# Patient Record
Sex: Male | Born: 1960 | Race: Black or African American | Hispanic: No | Marital: Married | Smoking: Never smoker
Health system: Southern US, Community
[De-identification: ages and names within clinical notes are randomized; demographics above are authoritative.]

## PROBLEM LIST (undated history)

## (undated) DIAGNOSIS — M1990 Unspecified osteoarthritis, unspecified site: Secondary | ICD-10-CM

## (undated) DIAGNOSIS — N429 Disorder of prostate, unspecified: Secondary | ICD-10-CM

## (undated) DIAGNOSIS — I10 Essential (primary) hypertension: Secondary | ICD-10-CM

## (undated) DIAGNOSIS — B351 Tinea unguium: Secondary | ICD-10-CM

## (undated) DIAGNOSIS — F1410 Cocaine abuse, uncomplicated: Secondary | ICD-10-CM

## (undated) DIAGNOSIS — E785 Hyperlipidemia, unspecified: Secondary | ICD-10-CM

## (undated) DIAGNOSIS — I5189 Other ill-defined heart diseases: Secondary | ICD-10-CM

## (undated) DIAGNOSIS — B360 Pityriasis versicolor: Secondary | ICD-10-CM

## (undated) DIAGNOSIS — I629 Nontraumatic intracranial hemorrhage, unspecified: Secondary | ICD-10-CM

## (undated) DIAGNOSIS — N529 Male erectile dysfunction, unspecified: Secondary | ICD-10-CM

## (undated) HISTORY — DX: Tinea unguium: B35.1

## (undated) HISTORY — DX: Essential (primary) hypertension: I10

## (undated) HISTORY — DX: Male erectile dysfunction, unspecified: N52.9

## (undated) HISTORY — DX: Disorder of prostate, unspecified: N42.9

## (undated) HISTORY — DX: Pityriasis versicolor: B36.0

## (undated) HISTORY — DX: Cocaine abuse, uncomplicated: F14.10

## (undated) HISTORY — DX: Other ill-defined heart diseases: I51.89

## (undated) HISTORY — DX: Hyperlipidemia, unspecified: E78.5

## (undated) HISTORY — DX: Nontraumatic intracranial hemorrhage, unspecified: I62.9

---

## 1998-02-12 ENCOUNTER — Encounter
Admission: RE | Admit: 1998-02-12 | Discharge: 1998-05-13 | Payer: Self-pay | Admitting: Physical Medicine and Rehabilitation

## 1998-03-20 ENCOUNTER — Encounter: Admission: RE | Admit: 1998-03-20 | Discharge: 1998-03-20 | Payer: Self-pay | Admitting: Internal Medicine

## 1999-01-14 ENCOUNTER — Encounter
Admission: RE | Admit: 1999-01-14 | Discharge: 1999-02-11 | Payer: Self-pay | Admitting: Physical Medicine and Rehabilitation

## 1999-10-07 ENCOUNTER — Emergency Department (HOSPITAL_COMMUNITY): Admission: EM | Admit: 1999-10-07 | Discharge: 1999-10-07 | Payer: Self-pay | Admitting: Emergency Medicine

## 1999-10-08 ENCOUNTER — Encounter: Payer: Self-pay | Admitting: Emergency Medicine

## 1999-10-12 ENCOUNTER — Encounter: Admission: RE | Admit: 1999-10-12 | Discharge: 1999-10-12 | Payer: Self-pay | Admitting: Hematology and Oncology

## 2000-04-09 ENCOUNTER — Encounter: Payer: Self-pay | Admitting: Emergency Medicine

## 2000-04-09 ENCOUNTER — Emergency Department (HOSPITAL_COMMUNITY): Admission: EM | Admit: 2000-04-09 | Discharge: 2000-04-09 | Payer: Self-pay | Admitting: Emergency Medicine

## 2001-01-11 ENCOUNTER — Encounter: Admission: RE | Admit: 2001-01-11 | Discharge: 2001-01-11 | Payer: Self-pay | Admitting: Hematology and Oncology

## 2001-02-08 ENCOUNTER — Encounter: Admission: RE | Admit: 2001-02-08 | Discharge: 2001-02-08 | Payer: Self-pay | Admitting: Internal Medicine

## 2001-03-06 ENCOUNTER — Encounter: Admission: RE | Admit: 2001-03-06 | Discharge: 2001-03-06 | Payer: Self-pay | Admitting: Hematology and Oncology

## 2001-09-28 ENCOUNTER — Encounter: Admission: RE | Admit: 2001-09-28 | Discharge: 2001-09-28 | Payer: Self-pay | Admitting: Internal Medicine

## 2001-10-05 ENCOUNTER — Encounter: Admission: RE | Admit: 2001-10-05 | Discharge: 2001-10-05 | Payer: Self-pay | Admitting: Internal Medicine

## 2001-11-05 ENCOUNTER — Encounter: Admission: RE | Admit: 2001-11-05 | Discharge: 2001-11-05 | Payer: Self-pay | Admitting: Internal Medicine

## 2002-02-12 DIAGNOSIS — B351 Tinea unguium: Secondary | ICD-10-CM

## 2002-02-12 HISTORY — DX: Tinea unguium: B35.1

## 2002-04-11 ENCOUNTER — Encounter: Admission: RE | Admit: 2002-04-11 | Discharge: 2002-04-11 | Payer: Self-pay | Admitting: Internal Medicine

## 2002-04-19 ENCOUNTER — Encounter: Admission: RE | Admit: 2002-04-19 | Discharge: 2002-04-19 | Payer: Self-pay | Admitting: Internal Medicine

## 2002-04-26 ENCOUNTER — Encounter: Admission: RE | Admit: 2002-04-26 | Discharge: 2002-04-26 | Payer: Self-pay | Admitting: Internal Medicine

## 2002-12-05 ENCOUNTER — Encounter: Admission: RE | Admit: 2002-12-05 | Discharge: 2002-12-05 | Payer: Self-pay | Admitting: Internal Medicine

## 2004-07-10 ENCOUNTER — Emergency Department (HOSPITAL_COMMUNITY): Admission: EM | Admit: 2004-07-10 | Discharge: 2004-07-10 | Payer: Self-pay | Admitting: Emergency Medicine

## 2004-07-10 ENCOUNTER — Ambulatory Visit (HOSPITAL_COMMUNITY): Admission: RE | Admit: 2004-07-10 | Discharge: 2004-07-10 | Payer: Self-pay | Admitting: Emergency Medicine

## 2004-07-27 ENCOUNTER — Ambulatory Visit: Payer: Self-pay | Admitting: Internal Medicine

## 2004-11-24 ENCOUNTER — Ambulatory Visit: Payer: Self-pay | Admitting: Internal Medicine

## 2004-12-08 ENCOUNTER — Ambulatory Visit: Payer: Self-pay | Admitting: Internal Medicine

## 2004-12-16 ENCOUNTER — Ambulatory Visit: Payer: Self-pay | Admitting: Internal Medicine

## 2004-12-23 ENCOUNTER — Encounter: Admission: RE | Admit: 2004-12-23 | Discharge: 2005-03-23 | Payer: Self-pay | Admitting: Surgery

## 2005-02-10 ENCOUNTER — Ambulatory Visit: Payer: Self-pay | Admitting: Internal Medicine

## 2005-10-13 ENCOUNTER — Ambulatory Visit (HOSPITAL_COMMUNITY): Admission: RE | Admit: 2005-10-13 | Discharge: 2005-10-13 | Payer: Self-pay | Admitting: Internal Medicine

## 2005-10-13 ENCOUNTER — Ambulatory Visit: Payer: Self-pay | Admitting: Hospitalist

## 2005-10-27 ENCOUNTER — Ambulatory Visit: Payer: Self-pay | Admitting: Internal Medicine

## 2005-11-25 ENCOUNTER — Encounter (INDEPENDENT_AMBULATORY_CARE_PROVIDER_SITE_OTHER): Payer: Self-pay | Admitting: *Deleted

## 2006-02-09 ENCOUNTER — Ambulatory Visit: Payer: Self-pay | Admitting: Hospitalist

## 2006-04-04 ENCOUNTER — Ambulatory Visit: Payer: Self-pay | Admitting: Internal Medicine

## 2006-09-18 DIAGNOSIS — I10 Essential (primary) hypertension: Secondary | ICD-10-CM

## 2006-09-18 DIAGNOSIS — E785 Hyperlipidemia, unspecified: Secondary | ICD-10-CM

## 2006-09-19 DIAGNOSIS — F528 Other sexual dysfunction not due to a substance or known physiological condition: Secondary | ICD-10-CM

## 2006-09-19 DIAGNOSIS — B360 Pityriasis versicolor: Secondary | ICD-10-CM

## 2006-09-19 DIAGNOSIS — I629 Nontraumatic intracranial hemorrhage, unspecified: Secondary | ICD-10-CM | POA: Insufficient documentation

## 2006-09-19 DIAGNOSIS — R079 Chest pain, unspecified: Secondary | ICD-10-CM

## 2006-09-19 DIAGNOSIS — Z9189 Other specified personal risk factors, not elsewhere classified: Secondary | ICD-10-CM | POA: Insufficient documentation

## 2006-09-19 DIAGNOSIS — B351 Tinea unguium: Secondary | ICD-10-CM

## 2007-01-07 ENCOUNTER — Inpatient Hospital Stay (HOSPITAL_COMMUNITY): Admission: EM | Admit: 2007-01-07 | Discharge: 2007-01-09 | Payer: Self-pay | Admitting: Family Medicine

## 2007-01-15 ENCOUNTER — Telehealth: Payer: Self-pay | Admitting: *Deleted

## 2007-04-27 ENCOUNTER — Encounter (INDEPENDENT_AMBULATORY_CARE_PROVIDER_SITE_OTHER): Payer: Self-pay | Admitting: Internal Medicine

## 2007-04-27 ENCOUNTER — Telehealth: Payer: Self-pay | Admitting: *Deleted

## 2007-04-27 ENCOUNTER — Ambulatory Visit: Payer: Self-pay | Admitting: Internal Medicine

## 2007-04-27 DIAGNOSIS — G243 Spasmodic torticollis: Secondary | ICD-10-CM

## 2007-04-27 LAB — CONVERTED CEMR LAB
AST: 21 units/L (ref 0–37)
Albumin: 4 g/dL (ref 3.5–5.2)
Calcium: 9.8 mg/dL (ref 8.4–10.5)
MCHC: 32.2 g/dL (ref 30.0–36.0)
MCV: 72.6 fL — ABNORMAL LOW (ref 78.0–100.0)
Platelets: 270 10*3/uL (ref 150–400)
RBC: 5.62 M/uL (ref 4.22–5.81)
Sodium: 139 meq/L (ref 135–145)
Total Protein: 7.5 g/dL (ref 6.0–8.3)
WBC: 6.6 10*3/uL (ref 4.0–10.5)

## 2007-04-28 ENCOUNTER — Emergency Department (HOSPITAL_COMMUNITY): Admission: EM | Admit: 2007-04-28 | Discharge: 2007-04-29 | Payer: Self-pay | Admitting: Emergency Medicine

## 2007-12-18 ENCOUNTER — Ambulatory Visit: Payer: Self-pay | Admitting: Hospitalist

## 2007-12-18 DIAGNOSIS — R718 Other abnormality of red blood cells: Secondary | ICD-10-CM

## 2007-12-18 LAB — CONVERTED CEMR LAB
MCV: 70 fL — ABNORMAL LOW (ref 78.0–100.0)
Platelets: 233 10*3/uL (ref 150–400)
Prolactin: 7.8 ng/mL (ref 2.1–17.1)
RBC: 5.76 M/uL (ref 4.22–5.81)
TSH: 0.594 microintl units/mL (ref 0.350–5.50)
WBC: 4.8 10*3/uL (ref 4.0–10.5)

## 2007-12-19 ENCOUNTER — Encounter (INDEPENDENT_AMBULATORY_CARE_PROVIDER_SITE_OTHER): Payer: Self-pay | Admitting: Hospitalist

## 2007-12-19 LAB — CONVERTED CEMR LAB: PSA: 0.76 ng/mL (ref 0.10–4.00)

## 2008-01-03 ENCOUNTER — Telehealth: Payer: Self-pay | Admitting: *Deleted

## 2008-05-13 ENCOUNTER — Encounter: Payer: Self-pay | Admitting: Licensed Clinical Social Worker

## 2008-05-22 ENCOUNTER — Encounter (INDEPENDENT_AMBULATORY_CARE_PROVIDER_SITE_OTHER): Payer: Self-pay | Admitting: *Deleted

## 2008-05-23 ENCOUNTER — Ambulatory Visit: Payer: Self-pay | Admitting: *Deleted

## 2008-05-23 ENCOUNTER — Ambulatory Visit (HOSPITAL_COMMUNITY): Admission: RE | Admit: 2008-05-23 | Discharge: 2008-05-23 | Payer: Self-pay | Admitting: *Deleted

## 2008-05-23 DIAGNOSIS — M79609 Pain in unspecified limb: Secondary | ICD-10-CM

## 2008-05-25 LAB — CONVERTED CEMR LAB
Cholesterol: 293 mg/dL — ABNORMAL HIGH (ref 0–200)
HDL: 55 mg/dL (ref >39)
Total CHOL/HDL Ratio: 5.3

## 2008-07-15 ENCOUNTER — Encounter (INDEPENDENT_AMBULATORY_CARE_PROVIDER_SITE_OTHER): Payer: Self-pay | Admitting: *Deleted

## 2008-12-16 ENCOUNTER — Encounter (INDEPENDENT_AMBULATORY_CARE_PROVIDER_SITE_OTHER): Payer: Self-pay | Admitting: *Deleted

## 2008-12-16 ENCOUNTER — Ambulatory Visit: Payer: Self-pay | Admitting: Internal Medicine

## 2008-12-16 DIAGNOSIS — L259 Unspecified contact dermatitis, unspecified cause: Secondary | ICD-10-CM | POA: Insufficient documentation

## 2008-12-16 LAB — CONVERTED CEMR LAB
Alkaline Phosphatase: 76 units/L (ref 39–117)
Glucose, Bld: 89 mg/dL (ref 70–99)
Potassium: 3.8 meq/L (ref 3.5–5.3)
Sodium: 139 meq/L (ref 135–145)

## 2008-12-31 ENCOUNTER — Encounter (INDEPENDENT_AMBULATORY_CARE_PROVIDER_SITE_OTHER): Payer: Self-pay | Admitting: *Deleted

## 2009-08-06 ENCOUNTER — Telehealth (INDEPENDENT_AMBULATORY_CARE_PROVIDER_SITE_OTHER): Payer: Self-pay | Admitting: *Deleted

## 2009-08-11 ENCOUNTER — Telehealth (INDEPENDENT_AMBULATORY_CARE_PROVIDER_SITE_OTHER): Payer: Self-pay | Admitting: *Deleted

## 2009-09-07 ENCOUNTER — Telehealth: Payer: Self-pay | Admitting: Internal Medicine

## 2010-03-03 ENCOUNTER — Ambulatory Visit: Payer: Self-pay | Admitting: Internal Medicine

## 2010-03-09 ENCOUNTER — Ambulatory Visit: Payer: Self-pay | Admitting: Internal Medicine

## 2010-04-02 ENCOUNTER — Encounter: Payer: Self-pay | Admitting: Internal Medicine

## 2010-11-10 ENCOUNTER — Telehealth: Payer: Self-pay | Admitting: Internal Medicine

## 2010-12-16 NOTE — Assessment & Plan Note (Signed)
Summary: RA/NEEDS ROUTINE CHECKUP/CH   Vital Signs:  Patient profile:   50 year old male Height:      65 inches (165.10 cm) Weight:      180.4 pounds (82 kg) BMI:     30.13 Temp:     98.5 degrees F (36.94 degrees C) Pulse rate:   76 / minute BP sitting:   151 / 110  (right arm) Cuff size:   regular  Vitals Entered By: Dorie Rank RN (March 03, 2010 3:12 PM)  Serial Vital Signs/Assessments:  Time      Position  BP       Pulse  Resp  Temp     By 3:18 PM   R Arm     153/101                        Dorie Rank RN  Comments: 3:18 PM large cuff By: Dorie Rank RN   CC: out of b/p meds for 2 days or more- noted light circular area on top of head - noticed last week when cut head Is Patient Diabetic? No Pain Assessment Patient in pain? yes     Location: lower back Intensity: 2 Type: dull Onset of pain  at least one month - change in position seems to aggravate pain Nutritional Status BMI of > 30 = obese  Does patient need assistance? Functional Status Self care Ambulation Normal n  Primary Care Provider:  Eliseo Gum MD  CC:  out of b/p meds for 2 days or more- noted light circular area on top of head - noticed last week when cut head.  History of Present Illness: 50 yo m with h/o drug abuse, HTN, s/p stroke, HLD, erectile dysfunction, presents to [facility] for regular follow up appointment. He has no concerns at the time. No recent sicknesses or hospitalizaitons. No episodes of chest pain, SOB, palpitations, no fever, chills. No specific abdominal or urinary concerns. No recent changes in appetite, weight, sleep patterns, mood. He has not been taking his BP medicine because he can not afford it.        Preventive Screening-Counseling & Management  Alcohol-Tobacco     Alcohol type: beer ( at times)     Smoking Status: quit     Year Quit: 1999  Problems Prior to Update: 1)  Dermatitis  (ICD-692.9) 2)  Toe Pain  (ICD-729.5) 3)  Microcytosis   (ICD-790.09) 4)  Torticollis, Spasmodic  (ICD-333.83) 5)  Tinea Versicolor  (ICD-111.0) 6)  Chest Pain  (ICD-786.50) 7)  Drug Abuse, Hx of  (ICD-V15.89) 8)  Onychomycosis  (ICD-110.1) 9)  Erectile Dysfunction  (ICD-302.72) 10)  Intracranial Hemorrhage  (ICD-432.9) 11)  Hypertension  (ICD-401.9) 12)  Hyperlipidemia  (ICD-272.4)  Medications Prior to Update: 1)  Lisinopril 40 Mg Tabs (Lisinopril) .... Take One Tablet By Mouth Daily 2)  Hydrochlorothiazide 25 Mg Tabs (Hydrochlorothiazide) .... Take One Tablet By Mouth Daily 3)  Norvasc 10 Mg Tabs (Amlodipine Besylate) .... Take 1 Tablet By Mouth Once A Day 4)  Cialis 20 Mg  Tabs (Tadalafil) .... Take One Tablet At Least 30 Minutes Before Activity. Do Not Take More Than One Tablet Daily. One Tablet Can Last For Up To 36 Hours. 5)  Pravastatin Sodium 20 Mg  Tabs (Pravastatin Sodium) .... Take 1 Tablet By Mouth Once A Day 6)  Eq Ibuprofen 200 Mg  Tabs (Ibuprofen) .... Take 3-4 Tablets By Mouth As Needed For Pain  Current Medications (  verified): 1)  Lisinopril 40 Mg Tabs (Lisinopril) .... Take One Tablet By Mouth Daily 2)  Hydrochlorothiazide 25 Mg Tabs (Hydrochlorothiazide) .... Take One Tablet By Mouth Daily 3)  Norvasc 10 Mg Tabs (Amlodipine Besylate) .... Take 1 Tablet By Mouth Once A Day 4)  Cialis 20 Mg  Tabs (Tadalafil) .... Take One Tablet At Least 30 Minutes Before Activity. Do Not Take More Than One Tablet Daily. One Tablet Can Last For Up To 36 Hours. 5)  Pravastatin Sodium 20 Mg  Tabs (Pravastatin Sodium) .... Take 1 Tablet By Mouth Once A Day 6)  Eq Ibuprofen 200 Mg  Tabs (Ibuprofen) .... Take 3-4 Tablets By Mouth As Needed For Pain  Allergies (verified): No Known Drug Allergies  Past History:  Past Medical History: Last updated: 12/16/2008 Hyperlipidemia-     LDL 226 7/ 2009    LDL 204 December  2006 Elevated transaminases on statin?- 40's (2/06)   - Hep B/C neg 4/06; Resolution on 4/06 labs. Off statin in 2006. Tinea  versicolor- Derm referral after no response to selenium sulfide. 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 Aprill 2003 Chest pain- with a negative adenosine Cardiolite in December 2006. Dr. Robet Leu.Ttinea versicolor- dermatology referral secondary to no treatment response to selenium sulfide. currently resolved. Prostate irregularity w/ obstructive voiding sx/smptoms (PSA 0.67)   - Likely related to overstimulaton of alpha receptors at bladder neck   - No interest in cystocopy   - Seen by Dr. Wanda Plump 9/05. No BPH on that visit.  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  Risk Factors: Smoking Status: quit (03/03/2010)  Social History: Reviewed history from 12/16/2008 and no changes required. 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  Review of Systems       Per HPI  Physical Exam  General:  alert, well-developed, and well-nourished.   Neck:  no  JVD Lungs:  normal respiratory effort, no crackles, and no wheezes.   Heart:  normal rate, regular rhythm, and no murmur.   Abdomen:  soft, non-tender, normal bowel sounds, no distention, no masses, and no guarding.   Msk:  normal ROM, no joint tenderness, no joint swelling, no joint warmth, no redness over joints, no joint deformities, and no joint instability.   Neurologic:  Left hemiparesis from previous CVA. Gait abnormal with foot drop on L.alert & oriented X3 and cranial nerves II-XII intact.     Impression & Recommendations:  Problem # 1:  ERECTILE DYSFUNCTION (ICD-302.72)  Still concerning to him but no sgnificant changes, will continue  the same regimen.  His updated medication list for this problem includes:    Cialis 20 Mg Tabs (Tadalafil) .Marland Kitchen... Take one tablet at least 30 minutes before activity. do not take more than one tablet daily. one tablet can last for up to 36 hours.  Discussed proper use of medications, as well as side effects.   Problem # 2:  HYPERTENSION (ICD-401.9) Uncontrolled secondary to medical noncompliance. I have explained to him that all of the meds are on $4 drug list and he asail he can afford that. I also explained to him that if his BPis well controlled provided he is compliant maybe we can simplify the regimen and make it cheaper for him. He has agreed to take meds as recommended.  His updated medication list for this problem includes:    Lisinopril 40 Mg Tabs (Lisinopril) .Marland Kitchen... Take one tablet by mouth daily    Hydrochlorothiazide 25 Mg Tabs (Hydrochlorothiazide) .Marland Kitchen... Take one tablet by mouth daily    Norvasc 10 Mg Tabs (Amlodipine besylate) .Marland Kitchen... Take 1 tablet by mouth once a day  BP today: 151/110 Prior BP: 139/93 (12/16/2008)  Labs Reviewed: K+: 3.8 (12/16/2008) Creat: : 1.29 (12/16/2008)   Chol: 229 (12/16/2008)   HDL: 54 (12/16/2008)   LDL: 155 (12/16/2008)   TG: 101 (12/16/2008)  Problem # 3:  HYPERLIPIDEMIA (ICD-272.4) Improveing, will  cont the same regimen.  His updated medication list for this problem includes:    Pravastatin Sodium 20 Mg Tabs (Pravastatin sodium) .Marland Kitchen... Take 1 tablet by mouth once a day  Labs Reviewed: SGOT: 21 (12/16/2008)   SGPT: 30 (12/16/2008)   HDL:54 (12/16/2008), 55 (05/23/2008)  LDL:155 (12/16/2008), 226 (05/23/2008)  Chol:229 (12/16/2008), 293 (05/23/2008)  Trig:101 (12/16/2008), 60 (05/23/2008)  Complete Medication List: 1)  Lisinopril 40 Mg Tabs (Lisinopril) .... Take one tablet by mouth daily 2)  Hydrochlorothiazide 25 Mg Tabs (Hydrochlorothiazide) .... Take one tablet by mouth daily 3)  Norvasc 10 Mg Tabs (Amlodipine besylate) .... Take 1 tablet by mouth once a day 4)  Cialis 20 Mg Tabs (Tadalafil) .... Take one tablet at least 30 minutes before activity. do not take more than one tablet daily. one tablet can last for up to 36 hours. 5)  Pravastatin Sodium 20 Mg Tabs (Pravastatin sodium) .... Take 1 tablet by mouth once a day 6)  Eq Ibuprofen 200 Mg Tabs (Ibuprofen) .... Take 3-4 tablets by mouth as needed for pain  Patient Instructions: 1)  Please schedule a follow-up appointment in 3 months. 2)  Please check your blood pressure regularly, if it is >170 please call clinic at [phone] Prescriptions: EQ IBUPROFEN 200 MG  TABS (IBUPROFEN) take 3-4 tablets by mouth as needed for pain  #90 x 2   Entered and Authorized by:   Mliss Sax MD   Signed by:   Mliss Sax MD on 03/03/2010   Method used:   Electronically to        [facility]       [address]       [place], [place]  [address]       Ph: [phone]       Fax: [phone]   RxID:   [id number] PRAVASTATIN SODIUM 20 MG  TABS (PRAVASTATIN SODIUM) Take 1 tablet by mouth once a day  #30 Tablet x 2   Entered and Authorized by:   Mliss Sax MD   Signed by:   Mliss Sax MD on  03/03/2010   Method used:   Electronically to        [facility]       [address]       [place], [place]  [address]       Ph: [phone]       Fax: [phone]   RxID:   [id number] CIALIS 20 MG  TABS (TADALAFIL) Take one tablet at least 30 minutes before activity. Do not take more than one tablet daily. One tablet can last for up to 36 hours.  #31 x 0   Entered and Authorized by:   Mliss Sax MD   Signed by:   Mliss Sax MD on 03/03/2010   Method used:   Electronically to        [facility]       [address]       [place], [place]  [address]       Ph: [phone]       Fax: [phone]   RxID:   [id number] NORVASC 10 MG TABS (AMLODIPINE BESYLATE) Take 1 tablet by mouth once a day  #31 Tablet x 4   Entered and Authorized by:   Mliss Sax MD   Signed by:   Mliss Sax MD on 03/03/2010   Method used:   Electronically to        [facility])       [address]       [place], [place]  [address]       Ph: [phone]       Fax: [phone]   RxID:   [id number] HYDROCHLOROTHIAZIDE 25 MG TABS (HYDROCHLOROTHIAZIDE) take one tablet by mouth daily  #31 Tablet x 11   Entered and Authorized by:   Mliss Sax MD   Signed by:   Mliss Sax MD on 03/03/2010   Method used:   Electronically to        [facility]       [address]       [place], [place]  [address]       Ph: [phone]       Fax: [phone]   RxID:   [id number] LISINOPRIL 40 MG TABS (LISINOPRIL) take one tablet by mouth daily  #31 Tablet x 4   Entered and Authorized by:   Mliss Sax MD   Signed by:   Mliss Sax MD on 03/03/2010   Method used:   Electronically to        [facility])       [address]       [place], [place]  [address]       Ph: [phone]       Fax: [phone]   RxID:   [id number]    Prevention & Chronic Care Immunizations    Influenza vaccine: Not documented   Influenza vaccine deferral: Not indicated  (03/03/2010)    Tetanus booster: Not documented   Td booster deferral: Not indicated  (03/03/2010)    Pneumococcal vaccine: Not documented  Other Screening   PSA: 0.76  (12/19/2007)   PSA action/deferral: Discussed-PSA declined  (03/03/2010)   Smoking status: quit  (03/03/2010)  Lipids   Total Cholesterol: 229  (  12/16/2008)   Lipid panel action/deferral: Not indicated   LDL: 155  (12/16/2008)   LDL Direct: Not documented   HDL: 54  (12/16/2008)   Triglycerides: 101  (12/16/2008)    SGOT (AST): 21  (12/16/2008)   BMP action: Not indicated   SGPT (ALT): 30  (12/16/2008)   Alkaline phosphatase: 76  (12/16/2008)   Total bilirubin: 0.6  (12/16/2008)    Lipid flowsheet reviewed?: Yes   Progress toward LDL goal: Improved  Hypertension   Last Blood Pressure: 151 / 110  (03/03/2010)   Serum creatinine: 1.29  (12/16/2008)   BMP action: Not indicated   Serum potassium 3.8  (12/16/2008)    Hypertension flowsheet reviewed?: Yes   Progress toward BP goal: Deteriorated  Self-Management Support :   Personal Goals (by the next clinic visit) :      Personal blood pressure goal: 140/90  (03/03/2010)     Personal LDL goal: 100  (03/03/2010)    Patient will work on the following items until the next clinic visit to reach self-care goals:     Medications and monitoring: bring all of my medications to every visit  (03/03/2010)     Eating: eat more vegetables, eat foods that are low in salt, eat baked foods instead of fried foods, limit or avoid alcohol  (03/03/2010)     Activity: take a 30 minute walk every day  (03/03/2010)    Hypertension self-management support: Pre-printed educational material, Written self-care plan, Resources for patients handout  (03/03/2010)   Hypertension self-care plan printed.    Lipid self-management support: Pre-printed educational material, Written self-care plan, Resources  for patients handout  (03/03/2010)   Lipid self-care plan printed.      Resource handout printed.

## 2010-12-16 NOTE — Progress Notes (Signed)
Summary: med refill/gp  Phone Note Refill Request Message from:  Fax from Pharmacy on November 10, 2010 2:07 PM  Refills Requested: Medication #1:  LISINOPRIL 40 MG TABS take one tablet by mouth daily   Last Refilled: 09/12/2010 Last appt. 03/09/10.   Method Requested: Electronic Initial call taken by: Chinita Pester RN,  November 10, 2010 2:07 PM  Follow-up for Phone Call        Refill approved-nurse to complete Follow-up by: Julaine Fusi  DO,  November 11, 2010 1:38 PM    Prescriptions: LISINOPRIL 40 MG TABS (LISINOPRIL) take one tablet by mouth daily  #31 Tablet x 0   Entered and Authorized by:   Julaine Fusi  DO   Signed by:   Julaine Fusi  DO on 11/11/2010   Method used:   Electronically to        [facility]       [address]       [place], [place]  [address]       Ph: [phone]       Fax: [phone]   RxID:   [id number]

## 2010-12-16 NOTE — Assessment & Plan Note (Signed)
Summary: [name] WANTED TO SPEAK W/MD/CH  Comments Has few questions to ask Dr Aldine Contes.   Primary Care Provider:  Eliseo Gum MD   History of Present Illness: 50 yo male with PMH outlined below presents to [facility] for regular follow up appointment. He has no concerns at the time. No recent sicknesses or hospitalizaitons. No episodes of chest pain, SOB, palpitations. No specific abdominal or urinary concerns. No recent changes in appetite, weight, sleep patterns, mood.   He wants to discuss treatment for erectile dysfunction. He has heard about the "post t vac" pump and wants [place] to call the company to find out how he can get it.   Problems Prior to Update: 1)  Dermatitis  (ICD-692.9) 2)  Toe Pain  (ICD-729.5) 3)  Microcytosis  (ICD-790.09) 4)  Torticollis, Spasmodic  (ICD-333.83) 5)  Tinea Versicolor  (ICD-111.0) 6)  Chest Pain  (ICD-786.50) 7)  Drug Abuse, Hx of  (ICD-V15.89) 8)  Onychomycosis  (ICD-110.1) 9)  Erectile Dysfunction  (ICD-302.72) 10)  Intracranial Hemorrhage  (ICD-432.9) 11)  Hypertension  (ICD-401.9) 12)  Hyperlipidemia  (ICD-272.4)  Medications Prior to Update: 1)  Lisinopril 40 Mg Tabs (Lisinopril) .... Take One Tablet By Mouth Daily 2)  Hydrochlorothiazide 25 Mg Tabs (Hydrochlorothiazide) .... Take One Tablet By Mouth Daily 3)  Norvasc 10 Mg Tabs (Amlodipine Besylate) .... Take 1 Tablet By Mouth Once A Day 4)  Cialis 20 Mg  Tabs (Tadalafil) .... Take One Tablet At Least 30 Minutes Before Activity. Do Not Take More Than One Tablet Daily. One Tablet Can Last For Up To 36 Hours. 5)  Pravastatin Sodium 20 Mg  Tabs (Pravastatin Sodium) .... Take 1 Tablet By Mouth Once A Day 6)  Eq Ibuprofen 200 Mg  Tabs (Ibuprofen) .... Take 3-4 Tablets By Mouth As Needed For Pain  Current Medications (verified): 1)  Lisinopril 40 Mg Tabs (Lisinopril) .... Take One Tablet By Mouth Daily 2)  Hydrochlorothiazide 25 Mg Tabs (Hydrochlorothiazide) .... Take One Tablet By Mouth Daily 3)   Norvasc 10 Mg Tabs (Amlodipine Besylate) .... Take 1 Tablet By Mouth Once A Day 4)  Cialis 20 Mg  Tabs (Tadalafil) .... Take One Tablet At Least 30 Minutes Before Activity. Do Not Take More Than One Tablet Daily. One Tablet Can Last For Up To 36 Hours. 5)  Pravastatin Sodium 20 Mg  Tabs (Pravastatin Sodium) .... Take 1 Tablet By Mouth Once A Day 6)  Eq Ibuprofen 200 Mg  Tabs (Ibuprofen) .... Take 3-4 Tablets By Mouth As Needed For Pain  Allergies (verified): No Known Drug Allergies  Past History:  Past Medical History: Last updated: 12/16/2008 Hyperlipidemia-     LDL 226 7/ 2009    LDL 204 December  2006 Elevated transaminases on statin?- 40's (2/06)   - Hep B/C neg 4/06; Resolution on 4/06 labs. Off statin in 2006. Tinea versicolor- Derm referral after no response to selenium sulfide. 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 Aprill 2003 Chest pain- with a negative adenosine Cardiolite in December 2006. Dr. Robet Leu.Ttinea versicolor- dermatology referral secondary to no treatment response to selenium sulfide. currently resolved. Prostate irregularity w/ obstructive voiding sx/smptoms (PSA 0.67)   - Likely related to overstimulaton of alpha receptors at bladder neck   - No interest in cystocopy   - Seen by Dr. Wanda Plump 9/05. No BPH on that visit.  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  Risk Factors: Smoking Status: quit (03/03/2010)  Social History: Reviewed history from 12/16/2008 and no changes required. 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  Review of Systems       per HPI  Physical Exam  General:  Well-developed,well-nourished,in no acute distress; alert,appropriate and cooperative throughout examination Lungs:  Normal respiratory effort, chest expands symmetrically. Lungs are clear to auscultation, no crackles or wheezes. Heart:  Normal rate and regular rhythm. S1 and S2 normal without gallop, murmur, click, rub or other extra sounds. Psych:  Cognition and judgment appear intact. Alert and cooperative with normal attention span and concentration. No apparent delusions, illusions, hallucinations   Impression & Recommendations:  Problem # 1:  ERECTILE DYSFUNCTION (ICD-302.72) Will call teh company at [phone] and will find out wnat pts needs to do. Cont medical management. His updated medication list for this problem includes:    Cialis 20 Mg Tabs (Tadalafil) .Marland Kitchen... Take one tablet at least 30 minutes before activity. do not take more than one tablet daily. one tablet can last for up to 36 hours.  Complete Medication List: 1)  Lisinopril 40 Mg Tabs (Lisinopril) .... Take one tablet by mouth daily 2)  Hydrochlorothiazide 25 Mg Tabs (Hydrochlorothiazide) .... Take one tablet by mouth daily 3)  Norvasc 10 Mg Tabs (Amlodipine besylate) .... Take 1 tablet by mouth once a day 4)  Cialis 20 Mg Tabs (Tadalafil)  .... Take one tablet at least 30 minutes before activity. do not take more than one tablet daily. one tablet can last for up to 36 hours. 5)  Pravastatin Sodium 20 Mg Tabs (Pravastatin sodium) .... Take 1 tablet by mouth once a day 6)  Eq Ibuprofen 200 Mg Tabs (Ibuprofen) .... Take 3-4 tablets by mouth as needed for pain  Patient Instructions: 1)  Please schedule a follow-up appointment in 3 months.

## 2010-12-16 NOTE — Medication Information (Signed)
Summary: POS-T-VAC  POS-T-VAC   Imported By: Margie Billet 04/06/2010 11:30:00  _____________________________________________________________________  External Attachment:    Type:   Image     Comment:   External Document

## 2011-01-20 ENCOUNTER — Encounter: Payer: Self-pay | Admitting: Internal Medicine

## 2011-01-23 ENCOUNTER — Other Ambulatory Visit: Payer: Self-pay | Admitting: Internal Medicine

## 2011-04-01 NOTE — Discharge Summary (Signed)
NAMEJASIRI, [name]                ACCOUNT NO.:  [id number]   MEDICAL RECORD NO.:  [id number]          PATIENT TYPE:  INP   LOCATION:  5506                         FACILITY:  [facility]   PHYSICIAN:  Pramod P. Pearlean Brownie, MD    DATE OF BIRTH:  [birth year]   DATE OF ADMISSION:  01/07/2007  DATE OF DISCHARGE:  01/09/2007                               DISCHARGE SUMMARY   DIAGNOSIS AT TIME OF DISCHARGE:  1. Left putaminal hemorrhage with dysarthria.  2. 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  3. Hypertension on admission, now normalized.  4. Dyslipidemia.   MEDICINES AT TIME OF DISCHARGE:  1. Lisinopril 40 mg a day.  2. Hydrochlorothiazide 25 mg a day.  3. Amlodipine 10 mg a day.  4. No aspirin or aspirin-containing products.  5. Zocor 40 mg a day.   STUDIES PERFORMED:  1. CT of the brain on admission shows 1 x 2.5-cm left basal ganglia      external capsule hematoma.  Right basal ganglia MCA infarct.      Chronic small vessel white matter disease.  2. MRI of the brain shows 3.4-cm x 1.5-cm focal area of hyperintense      signal in the left putamen and posteriorly with a focus of      hemorrhage.  Old ischemic infarcts involving the right basal      ganglia region and left anterior putamen as described earlier in      CT.  Probable small vessel type disease changes involving the      supratentorial compartment and also the right paramedian pons      anteriorly.  Secondary atrophy, right greater than left, with      wallerian degeneration of the right cerebral peduncle.  Moderate      sinusitis changes in the ethmoid, the frontal sinuses on the right      and also the maxillary sinuses.  MRI of the head shows no      occlusion, stenosis, infections or aneurysms.  No gross AVM      suggested.  3. MRA of the neck shows no occlusion, stenosis, dissection or      pseudoaneurysm.  4. Carotid Doppler shows no ICA stenosis.  5. TCD performed, results pending.  6. 2D echocardiogram performed, results  pending.  7. EKG shows possible left atrial enlargement, borderline SAG.   LABORATORY STUDIES:  Shows CBC with MCV 71.9, RBC 5.98, RDW 14.7,  otherwise CBC normal.  Chemistry normal.  Coagulation studies normal.  Liver function tests normal.  Cholesterol 271, HDL 47, LDL 202,  triglycerides 108.  Troponins are 0.01.  CK 230 and CK MB 1.7.  Urinalysis normal.  Homocysteine pending.  Urine drug screen negative.  Alcohol level less than 5.  Hemoglobin A1c 5.9.   HISTORY OF PRESENT ILLNESS:  [name] is a 50 year old, right-  handed, black male with a history of prior right brain stroke with left  sided weakness occurring in 1999 with history of significant  hypertension.  The patient has been on aspirin in the past, but has not  been taking it regularly.  The patient comes to [facility] for evaluation of gait disturbance, slurred speech and drooling,  and cough after swallowing that began the day of admission and that was  noted after arising from bed.  Patient awoke with symptoms and was not a  TPA candidate.  He has persistent slurred speech though some of his  weakness is improving.  He has a NIH stroke scale of 2.  He is admitted  to the hospital for further workup.   CT of the brain showed acute hemorrhage in the right brain likely  related to hypertension that was elevated in the emergency room at  165/101.  It has normalized during hospitalization.  He will be sent  home on his same antihypertensives.  Of note, patient has significant  dyslipidemia for which he was started on Statin and then withdrawn from  Statin secondary to it being removed from the market.  He was never  restarted on another Statin.  Will prescribe Zocor at time of discharge.  Patient also had taken a significantly high salt diet.  Dietary  counseling provided by myself.  Plans are to resume aspirin in 3 to 4  weeks and follow up with Dr. Pearlean Brownie in 2 months.   CONDITION AT  DISCHARGE:  Patient alert and oriented x3.  Speech  dysarthric with significant left facial weakness.  He is alert and  oriented x3 and has no aphasia.  His extraocular movements are intact.  His visual fields are full.  He has no drift in his upper extremity, but  has 4 out of 5 strength in his arm and leg.  He has decreased fine motor  movement on the left, has increased tone on the left side, decreased  sensation on the left side.   DISCHARGE PLAN:  1. Discharge home with family.  2. New Statin.  3. Low salt diet.  4. Follow up with regular physician within 1 month for cholesterol and      blood pressure check.  5. Follow up with Dr. Pearlean Brownie in 2 to 3 months.  6. Resume aspirin in 1 month.  7. Patient interested in this therapy, considered as an outpatient.      Annie Main, N.P.    ______________________________  Sunny Schlein. Pearlean Brownie, MD    SB/MEDQ  D:  01/09/2007  T:  01/10/2007  Job:  [id number]   cc:   Pramod P. Pearlean Brownie, MD  Dr. Okey Dupre

## 2011-04-01 NOTE — H&P (Signed)
NAMEWILFREDO, Blackburn                ACCOUNT NO.:  [id number]   MEDICAL RECORD NO.:  [id number]          PATIENT TYPE:  INP   LOCATION:  5506                         FACILITY:  [facility]   PHYSICIAN:  Marlan Palau, M.D.  DATE OF BIRTH:  1961-02-10   DATE OF ADMISSION:  01/07/2007  DATE OF DISCHARGE:                              HISTORY & PHYSICAL   HISTORY OF PRESENT ILLNESS:  [name] is a 50 year old right-  handed black male with a history of prior right brain stroke with left-  sided weakness occurring in 1999 with history of significant  hypertension.  This patient has been on aspirin in the past abut has not  been taking it regularly.  The patient comes to the [facility] for an evaluation of onset of gait disturbances, slurred  speech, drooling, coughing after swallowing that began today and was  noted immediately after arising from bed.  Patient was known normal  around bedtime around 1 a.m. on the January 07, 2007.  Patient reports  no numbness.  No headaches and no visual disturbances, double vision or  loss of vision.  Patient again has persistent slurred speech.  Denies  any new weakness of extremities but the balance is off.  [facility].  This is  subsequently cancelled.  NIH stoke scale is 2.   PAST MEDICAL HISTORY:  1. Significant for new onset slurred speech.  2. Gait disorder.  3. Right brain stoke in 1999 with left hemiparesis.  4. History of hypertension.  5. Dyslipidemia.  Not on medications.   MEDICATIONS:  1. Norvasc 10 mg daily.  2. Lisinopril 40 mg daily.  3. Hydrochlorothiazide 25 mg daily.   ALLERGIES:  No known allergies.   SOCIAL HISTORY:  Does not smoke.  Drink alcohol on occasional.  This  patient is married.  Lives in the [place] area.  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  Patient has  2 children, both sons, alive  and well.   FAMILY HISTORY:  Father died of alcoholism.  Mother is alive.  Has  history of low back pain.  Has 4 brothers.  One brother has heart  disease.  No family history cancer or diabetes noted.   REVIEW OF SYSTEMS:  No recent fevers or chills.  Patient denies  headache, neck pain.  Denies shortness of breath, chest pain,  palpitations of the heart.  Denies abdominal pain, nausea, vomiting.  Denies any problems controlling the bowels or bladder.  Has urinary  frequency on diuretics.  No blackouts in the past.   PHYSICAL EXAMINATION:  VITALS:  Blood pressure is 165/101.  Diastolic  has been as high as 604.  The respiratory rate 14.  Pulse 94.  Temperature afebrile.  GENERAL:  This patient is a fairly well-developed back male who is alert  and cooperative at time of examination.  HEENT:  Head is atraumatic.  Pupils equal round and reactive to light.  Disks off bilaterally.  NECK:  Supple.  No carotid bruits noted.  RESPIRATORY EXAMINATION:  Clear.  CARDIOVASCULAR EXAMINATION:  Reveals regular rate and rhythm.  No  obvious murmurs or rubs noted.  ABDOMEN:  Reveals positive bowel sounds.  No organomegaly or tenderness  noted.  EXTREMITIES:  Were without significant edema.  NEUROLOGIC EXAMINATION:  Cranial nerves as above.  Facial asymmetry is  relatively intact.  The patient has a good sensation to face on the  right side to pin prick.  Decreased on the left.  Patient has good  strength in the muscles and the muscles  contracts bilaterally.  Extraocular movements are full and mild.  Dysarthria is noted.  No  aphasia is noted.  Motor test reveals 4-5 strength of intrinsic muscles  of left hand.  Patient has fisting of the left hand with normal  posturing.  Patient otherwise has good strength on both upper  extremities and lower extremities.  Patient has decreased pinprick  sensation on the left arm and legs to the right.  Her sensation is  symmetric in her legs but decreased in  the left arm compared to the  right.  Patient has finger-nose-finger bilaterally and heel-to-shin  bilaterally.  Gait was not tested.  Deep tendon reflexes reveal a  relative left sided hyporeflexia in toes.  Neutral bilaterally.  Patient  has no evidence of extinction to double simultaneous stimulation.  CT of  the head again shows chronic ischemia changes of the right middle  cerebral distribution and some generalized small vessel ischemic changes  as well.  No acute disease is noted.   LABORATORY VALUES:  At this time are pending.   IMPRESSION:  1. New onset of gait disturbance, slurred speech, drooling.  Probable      mild dysphasia.  Probable subcortical cerebrovascular event.  2. History of significant  hypertension.  3. Old right brain stroke.  Generalized small vessel disease.   This patient likely suffered a new subcortical stroke event.  NIH stoke  scale is 2.  The patient is not a candidate for TPA due to duration of  symptoms and minimal deficit.  We will admit this patient for further  evaluation if he agrees.  The patient has some hesitation about coming  in the hospital.   PLAN:  1. We will admit patient to the 3000 unit on monitor bed.  2. MRI of the brain.  3. MR angiogram of the intracranial and extracranial vessels.  4. Echocardiogram.  5. Urine drug screen.  6. Blood pressure management.   We will follow patient's clinical course.  Thanks you very much.      Marlan Palau, M.D.  Electronically Signed     CKW/MEDQ  D:  01/07/2007  T:  01/08/2007  Job:  [id number]

## 2011-05-06 ENCOUNTER — Ambulatory Visit (INDEPENDENT_AMBULATORY_CARE_PROVIDER_SITE_OTHER): Payer: Medicare Other | Admitting: Internal Medicine

## 2011-05-06 ENCOUNTER — Encounter: Payer: Self-pay | Admitting: Internal Medicine

## 2011-05-06 ENCOUNTER — Other Ambulatory Visit: Payer: Self-pay | Admitting: Internal Medicine

## 2011-05-06 DIAGNOSIS — I6350 Cerebral infarction due to unspecified occlusion or stenosis of unspecified cerebral artery: Secondary | ICD-10-CM

## 2011-05-06 DIAGNOSIS — I639 Cerebral infarction, unspecified: Secondary | ICD-10-CM

## 2011-05-06 DIAGNOSIS — Z9189 Other specified personal risk factors, not elsewhere classified: Secondary | ICD-10-CM

## 2011-05-06 DIAGNOSIS — E785 Hyperlipidemia, unspecified: Secondary | ICD-10-CM

## 2011-05-06 DIAGNOSIS — Z8673 Personal history of transient ischemic attack (TIA), and cerebral infarction without residual deficits: Secondary | ICD-10-CM | POA: Insufficient documentation

## 2011-05-06 DIAGNOSIS — I10 Essential (primary) hypertension: Secondary | ICD-10-CM

## 2011-05-06 NOTE — Assessment & Plan Note (Signed)
Not using any cocaine for now. Wants to go back to work which I applaud.

## 2011-05-06 NOTE — Assessment & Plan Note (Signed)
Needs to take his medicine before we recheck his ldl. High risk of recurrent stroke. Pt educated about these findings.

## 2011-05-06 NOTE — Progress Notes (Signed)
  Subjective:    Patient ID: [name], male    DOB: 1961/06/18, 50 y.o.   MRN: [id number]  HPI Patient is 50 years old male with past medical history of CVA x2, left-sided weakness presents for evaluation. The patient is requesting a letter assessing his ability to work. 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 However patient wants to work and not want to be on disability for lifelong.please see further details in assessment and plan regarding his ability to work. Next  Patient reports that he has not been taking his blood pressure medication for at least 2 weeks because of financial reasons. He does not have any other complaints. He repeatedly asked me if anything can be done to regain movement in his shoulder and fingers on the left side.  Review of Systems  Constitutional: Negative for fever, chills, activity change and appetite change.  HENT: Negative for nosebleeds, facial swelling, neck pain and tinnitus.   Eyes: Negative for pain, discharge and visual disturbance.  Respiratory: Negative for cough, chest tightness and shortness of breath.   Cardiovascular: Negative for chest pain and palpitations.  Gastrointestinal: Negative for nausea, vomiting, abdominal pain, blood in stool and abdominal distention.  Musculoskeletal: Positive for gait problem.  Skin: Negative for rash.  Neurological: Positive for facial asymmetry and weakness. Negative for dizziness, seizures and headaches.  Psychiatric/Behavioral: Negative for suicidal ideas, confusion and agitation.       Objective:   Physical Exam  Constitutional: He is oriented to person, place, and time. He appears well-developed and well-nourished.  HENT:  Head: Normocephalic and atraumatic.  Right Ear: External ear normal.  Left Ear: External ear normal.  Eyes:  Conjunctivae and EOM are normal. Pupils are equal, round, and reactive to light. Right eye exhibits no discharge. Left eye exhibits no discharge.  Neck: Normal range of motion. Neck supple. No thyromegaly present.  Cardiovascular: Normal rate and regular rhythm.   No murmur heard. Pulmonary/Chest: Effort normal and breath sounds normal. No respiratory distress. He has no wheezes. He has no rales.  Abdominal: Soft. Bowel sounds are normal. He exhibits no distension and no mass. There is no tenderness. There is no rebound and no guarding.  Neurological: He is alert and oriented to person, place, and time. A cranial nerve deficit is present. He exhibits abnormal muscle tone. Coordination abnormal.       Left face assymetry. Left shoulder can not raise arm above the head. All other muscle groups normal in arm and forearm. No ability to control motion in left hand fingers. Left lag also has decreased tone. And decreased strength 4/5 at hip. Other leg muscles are 5/5. Other arm muscles are 5/5.   Skin: No rash noted. He is not diaphoretic. No erythema.  Psychiatric: He has a normal mood and affect. His behavior is normal. Judgment and thought content normal.          Assessment & Plan:

## 2011-05-06 NOTE — Assessment & Plan Note (Signed)
Talked at length about his ability to work. 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 There is no reason why should he remain unemployed or on disability if he does want to work. 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 When I talked to their placement Counsellor, I felt that they simply wanted disability letter so they can get out of federal requirement with waiver of placing him somwhere. I have written letter exactly stating what he can and can not do. 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

## 2011-05-06 NOTE — Assessment & Plan Note (Signed)
Needs to restart his meds. Told him that getting his meds is first thing he needs to do with his money from disability.

## 2011-05-06 NOTE — Patient Instructions (Signed)
Return in one month Restart your BP meds ASAP

## 2011-06-06 ENCOUNTER — Ambulatory Visit (INDEPENDENT_AMBULATORY_CARE_PROVIDER_SITE_OTHER): Payer: Medicare Other | Admitting: Internal Medicine

## 2011-06-06 ENCOUNTER — Ambulatory Visit: Payer: Medicare Other | Admitting: Licensed Clinical Social Worker

## 2011-06-06 ENCOUNTER — Encounter: Payer: Self-pay | Admitting: Internal Medicine

## 2011-06-06 VITALS — BP 136/84 | HR 94 | Temp 98.1°F | Ht 65.0 in | Wt 181.2 lb

## 2011-06-06 DIAGNOSIS — I10 Essential (primary) hypertension: Secondary | ICD-10-CM

## 2011-06-06 DIAGNOSIS — J302 Other seasonal allergic rhinitis: Secondary | ICD-10-CM

## 2011-06-06 DIAGNOSIS — E785 Hyperlipidemia, unspecified: Secondary | ICD-10-CM

## 2011-06-06 DIAGNOSIS — I6930 Unspecified sequelae of cerebral infarction: Secondary | ICD-10-CM

## 2011-06-06 DIAGNOSIS — F528 Other sexual dysfunction not due to a substance or known physiological condition: Secondary | ICD-10-CM

## 2011-06-06 DIAGNOSIS — I639 Cerebral infarction, unspecified: Secondary | ICD-10-CM

## 2011-06-06 DIAGNOSIS — N400 Enlarged prostate without lower urinary tract symptoms: Secondary | ICD-10-CM

## 2011-06-06 DIAGNOSIS — Z0000 Encounter for general adult medical examination without abnormal findings: Secondary | ICD-10-CM

## 2011-06-06 DIAGNOSIS — I6350 Cerebral infarction due to unspecified occlusion or stenosis of unspecified cerebral artery: Secondary | ICD-10-CM

## 2011-06-06 MED ORDER — SILDENAFIL CITRATE 50 MG PO TABS: 50.0000 mg | ORAL_TABLET | ORAL | Status: DC | PRN

## 2011-06-06 MED ORDER — LORATADINE 10 MG PO TABS: 10.0000 mg | ORAL_TABLET | Freq: Every day | ORAL | Status: DC

## 2011-06-06 MED ORDER — DOXAZOSIN MESYLATE 1 MG PO TABS: 1.0000 mg | ORAL_TABLET | Freq: Every day | ORAL | Status: DC

## 2011-06-06 NOTE — Assessment & Plan Note (Signed)
Patient reports prior urologic evaluation, with a diagnosis of BPH, currently untreated, though with chronic frequency, weak stream, and feelings of incomplete voiding -will start doxazosin 1 mg, return for follow-up in 1 month to possibly uptitrate dose -if unsuccessful, will consider referral to urology

## 2011-06-06 NOTE — Assessment & Plan Note (Signed)
History of CVA, with resulting L-sided weakness -patient referred to PT -continue BP and lipid control

## 2011-06-06 NOTE — Assessment & Plan Note (Signed)
Last LDL = 155 (12/16/10), patient currently not fasting, currently not taking pravastatin due to cost and "dry patches on face", currently taking flax seed oil -the importance of good LDL control in preventing future CVA's was discussed at length with the patient, and he agreed that dry skin was a less important problem than a future CVA would be -the patient will return fasting tomorrow for a lipid panel -we will likely need to re-start pravastatin, pending the results of that panel.  I explained to the patient that if it the cost of the medication is an issue, he would be better off stopping the OTC flax seed oil and re-starting the pravastatin.

## 2011-06-06 NOTE — Progress Notes (Signed)
I agree with assessment and plan as per Dr. Manson Passey.

## 2011-06-06 NOTE — Assessment & Plan Note (Signed)
BP currently within goal, with BP = 136/84 -continue amlodipine 10 and lisinopril 40 -since patient has not been taking HCTZ due to increased urination (exacerbated by BPH) and cost of medication, and BP is within goal, we will stop this medication for now, though I explained to the patient that he may need to re-start the medication in the future.

## 2011-06-06 NOTE — Patient Instructions (Addendum)
Your blood pressure is under control today, keep up the good work! -continue taking your amlodipine and lisinopril -you may stop taking the Hydrochlorothiazide medication, though we may need to restart it in the future if your blood pressure rises  Please return tomorrow morning for a cholesterol check -DO NOT EAT anything after midnight tonight, in preparation for the cholesterol check -we will probably need to restart your Pravastatin medication, based on the results  For your frequent urination, start taking Doxazosin, 1 tablet per day  For your seasonal allergies, we have written a prescription for Claritin, take 1 tablet per day only if you are having symptoms  For your erectile dysfunction, we have changed your Cialis to Viagra, which may be cheaper for you.  Please schedule an appointment for Physical Therapy to work on arm and leg strengthening  Please schedule an appointment with Social Work to discuss financial options.  Please return for a follow-up visit in 1 month.

## 2011-06-06 NOTE — Progress Notes (Signed)
Follow-up Visit  HPI The patient is a 50 yo man with a history of HTN, HL, and CVA x2, presenting for a follow-up visit.  At his last visit 1 month ago, the patient's BP was 162/115, and he admits that he hadn't been taking his BP medications due to financial constraints.  For the last month, the patient has re-started his amlodipine and lisinopril, and his BP today was 136/84.  He notes that he did not refill his HCTZ due to financial constraints, and because it made him urinate too frequently both during the day and at night.  His cholesterol was last checked 12/16/10, with an LDL of 155.  The patient admits to not taking his pravastatin, due to financial constraints and because it gave him dry patches of skin on his face, but says he has instead been taking flax seed oil.  He is not fasting today.  He notes a multiple-year history of urinary frequency, nocturia, weak stream, and feelings of incomplete voiding.  He reports seeing a urologist "many years ago", and extensive urinary testing revealed BPH, with no other abnormalities (ie no nerve dysfunction s/p CVA).  He is currently not being treated for BPH>  He also expresses the desire to recover some left-sided functioning, and notes that PT has helped him with strengthening exercises in the past.  ROS: General: no fevers, chills, changes in weight, changes in appetite Skin: no rash HEENT: no blurry vision, hearing changes, sore throat Pulm: no dyspnea, coughing, wheezing CV: no chest pain, palpitations, shortness of breath Abd: no abdominal pain, nausea/vomiting, diarrhea/constipation Ext: no arthralgias, myalgias Neuro: +some left-sided facial weakness, UE and LE weakness  PEX General: alert, cooperative, and in no apparent distress HEENT: pupils equal round and reactive to light, vision grossly intact, oropharynx clear and non-erythematous  Neck: supple, no lymphadenopathy, JVD, or carotid bruits Lungs: clear to ascultation bilaterally,  normal work of respiration, no wheezes, rales, ronchi Heart: regular rate and rhythm, no murmurs, gallops, or rubs Abdomen: soft, non-tender, non-distended, normal bowel sounds Msk: no joint edema, warmth, or erythema Extremities: no cyanosis, clubbing, or edema Neurologic: alert & oriented X3, left facial asymmetry, left shoulder strength 4/5 with restricted abduction past 90 degrees, inability to control motion in left hand, leg hip strength 4/5, otherwise strength 5/5  Assessment/Plan

## 2011-06-06 NOTE — Assessment & Plan Note (Addendum)
-  patient referred to Social Work to help with financial difficulties -patient reports a tetanus shot within the last 3 years through his school

## 2011-06-07 ENCOUNTER — Other Ambulatory Visit: Payer: Medicare Other

## 2011-06-07 DIAGNOSIS — E785 Hyperlipidemia, unspecified: Secondary | ICD-10-CM

## 2011-06-07 LAB — LIPID PANEL
Cholesterol: 270 mg/dL — ABNORMAL HIGH (ref 0–200)
LDL Cholesterol: 196 mg/dL — ABNORMAL HIGH (ref 0–99)
Total CHOL/HDL Ratio: 4.7 Ratio
Triglycerides: 84 mg/dL (ref <150)
VLDL: 17 mg/dL (ref 0–40)

## 2011-06-07 NOTE — Progress Notes (Signed)
30 minutes. Social Work.  Referred by [facility]. Patient looking for resources related to his medications and also trying to find job with [employer] associates degree.  Graduated last month.   Patient has disability on left side related to a stroke suffered in 1999.  He is married and has a 49 year old and a 49 year old who are in area colleges.  His wife works as a Automotive engineer at [employer] and makes between 50,000 to 60,000 per year.   We discussed the [employer] $4 list as an option for him and right now his medication costs are around $75 per month using the store programs.  The family income is not at or near poverty level and so he will not qualify for medication assist programs.   We discussed options for employment including [place], Vocational Rehab, and his school's placement program.  Also discussed taking an internship that may lead to employment in his field.  Called [employer] and obtained recruiter information so he could follow-up on an application he recently submitted.  I've encouraged him to use my name if that will help him get in the door.    Patient was receptive to ideas and follow-up and wants to get back to work to supplement his disability or get off disability.

## 2011-06-09 ENCOUNTER — Other Ambulatory Visit: Payer: Self-pay | Admitting: Internal Medicine

## 2011-06-09 DIAGNOSIS — E785 Hyperlipidemia, unspecified: Secondary | ICD-10-CM

## 2011-06-09 MED ORDER — PRAVASTATIN SODIUM 40 MG PO TABS: 40.0000 mg | ORAL_TABLET | Freq: Every day | ORAL | Status: DC

## 2011-06-10 ENCOUNTER — Telehealth: Payer: Self-pay | Admitting: Internal Medicine

## 2011-06-10 NOTE — Telephone Encounter (Signed)
Patient was called, and informed that he had a high LDL value at his last visit, and that I had called in a prescription for pravastatin to his pharmacy.  Patient expressed full understanding, and stated that he intended to fill this prescription.

## 2011-07-29 ENCOUNTER — Ambulatory Visit: Payer: Medicare Other | Attending: Internal Medicine | Admitting: Physical Therapy

## 2011-07-29 DIAGNOSIS — IMO0001 Reserved for inherently not codable concepts without codable children: Secondary | ICD-10-CM | POA: Insufficient documentation

## 2011-07-29 DIAGNOSIS — R269 Unspecified abnormalities of gait and mobility: Secondary | ICD-10-CM | POA: Insufficient documentation

## 2011-07-29 DIAGNOSIS — I69998 Other sequelae following unspecified cerebrovascular disease: Secondary | ICD-10-CM | POA: Insufficient documentation

## 2011-08-03 ENCOUNTER — Ambulatory Visit: Payer: Medicare Other | Admitting: Physical Therapy

## 2011-08-08 ENCOUNTER — Ambulatory Visit: Payer: Medicare Other | Admitting: Physical Therapy

## 2011-08-11 ENCOUNTER — Ambulatory Visit: Payer: Medicare Other | Admitting: Physical Therapy

## 2011-08-15 ENCOUNTER — Ambulatory Visit: Payer: Medicare Other | Attending: Internal Medicine | Admitting: *Deleted

## 2011-08-15 DIAGNOSIS — I69998 Other sequelae following unspecified cerebrovascular disease: Secondary | ICD-10-CM | POA: Insufficient documentation

## 2011-08-15 DIAGNOSIS — IMO0001 Reserved for inherently not codable concepts without codable children: Secondary | ICD-10-CM | POA: Insufficient documentation

## 2011-08-15 DIAGNOSIS — R269 Unspecified abnormalities of gait and mobility: Secondary | ICD-10-CM | POA: Insufficient documentation

## 2011-08-18 ENCOUNTER — Telehealth: Payer: Self-pay | Admitting: *Deleted

## 2011-08-18 ENCOUNTER — Ambulatory Visit: Payer: Medicare Other | Admitting: Physical Therapy

## 2011-08-18 NOTE — Telephone Encounter (Signed)
Pt stopped by office and appointment given for next Wed. Okay per Dr Coralee Pesa.

## 2011-08-18 NOTE — Telephone Encounter (Signed)
Call from [place], a physical therapist at [facility].  # [id number] Pt has been coming in  for therapy but  Blood Pressure is elevated.  Readings pressure are 9/24 - 138/104, 9/27 - 130/90, 10/1 - 148/104 and today 160/104.  These readings are at rest before therapy. They have been limited as to what they can do with pt because of this.  They usually hold therapy if diastolic BP is above 100.  She is holding therapy until pt is seen in clinic for evaluation of these elevations.     Pt is to call us and get appointment.

## 2011-08-22 ENCOUNTER — Ambulatory Visit: Payer: Medicare Other | Admitting: Physical Therapy

## 2011-08-24 ENCOUNTER — Ambulatory Visit (INDEPENDENT_AMBULATORY_CARE_PROVIDER_SITE_OTHER): Payer: Medicare Other | Admitting: Internal Medicine

## 2011-08-24 ENCOUNTER — Encounter: Payer: Self-pay | Admitting: Internal Medicine

## 2011-08-24 DIAGNOSIS — N400 Enlarged prostate without lower urinary tract symptoms: Secondary | ICD-10-CM

## 2011-08-24 DIAGNOSIS — I10 Essential (primary) hypertension: Secondary | ICD-10-CM

## 2011-08-24 DIAGNOSIS — F528 Other sexual dysfunction not due to a substance or known physiological condition: Secondary | ICD-10-CM

## 2011-08-24 MED ORDER — ATENOLOL 50 MG PO TABS: 50.0000 mg | ORAL_TABLET | Freq: Every day | ORAL | Status: DC

## 2011-08-24 MED ORDER — SILDENAFIL CITRATE 50 MG PO TABS: 50.0000 mg | ORAL_TABLET | ORAL | Status: DC | PRN

## 2011-08-24 MED ORDER — DOXAZOSIN MESYLATE 1 MG PO TABS: 1.0000 mg | ORAL_TABLET | Freq: Every day | ORAL | Status: DC

## 2011-08-24 MED ORDER — HYDROCHLOROTHIAZIDE 25 MG PO TABS: 25.0000 mg | ORAL_TABLET | Freq: Every day | ORAL | Status: DC

## 2011-08-24 NOTE — Progress Notes (Deleted)
  Subjective:    Patient ID: [name], male    DOB: 03-Oct-1961, 50 y.o.   MRN: [id number]  HPI    Review of Systems     Objective:   Physical Exam        Assessment & Plan:

## 2011-08-24 NOTE — Progress Notes (Signed)
Subjective:   Patient ID: [name] male   DOB: 11-Jun-1961 50 y.o.   MRN: [id number]  HPI: [name] is a 50 y.o. male with PMH significant as outlined below who presented to the clinic with elevated BP (169/106)during PT. Patient has long history of non-compliance of medication and was restarted in 05/2011 back on regular medication. Patient reports that he has been taking his medication since then on a daily basis. He started doing PT to improve muscle strength in his left arms and legs but during the session he was noted to have elevated BP and they are not able to complete the full program with him. He noted that he can not understand why this is happening since he is taking his medication and he rides in a care for 30 min and waits 15 mins before he starts the PT.     Past Medical History  Diagnosis Date  . Hyperlipidemia     elevated transaminases on statin?-40's (2/06) - Hep B/C negative 4/06; resolution on 4/06 labs; off statin in 2006  . Hypertension     concentric LVH 2d echo 12/05; negative proteinuria 11/00  . 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 Diastolic dysfunction     impaired L ventricle relatation with an EF of 65% and echo in December 2005  . Erectile dysfunction   . Onychomycosis April 2003    right big toe  . Chest pain     with a negative adenosine cardiolite in December 2006. Dr. Robet Leu   . Tinea versicolor     dermatology referral; secondary to no treatement response to selenium sulfide; currently resolive  . Prostate disorder     prostate irregularity with obstructive  voiding symptoms (PSA 0.67); -likely related to overstimulation of alpha receptors at bladder neck; seen by Dr. Wanda Plump in 9/05. no BPH at that visit   Current Outpatient Prescriptions  Medication Sig Dispense Refill  . amLODipine (NORVASC) 10 MG tablet Take 10 mg by mouth daily.        Marland Kitchen doxazosin (CARDURA) 1 MG tablet Take 1 tablet (1 mg total) by mouth daily.  30 tablet  11  . ibuprofen (ADVIL,MOTRIN) 200 MG tablet Take 200 mg by mouth as needed. Take 3-4 tablets by mouth as needed for pain       . lisinopril (PRINIVIL,ZESTRIL) 40 MG tablet TAKE ONE TABLET BY MOUTH ONE TIME DAILY  31 tablet  3  . loratadine (CLARITIN) 10 MG tablet Take 1 tablet (10 mg total) by mouth daily.  30 tablet  5  . pravastatin (PRAVACHOL) 40 MG tablet Take 1 tablet (40 mg total) by mouth daily.  30 tablet  11  . sildenafil (VIAGRA) 50 MG tablet Take 1 tablet (50 mg total) by mouth as needed for erectile dysfunction.  20 tablet  1   Review of Systems:  Constitutional: Denies fever, chills, diaphoresis, appetite change and fatigue.  HEENT: Denies photophobia, eye pain,  Respiratory: Denies SOB, DOE, cough, chest tightness,  and wheezing.   Cardiovascular: Denies chest pain, palpitations and leg swelling.  Gastrointestinal: Denies nausea, vomiting, abdominal pain,  diarrhea, constipation, blood in stool and abdominal distention.  Genitourinary: Denies dysuria, urgency, frequency, hematuria, flank pain and difficulty urinating.  Musculoskeletal: Denies myalgias, back pain, joint swelling, arthralgias and gait problem.  Neurological: Denies dizziness, seizures, syncope, weakness, light-headedness, numbness and headaches.    Objective:  Physical Exam: Filed Vitals:   08/24/11 1615  BP: 135/95  Pulse: 88  Temp: 98.3 F (36.8 C)  TempSrc: Oral  Height: 5\' 6"  (1.676 m)  Weight: 178 lb 8 oz (80.967 kg)   Constitutional: Vital signs reviewed.  Patient is a well-developed and well-nourished  in no acute distress  and cooperative with exam. Alert and oriented x3.  Eyes: PERRL, EOMI, conjunctivae normal, No scleral icterus.  Neck: Supple, Trachea midline normal ROM,  Cardiovascular: RRR, S1 normal, S2 normal, no MRG, pulses symmetric and intact bilaterally Pulmonary/Chest: CTAB, no wheezes, rales, or rhonchi Abdominal: Soft. Non-tender, non-distended, bowel sounds are normal, no masses, organomegaly, or guarding present.  Musculoskeletal: No joint deformities, erythema, or stiffness, ROM full and no nontender. Neurological: A&O x3,  no focal motor deficit, sensory intact to light touch bilaterally.

## 2011-08-25 ENCOUNTER — Ambulatory Visit: Payer: Medicare Other | Admitting: *Deleted

## 2011-08-25 LAB — COMPREHENSIVE METABOLIC PANEL
ALT: 28 U/L (ref 0–53)
AST: 26 U/L (ref 0–37)
Alkaline Phosphatase: 82 U/L (ref 39–117)
BUN: 18 mg/dL (ref 6–23)
Calcium: 9.7 mg/dL (ref 8.4–10.5)
Glucose, Bld: 128 mg/dL — ABNORMAL HIGH (ref 70–99)
Potassium: 3.9 mEq/L (ref 3.5–5.3)
Sodium: 138 mEq/L (ref 135–145)
Total Protein: 7.7 g/dL (ref 6.0–8.3)

## 2011-08-27 NOTE — Assessment & Plan Note (Signed)
Currently on 4 medication on blood not controlled ( blood pressure was repeated ). Will start Atenolol and will evaluate in 2 weeks.

## 2011-08-27 NOTE — Assessment & Plan Note (Signed)
Patient has long history of erectile dysfunction and would like to have something for it. I referred that to discuss with PCP .

## 2011-08-31 ENCOUNTER — Ambulatory Visit: Payer: Medicare Other | Admitting: *Deleted

## 2011-09-01 LAB — DIFFERENTIAL
Eosinophils Absolute: 0.1
Monocytes Relative: 11
Neutro Abs: 5.2
Neutrophils Relative %: 70

## 2011-09-01 LAB — CBC
Hemoglobin: 13.2
MCHC: 32.1
Platelets: 260
RBC: 5.69
RDW: 15.7 — ABNORMAL HIGH
WBC: 7.4

## 2011-09-01 LAB — I-STAT 8, (EC8 V) (CONVERTED LAB)
Bicarbonate: 31.1 — ABNORMAL HIGH
HCT: 45
Hemoglobin: 15.3
Operator id: 151321
Sodium: 136

## 2011-09-01 LAB — POCT I-STAT CREATININE
Creatinine, Ser: 1.4
Operator id: 151321

## 2011-09-02 ENCOUNTER — Ambulatory Visit: Payer: Medicare Other | Admitting: Physical Therapy

## 2011-09-07 ENCOUNTER — Ambulatory Visit: Payer: Medicare Other | Admitting: Physical Therapy

## 2011-09-09 ENCOUNTER — Ambulatory Visit: Payer: Medicare Other | Admitting: Physical Therapy

## 2011-09-13 ENCOUNTER — Ambulatory Visit: Payer: Medicare Other | Admitting: Physical Therapy

## 2011-09-15 ENCOUNTER — Ambulatory Visit: Payer: Medicare Other | Attending: Internal Medicine | Admitting: Physical Therapy

## 2011-09-15 DIAGNOSIS — I69998 Other sequelae following unspecified cerebrovascular disease: Secondary | ICD-10-CM | POA: Insufficient documentation

## 2011-09-15 DIAGNOSIS — R269 Unspecified abnormalities of gait and mobility: Secondary | ICD-10-CM | POA: Insufficient documentation

## 2011-09-15 DIAGNOSIS — IMO0001 Reserved for inherently not codable concepts without codable children: Secondary | ICD-10-CM | POA: Insufficient documentation

## 2011-09-20 ENCOUNTER — Ambulatory Visit: Payer: Medicare Other | Admitting: Physical Therapy

## 2011-09-22 ENCOUNTER — Ambulatory Visit: Payer: Medicare Other | Admitting: Physical Therapy

## 2011-09-27 ENCOUNTER — Ambulatory Visit: Payer: Medicare Other | Admitting: Physical Therapy

## 2011-09-28 ENCOUNTER — Ambulatory Visit: Payer: Medicare Other | Admitting: Occupational Therapy

## 2011-09-29 ENCOUNTER — Ambulatory Visit: Payer: Medicare Other | Admitting: Physical Therapy

## 2011-09-29 ENCOUNTER — Ambulatory Visit: Payer: Medicare Other | Admitting: Occupational Therapy

## 2011-10-11 ENCOUNTER — Ambulatory Visit: Payer: Medicare Other | Admitting: Occupational Therapy

## 2011-10-12 ENCOUNTER — Other Ambulatory Visit: Payer: Self-pay | Admitting: Internal Medicine

## 2011-10-13 ENCOUNTER — Encounter: Payer: Medicare Other | Admitting: *Deleted

## 2011-10-18 ENCOUNTER — Ambulatory Visit: Payer: Medicare Other | Attending: Internal Medicine | Admitting: *Deleted

## 2011-10-18 DIAGNOSIS — I69998 Other sequelae following unspecified cerebrovascular disease: Secondary | ICD-10-CM | POA: Insufficient documentation

## 2011-10-18 DIAGNOSIS — R269 Unspecified abnormalities of gait and mobility: Secondary | ICD-10-CM | POA: Insufficient documentation

## 2011-10-18 DIAGNOSIS — IMO0001 Reserved for inherently not codable concepts without codable children: Secondary | ICD-10-CM | POA: Insufficient documentation

## 2011-10-20 ENCOUNTER — Ambulatory Visit: Payer: Medicare Other | Admitting: *Deleted

## 2011-10-25 ENCOUNTER — Ambulatory Visit: Payer: Medicare Other | Admitting: *Deleted

## 2011-10-27 ENCOUNTER — Encounter: Payer: Medicare Other | Admitting: *Deleted

## 2011-11-01 ENCOUNTER — Ambulatory Visit: Payer: Medicare Other | Admitting: *Deleted

## 2011-11-03 ENCOUNTER — Ambulatory Visit: Payer: Medicare Other | Admitting: *Deleted

## 2011-11-16 ENCOUNTER — Encounter: Payer: Medicare Other | Admitting: Occupational Therapy

## 2011-11-17 ENCOUNTER — Ambulatory Visit: Payer: Medicare PPO | Attending: Internal Medicine | Admitting: Occupational Therapy

## 2011-11-17 DIAGNOSIS — I69998 Other sequelae following unspecified cerebrovascular disease: Secondary | ICD-10-CM | POA: Insufficient documentation

## 2011-11-17 DIAGNOSIS — R269 Unspecified abnormalities of gait and mobility: Secondary | ICD-10-CM | POA: Insufficient documentation

## 2011-11-17 DIAGNOSIS — IMO0001 Reserved for inherently not codable concepts without codable children: Secondary | ICD-10-CM | POA: Insufficient documentation

## 2011-11-22 ENCOUNTER — Ambulatory Visit: Payer: Medicare PPO | Admitting: Occupational Therapy

## 2011-11-24 ENCOUNTER — Ambulatory Visit: Payer: Medicare PPO | Admitting: Occupational Therapy

## 2011-11-24 ENCOUNTER — Encounter: Payer: Medicare Other | Admitting: *Deleted

## 2011-11-24 ENCOUNTER — Other Ambulatory Visit: Payer: Self-pay | Admitting: Internal Medicine

## 2011-11-29 ENCOUNTER — Encounter: Payer: Medicare Other | Admitting: *Deleted

## 2011-12-01 ENCOUNTER — Encounter: Payer: Medicare Other | Admitting: *Deleted

## 2011-12-06 ENCOUNTER — Encounter: Payer: Medicare Other | Admitting: *Deleted

## 2011-12-08 ENCOUNTER — Encounter: Payer: Medicare Other | Admitting: *Deleted

## 2012-02-18 ENCOUNTER — Other Ambulatory Visit: Payer: Self-pay | Admitting: Internal Medicine

## 2012-03-13 ENCOUNTER — Other Ambulatory Visit: Payer: Self-pay | Admitting: Internal Medicine

## 2012-03-27 ENCOUNTER — Encounter: Payer: Self-pay | Admitting: Internal Medicine

## 2012-03-27 ENCOUNTER — Ambulatory Visit (HOSPITAL_COMMUNITY)
Admission: RE | Admit: 2012-03-27 | Discharge: 2012-03-27 | Disposition: A | Discharge status: Home / Self Care | Payer: Medicare PPO | Source: Ambulatory Visit | Attending: Internal Medicine | Admitting: Internal Medicine

## 2012-03-27 ENCOUNTER — Ambulatory Visit (INDEPENDENT_AMBULATORY_CARE_PROVIDER_SITE_OTHER): Payer: Medicare Other | Admitting: Internal Medicine

## 2012-03-27 VITALS — BP 147/96 | HR 93 | Temp 97.5°F | Wt 185.0 lb

## 2012-03-27 DIAGNOSIS — M1990 Unspecified osteoarthritis, unspecified site: Secondary | ICD-10-CM

## 2012-03-27 DIAGNOSIS — M1710 Unilateral primary osteoarthritis, unspecified knee: Secondary | ICD-10-CM | POA: Insufficient documentation

## 2012-03-27 DIAGNOSIS — I639 Cerebral infarction, unspecified: Secondary | ICD-10-CM

## 2012-03-27 DIAGNOSIS — E785 Hyperlipidemia, unspecified: Secondary | ICD-10-CM

## 2012-03-27 DIAGNOSIS — M25569 Pain in unspecified knee: Secondary | ICD-10-CM | POA: Insufficient documentation

## 2012-03-27 DIAGNOSIS — I6350 Cerebral infarction due to unspecified occlusion or stenosis of unspecified cerebral artery: Secondary | ICD-10-CM

## 2012-03-27 DIAGNOSIS — M25519 Pain in unspecified shoulder: Secondary | ICD-10-CM | POA: Insufficient documentation

## 2012-03-27 DIAGNOSIS — I10 Essential (primary) hypertension: Secondary | ICD-10-CM

## 2012-03-27 LAB — LIPID PANEL
Cholesterol: 246 mg/dL — ABNORMAL HIGH (ref 0–200)
HDL: 51 mg/dL (ref >39)
LDL Cholesterol: 160 mg/dL — ABNORMAL HIGH (ref 0–99)
Triglycerides: 175 mg/dL — ABNORMAL HIGH (ref <150)

## 2012-03-27 LAB — COMPREHENSIVE METABOLIC PANEL
BUN: 16 mg/dL (ref 6–23)
CO2: 27 mEq/L (ref 19–32)
Creat: 1.31 mg/dL (ref 0.50–1.35)
Glucose, Bld: 113 mg/dL — ABNORMAL HIGH (ref 70–99)
Total Bilirubin: 0.7 mg/dL (ref 0.3–1.2)

## 2012-03-27 MED ORDER — LISINOPRIL 40 MG PO TABS: 40.0000 mg | ORAL_TABLET | Freq: Every day | ORAL | Status: DC

## 2012-03-27 MED ORDER — BACLOFEN 10 MG PO TABS: 10.0000 mg | ORAL_TABLET | Freq: Two times a day (BID) | ORAL | Status: DC

## 2012-03-27 MED ORDER — ATENOLOL 50 MG PO TABS: 50.0000 mg | ORAL_TABLET | Freq: Every day | ORAL | Status: DC

## 2012-03-27 NOTE — Assessment & Plan Note (Addendum)
Lab Results  Component Value Date   NA 138 08/24/2011   K 3.9 08/24/2011   CL 99 08/24/2011   CO2 28 08/24/2011   BUN 18 08/24/2011   CREATININE 1.32 08/24/2011   CREATININE 1.29 12/16/2008    BP Readings from Last 3 Encounters:  03/27/12 147/96  08/24/11 135/95  06/06/11 136/84    Assessment: Hypertension control:  mildly elevated  Progress toward goals:  unchanged Barriers to meeting goals:  nonadherence to medications  Plan: Hypertension treatment:  continue current medications Patient not on atenolol, thus will refill. No other changes to regimen will be made. Looking at his regimen, he is on Amlodipine, atenolol, HCTZ, Lisinopril and doxazosin. I am unsure why he is on so many anti-hypertensives, and if he has been compliant. If he truly is compliant, then he needs a secondary cause work-up. ACE-I, maybe diuretic, Amlodipine may be sufficient alone.

## 2012-03-27 NOTE — Patient Instructions (Signed)
Please take Baclofen twice a day for spasms. Please follow up with xrays of shoulder and knee. Please take ibuprofen 600 mg every 8 hours as needed for pain. Please take all your blood pressure medications as directed. Please follow up in 2-4 weeks.

## 2012-03-27 NOTE — Assessment & Plan Note (Signed)
Will obtain shoulder xray and left knee xray. Shoulder pathology unclear because of increased tone in left arm. Rotator cuff vs increased spasticity residual from cva vs adhesive capsulitis. Will also refer to sports medicine for possible injection and further evaluation.

## 2012-03-27 NOTE — Assessment & Plan Note (Signed)
PT completed. Patient with increased spasticity which restricts movement. Will try Baclofen for patient.

## 2012-03-27 NOTE — Assessment & Plan Note (Signed)
Lab Results  Component Value Date   CHOL 270* 06/07/2011   HDL 57 06/07/2011   LDLCALC 196* 06/07/2011   TRIG 84 06/07/2011   CHOLHDL 4.7 06/07/2011   LDL not at goal even though patient is on statin. Question compliance, however patient states he is compliant. Will check lipid profile to see if it has improved and check CMET.

## 2012-03-27 NOTE — Progress Notes (Signed)
Subjective:     Patient ID: [name], male   DOB: 1961/02/12, 51 y.o.   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 However his left arm has increased tone and spasticity which makes it hard for him to use his left arm. Additionally he is not able to lift up his left arm over his head and thinks that he may have some arthritis in his shoulder. His left knee is also stiff and painful and he is worried that he might have arthritis as well in his left knee.  2.) HTN - he is on several antihypertensives of which she is only taking 3 of them as he ran out of refills. He denies headache, visual changes nausea or vomiting.  Patient has no other complaints or concerns today.  Review of Systems  All other systems reviewed and are negative.       Objective:   Physical Exam  Constitutional: He appears well-developed.  HENT:  Head: Normocephalic.  Eyes: Pupils are equal, round, and reactive to light.  Cardiovascular: Normal rate and regular rhythm.   Pulmonary/Chest: Effort normal and breath sounds normal.  Abdominal: Soft. Bowel sounds are normal.  Musculoskeletal:       Strength is 5/5 upper and lower extremities, increased tone left arm. Cannot lift above head (abduct), but without pronator drift. No crepitus appreciated Walks with limp due to residual left sided weakness from stroke

## 2012-04-04 ENCOUNTER — Encounter: Payer: Self-pay | Admitting: Family Medicine

## 2012-04-04 ENCOUNTER — Ambulatory Visit (INDEPENDENT_AMBULATORY_CARE_PROVIDER_SITE_OTHER): Payer: Medicare Other | Admitting: Family Medicine

## 2012-04-04 VITALS — BP 130/86 | HR 73 | Ht 66.0 in | Wt 185.0 lb

## 2012-04-04 DIAGNOSIS — M1990 Unspecified osteoarthritis, unspecified site: Secondary | ICD-10-CM

## 2012-04-04 DIAGNOSIS — M25519 Pain in unspecified shoulder: Secondary | ICD-10-CM

## 2012-04-04 MED ORDER — MELOXICAM 15 MG PO TABS: 15.0000 mg | ORAL_TABLET | Freq: Every day | ORAL | Status: DC

## 2012-04-04 NOTE — Patient Instructions (Signed)
You have been scheduled for an appt for an appointment for MRI on 04/10/12 at 8:45pm, please arrive at 8:15 pm.   [facility] [address] [place] [phone]

## 2012-04-10 ENCOUNTER — Ambulatory Visit
Admission: RE | Admit: 2012-04-10 | Discharge: 2012-04-10 | Disposition: A | Discharge status: Home / Self Care | Payer: Medicare Other | Source: Ambulatory Visit | Attending: Family Medicine | Admitting: Family Medicine

## 2012-04-10 DIAGNOSIS — M25519 Pain in unspecified shoulder: Secondary | ICD-10-CM

## 2012-04-10 NOTE — Progress Notes (Signed)
  Subjective:    Patient ID: [name], male    DOB: [birth year], 51 y.o.   MRN: [id number]  HPI  51 y/o male is here c/o left shoulder pain and left knee pain.  He is s/p a CVA in 1999.  His shoulder pain started several years ago when his sensation started to return. He has residual weakness and loss of sensation of the left upper extremity which has only improved a somewhat with physical therapy.  The shoulder pain has increased as he recovers motion and strength in the arm because he is trying to use it more often.  Part of his physical therapy was a shoulder specific HEP which he was admittedly non-complaint with.    He has had left knee pain for about the same amount of time.  It is worse with activity.  It often feels tight.  He doesn't think it swells.   Review of Systems     Objective:   Physical Exam  Left shoulder: Noticeable weakness that has lead to asymmetry of the shoulders He has good muscle bulk but the strength is 2-3/5 Active motion is extremely limited (less than 90 degrees) in all planes but passive motion is maintained Tenderness to palpation of the anterior shoulder in the region of the biceps tendon Impingement testing is unable to be reliably completed  Left knee No effusion Positive crepitus Good motion Residual weakness from CVA Stable ligaments No joint line tenderness      Assessment & Plan:

## 2012-04-10 NOTE — Assessment & Plan Note (Signed)
Discussed treatment options. Injection vs oral anti-inflammatory.  He would like to try oral meds first and consider an injection for continued symptoms.

## 2012-04-10 NOTE — Assessment & Plan Note (Signed)
After a discussion of possible causes of his pain and options for further work up an MRI is ordered to evaluate the rotator cuff.  Offered a diagnostic/therapeutic subacromial injection to evaluate the cuff but he doesn't want any injections at this time because it might be painful.  For the same reason we are not ordering a nerve conduction study.  Further plans will depend on the results of his MRI.

## 2012-04-11 ENCOUNTER — Ambulatory Visit: Payer: Medicare Other | Admitting: Family Medicine

## 2012-04-16 ENCOUNTER — Ambulatory Visit: Payer: Medicare Other | Admitting: Sports Medicine

## 2012-05-04 ENCOUNTER — Ambulatory Visit (INDEPENDENT_AMBULATORY_CARE_PROVIDER_SITE_OTHER): Payer: Medicare Other | Admitting: Family Medicine

## 2012-05-04 VITALS — BP 159/110

## 2012-05-04 DIAGNOSIS — M25519 Pain in unspecified shoulder: Secondary | ICD-10-CM

## 2012-05-04 DIAGNOSIS — M1990 Unspecified osteoarthritis, unspecified site: Secondary | ICD-10-CM

## 2012-05-04 MED ORDER — NITROGLYCERIN 0.2 MG/HR TD PT24: MEDICATED_PATCH | TRANSDERMAL | Status: DC

## 2012-05-04 MED ORDER — METHYLPREDNISOLONE ACETATE 40 MG/ML IJ SUSP: 80.0000 mg | Freq: Once | INTRAMUSCULAR | Status: DC

## 2012-05-04 NOTE — Patient Instructions (Addendum)
1. Physical therapy will call you with your appointment.  If you don't hear from them their phone number is [phone].  2. Start using your nitroglycerin patches, 1/4 a day on your shoulder.  You cannot use any medications for erectile dysfunction while taking the nitroglycerin patch.  3. You had an injection in your knee today.  Sometimes people have cramping the night after they get an injection.  If you have cramping take tylenol and ice your knee.    4.  Take it easy on your knee for the next couple of days.  5. Follow up in one month.

## 2012-05-04 NOTE — Assessment & Plan Note (Signed)
Injection today.   Follow up in one month.

## 2012-05-04 NOTE — Assessment & Plan Note (Signed)
MRI shows a type 3 acromion with bursitis and a small tear of the supraspinatus.  We will try to address this with scapular stabilization to correct his internally rotated posture at the shoulders hence increasing the subacromial space.

## 2012-05-04 NOTE — Progress Notes (Signed)
  Subjective:    Patient ID: [name], male    DOB: 1960-12-02, 51 y.o.   MRN: [id number]  HPI  51 y/o male is here for follow up for his MRI results.  He continues to have left shoulder pain with the same characteristics.  Regarding his left knee pain, he has good days and bad days.  He has reconsidered having an injection.   Review of Systems     Objective:   Physical Exam  MRI left shoulder: Tiny intrasubstance tear of the supraspinatus, type 3 acromion, and subacromial bursitis.  Left knee (non-weight bearing) Tricompartmental degenerative changes  Consent obtained and verified. Cleansed with alcohol. Topical analgesic spray: Ethyl chloride. Joint: left knee Approached in typical fashion from the anteromedial aspect Completed without difficulty Meds: 80 mg depo medrol; 6 ml of lidocaine without epinephrine Needle: 25 g Aftercare instructions and Red flags advised.      Assessment & Plan:

## 2012-05-14 ENCOUNTER — Ambulatory Visit: Payer: Medicare PPO | Attending: Family Medicine | Admitting: Physical Therapy

## 2012-05-14 DIAGNOSIS — M25519 Pain in unspecified shoulder: Secondary | ICD-10-CM | POA: Insufficient documentation

## 2012-05-14 DIAGNOSIS — M25619 Stiffness of unspecified shoulder, not elsewhere classified: Secondary | ICD-10-CM | POA: Insufficient documentation

## 2012-05-14 DIAGNOSIS — IMO0001 Reserved for inherently not codable concepts without codable children: Secondary | ICD-10-CM | POA: Insufficient documentation

## 2012-05-14 DIAGNOSIS — R5381 Other malaise: Secondary | ICD-10-CM | POA: Insufficient documentation

## 2012-05-22 ENCOUNTER — Ambulatory Visit: Payer: Medicare PPO | Admitting: Physical Therapy

## 2012-05-25 ENCOUNTER — Ambulatory Visit: Payer: Medicare PPO | Admitting: Physical Therapy

## 2012-05-29 ENCOUNTER — Ambulatory Visit: Payer: Medicare PPO

## 2012-06-01 ENCOUNTER — Ambulatory Visit: Payer: Medicare PPO | Admitting: Physical Therapy

## 2012-06-04 ENCOUNTER — Ambulatory Visit: Payer: Medicare Other | Admitting: Sports Medicine

## 2012-06-05 ENCOUNTER — Ambulatory Visit: Payer: Medicare PPO | Admitting: Physical Therapy

## 2012-06-06 ENCOUNTER — Ambulatory Visit (INDEPENDENT_AMBULATORY_CARE_PROVIDER_SITE_OTHER): Payer: Medicare Other | Admitting: Sports Medicine

## 2012-06-06 VITALS — BP 148/97

## 2012-06-06 DIAGNOSIS — M7500 Adhesive capsulitis of unspecified shoulder: Secondary | ICD-10-CM

## 2012-06-06 DIAGNOSIS — M25519 Pain in unspecified shoulder: Secondary | ICD-10-CM

## 2012-06-06 NOTE — Progress Notes (Signed)
  Subjective:    Patient ID: [name], male    DOB: May 16, 1961, 51 y.o.   MRN: [id number]  HPI chief complaint: Followup on left shoulder pain  Patient comes in today for followup on his left shoulder. He has been treated by Dr. Laural Benes. In brief, this patient has a history of a CVA in 1999. This left him with left-sided weakness. He began to experience left shoulder pain about a year ago without any trauma. He has most recently undergone an MRI of his left shoulder shows a minimal tear at the insertion of the supraspinatus as well as a type III acromium predisposing him to shoulder impingement. He is attending physical therapy and has noted some improvement. Main complaint is limited range of motion along with diffuse shoulder pain.    Review of Systems     Objective:   Physical Exam Well-developed, well-nourished. No acute distress. Patient has limited shoulder motion actively and I'll planes. This includes Limited passive external rotation when compared to the uninvolved right shoulder. Global left upper extremity weakness. No tenderness to palpation. Good radial and ulnar pulses distally.       Assessment & Plan:  1. Left shoulder pain secondary to adhesive capsulitis 2. Status post CVA in 1999 with residual left-sided weakness  Patient's left glenohumeral space was injected with 3 cc of Marcaine and 1 cc of depomedrol. This was done atraumatically under sterile technique after risks and benefits were explained. Patient tolerated the procedure without any difficulty. He'll continue with physical therapy and followup in 4 weeks. Call with questions or concerns in the interim.

## 2012-06-06 NOTE — Patient Instructions (Addendum)
Continue with physical therapy and followup with me in 4 weeks

## 2012-06-07 ENCOUNTER — Ambulatory Visit: Payer: Medicare PPO | Admitting: Physical Therapy

## 2012-06-12 ENCOUNTER — Encounter: Payer: Medicare Other | Admitting: Physical Therapy

## 2012-06-14 ENCOUNTER — Ambulatory Visit: Payer: Medicare PPO | Attending: Family Medicine | Admitting: Physical Therapy

## 2012-06-14 DIAGNOSIS — R5381 Other malaise: Secondary | ICD-10-CM | POA: Insufficient documentation

## 2012-06-14 DIAGNOSIS — IMO0001 Reserved for inherently not codable concepts without codable children: Secondary | ICD-10-CM | POA: Insufficient documentation

## 2012-06-14 DIAGNOSIS — M25619 Stiffness of unspecified shoulder, not elsewhere classified: Secondary | ICD-10-CM | POA: Insufficient documentation

## 2012-06-14 DIAGNOSIS — M25519 Pain in unspecified shoulder: Secondary | ICD-10-CM | POA: Insufficient documentation

## 2012-07-05 ENCOUNTER — Ambulatory Visit: Payer: Medicare Other | Admitting: Sports Medicine

## 2012-07-11 ENCOUNTER — Other Ambulatory Visit: Payer: Self-pay | Admitting: Internal Medicine

## 2012-08-27 ENCOUNTER — Other Ambulatory Visit: Payer: Self-pay | Admitting: Internal Medicine

## 2012-11-20 ENCOUNTER — Encounter: Payer: Self-pay | Admitting: Internal Medicine

## 2012-11-20 ENCOUNTER — Ambulatory Visit (INDEPENDENT_AMBULATORY_CARE_PROVIDER_SITE_OTHER): Payer: Medicare PPO | Admitting: Internal Medicine

## 2012-11-20 VITALS — BP 123/88 | HR 88 | Temp 97.4°F | Ht 66.0 in | Wt 187.8 lb

## 2012-11-20 DIAGNOSIS — F528 Other sexual dysfunction not due to a substance or known physiological condition: Secondary | ICD-10-CM

## 2012-11-20 DIAGNOSIS — E785 Hyperlipidemia, unspecified: Secondary | ICD-10-CM

## 2012-11-20 DIAGNOSIS — I10 Essential (primary) hypertension: Secondary | ICD-10-CM

## 2012-11-20 DIAGNOSIS — Z0000 Encounter for general adult medical examination without abnormal findings: Secondary | ICD-10-CM

## 2012-11-20 DIAGNOSIS — M722 Plantar fascial fibromatosis: Secondary | ICD-10-CM | POA: Insufficient documentation

## 2012-11-20 DIAGNOSIS — N529 Male erectile dysfunction, unspecified: Secondary | ICD-10-CM

## 2012-11-20 LAB — LIPID PANEL
Cholesterol: 284 mg/dL — ABNORMAL HIGH (ref 0–200)
LDL Cholesterol: 208 mg/dL — ABNORMAL HIGH (ref 0–99)
VLDL: 23 mg/dL (ref 0–40)

## 2012-11-20 LAB — TESTOSTERONE: Testosterone: 300.26 ng/dL (ref 300–890)

## 2012-11-20 MED ORDER — IBUPROFEN 200 MG PO TABS: 200.0000 mg | ORAL_TABLET | Freq: Three times a day (TID) | ORAL | Status: DC | PRN

## 2012-11-20 NOTE — Assessment & Plan Note (Signed)
His LDL was 160 at 03/27/12. He used to take pravastatin, but stopped taking it for more than 5 months. He has 3 risk factors including hyperlipidemia, hypertension and age. His goal of LDL is less than 100, ideally less than 70. We'll recheck his lipid profile today.

## 2012-11-20 NOTE — Assessment & Plan Note (Signed)
Patient's ED is most likely caused by vascular dysfunction giving his history of stroke, hyperlipidemia and hypertension. Patient had a low level of testosterone of 244 in 2009. We'll the check his testosterone level. It is still over patient may need to test for [facility] in order to know whether it is a primary or secondary hypogonadism.

## 2012-11-20 NOTE — Patient Instructions (Signed)
1. I will let you know the result of Testosterone test when it comes back.  2. please taking ibuprofen more consistently every day for your foot pain 3. If you have worsening of your symptoms or new symptoms arise, please call the clinic [phone]), or go to the ER immediately if symptoms are severe.  Plantar Fasciitis Plantar fasciitis is a common condition that causes foot pain. It is soreness (inflammation) of the band of tough fibrous tissue on the bottom of the foot that runs from the heel bone (calcaneus) to the ball of the foot. The cause of this soreness may be from excessive standing, poor fitting shoes, running on hard surfaces, being overweight, having an abnormal walk, or overuse (this is common in runners) of the painful foot or feet. It is also common in aerobic exercise dancers and ballet dancers. SYMPTOMS  Most people with plantar fasciitis complain of:  Severe pain in the morning on the bottom of their foot especially when taking the first steps out of bed. This pain recedes after a few minutes of walking.  Severe pain is experienced also during walking following a long period of inactivity.  Pain is worse when walking barefoot or up stairs DIAGNOSIS   Your caregiver will diagnose this condition by examining and feeling your foot.  Special tests such as X-rays of your foot, are usually not needed. PREVENTION   Consult a sports medicine professional before beginning a new exercise program.  Walking programs offer a good workout. With walking there is a lower chance of overuse injuries common to runners. There is less impact and less jarring of the joints.  Begin all new exercise programs slowly. If problems or pain develop, decrease the amount of time or distance until you are at a comfortable level.  Wear good shoes and replace them regularly.  Stretch your foot and the heel cords at the back of the ankle (Achilles tendon) both before and after exercise.  Run or  exercise on even surfaces that are not hard. For example, asphalt is better than pavement.  Do not run barefoot on hard surfaces.  If using a treadmill, vary the incline.  Do not continue to workout if you have foot or joint problems. Seek professional help if they do not improve. HOME CARE INSTRUCTIONS   Avoid activities that cause you pain until you recover.  Use ice or cold packs on the problem or painful areas after working out.  Only take over-the-counter or prescription medicines for pain, discomfort, or fever as directed by your caregiver.  Soft shoe inserts or athletic shoes with air or gel sole cushions may be helpful.  If problems continue or become more severe, consult a sports medicine caregiver or your own health care provider. Cortisone is a potent anti-inflammatory medication that may be injected into the painful area. You can discuss this treatment with your caregiver. MAKE SURE YOU:   Understand these instructions.  Will watch your condition.  Will get help right away if you are not doing well or get worse. Document Released: 07/26/2001 Document Revised: 01/23/2012 Document Reviewed: 09/24/2008 [facility] Patient Information 2013 [place], [place].

## 2012-11-20 NOTE — Assessment & Plan Note (Signed)
BP Readings from Last 3 Encounters:  11/20/12 123/88  06/06/12 148/97  05/04/12 159/110    Lab Results  Component Value Date   NA 141 03/27/2012   K 3.8 03/27/2012   CREATININE 1.31 03/27/2012    Assessment:  Blood pressure control: controlled  Progress toward BP goal:  at goal  Comments: Patient's blood pressure is well controlled. Today blood pressure is normal. Will continue current regimen. Plan:  Medications:  continue current medications ( lisinopril 40 mg daily, amlodipine 10 mg daily, hydrochlorothiazide 25 mg daily and atenolol 50 mg daily).  Educational resources provided: brochure  Self management tools provided: home blood pressure logbook  Other plans: Continue current regimen

## 2012-11-20 NOTE — Progress Notes (Addendum)
Patient ID: [name], male   DOB: 1961-06-15, 52 y.o.   MRN: [id number]  Subjective:   Patient ID: [name] male   DOB: [birth year] 52 y.o.   MRN: [id number]  HPI: [name] is a 52 y.o. with a past medical history assault below, who presents for a followup visit.  Patient has a history of stroke in 1999 with a left side weakness. It is stable now, now new issues.  Regarding HTN, patient is taken hydrochlorothiazide 25 mg daily, lisinopril 40 mg daily. His blood pressure has been well controlled. Today blood pressure is 123/88.  Regarding HLD, patient LDL was 160 at 03/27/12. He used to take pravastatin in the past, but stopped taking this medication for more than 5 months. He has a concern about the side effects of this medication.   Patient also reported to have ED for years. Viagra was effective, but too expensive for him. He was tested for testosterone level at year 2009, which was slightly lower than normal at 244 (normal range 350 to 890). He has low libido and could not reach erection for sexual intercourse.   Patient also reports having bilateral foot pain at the plantar areas. He said his foot pain is worse in the early morning, particularly in the first few steps after getting up from the bed. It has been going on for almost a month.   Denies fever, chills, fatigue, headaches,  cough, chest pain, SOB,  abdominal pain,diarrhea, constipation, dysuria, hematuria.    Past Medical History  Diagnosis Date  . Hyperlipidemia     elevated transaminases on statin?-40's (2/06) - Hep B/C negative 4/06; resolution on 4/06 labs; off statin in 2006  . Hypertension     concentric LVH 2d echo 12/05; negative proteinuria 11/00  . 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 Diastolic dysfunction     impaired L ventricle relatation with an EF of 65% and echo in December 2005  . Erectile dysfunction   . Onychomycosis April 2003    right big toe  . Chest pain     with a negative adenosine cardiolite in December 2006. Dr. Robet Leu   . Tinea versicolor     dermatology referral; secondary to no treatement response to selenium sulfide; currently resolive  . 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 Wanda Plump in 9/05. no BPH at that visit   Current Outpatient Prescriptions  Medication Sig Dispense Refill  . amLODipine (NORVASC) 10 MG tablet TAKE ONE TABLET BY MOUTH ONE TIME DAILY  30 tablet  2  . atenolol (TENORMIN) 50 MG tablet TAKE ONE TABLET BY MOUTH ONE TIME DAILY  30 tablet  2  . hydrochlorothiazide (HYDRODIURIL) 25 MG tablet TAKE ONE TABLET BY MOUTH ONE TIME DAILY  30 tablet  2  . ibuprofen (ADVIL,MOTRIN) 200 MG tablet Take 1 tablet (200 mg total) by mouth every 8 (eight) hours as needed.  90 tablet  3  . lisinopril (PRINIVIL,ZESTRIL) 40 MG tablet Take 1 tablet (  40 mg total) by mouth daily.  30 tablet  6  . nitroGLYCERIN (NITRODUR - DOSED IN MG/24 HR) 0.2 mg/hr Use 1/4 patch on your shoulder.  Replace daily.  30 patch  11  . doxazosin (CARDURA) 1 MG tablet TAKE ONE TABLET BY MOUTH ONE TIME DAILY  30 tablet  2  . pravastatin (PRAVACHOL) 40 MG tablet TAKE ONE TABLET BY MOUTH ONE TIME DAILY  30 tablet  10  . sildenafil (VIAGRA) 50 MG tablet Take 1 tablet (50 mg total) by mouth as needed for erectile dysfunction.  20 tablet  1   Current Facility-Administered Medications  Medication Dose Route Frequency Provider Last Rate Last Dose  . methylPREDNISolone acetate  (DEPO-MEDROL) injection 80 mg  80 mg Intra-articular Once Sherrell Puller, MD       No family history on file. History   Social History  . Marital Status: Married    Spouse Name: N/A    Number of Children: N/A  . Years of Education: N/A   Social History Main Topics  . Smoking status: Never Smoker   . Smokeless tobacco: Never Used  . Alcohol Use: Yes     Comment: ocassional use  . Drug Use: No     Comment: former cocaine abuse  . Sexually Active: None     Comment: recently finished [employer] computer associate degree, looking for job, on disability for now   Other Topics Concern  . None   Social History Narrative  . None   Review of Systems: As per HPI  Objective:  Physical Exam: Filed Vitals:   11/20/12 0849  BP: 123/88  Pulse: 88  Temp: 97.4 F (36.3 C)  TempSrc: Oral  Height: 5\' 6"  (1.676 m)  Weight: 187 lb 12.8 oz (85.186 kg)  SpO2: 96%   Constitutional: He appears well-developed.  HENT:   Head: Normocephalic.  Eyes: Pupils are equal, round, and reactive to light.  Cardiovascular: Normal rate and regular rhythm.   Pulmonary/Chest: Effort normal and breath sounds normal.  Abdominal: Soft. Bowel sounds are normal.  Musculoskeletal:  Strength is 5/5 in upper and lower extremities in right side and left lower extremity. Increased tone in left arm. Cannot lift left arm above head (abduct). Walks with limp due to residual left sided weakness from stroke.  Foot: no swelling or redness in both feet there is mild tenderness over the plantar areas. Dorsal flexion of toes induced pain in plantar areas bilaterally.    Assessment & Plan:    Addendum:  1. Patient's testosterone level is normal. I called patient about these results. She elects to try Cialis for his ED. prescription was sent to his pharmacy. 2. patient's LDL level is 208. His LDL goal is less than 100, ideally less than 70. Will start pravastatin from now.

## 2012-11-20 NOTE — Assessment & Plan Note (Signed)
Patient's foot pain is most likely caused by plantar fasciitis. His symptoms is a typical for this diagnosis. Physical examination also shows typical findings. will treat with ibuprofen from now. Patient was also given reading materials for exercise.

## 2012-11-20 NOTE — Assessment & Plan Note (Signed)
Patient refused flu shot and colonoscopy today, will postpone.

## 2012-11-21 ENCOUNTER — Other Ambulatory Visit: Payer: Self-pay | Admitting: Internal Medicine

## 2012-11-21 DIAGNOSIS — E785 Hyperlipidemia, unspecified: Secondary | ICD-10-CM

## 2012-11-21 DIAGNOSIS — N529 Male erectile dysfunction, unspecified: Secondary | ICD-10-CM

## 2012-11-21 MED ORDER — PRAVASTATIN SODIUM 40 MG PO TABS: 40.0000 mg | ORAL_TABLET | Freq: Every day | ORAL | Status: DC

## 2012-11-21 MED ORDER — TADALAFIL 10 MG PO TABS: 5.0000 mg | ORAL_TABLET | ORAL | Status: DC | PRN

## 2013-01-03 ENCOUNTER — Other Ambulatory Visit: Payer: Self-pay | Admitting: Internal Medicine

## 2013-01-03 ENCOUNTER — Encounter: Payer: Self-pay | Admitting: Internal Medicine

## 2013-01-03 ENCOUNTER — Ambulatory Visit (INDEPENDENT_AMBULATORY_CARE_PROVIDER_SITE_OTHER): Payer: Medicare Other | Admitting: Internal Medicine

## 2013-01-03 ENCOUNTER — Telehealth: Payer: Self-pay | Admitting: *Deleted

## 2013-01-03 VITALS — BP 178/119 | HR 93 | Temp 97.8°F | Ht 67.0 in | Wt 188.3 lb

## 2013-01-03 DIAGNOSIS — I10 Essential (primary) hypertension: Secondary | ICD-10-CM

## 2013-01-03 DIAGNOSIS — Z1159 Encounter for screening for other viral diseases: Secondary | ICD-10-CM

## 2013-01-03 DIAGNOSIS — Z113 Encounter for screening for infections with a predominantly sexual mode of transmission: Secondary | ICD-10-CM

## 2013-01-03 NOTE — Telephone Encounter (Signed)
Pt called stating he went to give blood [facility] the [facility] and he was told that "Flagged" for some viral problem.   He was not aware of this and wanted to know what type of virus he has. Pt # [id number]

## 2013-01-03 NOTE — Patient Instructions (Addendum)
General Instructions: Please schedule a follow up appointment  As needed . Please bring your medication bottles with your next appointment. Please take your medicines as prescribed. I will call you with your lab results if anything will be abnormal. Please take your BP medications today, else it increases the risk of recurrent stroke .     Treatment Goals:  Goals (1 Years of Data) as of 01/03/13         As of Today As of Today 11/20/12 06/06/12 05/04/12     Blood Pressure    . Blood Pressure < 140/90  178/119 165/100 123/88 148/97 159/110      Progress Toward Treatment Goals:  Treatment Goal 01/03/2013  Blood pressure deteriorated    Self Care Goals & Plans:  Self Care Goal 01/03/2013  Manage my medications refill my medications on time  Monitor my health keep track of my blood glucose  Eat healthy foods eat foods that are low in salt       Care Management & Community Referrals:

## 2013-01-03 NOTE — Telephone Encounter (Signed)
Pt has appointment today at 1:15.

## 2013-01-03 NOTE — Telephone Encounter (Signed)
I called [name] and talked to him over the phone. Apparently, he went to a blood bank where they "searched him up in some database" using his SSN and told him that he has a viral infection. He is bewildered by this information. I talked to him for over 15 minutes. I said I am confused by the manner the blood bank looked him up in a database and did not do any blood tests before telling him that he has a viral infection and thus is ineligible to donate blood. I told him that the clinic has never tested him for any viral infection other than HIV which was negative. I asked him if he could get more information from the blood bank, which he denied saying that he was brushed off the very first time. He wishes to come in and get tested today and I have referred him to triage so he can be given an appointment. I personally cannot understand his encounter with the blood bank.

## 2013-01-04 LAB — HEPATITIS PANEL, ACUTE
Hep B C IgM: NEGATIVE
Hepatitis B Surface Ag: NEGATIVE

## 2013-01-04 LAB — RPR

## 2013-01-04 LAB — HIV ANTIBODY (ROUTINE TESTING W REFLEX): HIV: NONREACTIVE

## 2013-01-04 NOTE — Assessment & Plan Note (Signed)
Patient was refused as a donor in the blood donation camp because of some viral disease. He denies any risk factors including multiple sexual partners, IV drug use or history of previous blood transfusions. -Check HIV and chronic hepatitis panel. -Would also screen him for syphilis( although suspicion is very low).

## 2013-01-04 NOTE — Assessment & Plan Note (Signed)
His blood pressure was moderately elevated in the clinic in the setting of not taking his blood pressure medicine for few days and some mental stress. When he initially walked into the clinic his blood pressure was 165/ 100 , with re-check it  was further elevated to 178/119( States that he is pretty stressed about his viral disease). He was emphasized the importance of medication adherence. He can get hydrochlorothiazide for $4 from the [facility]. He was stressed that he should at least start taking HCTZ .  Given the history of CVAs in the past, he was educated that uncontrolled blood pressure could result in another stroke. He verbalizes understanding. -Return to clinic in 1 week for blood pressure recheck.

## 2013-01-04 NOTE — Progress Notes (Signed)
Subjective:   Patient ID: [name] male   DOB: 1961-07-09 52 y.o.   MRN: [id number]  HPI: 52 year old gentleman with past medical history significant for hypertension, CVA x2  in 1999 and 2005 with residual left sided weakness presents to the clinic for a blood test.  Patient reports that he has been running out of his blood pressure as he has no money for co pay. He thought to earn some money for buying his medications through blood donation. He was refused as a donor at the blood camp because he was found to be having some viral infection. Patient got very concerned and came to the clinic for an evaluation. He denies any history of multiple sexual partners, blood transfusions in the past  or IV drug use. States that he he has been married to his wife for last 25 years and is loyal in his relationship.   Past Medical History  Diagnosis Date  . Hyperlipidemia     elevated transaminases on statin?-40's (2/06) - Hep B/C negative 4/06; resolution on 4/06 labs; off statin in 2006  . Hypertension     concentric LVH 2d echo 12/05; negative proteinuria 11/00  . 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 Diastolic dysfunction     impaired L ventricle relatation with an EF of 65% and echo in December 2005  . Erectile dysfunction   . Onychomycosis April 2003    right big toe  . Chest pain     with a negative adenosine cardiolite in December 2006. Dr. Robet Leu   . Tinea versicolor     dermatology referral; secondary to no treatement response to selenium sulfide; currently resolive  . Prostate disorder     prostate  irregularity with obstructive voiding symptoms (PSA 0.67); -likely related to overstimulation of alpha receptors at bladder neck; seen by Dr. Wanda Plump in 9/05. no BPH at that visit   No family history on file. History   Social History  . Marital Status: Married    Spouse Name: N/A    Number of Children: N/A  . Years of Education: N/A   Occupational History  . Not on file.   Social History Main Topics  . Smoking status: Never Smoker   . Smokeless tobacco: Never Used  . Alcohol Use: Yes     Comment: ocassional use  . Drug Use: No     Comment: former cocaine abuse  . 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 Not on file   Social History Narrative  . No narrative on file   Review of Systems: General: Denies fever, chills, diaphoresis, appetite change and fatigue. HEENT: Denies photophobia, eye pain, redness, hearing loss, ear pain, congestion, sore throat, rhinorrhea, sneezing, mouth sores, trouble swallowing, neck pain, neck stiffness and tinnitus. Respiratory: Denies SOB, DOE, cough, chest tightness, and wheezing. Cardiovascular: Denies to chest pain, palpitations and leg swelling. Gastrointestinal: Denies nausea, vomiting, abdominal pain, diarrhea, constipation, blood in stool and abdominal distention. Genitourinary: Denies dysuria, urgency, frequency,  hematuria, flank pain and difficulty urinating. Musculoskeletal: Denies myalgias, back pain, joint swelling, arthralgias and gait problem.  Skin: Denies pallor, rash and wound. Neurological: Denies dizziness, seizures, syncope, weakness, light-headedness, numbness and headaches. Hematological: Denies adenopathy, easy bruising, personal or family bleeding history. Psychiatric/Behavioral: Denies suicidal ideation, mood changes, confusion, nervousness, sleep disturbance and agitation.    Current Outpatient  Medications: Current Outpatient Prescriptions  Medication Sig Dispense Refill  . amLODipine (NORVASC) 10 MG tablet TAKE ONE TABLET BY MOUTH ONE TIME DAILY  30 tablet  2  . atenolol (TENORMIN) 50 MG tablet TAKE ONE TABLET BY MOUTH ONE TIME DAILY  30 tablet  2  . doxazosin (CARDURA) 1 MG tablet TAKE ONE TABLET BY MOUTH ONE TIME DAILY  30 tablet  2  . hydrochlorothiazide (HYDRODIURIL) 25 MG tablet TAKE ONE TABLET BY MOUTH ONE TIME DAILY  30 tablet  2  . ibuprofen (ADVIL,MOTRIN) 200 MG tablet Take 1 tablet (200 mg total) by mouth every 8 (eight) hours as needed.  90 tablet  3  . lisinopril (PRINIVIL,ZESTRIL) 40 MG tablet Take 1 tablet (40 mg total) by mouth daily.  30 tablet  6  . nitroGLYCERIN (NITRODUR - DOSED IN MG/24 HR) 0.2 mg/hr Use 1/4 patch on your shoulder.  Replace daily.  30 patch  11  . pravastatin (PRAVACHOL) 40 MG tablet Take 1 tablet (40 mg total) by mouth daily.  30 tablet  10  . sildenafil (VIAGRA) 50 MG tablet Take 1 tablet (50 mg total) by mouth as needed for erectile dysfunction.  20 tablet  1  . tadalafil (CIALIS) 10 MG tablet Take 0.5 tablets (5 mg total) by mouth as needed for erectile dysfunction.  20 tablet  1   Current Facility-Administered Medications  Medication Dose Route Frequency Provider Last Rate Last Dose  . methylPREDNISolone acetate (DEPO-MEDROL) injection 80 mg  80 mg Intra-articular Once Sherrell Puller, MD        Allergies: No Known Allergies    Objective:   Physical Exam: Filed Vitals:   01/03/13 1358  BP: 178/119  Pulse:   Temp:     General: Vital signs reviewed and noted. Well-developed, well-nourished, in no acute distress; alert, appropriate and cooperative throughout examination. Head: Normocephalic, atraumatic Lungs: Normal respiratory effort. Clear to auscultation BL without crackles or wheezes. Heart: RRR. S1 and S2 normal without gallop, murmur, or rubs. Abdomen:BS normoactive. Soft, Nondistended, non-tender.  No masses or  organomegaly. Extremities: No pretibial edema.     Assessment & Plan:

## 2013-01-07 ENCOUNTER — Ambulatory Visit (INDEPENDENT_AMBULATORY_CARE_PROVIDER_SITE_OTHER): Payer: Medicare Other | Admitting: Internal Medicine

## 2013-01-07 ENCOUNTER — Encounter: Payer: Self-pay | Admitting: Licensed Clinical Social Worker

## 2013-01-07 ENCOUNTER — Encounter: Payer: Self-pay | Admitting: Internal Medicine

## 2013-01-07 VITALS — BP 151/102 | HR 85 | Temp 96.8°F | Ht 67.0 in | Wt 188.7 lb

## 2013-01-07 DIAGNOSIS — R718 Other abnormality of red blood cells: Secondary | ICD-10-CM

## 2013-01-07 LAB — COMPLETE METABOLIC PANEL WITH GFR
ALT: 38 U/L (ref 0–53)
Albumin: 3.9 g/dL (ref 3.5–5.2)
CO2: 27 mEq/L (ref 19–32)
Chloride: 104 mEq/L (ref 96–112)
GFR, Est African American: 73 mL/min
GFR, Est Non African American: 63 mL/min
Glucose, Bld: 114 mg/dL — ABNORMAL HIGH (ref 70–99)
Potassium: 3.7 mEq/L (ref 3.5–5.3)
Sodium: 139 mEq/L (ref 135–145)
Total Protein: 6.8 g/dL (ref 6.0–8.3)

## 2013-01-07 LAB — CBC
MCH: 23.6 pg — ABNORMAL LOW (ref 26.0–34.0)
MCHC: 34.2 g/dL (ref 30.0–36.0)
RDW: 16.9 % — ABNORMAL HIGH (ref 11.5–15.5)

## 2013-01-07 MED ORDER — DOXAZOSIN MESYLATE 1 MG PO TABS: 1.0000 mg | ORAL_TABLET | Freq: Every day | ORAL | Status: DC

## 2013-01-07 MED ORDER — HYDROCHLOROTHIAZIDE 25 MG PO TABS: 25.0000 mg | ORAL_TABLET | Freq: Every day | ORAL | Status: DC

## 2013-01-07 MED ORDER — ATENOLOL 50 MG PO TABS: 50.0000 mg | ORAL_TABLET | Freq: Every day | ORAL | Status: DC

## 2013-01-07 NOTE — Progress Notes (Signed)
Subjective:   Patient ID: [name] male   DOB: 1961/09/27 52 y.o.   MRN: [id number]  HPI: 52 year old gentleman with past medical history significant for hypertension, intracranial hemorrhage x2 comes to the clinic for a followup visit.  He was seen in the clinic last week per his request to be screened for viral disease because he was denied blood donation at [facility] [place]. He wanted to discuss the results from the blood draw.  The patient was also noted to have high blood pressure with 178/118 with his last clinic visit in the setting of not taking his blood pressure medications because of the cost ( he went for blood donation to get some money). He returns today and his blood pressure is 150/102 and he still not taking his blood pressure medications. Now he tells me that he did not have refills when he went to pick up his medicines from the pharmacy. He denies any headaches, blurred vision, weakness or numbness. He states that his disability pay check would be there by Wednesday.     Past Medical History  Diagnosis Date  . Hyperlipidemia     elevated transaminases on statin?-40's (2/06) - Hep B/C negative 4/06; resolution on 4/06 labs; off statin in 2006  . Hypertension     concentric LVH 2d echo 12/05; negative proteinuria 11/00  . 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 Diastolic dysfunction     impaired L ventricle relatation with an EF of 65% and echo in December 2005  . Erectile dysfunction   . Onychomycosis April 2003    right big toe  . Chest pain     with a negative adenosine cardiolite  in December 2006. Dr. Robet Leu   . Tinea versicolor     dermatology referral; secondary to no treatement response to selenium sulfide; currently resolive  . 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 Wanda Plump in 9/05. no BPH at that visit   No family history on file. History   Social History  . Marital Status: Married    Spouse Name: N/A    Number of Children: N/A  . Years of Education: N/A   Occupational History  . Not on file.   Social History Main Topics  . Smoking status: Never Smoker   . Smokeless tobacco: Never Used  . Alcohol Use: Yes     Comment: ocassional use  . Drug Use: No     Comment: former cocaine abuse  . 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 Not on file   Social History Narrative  . No narrative on file   Review of Systems: General: Denies fever, chills, diaphoresis, appetite change and fatigue. HEENT: Denies photophobia, eye pain, redness, hearing loss, ear pain, congestion, sore throat, rhinorrhea, sneezing, mouth sores, trouble swallowing, neck pain, neck stiffness and tinnitus. Respiratory: Denies SOB, DOE, cough, chest tightness,  and wheezing. Cardiovascular: Denies to chest pain, palpitations and leg swelling. Gastrointestinal: Denies nausea, vomiting, abdominal pain, diarrhea, constipation, blood in stool and abdominal distention. Genitourinary: Denies dysuria, urgency, frequency, hematuria, flank pain and difficulty urinating. Musculoskeletal: Denies myalgias, back pain, joint swelling, arthralgias and gait problem.  Skin: Denies pallor, rash and wound. Neurological: Denies dizziness, seizures, syncope, weakness, light-headedness, numbness and headaches. Hematological: Denies adenopathy, easy bruising,  personal or family bleeding history. Psychiatric/Behavioral: Denies suicidal ideation, mood changes, confusion, nervousness, sleep disturbance and agitation.    Current Outpatient Medications: Current Outpatient Prescriptions  Medication Sig Dispense Refill  . amLODipine (NORVASC) 10 MG tablet TAKE ONE TABLET BY MOUTH ONE TIME DAILY  30 tablet  1  . atenolol (TENORMIN) 50 MG tablet TAKE ONE TABLET BY MOUTH ONE TIME DAILY  30 tablet  2  . doxazosin (CARDURA) 1 MG tablet TAKE ONE TABLET BY MOUTH ONE TIME DAILY  30 tablet  2  . hydrochlorothiazide (HYDRODIURIL) 25 MG tablet TAKE ONE TABLET BY MOUTH ONE TIME DAILY  30 tablet  2  . ibuprofen (ADVIL,MOTRIN) 200 MG tablet Take 1 tablet (200 mg total) by mouth every 8 (eight) hours as needed.  90 tablet  3  . lisinopril (PRINIVIL,ZESTRIL) 40 MG tablet TAKE ONE TABLET BY MOUTH ONE TIME DAILY  30 tablet  5  . nitroGLYCERIN (NITRODUR - DOSED IN MG/24 HR) 0.2 mg/hr Use 1/4 patch on your shoulder.  Replace daily.  30 patch  11  . pravastatin (PRAVACHOL) 40 MG tablet Take 1 tablet (40 mg total) by mouth daily.  30 tablet  10  . sildenafil (VIAGRA) 50 MG tablet Take 1 tablet (50 mg total) by mouth as needed for erectile dysfunction.  20 tablet  1  . tadalafil (CIALIS) 10 MG tablet Take 0.5 tablets (5 mg total) by mouth as needed for erectile dysfunction.  20 tablet  1   Current Facility-Administered Medications  Medication Dose Route Frequency Provider Last Rate Last Dose  . methylPREDNISolone acetate (DEPO-MEDROL) injection 80 mg  80 mg Intra-articular Once Sherrell Puller, MD        Allergies: No Known Allergies    Objective:   Physical Exam: Filed Vitals:   01/07/13 1126  BP: 151/102  Pulse: 85  Temp: 96.8 F (36 C)    General: Vital signs reviewed and noted. Well-developed, well-nourished, in no acute distress; alert, appropriate and cooperative throughout examination. Head: Normocephalic, atraumatic Lungs: Normal respiratory effort.  Clear to auscultation BL without crackles or wheezes. Heart: RRR. S1 and S2 normal without gallop, murmur, or rubs. Abdomen:BS normoactive. Soft, Nondistended, non-tender.  No masses or organomegaly. Extremities: No pretibial edema.     Assessment & Plan:

## 2013-01-07 NOTE — Assessment & Plan Note (Addendum)
Patient did not have any evidence of hepatitis or HIV on his recent blood draw. I spoke with Maggie at  [facility], where he went for blood donation but she could not find the exact reason why he was refused blood donation. She advised him to go to the place where he gave first blood donation but he has not done that to my knowledge. She advised me to ask the patient to give her a call directly.  Patient was given the number for Ms. Maggie to discuss his case. He did not answer his phone but message was left over the phone.

## 2013-01-07 NOTE — Patient Instructions (Addendum)
General Instructions: Please schedule a follow up appointment in 1 month for BP recheck . Please bring your medication bottles with your next appointment. Please take your medicines as prescribed. I will call you with your lab results if anything will be abnormal.    Treatment Goals:  Goals (1 Years of Data) as of 01/07/13         As of Today 01/03/13 01/03/13 11/20/12 06/06/12     Blood Pressure    . Blood Pressure < 140/90  151/102 178/119 165/100 123/88 148/97      Progress Toward Treatment Goals:  Treatment Goal 01/07/2013  Blood pressure improved    Self Care Goals & Plans:  Self Care Goal 01/07/2013  Manage my medications take my medicines as prescribed; bring my medications to every visit; refill my medications on time; follow the sick day instructions if I am sick  Monitor my health -  Eat healthy foods eat more vegetables; eat fruit for snacks and desserts; eat baked foods instead of fried foods; eat smaller portions  Be physically active take a walk every day; find an activity I enjoy       Care Management & Community Referrals:  Referral 01/07/2013  Referrals made for care management support none needed

## 2013-01-07 NOTE — Assessment & Plan Note (Addendum)
His blood pressure continues to be elevated with today's visit but has improved from last time. He has not taken any blood pressure medicines since his last clinic visit. Apparently, he did not have prescriptions when he went to his pharmacy. In our records 2 of the blood pressure medicines were sent on 01/04/13. He states that he will get his disability check on Wednesday and would be able to buy his medications. The need to be taking anti- HTN  meds was again emphasized. The fact of having recurrent stroke or damage to organs like heart , kidney etc from poor blood control was reiterated . He was given the information on MAP program. He was also seen by our social worker today.  - Would try to get him enrolled with [facility].

## 2013-01-08 LAB — FERRITIN: Ferritin: 408 ng/mL — ABNORMAL HIGH (ref 22–322)

## 2013-01-08 LAB — IRON AND TIBC: Iron: 74 ug/dL (ref 42–165)

## 2013-01-08 NOTE — Assessment & Plan Note (Signed)
DD includes iron deficiency vs thalassemia. H and H stable. Would discuss his referral to GI for screening colonoscopy with next appointment. - Would add iron studies to his labs.

## 2013-01-08 NOTE — Addendum Note (Signed)
Addended by: Elyse Jarvis on: 01/08/2013 10:53 AM   Modules accepted: Orders

## 2013-01-09 NOTE — Progress Notes (Signed)
[name] presents as a walk-in today to see CSW.  Pt requesting assistance with signing up for Medicaid.  Pt currently has Medicare and is on disability.  Pt's spouse is employed.  Pt has prescription coverage with Medicare but monthly co-pays are still too high.  Pt states he did not qualify for the ExtraHelp program.  CSW informed [name] he may be over-income for Medicaid, but CSW provided pt with the phone number to [facility]. Adult Medicaid.  CSW provided pt with the number to [facility] to explore other Medicare policies.  Pt denies add'l needs at this time.  Pt discussed with Dr. Dorthula Rue who would like referral to [facility].  CSW has referred pt to [facility].

## 2013-03-29 ENCOUNTER — Ambulatory Visit (INDEPENDENT_AMBULATORY_CARE_PROVIDER_SITE_OTHER): Payer: Medicare Other | Admitting: Internal Medicine

## 2013-03-29 ENCOUNTER — Encounter: Payer: Self-pay | Admitting: Internal Medicine

## 2013-03-29 VITALS — BP 152/108 | HR 59 | Temp 97.1°F | Ht 66.4 in | Wt 183.3 lb

## 2013-03-29 DIAGNOSIS — W57XXXA Bitten or stung by nonvenomous insect and other nonvenomous arthropods, initial encounter: Secondary | ICD-10-CM

## 2013-03-29 DIAGNOSIS — M25519 Pain in unspecified shoulder: Secondary | ICD-10-CM

## 2013-03-29 DIAGNOSIS — M1990 Unspecified osteoarthritis, unspecified site: Secondary | ICD-10-CM

## 2013-03-29 DIAGNOSIS — M25512 Pain in left shoulder: Secondary | ICD-10-CM

## 2013-03-29 DIAGNOSIS — I639 Cerebral infarction, unspecified: Secondary | ICD-10-CM

## 2013-03-29 DIAGNOSIS — I10 Essential (primary) hypertension: Secondary | ICD-10-CM

## 2013-03-29 DIAGNOSIS — Z8673 Personal history of transient ischemic attack (TIA), and cerebral infarction without residual deficits: Secondary | ICD-10-CM

## 2013-03-29 MED ORDER — PRAVASTATIN SODIUM 40 MG PO TABS: 40.0000 mg | ORAL_TABLET | Freq: Every day | ORAL | Status: DC

## 2013-03-29 MED ORDER — AMLODIPINE BESYLATE 10 MG PO TABS: ORAL_TABLET | ORAL | Status: DC

## 2013-03-29 MED ORDER — ASPIRIN 81 MG PO TABS: 81.0000 mg | ORAL_TABLET | Freq: Every day | ORAL | Status: DC

## 2013-03-29 MED ORDER — ATENOLOL 50 MG PO TABS: 50.0000 mg | ORAL_TABLET | Freq: Every day | ORAL | Status: DC

## 2013-03-29 MED ORDER — LISINOPRIL 40 MG PO TABS: ORAL_TABLET | ORAL | Status: DC

## 2013-03-29 MED ORDER — HYDROCHLOROTHIAZIDE 25 MG PO TABS: 25.0000 mg | ORAL_TABLET | Freq: Every day | ORAL | Status: DC

## 2013-03-29 NOTE — Progress Notes (Signed)
Subjective:   Patient ID: [name] male   DOB: 04-Oct-1961 52 y.o.   MRN: [id number]  HPI: [name] is a 52 y.o. male with past medications significant as outlined below who presented to the clinic after he reports removing to tick bites. He wanted to get evaluated for this. Patient was further reporting about shoulder pain in a pain which is chronic. For the shoulder pain patient had followup with sports medicine in the past who had performed steroid injection. X-ray of the knee was notable for mild osteoarthritis in the past.    Past Medical History  Diagnosis Date  . Hyperlipidemia     elevated transaminases on statin?-40's (2/06) - Hep B/C negative 4/06; resolution on 4/06 labs; off statin in 2006  . Hypertension     concentric LVH 2d echo 12/05; negative proteinuria 11/00  . 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 Diastolic dysfunction     impaired L ventricle relatation with an EF of 65% and echo in December 2005  . Erectile dysfunction   . Onychomycosis April 2003    right big toe  . Chest pain     with a negative adenosine cardiolite in December 2006. Dr. Robet Leu   . Tinea versicolor     dermatology referral; secondary to no treatement response to selenium sulfide; currently resolive  . 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 Wanda Plump in 9/05. no BPH at that visit   Current Outpatient Prescriptions  Medication Sig Dispense Refill  . amLODipine  (NORVASC) 10 MG tablet TAKE ONE TABLET BY MOUTH ONE TIME DAILY  30 tablet  1  . atenolol (TENORMIN) 50 MG tablet Take 1 tablet (50 mg total) by mouth daily.  30 tablet  2  . doxazosin (CARDURA) 1 MG tablet Take 1 tablet (1 mg total) by mouth at bedtime.  30 tablet  2  . hydrochlorothiazide (HYDRODIURIL) 25 MG tablet Take 1 tablet (25 mg total) by mouth daily.  30 tablet  2  . ibuprofen (ADVIL,MOTRIN) 200 MG tablet Take 1 tablet (200 mg total) by mouth every 8 (eight) hours as needed.  90 tablet  3  . lisinopril (PRINIVIL,ZESTRIL) 40 MG tablet TAKE ONE TABLET BY MOUTH ONE TIME DAILY  30 tablet  5  . nitroGLYCERIN (NITRODUR - DOSED IN MG/24 HR) 0.2 mg/hr Use 1/4 patch on your shoulder.  Replace daily.  30 patch  11  . pravastatin (PRAVACHOL) 40 MG tablet Take 1 tablet (40 mg total) by mouth daily.  30 tablet  10  . tadalafil (CIALIS) 10 MG tablet Take 0.5 tablets (5 mg total) by mouth as needed for erectile dysfunction.  20 tablet  1   Current Facility-Administered Medications  Medication Dose Route Frequency Provider Last Rate Last Dose  . methylPREDNISolone acetate (DEPO-MEDROL) injection 80 mg  80 mg Intra-articular Once Sherrell Puller, MD       No family history on file. History   Social History  . Marital Status: Married  Spouse Name: N/A    Number of Children: N/A  . Years of Education: N/A   Social History Main Topics  . Smoking status: Never Smoker   . Smokeless tobacco: Never Used  . Alcohol Use: Yes     Comment: ocassional use  . Drug Use: No     Comment: former cocaine abuse  . Sexually Active: None     Comment: recently finished [employer] associate degree, looking for job, on disability for now   Other Topics Concern  . None   Social History Narrative  . None   Review of Systems: Constitutional: Denies fever, chills, diaphoresis, appetite change and fatigue.   Respiratory: Denies SOB, DOE, cough, chest tightness,  and wheezing.   Cardiovascular: Denies  chest pain, palpitations and leg swelling.  Gastrointestinal: Denies nausea, vomiting, abdominal pain, diarrhea, constipation, blood in stool and abdominal distention.  Genitourinary: Denies dysuria, urgency, frequency, hematuria, flank pain and difficulty urinating. . Musculoskeletal: Noted shoulder pain as well as knee pain.  Neurological: Denies dizziness, Hematological: Denies adenopathy.   Objective:  Physical Exam: Filed Vitals:   03/29/13 1355  BP: 168/115  Pulse: 73  Temp: 97.1 F (36.2 C)  TempSrc: Oral  Height: 5' 6.4" (1.687 m)  Weight: 183 lb 4.8 oz (83.144 kg)  SpO2: 100%   Constitutional: Vital signs reviewed.  Patient is a well-developed and well-nourished male in no acute distress and cooperative with exam. Alert and oriented x3.  Mouth: no erythema or exudates, MMM Eyes: PERRL, EOMI, conjunctivae normal, No scleral icterus.  Neck: Supple,  Cardiovascular: RRR, S1 normal, S2 normal, no MRG, pulses symmetric and intact bilaterally Pulmonary/Chest: CTAB, no wheezes, rales, or rhonchi Abdominal: Soft. Non-tender, non-distended, bowel sounds are normal, Musculoskeletal:Left shoulder: tender to palpation, decreased ROM. Left knee: crepitus notable of left knee, tenderness on palpation on lateral aspect of knee Hematology: no cervical adenopathy.  Neurological: A&O x3,   Skin: left supraclavicular area: No erythema, no tenderness, no mass notable. Right leg anterior shin: Noted, no tenderness, no mass notable

## 2013-03-29 NOTE — Patient Instructions (Addendum)
If you experience any chest pain or shortness of breath, headaches please go directly to the emergency room for further evaluation and management.  If you experience any redness, swelling, fevers or chills please call the clinic for further instruction in management.

## 2013-04-01 DIAGNOSIS — W57XXXA Bitten or stung by nonvenomous insect and other nonvenomous arthropods, initial encounter: Secondary | ICD-10-CM | POA: Insufficient documentation

## 2013-04-01 NOTE — Assessment & Plan Note (Signed)
Patient will follow up with Sportsmedicine. Had injected received in past which improved his symptoms for at least 6 month

## 2013-04-01 NOTE — Assessment & Plan Note (Signed)
Informed patient that he needs to monitor for redness, pain, drainage, fevers or chills. Indication for antibiotic therapy at this point

## 2013-04-01 NOTE — Assessment & Plan Note (Signed)
Patient will follow up with Sportsmedicine

## 2013-04-01 NOTE — Assessment & Plan Note (Signed)
Blood pressure elevated likely due to medical non-compliance. Informed patient to take medication a regular basis in the setting of CVA

## 2013-04-05 ENCOUNTER — Ambulatory Visit (INDEPENDENT_AMBULATORY_CARE_PROVIDER_SITE_OTHER): Payer: Medicare Other | Admitting: Internal Medicine

## 2013-04-05 ENCOUNTER — Encounter: Payer: Self-pay | Admitting: Internal Medicine

## 2013-04-05 VITALS — BP 130/90 | HR 71 | Temp 97.4°F | Ht 66.4 in | Wt 183.0 lb

## 2013-04-05 DIAGNOSIS — Z7189 Other specified counseling: Secondary | ICD-10-CM

## 2013-04-05 DIAGNOSIS — I10 Essential (primary) hypertension: Secondary | ICD-10-CM

## 2013-04-05 DIAGNOSIS — Z1211 Encounter for screening for malignant neoplasm of colon: Secondary | ICD-10-CM | POA: Insufficient documentation

## 2013-04-05 LAB — BASIC METABOLIC PANEL WITH GFR
CO2: 26 mEq/L (ref 19–32)
Calcium: 9.3 mg/dL (ref 8.4–10.5)
Chloride: 102 mEq/L (ref 96–112)
Creat: 1.37 mg/dL — ABNORMAL HIGH (ref 0.50–1.35)
Glucose, Bld: 98 mg/dL (ref 70–99)
Sodium: 138 mEq/L (ref 135–145)

## 2013-04-05 NOTE — Assessment & Plan Note (Signed)
Will send patient for colonoscopy screening.

## 2013-04-05 NOTE — Progress Notes (Signed)
Subjective:   Patient ID: [name] male   DOB: 15-Sep-1961 52 y.o.   MRN: [id number]  HPI: [name] is a 52 y.o. male with past medical history significant as outlined below who presented to the clinic for followup for blood pressure. Patient is currently on lisinopril 40 g daily, hydrochlorothiazide 25 mg daily, atenolol 50 mg daily and amlodipine 10 mg daily. She reports he has been taking it now on a daily basis. Denies any chest pain, shortness of breath, dizziness    Past Medical History  Diagnosis Date  . Hyperlipidemia     elevated transaminases on statin?-40's (2/06) - Hep B/C negative 4/06; resolution on 4/06 labs; off statin in 2006  . Hypertension     concentric LVH 2d echo 12/05; negative proteinuria 11/00  . 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 Diastolic dysfunction     impaired L ventricle relatation with an EF of 65% and echo in December 2005  . Erectile dysfunction   . Onychomycosis April 2003    right big toe  . Chest pain     with a negative adenosine cardiolite in December 2006. Dr. Robet Leu   . Tinea versicolor     dermatology referral; secondary to no treatement response to selenium sulfide; currently resolive  . 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 Wanda Plump in 9/05. no BPH at that visit   Current Outpatient Prescriptions  Medication Sig Dispense Refill  . amLODipine (NORVASC) 10 MG tablet TAKE ONE TABLET BY MOUTH ONE TIME DAILY  30  tablet  2  . aspirin 81 MG tablet Take 1 tablet (81 mg total) by mouth daily.  30 tablet    . atenolol (TENORMIN) 50 MG tablet Take 1 tablet (50 mg total) by mouth daily.  30 tablet  2  . doxazosin (CARDURA) 1 MG tablet Take 1 tablet (1 mg total) by mouth at bedtime.  30 tablet  2  . hydrochlorothiazide (HYDRODIURIL) 25 MG tablet Take 1 tablet (25 mg total) by mouth daily.  30 tablet  2  . ibuprofen (ADVIL,MOTRIN) 200 MG tablet Take 1 tablet (200 mg total) by mouth every 8 (eight) hours as needed.  90 tablet  3  . lisinopril (PRINIVIL,ZESTRIL) 40 MG tablet TAKE ONE TABLET BY MOUTH ONE TIME DAILY  30 tablet  2  . nitroGLYCERIN (NITRODUR - DOSED IN MG/24 HR) 0.2 mg/hr Use 1/4 patch on your shoulder.  Replace daily.  30 patch  11  . pravastatin (PRAVACHOL) 40 MG tablet Take 1 tablet (40 mg total) by mouth daily.  30 tablet  2   Current Facility-Administered Medications  Medication Dose Route Frequency Provider Last Rate Last Dose  . methylPREDNISolone acetate (DEPO-MEDROL) injection 80 mg  80 mg Intra-articular Once Sherrell Puller, MD       No family history on file. History   Social History  . Marital Status: Married    Spouse Name: N/A    Number of Children: N/A  . Years of  Education: N/A   Social History Main Topics  . Smoking status: Never Smoker   . Smokeless tobacco: Never Used  . Alcohol Use: Yes     Comment: ocassional use  . Drug Use: No     Comment: former cocaine abuse  . Sexually Active: None     Comment: recently finished [employer] associate degree, looking for job, on disability for now   Other Topics Concern  . None   Social History Narrative  . None   Review of Systems: Constitutional: Denies fever, chills, diaphoresis, appetite change and fatigue.  Respiratory: Denies SOB, DOE, cough, chest tightness,  and wheezing.   Cardiovascular: Denies chest pain, palpitations and leg swelling.  Neurological: Denies dizziness  Objective:  Physical Exam: Filed  Vitals:   04/05/13 0953  BP: 130/90  Pulse: 71  Temp: 97.4 F (36.3 C)  TempSrc: Oral  Height: 5' 6.4" (1.687 m)  Weight: 183 lb (83.008 kg)  SpO2: 100%   Constitutional: Vital signs reviewed.  Patient is a well-developed and well-nourished male in no acute distress and cooperative with exam. Alert and oriented x3.  Neck: Supple,   Cardiovascular: RRR, S1 normal, S2 normal, no MRG, pulses symmetric and intact bilaterally Pulmonary/Chest: CTAB, no wheezes, rales, or rhonchi Abdominal: Soft. Non-tender, non-distended, bowel sounds are normal, Neurological: A&O x3,  Skin: Warm, dry and intact. No rash, cyanosis, or clubbing.

## 2013-04-05 NOTE — Patient Instructions (Signed)
  Keep up the good work !!! Continue to take your medications on a daily basis. If have any questions please call the clinic

## 2013-04-05 NOTE — Assessment & Plan Note (Signed)
Blood pressure significantly improved. Will continue lisinopril 40 mg daily, hydrochlorothiazide 25 mg daily, atenolol 50 months daily,  amlodipine 10 g daily

## 2013-04-09 NOTE — Progress Notes (Signed)
Case discussed with Dr. Jaseela Illath  at the time of the visit, immediately after the resident saw the patient.  I reviewed the resident's history and exam and pertinent patient test results.  I agree with the assessment, diagnosis and plan of care documented in the resident's note.   

## 2013-04-29 ENCOUNTER — Other Ambulatory Visit: Payer: Medicare Other

## 2013-04-29 DIAGNOSIS — Z1211 Encounter for screening for malignant neoplasm of colon: Secondary | ICD-10-CM

## 2013-04-29 LAB — POC HEMOCCULT BLD/STL (HOME/3-CARD/SCREEN)
Card #2 Fecal Occult Blod, POC: NEGATIVE
Card #3 Fecal Occult Blood, POC: NEGATIVE
Fecal Occult Blood, POC: NEGATIVE

## 2013-04-29 NOTE — Addendum Note (Signed)
Addended by: Remus Blake on: 04/29/2013 11:33 AM   Modules accepted: Orders

## 2013-05-06 ENCOUNTER — Encounter: Payer: Self-pay | Admitting: Internal Medicine

## 2013-05-07 ENCOUNTER — Encounter (HOSPITAL_COMMUNITY): Payer: Self-pay | Admitting: Pharmacy Technician

## 2013-05-07 ENCOUNTER — Encounter (HOSPITAL_COMMUNITY): Payer: Self-pay | Admitting: *Deleted

## 2013-05-15 ENCOUNTER — Ambulatory Visit (HOSPITAL_COMMUNITY): Admission: RE | Admit: 2013-05-15 | Payer: Medicare Other | Source: Ambulatory Visit | Admitting: Gastroenterology

## 2013-05-15 HISTORY — DX: Unspecified osteoarthritis, unspecified site: M19.90

## 2013-05-15 SURGERY — COLONOSCOPY WITH PROPOFOL
Anesthesia: Monitor Anesthesia Care

## 2013-05-21 ENCOUNTER — Other Ambulatory Visit: Payer: Self-pay | Admitting: Gastroenterology

## 2013-05-21 NOTE — Addendum Note (Signed)
Addended by: Gregor Dershem on: 05/21/2013 04:58 PM   Modules accepted: Orders  

## 2013-05-22 ENCOUNTER — Ambulatory Visit (HOSPITAL_COMMUNITY)
Admission: RE | Admit: 2013-05-22 | Discharge: 2013-05-22 | Disposition: A | Discharge status: Home / Self Care | Payer: Medicare Other | Source: Ambulatory Visit | Attending: Gastroenterology | Admitting: Gastroenterology

## 2013-05-22 ENCOUNTER — Encounter (HOSPITAL_COMMUNITY): Payer: Self-pay | Admitting: *Deleted

## 2013-05-22 ENCOUNTER — Encounter (HOSPITAL_COMMUNITY): Payer: Self-pay | Admitting: Anesthesiology

## 2013-05-22 ENCOUNTER — Ambulatory Visit (HOSPITAL_COMMUNITY): Payer: Medicare Other | Admitting: Anesthesiology

## 2013-05-22 ENCOUNTER — Encounter (HOSPITAL_COMMUNITY)
Admission: RE | Disposition: A | Discharge status: Home / Self Care | Payer: Self-pay | Source: Ambulatory Visit | Attending: Gastroenterology

## 2013-05-22 DIAGNOSIS — Z8673 Personal history of transient ischemic attack (TIA), and cerebral infarction without residual deficits: Secondary | ICD-10-CM | POA: Insufficient documentation

## 2013-05-22 DIAGNOSIS — D371 Neoplasm of uncertain behavior of stomach: Secondary | ICD-10-CM | POA: Insufficient documentation

## 2013-05-22 DIAGNOSIS — I10 Essential (primary) hypertension: Secondary | ICD-10-CM | POA: Insufficient documentation

## 2013-05-22 DIAGNOSIS — Z1211 Encounter for screening for malignant neoplasm of colon: Secondary | ICD-10-CM | POA: Insufficient documentation

## 2013-05-22 DIAGNOSIS — D378 Neoplasm of uncertain behavior of other specified digestive organs: Secondary | ICD-10-CM | POA: Insufficient documentation

## 2013-05-22 DIAGNOSIS — K5730 Diverticulosis of large intestine without perforation or abscess without bleeding: Secondary | ICD-10-CM | POA: Insufficient documentation

## 2013-05-22 HISTORY — PX: COLONOSCOPY WITH PROPOFOL: SHX5780

## 2013-05-22 SURGERY — COLONOSCOPY WITH PROPOFOL
Anesthesia: Monitor Anesthesia Care

## 2013-05-22 MED ORDER — LACTATED RINGERS IV SOLN
INTRAVENOUS | Status: DC
  Administered 2013-05-22: 1000 mL via INTRAVENOUS

## 2013-05-22 MED ORDER — KETAMINE HCL 10 MG/ML IJ SOLN
INTRAMUSCULAR | Status: DC | PRN
  Administered 2013-05-22: 20 mg via INTRAVENOUS

## 2013-05-22 MED ORDER — PROPOFOL 10 MG/ML IV BOLUS
INTRAVENOUS | Status: DC | PRN
  Administered 2013-05-22: 30 mg via INTRAVENOUS

## 2013-05-22 MED ORDER — LACTATED RINGERS IV SOLN
INTRAVENOUS | Status: DC | PRN
  Administered 2013-05-22: 11:00:00 via INTRAVENOUS

## 2013-05-22 MED ORDER — MIDAZOLAM HCL 5 MG/5ML IJ SOLN
INTRAMUSCULAR | Status: DC | PRN
  Administered 2013-05-22: 2 mg via INTRAVENOUS

## 2013-05-22 MED ORDER — SODIUM CHLORIDE 0.9 % IV SOLN: INTRAVENOUS | Status: DC

## 2013-05-22 MED ORDER — PROPOFOL INFUSION 10 MG/ML OPTIME
INTRAVENOUS | Status: DC | PRN
  Administered 2013-05-22: 100 ug/kg/min via INTRAVENOUS

## 2013-05-22 SURGICAL SUPPLY — 21 items

## 2013-05-22 NOTE — Op Note (Signed)
[facility] [address] [place], [address]   COLONOSCOPY PROCEDURE REPORT  PATIENT: [name], [name]  MR#: [id number] BIRTHDATE: 01-16-1961 , 52  yrs. old GENDER: Male ENDOSCOPIST: Willis Modena, MD REFERRED OZ:[id number] Rogelia Boga, M.D. PROCEDURE DATE:  05/22/2013 PROCEDURE:   Colonoscopy with snare polypectomy ASA CLASS:   Class III INDICATIONS:Colorectal cancer screening and Average risk patient for colon cancer. MEDICATIONS: MAC sedation, administered by CRNA  DESCRIPTION OF PROCEDURE:   After the risks benefits and alternatives of the procedure were thoroughly explained, informed consent was obtained.  A digital rectal exam revealed no abnormalities of the rectum.   The Pentax Ped Colon [id number] endoscope was introduced through the anus and advanced to the cecum, which was identified by both the appendix and ileocecal valve. No adverse events experienced.   The quality of the prep was good.  The instrument was then slowly withdrawn as the colon was fully examined.    Findings:  Digital rectal exam normal.  Prep quality good.  Two small polyps (sub-cm, ascending and descending) removed with snare cautery.  Few small sigmoid diverticula.  No other polyps, masses, vascular ectasias, or inflammatory changes were seen.   Retroflexed view of rectum was normal.   Withdrawal time was  about 10 minutes .  The scope was withdrawn and the procedure completed.  ENDOSCOPIC IMPRESSION:     As above.  Two small polyps removed.   RECOMMENDATIONS:     1.  Watch for potential complications of procedure. 2.  Await polypectomy results. 3.  Repeat colonoscopy in 5-10 years, pending polypectomy results. 4.  Follow-up with [facility] on as-needed basis.  eSigned:  Willis Modena, MD 05/22/2013 12:13 PM   cc:

## 2013-05-22 NOTE — H&P (Signed)
Patient interval history reviewed.  Patient examined again.  There has been no change from documented H/P dated 05/03/13 (scanned into chart from our office) except as documented above.  Assessment:  1.  Colon cancer screening, average-risk.  Plan:  1.  Colonoscopy with possible polypectomy. 2.  Risks (bleeding, infection, bowel perforation that could require surgery, sedation-related changes in cardiopulmonary systems), benefits (identification and possible treatment of source of symptoms, exclusion of certain causes of symptoms), and alternatives (watchful waiting, radiographic imaging studies, empiric medical treatment) of colonoscopy were explained to patient/wife in detail and patient wishes to proceed.

## 2013-05-22 NOTE — Transfer of Care (Signed)
Immediate Anesthesia Transfer of Care Note  Patient: [name]  Procedure(s) Performed: Procedure(s): COLONOSCOPY WITH PROPOFOL (N/A)  Patient Location: PACU  Anesthesia Type:MAC  Level of Consciousness: awake, alert  and oriented  Airway & Oxygen Therapy: Patient Spontanous Breathing and Patient connected to face mask oxygen  Post-op Assessment: Report given to PACU RN and Post -op Vital signs reviewed and stable  Post vital signs: Reviewed and stable  Complications: No apparent anesthesia complications

## 2013-05-22 NOTE — Anesthesia Preprocedure Evaluation (Addendum)
Anesthesia Evaluation  Patient identified by MRN, date of birth, ID band Patient awake    Reviewed: Allergy & Precautions, H&P , NPO status , Patient's Chart, lab work & pertinent test results  Airway Mallampati: II TM Distance: >3 FB Neck ROM: Full    Dental no notable dental hx.    Pulmonary neg pulmonary ROS,  breath sounds clear to auscultation  Pulmonary exam normal       Cardiovascular hypertension, Pt. on medications Rhythm:Regular Rate:Normal     Neuro/Psych  Neuromuscular disease CVA, Residual Symptoms negative neurological ROS  negative psych ROS   GI/Hepatic negative GI ROS, Neg liver ROS,   Endo/Other  negative endocrine ROS  Renal/GU negative Renal ROS  negative genitourinary   Musculoskeletal negative musculoskeletal ROS (+)   Abdominal   Peds negative pediatric ROS (+)  Hematology negative hematology ROS (+)   Anesthesia Other Findings   Reproductive/Obstetrics negative OB ROS                           Anesthesia Physical Anesthesia Plan  ASA: III  Anesthesia Plan: MAC   Post-op Pain Management:    Induction: Intravenous  Airway Management Planned:   Additional Equipment:   Intra-op Plan:   Post-operative Plan:   Informed Consent: I have reviewed the patients History and Physical, chart, labs and discussed the procedure including the risks, benefits and alternatives for the proposed anesthesia with the patient or authorized representative who has indicated his/her understanding and acceptance.   Dental advisory given  Plan Discussed with: CRNA  Anesthesia Plan Comments:         Anesthesia Quick Evaluation

## 2013-05-22 NOTE — Preoperative (Signed)
Beta Blockers   Reason not to administer Beta Blockers:Not Applicable 

## 2013-05-23 ENCOUNTER — Encounter (HOSPITAL_COMMUNITY): Payer: Self-pay | Admitting: Gastroenterology

## 2013-05-23 NOTE — Anesthesia Postprocedure Evaluation (Signed)
  Anesthesia Post-op Note  Patient: [name]  Procedure(s) Performed: Procedure(s) (LRB): COLONOSCOPY WITH PROPOFOL (N/A)  Patient Location: PACU  Anesthesia Type: MAC  Level of Consciousness: awake and alert   Airway and Oxygen Therapy: Patient Spontanous Breathing  Post-op Pain: mild  Post-op Assessment: Post-op Vital signs reviewed, Patient's Cardiovascular Status Stable, Respiratory Function Stable, Patent Airway and No signs of Nausea or vomiting  Last Vitals:  Filed Vitals:   05/22/13 1234  BP: 131/99  Pulse:   Temp:   Resp: 17    Post-op Vital Signs: stable   Complications: No apparent anesthesia complications

## 2013-06-07 ENCOUNTER — Encounter (HOSPITAL_COMMUNITY): Payer: Self-pay

## 2013-06-07 ENCOUNTER — Observation Stay (HOSPITAL_COMMUNITY)
Admission: EM | Admit: 2013-06-07 | Discharge: 2013-06-11 | DRG: 065 | Disposition: A | Discharge status: Rehabilitation Facility | Payer: Medicare Other | Attending: Internal Medicine | Admitting: Internal Medicine

## 2013-06-07 ENCOUNTER — Emergency Department (HOSPITAL_COMMUNITY): Payer: Medicare Other

## 2013-06-07 DIAGNOSIS — E785 Hyperlipidemia, unspecified: Secondary | ICD-10-CM | POA: Diagnosis present

## 2013-06-07 DIAGNOSIS — G8190 Hemiplegia, unspecified affecting unspecified side: Secondary | ICD-10-CM | POA: Diagnosis present

## 2013-06-07 DIAGNOSIS — M545 Low back pain, unspecified: Secondary | ICD-10-CM | POA: Diagnosis present

## 2013-06-07 DIAGNOSIS — I10 Essential (primary) hypertension: Secondary | ICD-10-CM | POA: Diagnosis present

## 2013-06-07 DIAGNOSIS — Z7982 Long term (current) use of aspirin: Secondary | ICD-10-CM

## 2013-06-07 DIAGNOSIS — G8929 Other chronic pain: Secondary | ICD-10-CM | POA: Diagnosis present

## 2013-06-07 DIAGNOSIS — F1411 Cocaine abuse, in remission: Secondary | ICD-10-CM | POA: Diagnosis present

## 2013-06-07 DIAGNOSIS — I639 Cerebral infarction, unspecified: Secondary | ICD-10-CM

## 2013-06-07 DIAGNOSIS — N182 Chronic kidney disease, stage 2 (mild): Secondary | ICD-10-CM | POA: Diagnosis present

## 2013-06-07 DIAGNOSIS — E876 Hypokalemia: Secondary | ICD-10-CM | POA: Diagnosis present

## 2013-06-07 DIAGNOSIS — N529 Male erectile dysfunction, unspecified: Secondary | ICD-10-CM | POA: Diagnosis present

## 2013-06-07 DIAGNOSIS — I129 Hypertensive chronic kidney disease with stage 1 through stage 4 chronic kidney disease, or unspecified chronic kidney disease: Secondary | ICD-10-CM | POA: Diagnosis present

## 2013-06-07 DIAGNOSIS — M436 Torticollis: Secondary | ICD-10-CM | POA: Diagnosis present

## 2013-06-07 DIAGNOSIS — G243 Spasmodic torticollis: Secondary | ICD-10-CM | POA: Diagnosis present

## 2013-06-07 DIAGNOSIS — Z79899 Other long term (current) drug therapy: Secondary | ICD-10-CM

## 2013-06-07 DIAGNOSIS — I6350 Cerebral infarction due to unspecified occlusion or stenosis of unspecified cerebral artery: Principal | ICD-10-CM

## 2013-06-07 DIAGNOSIS — E7800 Pure hypercholesterolemia, unspecified: Secondary | ICD-10-CM | POA: Diagnosis present

## 2013-06-07 DIAGNOSIS — Z8673 Personal history of transient ischemic attack (TIA), and cerebral infarction without residual deficits: Secondary | ICD-10-CM

## 2013-06-07 LAB — RAPID URINE DRUG SCREEN, HOSP PERFORMED
Amphetamines: NOT DETECTED
Barbiturates: NOT DETECTED
Benzodiazepines: NOT DETECTED
Tetrahydrocannabinol: NOT DETECTED

## 2013-06-07 LAB — COMPREHENSIVE METABOLIC PANEL
ALT: 33 U/L (ref 0–53)
Alkaline Phosphatase: 87 U/L (ref 39–117)
GFR calc Af Amer: 72 mL/min — ABNORMAL LOW (ref >90)
Glucose, Bld: 119 mg/dL — ABNORMAL HIGH (ref 70–99)
Potassium: 3 mEq/L — ABNORMAL LOW (ref 3.5–5.1)
Sodium: 138 mEq/L (ref 135–145)
Total Protein: 7.9 g/dL (ref 6.0–8.3)

## 2013-06-07 LAB — URINALYSIS, ROUTINE W REFLEX MICROSCOPIC
Ketones, ur: NEGATIVE mg/dL
Leukocytes, UA: NEGATIVE
Nitrite: NEGATIVE
Protein, ur: NEGATIVE mg/dL
Urobilinogen, UA: 0.2 mg/dL (ref 0.0–1.0)

## 2013-06-07 LAB — POCT I-STAT, CHEM 8
Calcium, Ion: 1.17 mmol/L (ref 1.12–1.23)
HCT: 43 % (ref 39.0–52.0)
TCO2: 26 mmol/L (ref 0–100)

## 2013-06-07 LAB — CBC
Hemoglobin: 13.3 g/dL (ref 13.0–17.0)
MCHC: 32.8 g/dL (ref 30.0–36.0)
RBC: 5.72 MIL/uL (ref 4.22–5.81)
WBC: 3.7 10*3/uL — ABNORMAL LOW (ref 4.0–10.5)

## 2013-06-07 LAB — DIFFERENTIAL
Basophils Relative: 1 % (ref 0–1)
Eosinophils Absolute: 0.3 10*3/uL (ref 0.0–0.7)
Eosinophils Relative: 9 % — ABNORMAL HIGH (ref 0–5)
Lymphocytes Relative: 41 % (ref 12–46)
Neutrophils Relative %: 35 % — ABNORMAL LOW (ref 43–77)

## 2013-06-07 LAB — APTT: aPTT: 26 seconds (ref 24–37)

## 2013-06-07 LAB — GLUCOSE, CAPILLARY: Glucose-Capillary: 109 mg/dL — ABNORMAL HIGH (ref 70–99)

## 2013-06-07 LAB — PROTIME-INR: INR: 1.06 (ref 0.00–1.49)

## 2013-06-07 MED ORDER — ASPIRIN 81 MG PO CHEW
324.0000 mg | CHEWABLE_TABLET | Freq: Once | ORAL | Status: AC
  Administered 2013-06-07: 324 mg via ORAL
  Filled 2013-06-07 (×2): qty 4

## 2013-06-07 NOTE — ED Notes (Signed)
Pt c/o R sided weakness and confusion.  Last seen normal at 1330.  Hx of CVA, with L sided weakness.  Denies pain and changes in vision.  A & Ox4.

## 2013-06-07 NOTE — Consult Note (Signed)
Neurology Consultation Reason for Consult: Right sided weakness Referring Physician: Ardeen Jourdain  CC: Right sided weakness  History is obtained from:Patient  HPI: [name] is a 52 y.o. male with a history of htn, hyperlipidemia,  Previous ICH who was in his normal state of health until he laid down for a nap at 1:30pm. He states that for the past few weaks he has felt generally weakn, but when he awoke from his nap he had new right sided weakness which he has not had before. He typically compensates for his left sided weakness with his right, but found himself unable to do so on awakening form his nap.   LKW: 1:30 pm tpa given: no mild new deficits NIHSS: 7(mostly old, only 2 new points)  ROS: A 14 point ROS was performed and is negative except as noted in the HPI.  Past Medical History  Diagnosis Date  . Hyperlipidemia     elevated transaminases on statin?-40's (2/06) - Hep B/C negative 4/06; resolution on 4/06 labs; off statin in 2006  . Hypertension     concentric LVH 2d echo 12/05; negative proteinuria 11/00  . 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 Diastolic dysfunction     impaired L ventricle relatation with an EF of 65% and echo in December 2005  . Onychomycosis April 2003    right big toe  . Chest pain     with a negative adenosine cardiolite in December 2006. Dr. Robet Leu   . Tinea versicolor     dermatology referral; secondary to no treatement response to selenium sulfide; currently resolive  . Prostate disorder     prostate irregularity with obstructive voiding symptoms (PSA 0.67);  -likely related to overstimulation of alpha receptors at bladder neck; seen by Dr. Wanda Plump in 9/05. no BPH at that visit  . Erectile dysfunction   . Arthritis     Family History: No hx cva  Social History: Tob: denies  Exam: Current vital signs: BP 130/97  Pulse 73  Temp(Src) 97.5 F (36.4 C) (Oral)  Resp 15  SpO2 98% Vital signs in last 24 hours: Temp:  [97.5 F (36.4 C)] 97.5 F (36.4 C) (07/25 1717) Pulse Rate:  [73-77] 73 (07/25 1750) Resp:  [15-20] 15 (07/25 1750) BP: (130-136)/(96-97) 130/97 mmHg (07/25 1750) SpO2:  [98 %-100 %] 98 % (07/25 1750)  General: in bed, NAD CV: Regular rate and rhythm Mental Status: Patient is awake, alert, oriented to person, place, month, year, and situation. Immediate and remote memory are intact. Patient is able to give a clear and coherent history. No signs of aphasia or neglect Cranial Nerves: II: Visual Fields are full. Pupils are equal, round, and reactive to light.  Discs are difficult to visualize. III,IV, VI: EOMI without ptosis or diploplia.  V: Facial sensation is symmetric to temperature VII: Facial movement is notable for left droop.  VIII: hearing is intact to voice X: Uvula elevates symmetrically XI: Shoulder shrug is symmetric. XII: tongue is midline without atrophy or fasciculations.  Motor: Tone is spastic in his left upper extremity. Bulk is normal. He has 4/5 strength in  his left arm, but significant spasticity. Likewise 4/5 strength in left leg. He has 4+/5 strength in his right arm with a mild pronator drift, minimal weakness right leg Sensory: Sensation is diminished on left  Deep Tendon Reflexes: Slightly increased in the left biceps and patella compared to the right Cerebellar: FNF consistent with weakness on right, as well as left  Gait: Not tested secondary to weakness  I have reviewed labs in epic and the results pertinent to this consultation are: chem 8-hypokalemia Mild leukopenia  I have  reviewed the images obtained: CT head-previous strokes bilaterally  Impression: 52 year old male with new onset of right-sided weakness while asleep. With the sudden change, I suspect a small subcortical infarct likely secondary to his high blood pressure, high cholesterol.  Recommendations: 1. HgbA1c, fasting lipid panel 2. MRI, MRA  of the brain without contrast 3. Frequent neuro checks 4. Echocardiogram 5. Carotid dopplers 6. Prophylactic therapy-Antiplatelet med: Aspirin - dose 325mg  7. Risk factor modification 8. Telemetry monitoring 9. PT consult, OT consult, Speech consult    Ritta Slot, MD [facility] [phone]  If 7pm- 7am, please page neurology on call at [phone].

## 2013-06-07 NOTE — ED Notes (Signed)
RECEIVED A BED CONFIRMATION@2012 

## 2013-06-07 NOTE — Progress Notes (Signed)
Patient activated My Chart. Briscoe Burns BSN, RN-BC Admissions RN  06/07/2013 5:58 PM

## 2013-06-07 NOTE — ED Provider Notes (Signed)
CSN: [id number]     Arrival date & time 06/07/13  1659 History     First MD Initiated Contact with Patient 06/07/13 1710     Chief Complaint  Patient presents with  . Stroke Symptoms   (Consider location/radiation/quality/duration/timing/severity/associated sxs/prior Treatment) HPI Comments: Patient presents with right-sided weakness. He has a history of a hemorrhagic stroke in 1999 and is left basal ganglia hemorrhage in 2005. He has resulting left-sided weakness from his past strokes.. The past strokes were apparently related to cocaine abuse. He presents today with right-sided weakness. He states that he was mowing the lawn earlier and his wife got home at 1:30 and he appeared to be normal at that point. He states he took a nap and when he woke up at 4:30 he noticed that he could not stand up. He states his right side normally compensates for his left side and he felt that his right side was not supporting his weight. He says he feels slightly weaker on his right side as compared to the left. He denies any speech difficulties he denies any vision changes. He has chronic numbness or weakness in his left arm and left leg which is unchanged from his baseline. He is noted to have some facial drooping which his wife said is chronic and unchanged from his baseline. He denies any current substance abuse.   Past Medical History  Diagnosis Date  . Hyperlipidemia     elevated transaminases on statin?-40's (2/06) - Hep B/C negative 4/06; resolution on 4/06 labs; off statin in 2006  . Hypertension     concentric LVH 2d echo 12/05; negative proteinuria 11/00  . 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 Diastolic dysfunction     impaired L ventricle relatation with an EF of 65% and echo in December 2005  . Onychomycosis April 2003    right big toe  . Chest pain     with a negative adenosine cardiolite in December 2006. Dr. Robet Leu   . Tinea versicolor     dermatology referral; secondary to no treatement response to selenium sulfide; currently resolive  . 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 Wanda Plump in 9/05. no BPH at that visit  . Erectile dysfunction   . Arthritis    Past Surgical History  Procedure Laterality Date  . Colonoscopy with propofol N/A 05/22/2013    Procedure: COLONOSCOPY WITH PROPOFOL;  Surgeon: Willis Modena, MD;  Location: [facility];  Service: Endoscopy;  Laterality: N/A;   History reviewed. No pertinent family history. History  Substance Use Topics  . Smoking status: Never Smoker   . Smokeless tobacco: Never Used  . Alcohol Use: Yes     Comment: ocassional use    Review of Systems  Constitutional: Negative for fever, chills, diaphoresis and fatigue.  HENT: Negative for congestion, rhinorrhea and sneezing.   Eyes: Negative.   Respiratory: Negative for cough, chest tightness and shortness of breath.   Cardiovascular: Negative for chest pain and leg swelling.  Gastrointestinal: Negative  for nausea, vomiting, abdominal pain, diarrhea and blood in stool.  Genitourinary: Negative for frequency, hematuria, flank pain and difficulty urinating.  Musculoskeletal: Negative for back pain and arthralgias.  Skin: Negative for rash.  Neurological: Positive for weakness and numbness. Negative for dizziness, speech difficulty and headaches.    Allergies  Review of patient's allergies indicates no known allergies.  Home Medications   Current Outpatient Rx   Name  Route  Sig  Dispense  Refill  . amLODipine (NORVASC) 10 MG tablet   Oral   Take 10 mg by mouth every morning.         Marland Kitchen aspirin EC 81 MG tablet   Oral   Take 81 mg by mouth every morning.          Marland Kitchen atenolol (TENORMIN) 50 MG tablet   Oral   Take 50 mg by mouth every morning.         . hydrochlorothiazide (HYDRODIURIL) 25 MG tablet   Oral   Take 25 mg by mouth every morning.         Marland Kitchen lisinopril (PRINIVIL,ZESTRIL) 40 MG tablet   Oral   Take 40 mg by mouth every morning.         . Multiple Vitamins-Minerals (HM MULTIVITAMIN ADULT GUMMY PO)   Oral   Take 1 tablet by mouth every morning.          BP 130/97  Pulse 73  Temp(Src) 97.5 F (36.4 C) (Oral)  Resp 15  SpO2 98% Physical Exam  Constitutional: He is oriented to person, place, and time. He appears well-developed and well-nourished.  HENT:  Head: Normocephalic and atraumatic.  Eyes: Pupils are equal, round, and reactive to light.  Neck: Normal range of motion. Neck supple.  Cardiovascular: Normal rate, regular rhythm and normal heart sounds.   Pulmonary/Chest: Effort normal and breath sounds normal. No respiratory distress. He has no wheezes. He has no rales. He exhibits no tenderness.  Abdominal: Soft. Bowel sounds are normal. There is no tenderness. There is no rebound and no guarding.  Musculoskeletal: Normal range of motion. He exhibits no edema.  Lymphadenopathy:    He has no cervical adenopathy.  Neurological: He is alert and oriented to person, place, and time.  Skin: Skin is warm and dry. No rash noted.  Psychiatric: He has a normal mood and affect.    ED Course   Procedures (including critical care time)  Results for orders placed during the hospital encounter of 06/07/13  ETHANOL      Result Value Range   Alcohol, Ethyl (B) <11  0 - 11 mg/dL  PROTIME-INR      Result Value Range   Prothrombin Time 13.6  11.6 - 15.2 seconds   INR 1.06  0.00 - 1.49  APTT      Result Value Range    aPTT 26  24 - 37 seconds  CBC      Result Value Range   WBC 3.7 (*) 4.0 - 10.5 K/uL   RBC 5.72  4.22 - 5.81 MIL/uL   Hemoglobin 13.3  13.0 - 17.0 g/dL   HCT 16.1  09.6 - 04.5 %   MCV 71.0 (*) 78.0 - 100.0 fL   MCH 23.3 (*) 26.0 - 34.0 pg   MCHC 32.8  30.0 - 36.0 g/dL   RDW 40.9  81.1 - 91.4 %   Platelets 211  150 - 400 K/uL  DIFFERENTIAL      Result Value Range   Neutrophils  Relative % 35 (*) 43 - 77 %   Lymphocytes Relative 41  12 - 46 %   Monocytes Relative 14 (*) 3 - 12 %   Eosinophils Relative 9 (*) 0 - 5 %   Basophils Relative 1  0 - 1 %   Neutro Abs 1.3 (*) 1.7 - 7.7 K/uL   Lymphs Abs 1.6  0.7 - 4.0 K/uL   Monocytes Absolute 0.5  0.1 - 1.0 K/uL   Eosinophils Absolute 0.3  0.0 - 0.7 K/uL   Basophils Absolute 0.0  0.0 - 0.1 K/uL   Smear Review MORPHOLOGY UNREMARKABLE    COMPREHENSIVE METABOLIC PANEL      Result Value Range   Sodium 138  135 - 145 mEq/L   Potassium 3.0 (*) 3.5 - 5.1 mEq/L   Chloride 98  96 - 112 mEq/L   CO2 28  19 - 32 mEq/L   Glucose, Bld 119 (*) 70 - 99 mg/dL   BUN 15  6 - 23 mg/dL   Creatinine, Ser 1.61  0.50 - 1.35 mg/dL   Calcium 9.8  8.4 - 09.6 mg/dL   Total Protein 7.9  6.0 - 8.3 g/dL   Albumin 3.8  3.5 - 5.2 g/dL   AST 26  0 - 37 U/L   ALT 33  0 - 53 U/L   Alkaline Phosphatase 87  39 - 117 U/L   Total Bilirubin 0.5  0.3 - 1.2 mg/dL   GFR calc non Af Amer 62 (*) >90 mL/min   GFR calc Af Amer 72 (*) >90 mL/min  TROPONIN I      Result Value Range   Troponin I <0.30  <0.30 ng/mL  GLUCOSE, CAPILLARY      Result Value Range   Glucose-Capillary 109 (*) 70 - 99 mg/dL  POCT I-STAT, CHEM 8      Result Value Range   Sodium 140  135 - 145 mEq/L   Potassium 3.1 (*) 3.5 - 5.1 mEq/L   Chloride 102  96 - 112 mEq/L   BUN 16  6 - 23 mg/dL   Creatinine, Ser 0.45  0.50 - 1.35 mg/dL   Glucose, Bld 409 (*) 70 - 99 mg/dL   Calcium, Ion 8.11  9.14 - 1.23 mmol/L   TCO2 26  0 - 100 mmol/L   Hemoglobin 14.6  13.0 - 17.0 g/dL   HCT 78.2  95.6 - 21.3 %   POCT I-STAT TROPONIN I      Result Value Range   Troponin i, poc 0.00  0.00 - 0.08 ng/mL   Comment 3            Ct Head Wo Contrast  06/07/2013   *RADIOLOGY REPORT*  Clinical Data: Right-sided weakness and confusion, history of CVA  CT HEAD WITHOUT CONTRAST  Technique:  Contiguous axial images were obtained from the base of the skull through the vertex without contrast.  Comparison: 01/09/2007; 01/07/2007; brain MRI - 01/08/2007  Findings:  Redemonstrated encephalomalacia within the right basal ganglia with associated asymmetric right-sided centralized volume loss and ex vacuo dilatation of the right lateral ventricle.  Interval resolution of previously noted left basal ganglial intracranial hemorrhage with residual encephalomalacia at this location.  There is persistent rather extensive periventricular hypodensity suggestive of microvascular ischemic disease.  Given the extensive background parenchymal abnormalities, there is no CT evidence of acute large territory infarct.  No intraparenchymal, extra-axial mass or hemorrhage.  There is unchanged chronic minimal left to right midline shift measuring approximately  3 mm in diameter. Limited visualization of the paranasal sinuses and mastoid air cells are normal.  Regional soft tissues are normal.  No displaced calvarial fracture.  IMPRESSION: 1.  No acute intracranial process.  2.  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  Above findings discussed with Dr. Fredderick Phenix at [phone].   Original Report Authenticated By: Tacey Ruiz, MD    Date: 06/07/2013  Rate: 77  Rhythm: normal sinus rhythm  QRS Axis: normal  Intervals: normal  ST/T Wave abnormalities: normal  Conduction Disutrbances:none  Narrative Interpretation:   Old EKG Reviewed: unchanged    1. CVA (cerebral infarction)     MDM  Patient states his symptoms seem to be improving. I discussed the case with Dr. Roseanne Reno who will see  the patient on admission. I contacted the internal medicine teaching service she will admit the patient to [facility] cone. Patient will be transferred to [facility]. On arrival I got an NIH stroke scale of 2. Given this mildness of symptoms with associated prior intracranial stroke, I felt the patient would not meet criteria for tPA and a code stroke was not officially called although he did have stat head CT and labs.   Rolan Bucco, MD 06/07/13 1911

## 2013-06-08 ENCOUNTER — Observation Stay (HOSPITAL_COMMUNITY): Payer: Medicare Other

## 2013-06-08 LAB — CBC
HCT: 36.5 % — ABNORMAL LOW (ref 39.0–52.0)
Hemoglobin: 12.6 g/dL — ABNORMAL LOW (ref 13.0–17.0)
RBC: 5.22 MIL/uL (ref 4.22–5.81)

## 2013-06-08 LAB — HEMOGLOBIN A1C
Hgb A1c MFr Bld: 6 % — ABNORMAL HIGH (ref <5.7)
Mean Plasma Glucose: 126 mg/dL — ABNORMAL HIGH (ref <117)

## 2013-06-08 LAB — CREATININE, SERUM
Creatinine, Ser: 1.24 mg/dL (ref 0.50–1.35)
GFR calc non Af Amer: 66 mL/min — ABNORMAL LOW (ref >90)

## 2013-06-08 LAB — LIPID PANEL
Cholesterol: 260 mg/dL — ABNORMAL HIGH (ref 0–200)
HDL: 52 mg/dL (ref >39)
Triglycerides: 105 mg/dL (ref <150)

## 2013-06-08 MED ORDER — HM MULTIVITAMIN ADULT GUMMY PO CHEW: CHEWABLE_TABLET | Freq: Every day | ORAL | Status: DC

## 2013-06-08 MED ORDER — POTASSIUM CHLORIDE CRYS ER 20 MEQ PO TBCR
40.0000 meq | EXTENDED_RELEASE_TABLET | Freq: Two times a day (BID) | ORAL | Status: AC
  Administered 2013-06-08: 40 meq via ORAL
  Filled 2013-06-08: qty 2

## 2013-06-08 MED ORDER — SENNOSIDES-DOCUSATE SODIUM 8.6-50 MG PO TABS: 1.0000 | ORAL_TABLET | Freq: Every evening | ORAL | Status: DC | PRN

## 2013-06-08 MED ORDER — HEPARIN SODIUM (PORCINE) 5000 UNIT/ML IJ SOLN
5000.0000 [IU] | Freq: Three times a day (TID) | INTRAMUSCULAR | Status: DC
  Administered 2013-06-08 – 2013-06-10 (×8): 5000 [IU] via SUBCUTANEOUS
  Filled 2013-06-08 (×10): qty 1

## 2013-06-08 MED ORDER — ASPIRIN EC 325 MG PO TBEC
325.0000 mg | DELAYED_RELEASE_TABLET | Freq: Every day | ORAL | Status: DC
  Administered 2013-06-08 – 2013-06-11 (×4): 325 mg via ORAL
  Filled 2013-06-08 (×4): qty 1

## 2013-06-08 MED ORDER — ATORVASTATIN CALCIUM 20 MG PO TABS: 20.0000 mg | ORAL_TABLET | Freq: Every day | ORAL | Status: DC

## 2013-06-08 MED ORDER — ADULT MULTIVITAMIN W/MINERALS CH
1.0000 | ORAL_TABLET | Freq: Every day | ORAL | Status: DC
  Administered 2013-06-08 – 2013-06-11 (×4): 1 via ORAL
  Filled 2013-06-08 (×4): qty 1

## 2013-06-08 MED ORDER — LORAZEPAM 0.5 MG PO TABS
0.5000 mg | ORAL_TABLET | Freq: Once | ORAL | Status: AC
  Administered 2013-06-08: 0.5 mg via ORAL
  Filled 2013-06-08: qty 1

## 2013-06-08 MED ORDER — POTASSIUM CHLORIDE CRYS ER 20 MEQ PO TBCR
40.0000 meq | EXTENDED_RELEASE_TABLET | Freq: Two times a day (BID) | ORAL | Status: DC
  Administered 2013-06-08: 40 meq via ORAL
  Filled 2013-06-08 (×3): qty 2

## 2013-06-08 NOTE — H&P (Signed)
Date: 06/08/2013               Patient Name:  [name] MRN: [id number]  DOB: 1961/02/17 Age / Sex: 52 y.o., male   PCP: Lorretta Harp, MD         Medical Service: Internal Medicine Teaching Service         Attending Physician: Dr. Jonah Blue, DO    First Contact: Dr. Carlynn Purl, DO Pager: [phone]  Second Contact: Dr. 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  He was subsequently transferred here.  His wife states had lunch around 1:30pm and he was fine.  He took a nap shortly after eating and woke up around 4:30pm unable to get out of bed due to right-sided weakness.  She was able to get him up and walk him to the bathroom.  She called EMS at that time.  He has some residual left-sided weakness due to his previous ICH but has been compensating with his right side.  He denies any CP, SOB, slurred speech, changes in vision, or difficulty swallowing.  He admits to some chronic low back pain but no other complaints.     Meds:  Facility-administered medications prior to admission  Medication Dose Route Frequency Provider Last Rate Last Dose  . methylPREDNISolone acetate (DEPO-MEDROL) injection 80 mg  80 mg Intra-articular Once Sherrell Puller, MD       Prescriptions prior to admission  Medication Sig Dispense Refill  . amLODipine (NORVASC) 10 MG tablet Take 10 mg by mouth every morning.      Marland Kitchen aspirin EC 81 MG tablet Take 81 mg by mouth every morning.       Marland Kitchen atenolol (TENORMIN) 50 MG tablet Take 50 mg by mouth every morning.      . hydrochlorothiazide (HYDRODIURIL) 25 MG tablet Take 25 mg by mouth every morning.      Marland Kitchen lisinopril (PRINIVIL,ZESTRIL) 40 MG tablet  Take 40 mg by mouth every morning.      . Multiple Vitamins-Minerals (HM MULTIVITAMIN ADULT GUMMY PO) Take 1 tablet by mouth every morning.        Current Facility-Administered Medications  Medication Dose Route Frequency Provider Last Rate Last Dose  . aspirin EC tablet 325 mg  325 mg Oral Daily Ritta Slot, MD      . heparin injection 5,000 Units  5,000 Units Subcutaneous Q8H Christen Bame, MD      . multivitamin with minerals tablet 1 tablet  1 tablet Oral Daily Jonah Blue, DO      . potassium chloride SA (K-DUR,KLOR-CON) CR tablet 40 mEq  40 mEq Oral BID Christen Bame, MD   40 mEq at 06/08/13 0144  . senna-docusate (Senokot-S) tablet 1 tablet  1 tablet Oral QHS PRN Christen Bame, MD        Allergies: Allergies as of 06/07/2013  . (No Known Allergies)   Past Medical History  Diagnosis Date  . Hyperlipidemia     elevated transaminases on statin?-40's (2/06) - Hep B/C negative 4/06; resolution on 4/06 labs; off statin in 2006  . Hypertension     concentric LVH 2d echo  12/05; negative proteinuria 11/00  . 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 Diastolic dysfunction     impaired L ventricle relatation with an EF of 65% and echo in December 2005  . Onychomycosis April 2003    right big toe  . Chest pain     with a negative adenosine cardiolite in December 2006. Dr. Robet Leu   . Tinea versicolor     dermatology referral; secondary to no treatement response to selenium sulfide; currently resolive  . Prostate disorder     prostate irregularity with obstructive voiding symptoms (PSA 0.67); -likely related  to overstimulation of alpha receptors at bladder neck; seen by Dr. Wanda Plump in 9/05. no BPH at that visit  . Erectile dysfunction   . Arthritis    Past Surgical History  Procedure Laterality Date  . Colonoscopy with propofol N/A 05/22/2013    Procedure: COLONOSCOPY WITH PROPOFOL;  Surgeon: Willis Modena, MD;  Location: [facility];  Service: Endoscopy;  Laterality: N/A;   History reviewed. No pertinent family history. History   Social History  . Marital Status: Married    Spouse Name: N/A    Number of Children: N/A  . Years of Education: N/A   Occupational History  . Not on file.   Social History Main Topics  . Smoking status: Never Smoker   . Smokeless tobacco: Never Used  . Alcohol Use: Yes     Comment: ocassional use  . Drug Use: No     Comment: former cocaine abuse, used marjuana 1999  . 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 Not on file   Social History Narrative  . No narrative on file    Review of Systems: Pertinent items are noted in HPI.  Physical Exam:  Vital Signs: BP:    136/96 Pulse:   77 Temp:   97.5 F (oral) RR:    20 O2 Sat:   100% on RA   Physical Exam  Constitutional: He is oriented to person, place, and time and well-developed, well-nourished, and in no distress.  HENT:  Head: Normocephalic and atraumatic.  Eyes: Conjunctivae and EOM are normal. Pupils are equal, round, and reactive to light.  Neck: Neck supple.  Cardiovascular: Normal rate, regular rhythm, normal heart sounds and intact distal pulses.   Pulmonary/Chest: Effort normal and breath sounds normal.  Abdominal: Soft. Bowel sounds are normal.  Neurological: He is alert and oriented to person, place, and time. He displays weakness (RUE: 4/5; RLE 4/5; LUE: 5/5; LLE 5/5) and facial asymmetry (slight droop on the left side). He displays normal speech. A sensory  deficit (decreased on the left side) is present.  Skin: Skin is warm and dry.     Lab results: Basic Metabolic Panel:  Recent Labs  16/10/96 1731 06/07/13 1737 06/08/13 0040  NA 138 140  --   K 3.0* 3.1*  --   CL 98 102  --   CO2 28  --   --   GLUCOSE 119* 122*  --  BUN 15 16  --   CREATININE 1.30 1.30 1.24  CALCIUM 9.8  --   --    Liver Function Tests:  Recent Labs  06/07/13 1731  AST 26  ALT 33  ALKPHOS 87  BILITOT 0.5  PROT 7.9  ALBUMIN 3.8   CBC:  Recent Labs  06/07/13 1731 06/07/13 1737 06/08/13 0040  WBC 3.7*  --  3.9*  NEUTROABS 1.3*  --   --   HGB 13.3 14.6 12.6*  HCT 40.6 43.0 36.5*  MCV 71.0*  --  69.9*  PLT 211  --  196   Cardiac Enzymes:  Recent Labs  06/07/13 1731  TROPONINI <0.30   CBG:  Recent Labs  06/07/13 1740  GLUCAP 109*   Fasting Lipid Panel:  Recent Labs  06/08/13 0040  CHOL 260*  HDL 52  LDLCALC 187*  TRIG 105  CHOLHDL 5.0   Coagulation:  Recent Labs  06/07/13 1731  LABPROT 13.6  INR 1.06   Urine Drug Screen: Drugs of Abuse     Component Value Date/Time   LABOPIA NONE DETECTED 06/07/2013 1844   COCAINSCRNUR NONE DETECTED 06/07/2013 1844   LABBENZ NONE DETECTED 06/07/2013 1844   AMPHETMU NONE DETECTED 06/07/2013 1844   THCU NONE DETECTED 06/07/2013 1844   LABBARB NONE DETECTED 06/07/2013 1844    Alcohol Level:  Recent Labs  06/07/13 1731  ETH <11   Urinalysis:  Recent Labs  06/07/13 1844  COLORURINE YELLOW  LABSPEC 1.022  PHURINE 6.0  GLUCOSEU NEGATIVE  HGBUR NEGATIVE  BILIRUBINUR NEGATIVE  KETONESUR NEGATIVE  PROTEINUR NEGATIVE  UROBILINOGEN 0.2  NITRITE NEGATIVE  LEUKOCYTESUR NEGATIVE    Imaging results:  Ct Head Wo Contrast  06/07/2013   *RADIOLOGY REPORT*  Clinical Data: Right-sided weakness and confusion, history of CVA  CT HEAD WITHOUT CONTRAST  Technique:  Contiguous axial images were obtained from the base of the skull through the vertex without contrast.  Comparison:  01/09/2007; 01/07/2007; brain MRI - 01/08/2007  Findings:  Redemonstrated encephalomalacia within the right basal ganglia with associated asymmetric right-sided centralized volume loss and ex vacuo dilatation of the right lateral ventricle.  Interval resolution of previously noted left basal ganglial intracranial hemorrhage with residual encephalomalacia at this location.  There is persistent rather extensive periventricular hypodensity suggestive of microvascular ischemic disease.  Given the extensive background parenchymal abnormalities, there is no CT evidence of acute large territory infarct.  No intraparenchymal, extra-axial mass or hemorrhage.  There is unchanged chronic minimal left to right midline shift measuring approximately 3 mm in diameter. Limited visualization of the paranasal sinuses and mastoid air cells are normal.  Regional soft tissues are normal.  No displaced calvarial fracture.  IMPRESSION: 1.  No acute intracranial process.  2.  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  Above findings discussed with Dr. Fredderick Phenix at [phone].   Original Report Authenticated By: Tacey Ruiz, MD    Other results: EKG: normal sinus rhythm.  Assessment & Plan by Problem:  Mr. Zollner is a 52 yo AAM with a PMH of HTN, ICH (x2 1999/2005), and hyperlipidemia who presents to the [facility] ED with right-sided weakness and was subsequently transferred here.    1. Right Sided Weakness: Pt presented to [facility] ED last seen normal 1:30pm with right sided weakness. ABCD2 TIA score: 4. Pt had ICH with left sided deficits thought to be secondary to cocaine abuse. CT head negative and UDS negative.  TIA seems less likely given  the duration of his symptoms and signs.  -neurology consult  -telemetry -MRI/MRA  -echo,carotid dopplers  -FLP, HA1C  -PT/OT/SLP -Statin, ASA  Dispo: Disposition is deferred at this time, awaiting improvement of current medical  problems. Anticipated discharge in approximately 3-4 day(s).   The patient does have a current PCP Lorretta Harp, MD) and does need an [facility] hospital follow-up appointment after discharge.  Signed: Boykin Peek, MD 06/08/2013, 4:42 AM

## 2013-06-08 NOTE — Progress Notes (Signed)
Stroke Team Progress Note  HISTORY HPI: [name] is a 52 y.o. male with a history of htn, hyperlipidemia, Previous ICH who was in his normal state of health until he laid down for a nap at 1:30pm on 06/07/2013 He stated that for the past few weeks he has felt generally weak, but when he awoke from his nap he had new right sided weakness which he had not had before. He typically compensates for his left sided weakness with his right, but found himself unable to do so on awakening form his nap.  LKW: 1:30 pm 06/07/2013 tpa given: no mild new deficits  NIHSS: 7(mostly old, only 2 new points)   SUBJECTIVE The patient's wife was in the room this morning. The patient has not noted any significant improvement since admission.  OBJECTIVE Most recent Vital Signs: Filed Vitals:   06/07/13 2126 06/07/13 2235 06/08/13 0217 06/08/13 0525  BP: 132/107 137/85 115/70 129/95  Pulse:  76 66 77  Temp:  97.6 F (36.4 C) 97.9 F (36.6 C) 97.9 F (36.6 C)  TempSrc:  Oral Oral Oral  Resp: 15 16 18 18   Height:  5\' 6"  (1.676 m)    Weight:  81.557 kg (179 lb 12.8 oz)    SpO2: 98% 97% 99% 98%   CBG (last 3)   Recent Labs  06/07/13 1740  GLUCAP 109*    IV Fluid Intake:     MEDICATIONS  . aspirin EC  325 mg Oral Daily  . heparin  5,000 Units Subcutaneous Q8H  . multivitamin with minerals  1 tablet Oral Daily  . potassium chloride  40 mEq Oral BID   PRN:  senna-docusate  Diet:  Cardiac thin liquids Activity: Up with assistance DVT Prophylaxis:  Subcutaneous heparin and SCDs  CLINICALLY SIGNIFICANT STUDIES Basic Metabolic Panel:  Recent Labs Lab 06/07/13 1731 06/07/13 1737 06/08/13 0040  NA 138 140  --   K 3.0* 3.1*  --   CL 98 102  --   CO2 28  --   --   GLUCOSE 119* 122*  --   BUN 15 16  --   CREATININE 1.30 1.30 1.24  CALCIUM 9.8  --   --    Liver Function Tests:  Recent Labs Lab 06/07/13 1731  AST 26  ALT 33  ALKPHOS 87  BILITOT 0.5  PROT 7.9  ALBUMIN 3.8   CBC:   Recent Labs Lab 06/07/13 1731 06/07/13 1737 06/08/13 0040  WBC 3.7*  --  3.9*  NEUTROABS 1.3*  --   --   HGB 13.3 14.6 12.6*  HCT 40.6 43.0 36.5*  MCV 71.0*  --  69.9*  PLT 211  --  196   Coagulation:  Recent Labs Lab 06/07/13 1731  LABPROT 13.6  INR 1.06   Cardiac Enzymes:  Recent Labs Lab 06/07/13 1731  TROPONINI <0.30   Urinalysis:  Recent Labs Lab 06/07/13 1844  COLORURINE YELLOW  LABSPEC 1.022  PHURINE 6.0  GLUCOSEU NEGATIVE  HGBUR NEGATIVE  BILIRUBINUR NEGATIVE  KETONESUR NEGATIVE  PROTEINUR NEGATIVE  UROBILINOGEN 0.2  NITRITE NEGATIVE  LEUKOCYTESUR NEGATIVE   Lipid Panel    Component Value Date/Time   CHOL 260* 06/08/2013 0040   TRIG 105 06/08/2013 0040   HDL 52 06/08/2013 0040   CHOLHDL 5.0 06/08/2013 0040   VLDL 21 06/08/2013 0040   LDLCALC 187* 06/08/2013 0040   HgbA1C  No results found for this basename: HGBA1C    Urine Drug Screen:  Component Value Date/Time   LABOPIA NONE DETECTED 06/07/2013 1844   COCAINSCRNUR NONE DETECTED 06/07/2013 1844   LABBENZ NONE DETECTED 06/07/2013 1844   AMPHETMU NONE DETECTED 06/07/2013 1844   THCU NONE DETECTED 06/07/2013 1844   LABBARB NONE DETECTED 06/07/2013 1844    Alcohol Level:  Recent Labs Lab 06/07/13 1731  ETH <11    Ct Head Wo Contrast 06/07/2013 1.  No acute intracranial process.  2.  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   MRI of the brain  canceled  MRA of the brain  canceled  2D Echocardiogram  pending  Carotid Doppler  canceled  CXR    EKG  sinus rhythm rate 77 beats per minute  Therapy Recommendations pending  Physical Exam   General: in bed, NAD  CV: Regular rate and rhythm  Mental Status:  Patient is awake, alert, oriented to person, place, month, year, and situation.  Immediate and remote memory are intact.  Patient is able to give a clear and coherent history.  No signs of aphasia or neglect -   question mild dysarthria Cranial Nerves:  II: Visual Fields are full. Pupils are equal, round, and reactive to light. Discs are difficult to visualize.  III,IV, VI: EOMI without ptosis or diploplia.  V: Facial sensation is symmetric to temperature  VII: Facial movement is notable for mild left droop.  VIII: hearing is intact to voice  X: Uvula elevates symmetrically  XI: Shoulder shrug is symmetric.  XII: tongue is midline without atrophy or fasciculations.  Motor:  Tone is spastic in his left upper extremity. Bulk is normal. He has 4/5 strength in his left arm, but significant spasticity and weakness of left grip. Likewise 4/5 strength in left leg. He has 4+/5 strength in his right arm with a mild pronator drift bilaterally, minimal weakness right leg .impaited coordination on left side. Sensory:  Sensation is diminished on left  Deep Tendon Reflexes:  Slightly increased in the left biceps and patella compared to the right  Cerebellar:  Impaired on left   Gait:  Not tested secondary to weakness   ASSESSMENT [name] is a 52 y.o. male presenting with new right hemiparesis. TPA was not given his symptoms were mild. CT of the head revealed a prior bilateral basal ganglia infarcts/hemorrhage with advanced microvascular ischemic disease. 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  On aspirin 81 mg orally every day prior to admission. Now on aspirin 325 mg orally every day for secondary stroke prevention. Patient with resultant right hemiparesis. Work up underway.   Hypokalemia  Hyperlipidemia - cholesterol 260 LDL 187 - history of elevated liver function tests on statin medications. Statin discontinued in 2006  Hypertension history  Previous intracranial hemorrhage in 1999  Hospital day # 1  TREATMENT/PLAN  Continue aspirin 325 mg orally every day for secondary stroke prevention.   Await therapists evaluations  Await 2-D echo and hemoglobin  A1c.  Delton See PA-C [facility] Pager [phone] 06/08/2013, 7:33 AM  I have personally obtained a history, examined the patient, evaluated imaging results, and formulated the assessment and plan of care. I agree with the above. Delia Heady, MD

## 2013-06-08 NOTE — H&P (Signed)
I saw and evaluated the patient. I reviewed the resident's note and confirmed the resident's findings.  I agree with the assessment and plan as documented in the resident's note.  [name] is a 52 yo man with a previous h/o right ICH with residual left sided weakness who presents with the new onset of RUE weakness and the inability to sit up.  He had been taking his ASA 81 mg PO QD and antihypertensives.  He was admitted to the Internal Medicine Teaching Service for further evaluation.  He is scheduled for an MRI, Echo, and carotid dopplers.  We will continue the ASA 325 mg PO QD and allow for some permissive HTN pending the work-up for CVA.  If found to have an ischemic CVA he will also be placed on statin therapy.  In the meantime, we will ask PT/OT to work with him.

## 2013-06-08 NOTE — Progress Notes (Signed)
Subjective: Feels like strength in RUE and RLE are improved, but still feels very off balance, especially in his core.  No dizziness.  Objective: Vital signs in last 24 hours: Filed Vitals:   06/07/13 2235 06/08/13 0217 06/08/13 0525 06/08/13 0950  BP: 137/85 115/70 129/95 134/93  Pulse: 76 66 77 76  Temp: 97.6 F (36.4 C) 97.9 F (36.6 C) 97.9 F (36.6 C) 98.1 F (36.7 C)  TempSrc: Oral Oral Oral Oral  Resp: 16 18 18 20   Height: 5\' 6"  (1.676 m)     Weight: 179 lb 12.8 oz (81.557 kg)     SpO2: 97% 99% 98% 100%   Weight change:   Intake/Output Summary (Last 24 hours) at 06/08/13 1033 Last data filed at 06/08/13 0920  Gross per 24 hour  Intake    120 ml  Output      0 ml  Net    120 ml   General: resting in bed, no acute distress Cardiac: RRR, no rubs, murmurs or gallops Pulm: clear to auscultation bilaterally, moving normal volumes of air Abd: soft, nontender, nondistended, BS present Ext: warm and well perfused, no pedal edema Neuro: alert and oriented X3, cranial nerves II-XII grossly intact, strength 5/5 in b/l UE & LE with sensation grossly intact (except for V1 & V2 on left side - decreased sensation), he does have some coordination difficulties with minimal past pointing on b/l finger-to-nose, and abnormal heel to shin;  He is able to sit himself up, but is off balance and cannot sit up for long  Lab Results: Basic Metabolic Panel:  Recent Labs Lab 06/07/13 1731 06/07/13 1737 06/08/13 0040  NA 138 140  --   K 3.0* 3.1*  --   CL 98 102  --   CO2 28  --   --   GLUCOSE 119* 122*  --   BUN 15 16  --   CREATININE 1.30 1.30 1.24  CALCIUM 9.8  --   --    Liver Function Tests:  Recent Labs Lab 06/07/13 1731  AST 26  ALT 33  ALKPHOS 87  BILITOT 0.5  PROT 7.9  ALBUMIN 3.8   CBC:  Recent Labs Lab 06/07/13 1731 06/07/13 1737 06/08/13 0040  WBC 3.7*  --  3.9*  NEUTROABS 1.3*  --   --   HGB 13.3 14.6 12.6*  HCT 40.6 43.0 36.5*  MCV 71.0*  --   69.9*  PLT 211  --  196   Cardiac Enzymes:  Recent Labs Lab 06/07/13 1731  TROPONINI <0.30   CBG:  Recent Labs Lab 06/07/13 1740  GLUCAP 109*   Fasting Lipid Panel:  Recent Labs Lab 06/08/13 0040  CHOL 260*  HDL 52  LDLCALC 187*  TRIG 105  CHOLHDL 5.0   Coagulation:  Recent Labs Lab 06/07/13 1731  LABPROT 13.6  INR 1.06   Urine Drug Screen: Drugs of Abuse     Component Value Date/Time   LABOPIA NONE DETECTED 06/07/2013 1844   COCAINSCRNUR NONE DETECTED 06/07/2013 1844   LABBENZ NONE DETECTED 06/07/2013 1844   AMPHETMU NONE DETECTED 06/07/2013 1844   THCU NONE DETECTED 06/07/2013 1844   LABBARB NONE DETECTED 06/07/2013 1844    Alcohol Level:  Recent Labs Lab 06/07/13 1731  ETH <11   Urinalysis:  Recent Labs Lab 06/07/13 1844  COLORURINE YELLOW  LABSPEC 1.022  PHURINE 6.0  GLUCOSEU NEGATIVE  HGBUR NEGATIVE  BILIRUBINUR NEGATIVE  KETONESUR NEGATIVE  PROTEINUR NEGATIVE  UROBILINOGEN 0.2  NITRITE  NEGATIVE  LEUKOCYTESUR NEGATIVE   Studies/Results: Ct Head Wo Contrast  06/07/2013   *RADIOLOGY REPORT*  Clinical Data: Right-sided weakness and confusion, history of CVA  CT HEAD WITHOUT CONTRAST  Technique:  Contiguous axial images were obtained from the base of the skull through the vertex without contrast.  Comparison: 01/09/2007; 01/07/2007; brain MRI - 01/08/2007  Findings:  Redemonstrated encephalomalacia within the right basal ganglia with associated asymmetric right-sided centralized volume loss and ex vacuo dilatation of the right lateral ventricle.  Interval resolution of previously noted left basal ganglial intracranial hemorrhage with residual encephalomalacia at this location.  There is persistent rather extensive periventricular hypodensity suggestive of microvascular ischemic disease.  Given the extensive background parenchymal abnormalities, there is no CT evidence of acute large territory infarct.  No intraparenchymal, extra-axial mass or  hemorrhage.  There is unchanged chronic minimal left to right midline shift measuring approximately 3 mm in diameter. Limited visualization of the paranasal sinuses and mastoid air cells are normal.  Regional soft tissues are normal.  No displaced calvarial fracture.  IMPRESSION: 1.  No acute intracranial process.  2.  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  Above findings discussed with Dr. Fredderick Phenix at [phone].   Original Report Authenticated By: Tacey Ruiz, MD   Medications: I have reviewed the patient's current medications. Scheduled Meds: . aspirin EC  325 mg Oral Daily  . heparin  5,000 Units Subcutaneous Q8H  . multivitamin with minerals  1 tablet Oral Daily  . potassium chloride  40 mEq Oral BID   Continuous Infusions:  PRN Meds:.senna-docusate  Assessment/Plan: Mr. Garcon is a 52 yo M with history of CVA with residual left sided coordination deficits (but good strength) who was admitted to [facility] overnight on 06/07/13.    #Right sided weakness: resolved but with balance deficits & with history of b/l prior CVA thought to be associated with cocaine induced hypertensive emergency; no events on telemetry & EKG sinus without arrythmia; likely secondary to HTN; of note, solumedrol was not given in ED (this was a past intra-articular injection) -MRI pending (just given ativan 0.5mg  for anxiety, patient going down now) -ASA 325 daily, no statin d/t history of LFT elevation on statin in 2006 (LDL 187) -Continue to hold antihypertensive medications -Echo & carotid dopplers pending (both ordered, not done yet) -A1c pending -PT/OT -Continue to monitor on tele  #Hypokalemia: likely related to diuretics; holding meds for now -Kdur 40 x 2, monitor AM labs  #HTN: home regimen includes amlodipine 10, HCTZ 25, atenolol 50, lisinopril 40 -Hold above regimen for now  #CKD 2: Cr stable at baseline ~1.3; likely related to h/o  HTN though UA neg for protein but no microalb/cr on file -avoid nephrotoxic drugs -AM BMET  #VTE ppx: Heparin TID  Dispo: Disposition is deferred at this time, awaiting improvement of current medical problems.  Anticipated discharge in approximately 2-3 day(s).   The patient does have a current PCP Lorretta Harp, MD) and does need an [facility] hospital follow-up appointment after discharge.  The patient does not have transportation limitations that hinder transportation to clinic appointments.  .Services Needed at time of discharge: Y = Yes, Blank = No PT:   OT:   RN:   Equipment:   Other:     LOS: 1 day   Belia Heman, MD 06/08/2013, 10:33 AM

## 2013-06-09 LAB — BASIC METABOLIC PANEL
CO2: 27 mEq/L (ref 19–32)
Chloride: 103 mEq/L (ref 96–112)
Glucose, Bld: 103 mg/dL — ABNORMAL HIGH (ref 70–99)
Sodium: 137 mEq/L (ref 135–145)

## 2013-06-09 MED ORDER — SALINE SPRAY 0.65 % NA SOLN
1.0000 | NASAL | Status: DC | PRN
  Filled 2013-06-09: qty 44

## 2013-06-09 NOTE — Evaluation (Signed)
Occupational Therapy Evaluation Patient Details Name: [name] MRN: [id number] DOB: Mar 04, 1961 Today's Date: 06/09/2013 Time: 3086-5784 OT Time Calculation (min): 59 min  OT Assessment / Plan / Recommendation History of present illness Pt with a history of htn, hyperlipidemia, Previous ICH (resulting in left side weakness) who was in his normal state of health until he laid down for a nap at 1:30pm on 06/07/2013 He stated that for the past few weeks he has felt generally weak, but when he awoke from his nap he had new right sided weakness which he had not had before. He typically compensates for his left sided weakness with his right, but found himself unable to do so on awakening form his nap. MRI shows 1.5 cm region of acute infarction affecting the white matter adjacent to the posterior body of the left lateral ventricle, with extension into the external capsule region.       Clinical Impression   Pt admitted with above.  Pt presents with poor motor control and significant balance deficits along with rest of below problem list.  Will continue to follow acutely in order to address below problem list. Recommending CIR to further progress rehab before eventual return home     OT Assessment  Patient needs continued OT Services    Follow Up Recommendations  CIR    Barriers to Discharge      Equipment Recommendations  3 in 1 bedside comode;Tub/shower bench    Recommendations for Other Services Rehab consult  Frequency  Min 3X/week    Precautions / Restrictions Precautions Precautions: Fall   Pertinent Vitals/Pain See vitals    ADL  Upper Body Bathing: Simulated;Minimal assistance Where Assessed - Upper Body Bathing: Supported sitting Lower Body Bathing: Simulated;Maximal assistance Where Assessed - Lower Body Bathing: Supported sit to stand Upper Body Dressing: Performed;Moderate assistance Where Assessed - Upper Body Dressing: Supported sitting Lower Body Dressing:  Performed;Maximal assistance Where Assessed - Lower Body Dressing: Supported sit to stand Toilet Transfer: Performed;+2 Total assistance Toilet Transfer: Patient Percentage: 50% Statistician Method: Surveyor, minerals: Materials engineer and Hygiene: Performed;+2 Total assistance Toileting - Architect and Hygiene: Patient Percentage: 30% Where Assessed - Engineer, mining and Hygiene: Sit to stand from 3-in-1 or toilet Equipment Used: Gait belt;Rolling walker Transfers/Ambulation Related to ADLs: +2 total assist for balance due to heavy right side lean and motor control deficits in LEs.   ADL Comments: Pt transferred from bed<>3n1<>chair.  Then ambulated ~10 ft with PT/OT +2 assist and RW.  Decreased functional use of bil UEs due to baseline LUE weakness and newly ataxic RUE.     OT Diagnosis: Generalized weakness;Paresis;Ataxia;Cognitive deficits  OT Problem List: Decreased strength;Impaired balance (sitting and/or standing);Decreased coordination;Decreased safety awareness;Decreased knowledge of use of DME or AE;Impaired sensation;Impaired UE functional use OT Treatment Interventions: Self-care/ADL training;Neuromuscular education;DME and/or AE instruction;Therapeutic activities;Cognitive remediation/compensation;Patient/family education;Balance training   OT Goals(Current goals can be found in the care plan section) Acute Rehab OT Goals Patient Stated Goal: to return to being independent OT Goal Formulation: With patient Time For Goal Achievement: 06/23/13 Potential to Achieve Goals: Good  Visit Information  Last OT Received On: 06/09/13 Assistance Needed: +2 PT/OT Co-Evaluation/Treatment: Yes History of Present Illness: Pt with a history of htn, hyperlipidemia, Previous ICH who was in his normal state of health until he laid down for a nap at 1:30pm on 06/07/2013 He stated that for the past few weeks he has  felt generally weak, but  when he awoke from his nap he had new right sided weakness which he had not had before. He typically compensates for his left sided weakness with his right, but found himself unable to do so on awakening form his nap. MRI shows 1.5 cm region of acute infarction affecting the white matter           Prior Functioning     Home Living Family/patient expects to be discharged to:: Private residence Living Arrangements: Spouse/significant other Available Help at Discharge: Family;Available PRN/intermittently Type of Home: House Home Access: Stairs to enter Entrance Stairs-Rails: Right Home Layout: One level Home Equipment: Gilmer Mor - single point  Lives With: Spouse Prior Function Level of Independence: Independent Communication Communication: No difficulties Dominant Hand: Right         Vision/Perception Perception Perception: Impaired Spatial Orientation: Decreased spatial awareness when sitting EOB and EOC, Praxis Praxis: Impaired Praxis Impairment Details: Ideomotor;Motor planning Praxis-Other Comments: RUE deficits   Cognition  Cognition Arousal/Alertness: Awake/alert Behavior During Therapy: Impulsive Overall Cognitive Status: Impaired/Different from baseline Area of Impairment: Safety/judgement Safety/Judgement: Decreased awareness of deficits General Comments: Pt somewhat impulsive during mobility and not fully aware of impact his deficits have on mobility.    Extremity/Trunk Assessment Upper Extremity Assessment Upper Extremity Assessment: RUE deficits/detail;LUE deficits/detail RUE Deficits / Details: Motor control deficits   RUE Sensation: decreased light touch;decreased proprioception RUE Coordination: decreased fine motor;decreased gross motor LUE Deficits / Details: 4/5 throughout. Tends to hold LUE in flexor position. Baseline from old ICH. LUE Sensation: decreased light touch;decreased proprioception LUE Coordination: decreased fine  motor;decreased gross motor     Mobility Bed Mobility Bed Mobility: Supine to Sit;Sitting - Scoot to Edge of Bed Supine to Sit: 3: Mod assist;With rails;HOB elevated Sitting - Scoot to Edge of Bed: 3: Mod assist Details for Bed Mobility Assistance: Assist for balance and to support trunk anteriorly.  Transfers Transfers: Sit to Stand;Stand to Sit Sit to Stand: 1: +2 Total assist;From chair/3-in-1;From bed;With upper extremity assist;With armrests Sit to Stand: Patient Percentage: 50% Stand to Sit: 1: +2 Total assist;To chair/3-in-1;With armrests;With upper extremity assist Stand to Sit: Patient Percentage: 50% Details for Transfer Assistance: Assist for control of power up and for balance due to right side lean. Assist also to push down through RW due to pt tendency to pick RW up off floor during sit<>stand.  VCs for technique and safest hand placement.     Exercise     Balance Balance Balance Assessed: Yes Static Sitting Balance Static Sitting - Balance Support: Bilateral upper extremity supported;Feet supported Static Sitting - Level of Assistance: 4: Min assist Static Sitting - Comment/# of Minutes: Pt with right posterior lean sitting EOB.  VCs to lean anteriorly. Pt with poor control when  and then requires assist to prevent LOB anteriorly.  Static Standing Balance Static Standing - Balance Support: Bilateral upper extremity supported Static Standing - Level of Assistance: 1: +2 Total assist Static Standing - Comment/# of Minutes: Assist to maintain midline orientation and to prevent right side lean. VCs for spatial awareness.   End of Session OT - End of Session Equipment Utilized During Treatment: Gait belt;Rolling walker Activity Tolerance: Patient tolerated treatment well Patient left: in chair;with call [place]/phone within reach;with chair alarm set;with family/visitor present Nurse Communication: Mobility status  GO    06/09/2013 Cipriano Mile OTR/L Pager  [phone] Office [phone]  Cipriano Mile 06/09/2013, 3:33 PM

## 2013-06-09 NOTE — Progress Notes (Addendum)
Rehab Admissions Coordinator Note:  Patient was screened by [name] for appropriateness for an Inpatient Acute Rehab Consult.  At this time, we are recommending Inpatient Rehab consult which is ordered and pending for Monday.  [name] 06/09/2013, 3:45 PM  I can be reached at [phone].

## 2013-06-09 NOTE — Progress Notes (Signed)
Stroke Team Progress Note  HISTORY HPI: [name] is a 52 y.o. male with a history of htn, hyperlipidemia, Previous ICH who was in his normal state of health until he laid down for a nap at 1:30pm on 06/07/2013 He stated that for the past few weeks he has felt generally weak, but when he awoke from his nap he had new right sided weakness which he had not had before. He typically compensates for his left sided weakness with his right, but found himself unable to do so on awakening form his nap.  LKW: 1:30 pm 06/07/2013 tpa given: no mild new deficits  NIHSS: 7(mostly old, only 2 new points)   SUBJECTIVE The patient's wife is present today. They are concerned that he has not yet been evaluated by the therapists. The patient may be a candidate for inpatient rehabilitation; however, we will need to wait for the therapist's recommendations.  OBJECTIVE Most recent Vital Signs: Filed Vitals:   06/08/13 1807 06/08/13 2200 06/09/13 0200 06/09/13 0600  BP: 130/95 152/105 147/90 142/104  Pulse: 72 86 82 88  Temp: 98.5 F (36.9 C) 98.4 F (36.9 C) 98.2 F (36.8 C) 97.7 F (36.5 C)  TempSrc: Oral     Resp: 20 18 18 18   Height:      Weight:      SpO2: 96% 98% 100% 100%   CBG (last 3)   Recent Labs  06/07/13 1740  GLUCAP 109*    IV Fluid Intake:     MEDICATIONS  . aspirin EC  325 mg Oral Daily  . heparin  5,000 Units Subcutaneous Q8H  . multivitamin with minerals  1 tablet Oral Daily   PRN:  senna-docusate  Diet:  Cardiac thin liquids Activity: Up with assistance DVT Prophylaxis:  Subcutaneous heparin and SCDs  CLINICALLY SIGNIFICANT STUDIES Basic Metabolic Panel:   Recent Labs Lab 06/07/13 1731 06/07/13 1737 06/08/13 0040 06/09/13 0500  NA 138 140  --  137  K 3.0* 3.1*  --  3.6  CL 98 102  --  103  CO2 28  --   --  27  GLUCOSE 119* 122*  --  103*  BUN 15 16  --  14  CREATININE 1.30 1.30 1.24 1.18  CALCIUM 9.8  --   --  9.4   Liver Function Tests:   Recent  Labs Lab 06/07/13 1731  AST 26  ALT 33  ALKPHOS 87  BILITOT 0.5  PROT 7.9  ALBUMIN 3.8   CBC:   Recent Labs Lab 06/07/13 1731 06/07/13 1737 06/08/13 0040  WBC 3.7*  --  3.9*  NEUTROABS 1.3*  --   --   HGB 13.3 14.6 12.6*  HCT 40.6 43.0 36.5*  MCV 71.0*  --  69.9*  PLT 211  --  196   Coagulation:   Recent Labs Lab 06/07/13 1731  LABPROT 13.6  INR 1.06   Cardiac Enzymes:   Recent Labs Lab 06/07/13 1731  TROPONINI <0.30   Urinalysis:   Recent Labs Lab 06/07/13 1844  COLORURINE YELLOW  LABSPEC 1.022  PHURINE 6.0  GLUCOSEU NEGATIVE  HGBUR NEGATIVE  BILIRUBINUR NEGATIVE  KETONESUR NEGATIVE  PROTEINUR NEGATIVE  UROBILINOGEN 0.2  NITRITE NEGATIVE  LEUKOCYTESUR NEGATIVE   Lipid Panel    Component Value Date/Time   CHOL 260* 06/08/2013 0040   TRIG 105 06/08/2013 0040   HDL 52 06/08/2013 0040   CHOLHDL 5.0 06/08/2013 0040   VLDL 21 06/08/2013 0040   LDLCALC 187* 06/08/2013 0040  HgbA1C  Lab Results  Component Value Date   HGBA1C 5.8* 06/08/2013    Urine Drug Screen:     Component Value Date/Time   LABOPIA NONE DETECTED 06/07/2013 1844   COCAINSCRNUR NONE DETECTED 06/07/2013 1844   LABBENZ NONE DETECTED 06/07/2013 1844   AMPHETMU NONE DETECTED 06/07/2013 1844   THCU NONE DETECTED 06/07/2013 1844   LABBARB NONE DETECTED 06/07/2013 1844    Alcohol Level:   Recent Labs Lab 06/07/13 1731  ETH <11    Ct Head Wo Contrast 06/07/2013 1.  No acute intracranial process.  2.  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   MRI of the brain   06/08/2013 1.5 cm region of acute infarction affecting the white matter  adjacent to the posterior body of the left lateral ventricle, with  extension into the external capsule region. No evidence of acute  hemorrhage or mass effect.  Extensive old ischemic changes elsewhere throughout the brain as  outlined above.   MRA of the brain    2D  Echocardiogram  pending  Carotid Doppler  canceled  CXR    EKG  sinus rhythm rate 77 beats per minute  Therapy Recommendations pending  Physical Exam   General - pleasant 52 year old male in bed in no acute distress. Heart - Regular rate and rhythm - no murmer Lungs - Clear to auscultation anteriorly Abdomen - Soft - non tender Extremities - Distal pulses intact - no edema Skin - Warm and dry   Mental Status:  Patient is awake, alert, oriented to person, place, month, year, and situation.  Immediate and remote memory are intact.  Patient is able to give a clear and coherent history.  No signs of aphasia or neglect -  question mild dysarthria Cranial Nerves:  II: Visual Fields are full. Pupils are equal, round, and reactive to light. Discs are difficult to visualize.  III,IV, VI: EOMI without ptosis or diploplia.  V: Facial sensation is symmetric to temperature  VII: Facial movement is notable for mild left droop.  VIII: hearing is intact to voice  X: Uvula elevates symmetrically  XI: Shoulder shrug is symmetric.  XII: tongue is midline without atrophy or fasciculations.  Motor:  Tone is spastic in his left upper extremity. Bulk is normal. He has 4/5 strength in his left arm, but significant spasticity and weakness of left grip. Likewise 4/5 strength in left leg. He has 4+/5 strength in his right arm with a mild pronator drift bilaterally, minimal weakness right leg .impaired coordination on left side.diminished fine finger movements on right. Sensory:  Sensation is diminished on left  Deep Tendon Reflexes:  Slightly increased in the left biceps and patella compared to the right  Cerebellar:  Impaired on left   Gait:  Not tested secondary to weakness   ASSESSMENT [name] is a 52 y.o. male presenting with new right hemiparesis. TPA was not given as his symptoms were mild. MRI of the head 06/08/2013 reveals a 1.5 cm region of acute infarction affecting the white  matter adjacent to the posterior body of the left lateral ventricle, with extension into the external capsule region. 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  On aspirin 81 mg orally every day prior to admission. Now on aspirin 325 mg orally every day for secondary stroke prevention. Patient with resultant right hemiparesis. Work up underway.   Hypokalemia - resolved  Hyperlipidemia - cholesterol 260 LDL 187 - history of  elevated liver function tests on statin medications. Statin discontinued in 2006  Hypertension history  Previous intracranial hemorrhage in 1999  Hospital day # 2  TREATMENT/PLAN  Continue aspirin 325 mg orally every day for secondary stroke prevention.  Await therapists evaluations. Likely need inpatient rehab.  Await 2-D echo and hemoglobin A1c.   Delton See PA-C [facility] Pager [phone] 06/09/2013, 8:03 AM  I have personally obtained a history, examined the patient, evaluated imaging results, and formulated the assessment and plan of care. I agree with the above.  Delia Heady, MD

## 2013-06-09 NOTE — Progress Notes (Signed)
VASCULAR LAB PRELIMINARY  PRELIMINARY  PRELIMINARY  PRELIMINARY  Carotid duplex completed.    Preliminary report:  Bilateral:  Less than 40% ICA stenosis.  Vertebral artery flow is antegrade.     Ardyn Forge, RVS 06/09/2013, 11:31 AM

## 2013-06-09 NOTE — Evaluation (Signed)
Physical Therapy Evaluation Patient Details Name: [name] FULL MRN: [id number] DOB: 1960-11-29 Today's Date: 06/09/2013 Time: 7829-5621 PT Time Calculation (min): 49 min  PT Assessment / Plan / Recommendation History of Present Illness  Pt with a history of htn, hyperlipidemia, Previous ICH who was in his normal state of health until he laid down for a nap at 1:30pm on 06/07/2013 He stated that for the past few weeks he has felt generally weak, but when he awoke from his nap he had new right sided weakness which he had not had before. He typically compensates for his left sided weakness with his right, but found himself unable to do so on awakening form his nap. MRI shows 1.5 cm region of acute infarction affecting the white matter      Clinical Impression  Pt admitted with CVA. Pt currently with functional limitations due to the deficits listed below (see PT Problem List).  Pt will benefit from skilled PT to increase their independence and safety with mobility to allow discharge to the venue listed below.       PT Assessment  Patient needs continued PT services    Follow Up Recommendations  CIR    Does the patient have the potential to tolerate intense rehabilitation      Barriers to Discharge        Equipment Recommendations  Other (comment) (to be determined)    Recommendations for Other Services Rehab consult   Frequency Min 4X/week    Precautions / Restrictions Precautions Precautions: Fall   Pertinent Vitals/Pain no apparent distress       Mobility  Bed Mobility Bed Mobility: Supine to Sit;Sitting - Scoot to Edge of Bed Supine to Sit: 3: Mod assist;With rails;HOB elevated Sitting - Scoot to Edge of Bed: 3: Mod assist Details for Bed Mobility Assistance: Assist for balance and to support trunk anteriorly.  Transfers Transfers: Sit to Stand;Stand to Sit;Stand Pivot Transfers Sit to Stand: 1: +2 Total assist;From chair/3-in-1;From bed;With upper extremity  assist;With armrests Sit to Stand: Patient Percentage: 50% Stand to Sit: 1: +2 Total assist;To chair/3-in-1;With armrests;With upper extremity assist Stand to Sit: Patient Percentage: 50% Stand Pivot Transfers: 1: +2 Total assist Stand Pivot Transfers: Patient Percentage: 50% Details for Transfer Assistance: Assist for control of power up and for balance due to right side lean. Assist also to push down through RW due to pt tendency to pick RW up off floor during sit<>stand.  VCs for technique and safest hand placement. Ambulation/Gait Ambulation/Gait Assistance: 1: +2 Total assist Ambulation/Gait: Patient Percentage: 50% Ambulation Distance (Feet): 8 Feet Assistive device: Rolling walker;2 person hand held assist Ambulation/Gait Assistance Details: Cues for upright posture, and frequent cues to stop and correct R lean; Pt was able to correct and find midline with cues, but would sink back into R lean within 20 seconds, and especially with effort; Noted tending to use flexion synergy to advance LLE; decr stability in Right stance Gait Pattern: Decreased weight shift to left;Ataxic Modified Rankin (Stroke Patients Only) Pre-Morbid Rankin Score: No significant disability Modified Rankin: Severe disability    Exercises     PT Diagnosis: Difficulty walking;Abnormality of gait;Hemiplegia dominant side;Hemiplegia non-dominant side  PT Problem List: Decreased activity tolerance;Decreased balance;Decreased mobility;Decreased coordination;Decreased cognition;Decreased knowledge of use of DME;Decreased safety awareness;Decreased knowledge of precautions;Impaired tone PT Treatment Interventions: DME instruction;Gait training;Stair training;Functional mobility training;Therapeutic activities;Therapeutic exercise;Balance training;Neuromuscular re-education;Cognitive remediation;Patient/family education     PT Goals(Current goals can be found in the care plan section) Acute Rehab PT  Goals Patient Stated  Goal: to return to being independent PT Goal Formulation: With patient Time For Goal Achievement: 06/23/13 Potential to Achieve Goals: Good  Visit Information  Last PT Received On: 06/09/13 Assistance Needed: +2 PT/OT Co-Evaluation/Treatment: Yes History of Present Illness: Pt with a history of htn, hyperlipidemia, Previous ICH who was in his normal state of health until he laid down for a nap at 1:30pm on 06/07/2013 He stated that for the past few weeks he has felt generally weak, but when he awoke from his nap he had new right sided weakness which he had not had before. He typically compensates for his left sided weakness with his right, but found himself unable to do so on awakening form his nap. MRI shows 1.5 cm region of acute infarction affecting the white matter           Prior Functioning  Home Living Family/patient expects to be discharged to:: Private residence Living Arrangements: Spouse/significant other Available Help at Discharge: Family;Available PRN/intermittently Type of Home: House Home Access: Stairs to enter Entrance Stairs-Rails: Right Home Layout: One level Home Equipment: Gilmer Mor - single point  Lives With: Spouse Prior Function Level of Independence: Independent Communication Communication: No difficulties Dominant Hand: Right    Cognition  Cognition Arousal/Alertness: Awake/alert Behavior During Therapy: Impulsive Overall Cognitive Status: Impaired/Different from baseline Area of Impairment: Safety/judgement Safety/Judgement: Decreased awareness of deficits General Comments: Pt somewhat impulsive during mobility and not fully aware of impact his deficits have on mobility.    Extremity/Trunk Assessment Upper Extremity Assessment Upper Extremity Assessment: RUE deficits/detail;LUE deficits/detail RUE Deficits / Details: Motor control deficits  RUE Sensation: decreased light touch;decreased proprioception RUE Coordination: decreased fine motor;decreased  gross motor LUE Deficits / Details: 4/5 throughout. Tends to hold LUE in flexor position. Baseline from old ICH. LUE Sensation: decreased light touch;decreased proprioception LUE Coordination: decreased fine motor;decreased gross motor Lower Extremity Assessment Lower Extremity Assessment: RLE deficits/detail;LLE deficits/detail RLE Deficits / Details: 4/5 throughout RLE Coordination: decreased gross motor LLE Deficits / Details: defecits/inefficient movement patterns from last stroke resembling flexion synergy LLE Coordination: decreased gross motor   Balance Balance Balance Assessed: Yes Static Sitting Balance Static Sitting - Balance Support: Bilateral upper extremity supported;Feet supported Static Sitting - Level of Assistance: 4: Min assist Static Sitting - Comment/# of Minutes: Pt with right posterior pelvic tilt sitting EOB.  VCs to lean anteriorly. Pt with poor control of forward leaning and then requires assist to prevent LOB anteriorly.  Static Standing Balance Static Standing - Balance Support: Bilateral upper extremity supported Static Standing - Level of Assistance: 1: +2 Total assist Static Standing - Comment/# of Minutes: Assist to maintain midline orientation and to prevent right side lean. VCs for spatial awareness.  End of Session PT - End of Session Equipment Utilized During Treatment: Gait belt Activity Tolerance: Patient tolerated treatment well;Other (comment) (Disappointed in difficulty with amb) Patient left: in chair;with chair alarm set;with family/visitor present;with call [place]/phone within reach Nurse Communication: Mobility status  GP     Van Clines [facility] [place],  [phone]  06/09/2013, 6:06 PM

## 2013-06-09 NOTE — Progress Notes (Signed)
Internal Medicine Attending  Date: 06/09/2013  Patient name: [name] Medical record number: [id number] Date of birth: [birth year] Age: 52 y.o. Gender: male  I saw and evaluated the patient. I reviewed the resident's note by Dr. Mariea Clonts and I agree with the resident's findings and plans as documented in her progress note.  MRI yesterday revealed a 1.5 cm region of acute infarction affecting the white matter adjacent to the posterior body of the left lateral ventricle, with extension into the external capsule region. There was no evidence of acute hemorrhage or mass effect. He has been evaluated by PT/OT and their recommendation is inpatient rehab.  They will formally consult in the morning.

## 2013-06-09 NOTE — Evaluation (Signed)
Speech Language Pathology Evaluation Patient Details Name: [name] MRN: [id number] DOB: 1960-12-02 Today's Date: 06/09/2013 Time: 4098-1191 SLP Time Calculation (min): 31 min  Problem List:  Patient Active Problem List   Diagnosis Date Noted  . CVA (cerebral infarction) 06/08/2013  . Special screening for malignant neoplasms, colon 04/05/2013  . Tick bite 04/01/2013  . Microcytosis 01/08/2013  . Screening for viral disease 01/04/2013  . Plantar fasciitis 11/20/2012  . Shoulder pain 04/10/2012  . Osteoarthritis 03/27/2012  . BPH (benign prostatic hyperplasia) 06/06/2011  . Preventative health care 06/06/2011  . CVA (cerebral vascular accident) 05/06/2011  . TORTICOLLIS, SPASMODIC 04/27/2007  . ERECTILE DYSFUNCTION 09/19/2006  . DRUG ABUSE, HX OF 09/19/2006  . HYPERLIPIDEMIA 09/18/2006  . HYPERTENSION 09/18/2006   Past Medical History:  Past Medical History  Diagnosis Date  . Hyperlipidemia     elevated transaminases on statin?-40's (2/06) - Hep B/C negative 4/06; resolution on 4/06 labs; off statin in 2006  . Hypertension     concentric LVH 2d echo 12/05; negative proteinuria 11/00  . 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 Diastolic dysfunction     impaired L ventricle relatation with an EF of 65% and echo in December 2005  . Onychomycosis April 2003    right big toe  . Chest pain     with a negative adenosine cardiolite in December 2006. Dr. Robet Leu   . Tinea versicolor     dermatology referral; secondary to no treatement response to selenium sulfide; currently resolive  . Prostate  disorder     prostate irregularity with obstructive voiding symptoms (PSA 0.67); -likely related to overstimulation of alpha receptors at bladder neck; seen by Dr. Wanda Plump in 9/05. no BPH at that visit  . Erectile dysfunction   . Arthritis    Past Surgical History:  Past Surgical History  Procedure Laterality Date  . Colonoscopy with propofol N/A 05/22/2013    Procedure: COLONOSCOPY WITH PROPOFOL;  Surgeon: Willis Modena, MD;  Location: [facility];  Service: Endoscopy;  Laterality: N/A;   HPI:  [name] is a 52 y.o. male with a history of htn, hyperlipidemia, Previous ICH 1999 who was in his normal state of health until he laid down for a nap at 1:30pm on 06/07/2013 He stated that for the past few weeks he has felt generally weak, but when he awoke from his nap he had new right sided weakness which he had not had before. He typically compensates for his left sided weakness with his right, but found himself unable to do so on awakening from his nap.  Pt MRI showed Diffusion imaging shows a 1.5 cm region of acute infarct in deep white matter.     Assessment / Plan / Recommendation Clinical Impression  Pt presents with mild dysarthria with imprecise articulation and rapid rate of speech. Both pt and his wife state speech, cognition, and language skills are baseline.  Pt able to follow complex directions and carry on high level conversation.  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  Mild drooling  on left noted, which wife and pt state is baseline when pt becomes frustrated or tired.  Pt encouraged to use call [facility] for assistance but he states it is not fair for him to have to wait, behavioral note problem solving.  SLP educated pt and spouse to importance of quick transfer to hospital if have stroke symptoms and not mobilizing without assist due to fall risk.  No further SLP indicated as pt is at baseline function.      SLP  Assessment  Patient does not need any further Speech Lanaguage Pathology Services    Follow Up Recommendations  None    Frequency and Duration   n/a     Pertinent Vitals/Pain Afebrile, decreased   SLP Goals     SLP Evaluation Prior Functioning  Cognitive/Linguistic Baseline: Within functional limits  Lives With: Spouse Available Help at Discharge:  (wife works for [employer] ) Education: college degree in Designer, fashion/clothing received after last cva  Vocation: On disability   Cognition  Overall Cognitive Status: Within Functional Limits for tasks assessed Arousal/Alertness: Awake/alert Orientation Level: Oriented X4 Attention: Sustained Sustained Attention: Appears intact Memory: Appears intact Awareness: Appears intact (pt desires to get up on his own without calling, personality) Behaviors: Restless Safety/Judgment: Appears intact (pt desires to get up on his own without calling, personality)    Comprehension  Auditory Comprehension Overall Auditory Comprehension: Appears within functional limits for tasks assessed Commands: Within Functional Limits Multistep Basic Commands: 75-100% accurate Complex Commands: 75-100% accurate Conversation: Complex Visual Recognition/Discrimination Discrimination: Not tested Reading Comprehension Reading Status: Not tested    Expression Expression Primary Mode of Expression: Verbal Verbal Expression Overall Verbal Expression: Appears within functional limits for tasks assessed Initiation: No impairment Level of Generative/Spontaneous Verbalization: Conversation Repetition: No impairment Naming: No impairment Pragmatics: No impairment Written Expression Dominant Hand: Right Written Expression: Not tested (wife manages bills at home)   Oral / Motor Oral Motor/Sensory Function Labial ROM: Reduced left Labial Symmetry: Abnormal symmetry left Labial Strength: Reduced Lingual ROM: Reduced left Lingual Symmetry: Abnormal  symmetry left Lingual Strength: Reduced Facial ROM: Reduced left Facial Strength: Reduced Velum: Within Functional Limits Mandible: Within Functional Limits Motor Speech Overall Motor Speech: Impaired at baseline Respiration: Within functional limits Phonation: Normal Resonance: Within functional limits Articulation: Impaired Level of Impairment: Conversation Intelligibility: Intelligibility reduced (but baseline) Word: 75-100% accurate Phrase: 75-100% accurate Sentence: 50-74% accurate Conversation: 50-74% accurate Motor Planning: Witnin functional limits Interfering Components: Premorbid status Effective Techniques: Slow rate   GO    Donavan Burnet, MS [facility] SLP [phone]

## 2013-06-09 NOTE — Progress Notes (Signed)
Subjective: No complaints this morning. Reports improving strength in his right upper extremity, both poor coordination. No dizziness chest pain or shortness of breath.  Objective: Vital signs in last 24 hours: Filed Vitals:   06/08/13 1807 06/08/13 2200 06/09/13 0200 06/09/13 0600  BP: 130/95 152/105 147/90 142/104  Pulse: 72 86 82 88  Temp: 98.5 F (36.9 C) 98.4 F (36.9 C) 98.2 F (36.8 C) 97.7 F (36.5 C)  TempSrc: Oral     Resp: 20 18 18 18   Height:      Weight:      SpO2: 96% 98% 100% 100%    Intake/Output Summary (Last 24 hours) at 06/09/13 1012 Last data filed at 06/09/13 0851  Gross per 24 hour  Intake    920 ml  Output    500 ml  Net    420 ml   General appearance: alert, cooperative, appears stated age and no distress Head: Normocephalic, without obvious abnormality, atraumatic Lungs: clear to auscultation bilaterally Heart: regular rate and rhythm, S1, S2 normal, no murmur, click, rub or gallop Abdomen: soft, non-tender; bowel sounds normal; no masses,  no organomegaly Extremities: extremities normal, atraumatic, no cyanosis or edema Neurologic: Alert and oriented X 3, normal strength and tone. Strength- 5/5 in RUE,RLE, LUE, and LLE. Abnormal finger to nose tests- RUE and LUE, but better coordination though slow on LUE. Rapid alternating movements abnormal in both upper extremities Lab Results: Basic Metabolic Panel:  Recent Labs Lab 06/07/13 1731 06/07/13 1737 06/08/13 0040 06/09/13 0500  NA 138 140  --  137  K 3.0* 3.1*  --  3.6  CL 98 102  --  103  CO2 28  --   --  27  GLUCOSE 119* 122*  --  103*  BUN 15 16  --  14  CREATININE 1.30 1.30 1.24 1.18  CALCIUM 9.8  --   --  9.4   Liver Function Tests:  Recent Labs Lab 06/07/13 1731  AST 26  ALT 33  ALKPHOS 87  BILITOT 0.5  PROT 7.9  ALBUMIN 3.8   CBC:  Recent Labs Lab 06/07/13 1731 06/07/13 1737 06/08/13 0040  WBC 3.7*  --  3.9*  NEUTROABS 1.3*  --   --   HGB 13.3 14.6 12.6*    HCT 40.6 43.0 36.5*  MCV 71.0*  --  69.9*  PLT 211  --  196   Cardiac Enzymes:  Recent Labs Lab 06/07/13 1731  TROPONINI <0.30   CBG:  Recent Labs Lab 06/07/13 1740  GLUCAP 109*   Hemoglobin A1C:  Recent Labs Lab 06/08/13 1133  HGBA1C 5.8*   Fasting Lipid Panel:  Recent Labs Lab 06/08/13 0040  CHOL 260*  HDL 52  LDLCALC 187*  TRIG 105  CHOLHDL 5.0   Coagulation:  Recent Labs Lab 06/07/13 1731  LABPROT 13.6  INR 1.06   Urine Drug Screen: Drugs of Abuse     Component Value Date/Time   LABOPIA NONE DETECTED 06/07/2013 1844   COCAINSCRNUR NONE DETECTED 06/07/2013 1844   LABBENZ NONE DETECTED 06/07/2013 1844   AMPHETMU NONE DETECTED 06/07/2013 1844   THCU NONE DETECTED 06/07/2013 1844   LABBARB NONE DETECTED 06/07/2013 1844    Alcohol Level:  Recent Labs Lab 06/07/13 1731  ETH <11   Urinalysis:  Recent Labs Lab 06/07/13 1844  COLORURINE YELLOW  LABSPEC 1.022  PHURINE 6.0  GLUCOSEU NEGATIVE  HGBUR NEGATIVE  BILIRUBINUR NEGATIVE  KETONESUR NEGATIVE  PROTEINUR NEGATIVE  UROBILINOGEN 0.2  NITRITE NEGATIVE  LEUKOCYTESUR NEGATIVE    Studies/Results: Ct Head Wo Contrast  06/07/2013   *RADIOLOGY REPORT*  Clinical Data: Right-sided weakness and confusion, history of CVA  CT HEAD WITHOUT CONTRAST  Technique:  Contiguous axial images were obtained from the base of the skull through the vertex without contrast.  Comparison: 01/09/2007; 01/07/2007; brain MRI - 01/08/2007  Findings:  Redemonstrated encephalomalacia within the right basal ganglia with associated asymmetric right-sided centralized volume loss and ex vacuo dilatation of the right lateral ventricle.  Interval resolution of previously noted left basal ganglial intracranial hemorrhage with residual encephalomalacia at this location.  There is persistent rather extensive periventricular hypodensity suggestive of microvascular ischemic disease.  Given the extensive background parenchymal  abnormalities, there is no CT evidence of acute large territory infarct.  No intraparenchymal, extra-axial mass or hemorrhage.  There is unchanged chronic minimal left to right midline shift measuring approximately 3 mm in diameter. Limited visualization of the paranasal sinuses and mastoid air cells are normal.  Regional soft tissues are normal.  No displaced calvarial fracture.  IMPRESSION: 1.  No acute intracranial process.  2.  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  Above findings discussed with Dr. Fredderick Phenix at [phone].   Original Report Authenticated By: Tacey Ruiz, MD   Mr Brain Wo Contrast  06/08/2013   *RADIOLOGY REPORT*  Clinical Data: Stroke.  Right-sided weakness.  Confusion.  MRI HEAD WITHOUT CONTRAST  Technique:  Multiplanar, multiecho pulse sequences of the brain and surrounding structures were obtained according to standard protocol without intravenous contrast.  Comparison: Head CT 06/07/2013.  MRI 01/08/2007.  Findings: Diffusion imaging shows a 1.5 cm region of acute infarction in the deep white matter adjacent to the posterior body of the left lateral ventricle.  No other acute infarction.  There is old infarction within the right side of the pons.  There are old infarctions in both basal ganglia regions.  There is extensive chronic small vessel disease throughout the hemispheric white matter.  No evidence of mass lesion, acute hemorrhage, hydrocephalus or extra-axial collection.  The old basal ganglia infarctions are associated with hemosiderin deposition.  No pituitary mass.  No inflammatory sinus disease.  No skull or skull base lesion.  IMPRESSION: 1.5 cm region of acute infarction affecting the white matter adjacent to the posterior body of the left lateral ventricle, with extension into the external capsule region.  No evidence of acute hemorrhage or mass effect.  Extensive old ischemic changes elsewhere throughout the  brain as outlined above.   Original Report Authenticated By: Paulina Fusi, M.D.   Medications: I have reviewed the patient's current medications. Scheduled Meds: . aspirin EC  325 mg Oral Daily  . heparin  5,000 Units Subcutaneous Q8H  . multivitamin with minerals  1 tablet Oral Daily   Continuous Infusions:  PRN Meds:.senna-docusate Assessment/Plan:  # Left CVA- patient complains of right upper extremity and right lower extremity weakness seemed to have resolved both with persistent coordination deficits. 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 MRI Wo contrast showed- 1.5 cm region of acute infarction affecting the white matter adjacent to the posterior body of the left lateral ventricle, with extension into the external capsule region.  No evidence of acute hemorrhage or mass effect.  Extensive old ischemic changes elsewhere. HBA1C -mildly elevated at 5.8. -PT/OT consult. -Continue to hold anti-HTN for now. - ASA 325mg  daily. -Senna  -Echo and carotid dopplers- pending. #Hyperlipidemia- LDL  cholesterol elevated at 187mg /dl, and total cholesterol of 260. Patient had LFT elevation on statin in 2006.  - Consider restarting a new statin at a lower dose.  #Hypertension- home regimen includes amlodipine 10, HCTZ 25, atenolol 50, lisinopril 40  -Hold above regimen for now  # #CKD 2: Cr today- 1.18. GFR- 70. Likely due to HTN, urine negative for proteins. -avoid nephrotoxic drugs  -AM BMET  #VTE ppx- Heparin 5000 TID.    Dispo: Disposition is deferred at this time, awaiting improvement of current medical problems.  Anticipated discharge in approximately 1-2 day(s).   The patient does have a current PCP Lorretta Harp, MD) and does need an [facility] hospital follow-up appointment after discharge.  The patient does not have transportation limitations that hinder transportation to clinic appointments.  .Services Needed at time  of discharge: Y = Yes, Blank = No PT:   OT:   RN:   Equipment:   Other:     LOS: 2 days   Kennis Carina, MD 06/09/2013, 10:12 AM

## 2013-06-10 DIAGNOSIS — I6330 Cerebral infarction due to thrombosis of unspecified cerebral artery: Secondary | ICD-10-CM

## 2013-06-10 MED ORDER — SIMVASTATIN 5 MG PO TABS
5.0000 mg | ORAL_TABLET | Freq: Every day | ORAL | Status: DC
  Administered 2013-06-10: 5 mg via ORAL
  Filled 2013-06-10 (×2): qty 1

## 2013-06-10 MED ORDER — ENOXAPARIN SODIUM 40 MG/0.4ML ~~LOC~~ SOLN
40.0000 mg | SUBCUTANEOUS | Status: DC
  Administered 2013-06-10: 40 mg via SUBCUTANEOUS
  Filled 2013-06-10 (×2): qty 0.4

## 2013-06-10 MED ORDER — AMLODIPINE BESYLATE 10 MG PO TABS
10.0000 mg | ORAL_TABLET | Freq: Every day | ORAL | Status: DC
  Administered 2013-06-10 – 2013-06-11 (×2): 10 mg via ORAL
  Filled 2013-06-10 (×2): qty 1

## 2013-06-10 NOTE — PMR Pre-admission (Signed)
PMR Admission Coordinator Pre-Admission Assessment  Patient: [name] is an 52 y.o., male MRN: [id number] DOB: May 28, 1961 Height: 5\' 6"  (167.6 cm) Weight: 81.557 kg (179 lb 12.8 oz)              Insurance Information HMO:      PPO:       PCP:       IPA:       80/20:       OTHER:  Group # [id number] PRIMARY: Hazeline Junker      Policy#: [id number]      Subscriber: Eunice Blase CM Name:  Tilford Pillar     Phone#: [phone]     Fax#: [phone] Pre-Cert#:                                  Employer: [employer] Benefits:  Phone #: [phone]     Name: Meyer Cory. Date: 11/14/12     Deduct: $0      Out of Pocket Max: $3900(met $28.48)      Life Max: unlimited CIR: $265 copay w/auth up to $1590 per admit      SNF: $0 days 1-7, $25 days 8-20, $135 days 21-100 Outpatient: w/medical necessity     Co-Pay: $40/day copay Home Health: 100% w/auth      Co-Pay: none DME: 80% w/auth     Co-Pay: 20% Providers: in network  Emergency Contact Information Contact Information   Name Relation Home Work Mobile   [place] Spouse   [phone]   Floyde, Dingley   [phone]     Current Medical History  Patient Admitting Diagnosis: L periventricular infarct   History of Present Illness:  A 52 y.o. 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 Patient states to be independent prior to admission living with his wife who works day shift. Admitted 06/08/2013 with right-sided weakness. MRI of the brain showed a 1.5 cm region of acute infarction affecting the white matter adjacent to posterior body of the left lateral ventricle with extension into the external capsule region as well as extensive ischemic changes throughout the brain. Carotid Dopplers with less than 40% ICA stenosis. Echocardiogram pending. Urine drug screen was negative. Patient did not receive TPA.  Neurology services consulted maintained on aspirin therapy for CVA prophylaxis as well as subcutaneous heparin for DVT prophylaxis. Tolerating a regular consistency diet. Physical and occupational therapy evaluations completed 06/09/2013 with recommendations of physical medicine rehabilitation consult to consider inpatient rehabilitation services.     Total: 6=NIH  Past Medical History  Past Medical History  Diagnosis Date  . Hyperlipidemia     elevated transaminases on statin?-40's (2/06) - Hep B/C negative 4/06; resolution on 4/06 labs; off statin in 2006  . Hypertension     concentric LVH 2d echo 12/05; negative proteinuria 11/00  . 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 Diastolic dysfunction     impaired L ventricle relatation with an EF  of 65% and echo in December 2005  . Onychomycosis April 2003    right big toe  . Chest pain     with a negative adenosine cardiolite in December 2006. Dr. Robet Leu   . Tinea versicolor     dermatology referral; secondary to no treatement response to selenium sulfide; currently resolive  . 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 Wanda Plump in 9/05. no BPH at that visit  . Erectile dysfunction   . Arthritis     Family History  family history is not on file.  Prior Rehab/Hospitalizations: Had CIR in 1999 after CVA, the outpatient therapy.  Had outpatient therapy 6 months ago on [facility]   Current Medications  Current  facility-administered medications:amLODipine (NORVASC) tablet 10 mg, 10 mg, Oral, Daily, Ejiroghene Emokpae, MD, 10 mg at 06/11/13 1013;  aspirin EC tablet 325 mg, 325 mg, Oral, Daily, Ritta Slot, MD, 325 mg at 06/11/13 1013;  atorvastatin (LIPITOR) tablet 10 mg, 10 mg, Oral, q1800, Belia Heman, MD;  enoxaparin (LOVENOX) injection 40 mg, 40 mg, Subcutaneous, Q24H, Ejiroghene Emokpae, MD, 40 mg at 06/10/13 2120 multivitamin with minerals tablet 1 tablet, 1 tablet, Oral, Daily, Jonah Blue, DO, 1 tablet at 06/11/13 1013;  senna-docusate (Senokot-S) tablet 1 tablet, 1 tablet, Oral, QHS PRN, Christen Bame, MD;  sodium chloride (OCEAN) 0.65 % nasal spray 1 spray, 1 spray, Each Nare, PRN, Boykin Peek, MD  Patients Current Diet: Cardiac  Precautions / Restrictions Precautions Precautions: Fall Restrictions Weight Bearing Restrictions: No   Prior Activity Level Community (5-7x/wk): Went out daily.  Home Assistive Devices / Equipment Home Assistive Devices/Equipment: Eyeglasses;Cane (specify quad or straight) Home Equipment: Cane - single point  Prior Functional Level Prior Function Level of Independence: Independent  Current Functional Level Cognition  Arousal/Alertness: Awake/alert Overall Cognitive Status: Within Functional Limits for tasks assessed Orientation Level: Oriented X4 Safety/Judgement: Decreased awareness of deficits General Comments: Pt somewhat impulsive during mobility and not fully aware of impact his deficits have on mobility. Attention: Sustained Sustained Attention: Appears intact Memory: Appears intact Awareness: Appears intact (pt desires to get up on his own without calling, personality) Behaviors: Restless Safety/Judgment: Appears intact (pt desires to get up on his own without calling, personality)    Extremity Assessment (includes Sensation/Coordination)  Upper Extremity Assessment: RUE deficits/detail;LUE deficits/detail RUE Deficits /  Details: Motor control deficits  RUE Sensation: decreased light touch;decreased proprioception RUE Coordination: decreased fine motor;decreased gross motor LUE Deficits / Details: 4/5 throughout. Tends to hold LUE in flexor position. Baseline from old ICH. LUE Sensation: decreased light touch;decreased proprioception LUE Coordination: decreased fine motor;decreased gross motor  Lower Extremity Assessment: RLE deficits/detail;LLE deficits/detail RLE Deficits / Details: 4/5 throughout RLE Coordination: decreased gross motor LLE Deficits / Details: defecits/inefficient movement patterns from last stroke resembling flexion synergy LLE Coordination: decreased gross motor    ADLs  Upper Body Bathing: Simulated;Minimal assistance Where Assessed - Upper Body Bathing: Supported sitting Lower Body Bathing: Simulated;Maximal assistance Where Assessed - Lower Body Bathing: Supported sit to stand Upper Body Dressing: Performed;Moderate assistance Where Assessed - Upper Body Dressing: Supported sitting Lower Body Dressing: Performed;Maximal assistance Where Assessed - Lower Body Dressing: Supported sit to stand Toilet Transfer: Teacher, early years/pre: Patient Percentage: 50% Statistician Method: Surveyor, minerals: Materials engineer and Hygiene: Performed;+2 Total assistance Toileting - Architect and Hygiene:  Patient Percentage: 30% Where Assessed - Toileting Clothing Manipulation and Hygiene: Sit to stand from 3-in-1 or toilet Equipment Used: Gait belt;Rolling walker Transfers/Ambulation Related to ADLs: +2 total assist for balance due to heavy right side lean and motor control deficits in LEs.   ADL Comments: Pt transferred from bed<>3n1<>chair.  Then ambulated ~10 ft with PT/OT +2 assist and RW.  Decreased functional use of bil UEs.     Mobility  Bed Mobility: Supine to Sit;Sitting - Scoot to Edge of  Bed Supine to Sit: 4: Min assist Sitting - Scoot to [place] of Bed: 4: Min guard    Transfers  Transfers: Sit to Stand;Stand to Sit Sit to Stand: 1: +2 Total assist;From chair/3-in-1;From bed;With upper extremity assist;With armrests Sit to Stand: Patient Percentage: 60% Stand to Sit: 3: Mod assist;To chair/3-in-1;With upper extremity assist;Without upper extremity assist Stand to Sit: Patient Percentage: 50% Stand Pivot Transfers: 1: +2 Total assist Stand Pivot Transfers: Patient Percentage: 50%    Ambulation / Gait / Stairs / Wheelchair Mobility  Ambulation/Gait Ambulation/Gait Assistance: 1: +2 Total assist Ambulation/Gait: Patient Percentage: 60% Ambulation Distance (Feet): 40 Feet Assistive device: Rolling walker;2 person hand held assist Ambulation/Gait Assistance Details: Multimodal cues for correction of R lean; required step-by-stp cues for technique, gait sequence, and control; Stopped each stride cycle to find midline; continued tendency to R lean, but much improved from last session Gait Pattern: Decreased weight shift to left;Ataxic;Right steppage;Left steppage    Posture / Balance Static Sitting Balance Static Sitting - Balance Support: Bilateral upper extremity supported;Feet supported;No upper extremity supported Static Sitting - Level of Assistance: 4: Min assist Static Sitting - Comment/# of Minutes: Worked on trunk symmetry and upper trunk extension/shoulder retraction in sitting; progressed to anterior weight shifting and recovering without phsyical assist with near constant cues for symmetry Static Standing Balance Static Standing - Balance Support: Bilateral upper extremity supported Static Standing - Level of Assistance: 1: +2 Total assist Static Standing - Comment/# of Minutes: Multimodal cues for full hip extension for upright posture as well and Left weight shift for more even distribution of weight btwn feet; Worked on anterior weight shifting over forefeet with  hips in full extension in prep for taking steps    Special needs/care consideration BiPAP/CPAP No CPM No Continuous Drip IV No Dialysis No      Life Vest No Oxygen No Special Bed No Trach Size No Wound Vac (area) No      Skin No                             Bowel mgmt: BM 06/09/13 Bladder mgmt: Voiding in urinal Diabetic mgmt No    Previous Home Environment Living Arrangements: Spouse/significant other  Lives With: Spouse Available Help at Discharge: Family;Available PRN/intermittently Type of Home: House Home Layout: One level Home Access: Stairs to enter Entrance Stairs-Rails: Right Bathroom Shower/Tub: Engineer, manufacturing systems: Standard Home Care Services: No  Discharge Living Setting Plans for Discharge Living Setting: Patient's home;House;Lives with (comment) (Lives with wife.) Type of Home at Discharge: House Discharge Home Layout: One level Discharge Home Access: Stairs to enter Entrance Stairs-Number of Steps: 3 steps plus half step at door. Does the patient have any problems obtaining your medications?: No  Social/Family/Support Systems Patient Roles: Spouse;Parent (Has 2 sons, 1 working, 1 unemployed.) Contact Information: Wilmore Holsomback - wife - [phone] Anticipated Caregiver: Son or wife Anticipated Caregiver's Contact Information: Joshwa Hemric - son (c)  [phone] Ability/Limitations of Caregiver: Wife works 8a to 5p.  Son, Waldron Labs, not currently working and can provide supervision. Caregiver Availability: 24/7 Discharge Plan Discussed with Primary Caregiver: Yes Is Caregiver In Agreement with Plan?: Yes Does Caregiver/Family have Issues with Lodging/Transportation while Pt is in Rehab?: No  Goals/Additional Needs Patient/Family Goal for Rehab: PT/OT mod I/S, no ST needs Expected length of stay: 2 weeks Cultural Considerations: None Dietary Needs: Heart diet Equipment Needs: TBD Pt/Family Agrees to Admission and willing to participate: Yes Program  Orientation Provided & Reviewed with Pt/Caregiver Including Roles  & Responsibilities: Yes (I met with wife, patient and 1 son, Waldron Labs.)   Decrease burden of Care through IP rehab admission:  N/A  Possible need for SNF placement upon discharge: Not likely   Patient Condition: This patient's condition remains as documented in the consult dated 06/10/13, in which the Rehabilitation Physician determined and documented that the patient's condition is appropriate for intensive rehabilitative care in an inpatient rehabilitation facility. Will admit to inpatient rehab today.  Preadmission Screen Completed By:  Trish Mage, 06/11/2013 2:32 PM ______________________________________________________________________   Discussed status with Dr. Riley Kill on 06/11/13 at 1435 and received telephone approval for admission today.  Admission Coordinator:  Trish Mage, time1435/Date07/29/14

## 2013-06-10 NOTE — Progress Notes (Signed)
Physical Therapy Treatment Patient Details Name: [name] MRN: [id number] DOB: 07/02/1961 Today's Date: 06/10/2013 Time: 5621-3086 PT Time Calculation (min): 42 min  PT Assessment / Plan / Recommendation  History of Present Illness Pt with a history of htn, hyperlipidemia, Previous ICH who was in his normal state of health until he laid down for a nap at 1:30pm on 06/07/2013 He stated that for the past few weeks he has felt generally weak, but when he awoke from his nap he had new right sided weakness which he had not had before. He typically compensates for his left sided weakness with his right, but found himself unable to do so on awakening form his nap. MRI shows 1.5 cm region of acute infarction affecting the white matter       Clinical Impression Showing good progress from last session, with improved awareness of balance deficits and better ability to correct R lean with cues; Made good progress with control of steps and increasing amb distance   PT Comments   Very hard worker; Will do quite well at CIR  Follow Up Recommendations  CIR     Does the patient have the potential to tolerate intense rehabilitation     Barriers to Discharge        Equipment Recommendations  Rolling walker with 5" wheels    Recommendations for Other Services    Frequency Min 4X/week   Progress towards PT Goals Progress towards PT goals: Progressing toward goals  Plan Current plan remains appropriate    Precautions / Restrictions Precautions Precautions: Fall   Pertinent Vitals/Pain no apparent distress     Mobility  Bed Mobility Bed Mobility: Supine to Sit;Sitting - Scoot to Edge of Bed Supine to Sit: 4: Min assist Sitting - Scoot to [place] of Bed: 4: Min guard Details for Bed Mobility Assistance: Near constant cues to control and move slowly getting up; Improved control of anterior weight shifts Transfers Transfers: Sit to Stand;Stand to Sit Sit to Stand: 1: +2 Total assist;From  chair/3-in-1;From bed;With upper extremity assist;With armrests Sit to Stand: Patient Percentage: 60% Stand to Sit: 3: Mod assist;To chair/3-in-1;With upper extremity assist;Without upper extremity assist Details for Transfer Assistance: Multimodal cues for more symmetrical weight over feet as pt continues to R lean; cues for slow, controlled rise and descent; performed multiple reps for practice Ambulation/Gait Ambulation/Gait Assistance: 1: +2 Total assist Ambulation/Gait: Patient Percentage: 60% Ambulation Distance (Feet): 40 Feet Assistive device: Rolling walker;2 person hand held assist Ambulation/Gait Assistance Details: Multimodal cues for correction of R lean; required step-by-stp cues for technique, gait sequence, and control; Stopped each stride cycle to find midline; continued tendency to R lean, but much improved from last session Gait Pattern: Decreased weight shift to left;Ataxic;Right steppage;Left steppage Modified Rankin (Stroke Patients Only) Pre-Morbid Rankin Score: No significant disability Modified Rankin: Severe disability    Exercises     PT Diagnosis:    PT Problem List:   PT Treatment Interventions:     PT Goals (current goals can now be found in the care plan section) Acute Rehab PT Goals Patient Stated Goal: to return to being independent Time For Goal Achievement: 06/23/13 Potential to Achieve Goals: Good  Visit Information  Last PT Received On: 06/10/13 Assistance Needed: +2 History of Present Illness: Pt with a history of htn, hyperlipidemia, Previous ICH who was in his normal state of health until he laid down for a nap at 1:30pm on 06/07/2013 He stated that for the past few weeks he  has felt generally weak, but when he awoke from his nap he had new right sided weakness which he had not had before. He typically compensates for his left sided weakness with his right, but found himself unable to do so on awakening form his nap. MRI shows 1.5 cm region of  acute infarction affecting the white matter        Subjective Data  Subjective: Reports control was the hardest part of getting up yesterday Patient Stated Goal: to return to being independent   Cognition  Cognition Arousal/Alertness: Awake/alert Behavior During Therapy: Impulsive Overall Cognitive Status: Within Functional Limits for tasks assessed Safety/Judgement: Decreased awareness of deficits    Balance  Balance Balance Assessed: Yes Static Sitting Balance Static Sitting - Balance Support: Bilateral upper extremity supported;Feet supported;No upper extremity supported Static Sitting - Level of Assistance: 4: Min assist Static Sitting - Comment/# of Minutes: Worked on trunk symmetry and upper trunk extension/shoulder retraction in sitting; progressed to anterior weight shifting and recovering without phsyical assist with near constant cues for symmetry Static Standing Balance Static Standing - Balance Support: Bilateral upper extremity supported Static Standing - Level of Assistance: 1: +2 Total assist Static Standing - Comment/# of Minutes: Multimodal cues for full hip extension for upright posture as well and Left weight shift for more even distribution of weight btwn feet; Worked on anterior weight shifting over forefeet with hips in full extension in prep for taking steps  End of Session PT - End of Session Equipment Utilized During Treatment: Gait belt Activity Tolerance: Patient tolerated treatment well Automotive engineer) Patient left: in chair;with call Mruk/phone within reach;with chair alarm set   GP     Olen Pel Hoven, [place] [phone]  06/10/2013, 1:19 PM

## 2013-06-10 NOTE — Progress Notes (Signed)
Subjective: No headaches, SOB. No facial assym. Reports improved strength in his extremities, but poor co-ordination.   Objective: Vital signs in last 24 hours: Filed Vitals:   06/09/13 2302 06/10/13 0215 06/10/13 0500 06/10/13 1024  BP: 140/90 150/108 140/99 139/61  Pulse: 78 81 80 100  Temp: 97.2 F (36.2 C) 98 F (36.7 C) 98.2 F (36.8 C) 98.2 F (36.8 C)  TempSrc: Oral Oral Oral Oral  Resp: 20 20 20 18   Height:      Weight:      SpO2: 96% 100% 100% 97%   Weight change:   Intake/Output Summary (Last 24 hours) at 06/10/13 1127 Last data filed at 06/10/13 0730  Gross per 24 hour  Intake    540 ml  Output    400 ml  Net    140 ml   Physical Exam,- General- alert, in no distress. Cardiac- RRR, no murmurs, rubs or gallops. Lungs- clear to auscultation. Abd- soft, non tender, No organomegaly. Extr- good periph pulses. Neuro- Alert, OTPP, strenght- 5/5 in all extremities, poor co-ord, failed finger to nose test on Rt, slow response on left.  Lab Results: Basic Metabolic Panel:  Recent Labs Lab 06/07/13 1731 06/07/13 1737 06/08/13 0040 06/09/13 0500  NA 138 140  --  137  K 3.0* 3.1*  --  3.6  CL 98 102  --  103  CO2 28  --   --  27  GLUCOSE 119* 122*  --  103*  BUN 15 16  --  14  CREATININE 1.30 1.30 1.24 1.18  CALCIUM 9.8  --   --  9.4   Liver Function Tests:  Recent Labs Lab 06/07/13 1731  AST 26  ALT 33  ALKPHOS 87  BILITOT 0.5  PROT 7.9  ALBUMIN 3.8   CBC:  Recent Labs Lab 06/07/13 1731 06/07/13 1737 06/08/13 0040  WBC 3.7*  --  3.9*  NEUTROABS 1.3*  --   --   HGB 13.3 14.6 12.6*  HCT 40.6 43.0 36.5*  MCV 71.0*  --  69.9*  PLT 211  --  196   Cardiac Enzymes:  Recent Labs Lab 06/07/13 1731  TROPONINI <0.30   CBG:  Recent Labs Lab 06/07/13 1740  GLUCAP 109*   Hemoglobin A1C:  Recent Labs Lab 06/08/13 1133  HGBA1C 5.8*   Fasting Lipid Panel:  Recent Labs Lab 06/08/13 0040  CHOL 260*  HDL 52  LDLCALC 187*    TRIG 105  CHOLHDL 5.0   Coagulation:  Recent Labs Lab 06/07/13 1731  LABPROT 13.6  INR 1.06   Urine Drug Screen: Drugs of Abuse     Component Value Date/Time   LABOPIA NONE DETECTED 06/07/2013 1844   COCAINSCRNUR NONE DETECTED 06/07/2013 1844   LABBENZ NONE DETECTED 06/07/2013 1844   AMPHETMU NONE DETECTED 06/07/2013 1844   THCU NONE DETECTED 06/07/2013 1844   LABBARB NONE DETECTED 06/07/2013 1844    Alcohol Level:  Recent Labs Lab 06/07/13 1731  ETH <11   Urinalysis:  Recent Labs Lab 06/07/13 1844  COLORURINE YELLOW  LABSPEC 1.022  PHURINE 6.0  GLUCOSEU NEGATIVE  HGBUR NEGATIVE  BILIRUBINUR NEGATIVE  KETONESUR NEGATIVE  PROTEINUR NEGATIVE  UROBILINOGEN 0.2  NITRITE NEGATIVE  LEUKOCYTESUR NEGATIVE    Micro Results: No results found for this or any previous visit (from the past 240 hour(s)). Studies/Results: Mr [name] Contrast  06/08/2013   *RADIOLOGY REPORT*  Clinical Data: Stroke.  Right-sided weakness.  Confusion.  MRI HEAD WITHOUT CONTRAST  Technique:  Multiplanar, multiecho pulse sequences of the brain and surrounding structures were obtained according to standard protocol without intravenous contrast.  Comparison: Head CT 06/07/2013.  MRI 01/08/2007.  Findings: Diffusion imaging shows a 1.5 cm region of acute infarction in the deep white matter adjacent to the posterior body of the left lateral ventricle.  No other acute infarction.  There is old infarction within the right side of the pons.  There are old infarctions in both basal ganglia regions.  There is extensive chronic small vessel disease throughout the hemispheric white matter.  No evidence of mass lesion, acute hemorrhage, hydrocephalus or extra-axial collection.  The old basal ganglia infarctions are associated with hemosiderin deposition.  No pituitary mass.  No inflammatory sinus disease.  No skull or skull base lesion.  IMPRESSION: 1.5 cm region of acute infarction affecting the white matter  adjacent to the posterior body of the left lateral ventricle, with extension into the external capsule region.  No evidence of acute hemorrhage or mass effect.  Extensive old ischemic changes elsewhere throughout the brain as outlined above.   Original Report Authenticated By: Paulina Fusi, M.D.   Medications: I have reviewed the patient's current medications. Scheduled Meds: . aspirin EC  325 mg Oral Daily  . heparin  5,000 Units Subcutaneous Q8H  . multivitamin with minerals  1 tablet Oral Daily   Continuous Infusions:  PRN Meds:.senna-docusate, sodium chloride Assessment/Plan: Principal Problem:   CVA (cerebral infarction) Active Problems:   HYPERLIPIDEMIA   TORTICOLLIS, SPASMODIC   HYPERTENSION  # CVA( Cerebral Infarction)- patient complains of right upper extremity and right lower extremity weakness seemed to have resolved both with persistent coordination deficits. 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 MRI Wo contrast showed- 1.5 cm region of acute infarction affecting the white matter adjacent to the posterior body of the left lateral ventricle, with extension into the external capsule region. No evidence of acute hemorrhage or mass effect. Extensive old ischemic changes elsewhere. HBA1C -mildly elevated at 5.8.  -Continue to hold anti-HTN for now, BP in the past 24hrs range- 139/61- 150/108. Will introduce amylodipine- 10mg  daily. - ASA 325mg  daily.  -Senna  - Echo- Pending. -Carotid dopplers- Bilateral - No evidence of hemodynamically significant ICA stenosis. < 39%. Vertebral artery flow is antegrade.  - Physical medicine and rehab consult, recommendations appreciated patients condition  is appropriate for continued rehabilitative care in the following setting: CIR. Awaiting insurance decision regarding possible inpatient rehab admission. -Neurology recommendations appreciated.  # Hyperlipidemia- Patient was  previously on a statin- elevated transaminases on statin?-40's (2/06) - Hep B/C negative 4/06; resolution on 4/06 labs; off statin in 2006. Records indicate, patient was placed on pravastatin, after reported increase in transaminases in 2006, which he tolerated, but was not compliant with. Px LDL-06/08/2013 - 187. -To restart statins at a low dose and gradually titrate up- Simvastatin- 20mg  daily  # #HTN: home regimen includes amlodipine 10, HCTZ 25, atenolol 50, lisinopril 40  - Will restart amylodipine at 10mg . #CKD 2: Cr stable at baseline ~1.3; likely related to h/o HTN though UA neg for protein but no microalb/cr on file  -avoid nephrotoxic drugs  -AM BMET  #VTE ppx: Heparin TID, change to lovenox, as per neurology recommendations.   Dispo: Disposition is deferred at this time, awaiting improvement of current medical problems.  The patient does have a current PCP Lorretta Harp, MD) and does need an [facility] hospital follow-up appointment after discharge.  The patient does not have transportation limitations that hinder transportation to clinic appointments.  .Services Needed at time of discharge: Y = Yes, Blank = No PT:   OT:   RN:   Equipment:   Other:     LOS: 3 days   Kennis Carina, MD 06/10/2013, 11:27 AM

## 2013-06-10 NOTE — Progress Notes (Signed)
Rehab admissions - Evaluated for possible admission.  I spoke with patient, his wife and his son.  Wife works but can take [employer] after a rehab stay if needed.  Son, [employer], is not currently employed and can stay with patient while wife works.  I have called and faxed information to [employer] for review.  Will await insurance decision regarding possible inpatient rehab admission.  Call me for questions.  #[phone]

## 2013-06-10 NOTE — Consult Note (Signed)
Physical Medicine and Rehabilitation Consult Reason for Consult: CVA Referring Physician: [facility]   HPI: [name] is a 52 y.o. 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 Patient states to be independent prior to admission living with his wife who works day shift. Admitted 06/08/2013 with right-sided weakness. MRI of the brain showed a 1.5 cm region of acute infarction affecting the white matter adjacent to posterior body of the left lateral ventricle with extension into the external capsule region as well as extensive ischemic changes throughout the brain. Carotid Dopplers with less than 40% ICA stenosis. Echocardiogram pending. Urine drug screen was negative. Patient did not receive TPA. Neurology services consulted maintained on aspirin therapy for CVA prophylaxis as well as subcutaneous heparin for DVT prophylaxis. Tolerating a regular consistency diet. Physical and occupational therapy evaluations completed 06/09/2013 with recommendations of physical medicine rehabilitation consult to consider inpatient rehabilitation services.   Review of Systems  Cardiovascular: Positive for leg swelling.  Genitourinary: Positive for urgency.  Neurological: Positive for weakness.  All other systems reviewed and are negative.   Past Medical History  Diagnosis Date  . Hyperlipidemia     elevated transaminases on statin?-40's (2/06) - Hep B/C negative 4/06; resolution on 4/06 labs; off statin in 2006  . Hypertension     concentric LVH 2d echo 12/05; negative proteinuria 11/00  . 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 Diastolic dysfunction     impaired L ventricle relatation with an EF of 65% and echo in December 2005  . Onychomycosis April 2003    right big toe  . Chest pain     with a negative adenosine cardiolite in December 2006. Dr. Robet Leu   . Tinea versicolor     dermatology referral; secondary to no treatement response to selenium sulfide; currently resolive  . 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 Wanda Plump in 9/05. no BPH at that visit  . Erectile dysfunction   . Arthritis    Past Surgical History  Procedure Laterality Date  . Colonoscopy with propofol N/A 05/22/2013    Procedure: COLONOSCOPY WITH PROPOFOL;  Surgeon: Willis Modena, MD;  Location: [facility];  Service: Endoscopy;  Laterality: N/A;   History reviewed. No pertinent family history. Social History:  reports that he has never smoked. He has never used smokeless tobacco. He reports that  drinks alcohol. He reports that he does not use illicit drugs. Allergies: No Known Allergies Medications Prior to Admission  Medication Dose Route Frequency Provider Last Rate Last Dose  . methylPREDNISolone acetate (DEPO-MEDROL) injection 80 mg  80 mg Intra-articular Once Sherrell Puller, MD       Medications Prior to Admission  Medication Sig Dispense Refill  . amLODipine (NORVASC) 10 MG tablet Take 10 mg by mouth every morning.      Marland Kitchen aspirin EC 81 MG tablet Take 81 mg by mouth every morning.       Marland Kitchen  atenolol (TENORMIN) 50 MG tablet Take 50 mg by mouth every morning.      . hydrochlorothiazide (HYDRODIURIL) 25 MG tablet Take 25 mg by mouth every morning.      Marland Kitchen lisinopril (PRINIVIL,ZESTRIL) 40 MG tablet Take 40 mg by mouth every morning.      . Multiple Vitamins-Minerals (HM MULTIVITAMIN ADULT  GUMMY PO) Take 1 tablet by mouth every morning.        Home: Home Living Family/patient expects to be discharged to:: Private residence Living Arrangements: Spouse/significant other Available Help at Discharge: Family;Available PRN/intermittently Type of Home: House Home Access: Stairs to enter Entrance Stairs-Rails: Right Home Layout: One level Home Equipment: Cane - single point  Lives With: Spouse  Functional History: Prior Function Vocation: On disability Functional Status:  Mobility: Bed Mobility Bed Mobility: Supine to Sit;Sitting - Scoot to Edge of Bed Supine to Sit: 3: Mod assist;With rails;HOB elevated Sitting - Scoot to Edge of Bed: 3: Mod assist Transfers Transfers: Sit to Stand;Stand to Sit;Stand Pivot Transfers Sit to Stand: 1: +2 Total assist;From chair/3-in-1;From bed;With upper extremity assist;With armrests Sit to Stand: Patient Percentage: 50% Stand to Sit: 1: +2 Total assist;To chair/3-in-1;With armrests;With upper extremity assist Stand to Sit: Patient Percentage: 50% Stand Pivot Transfers: 1: +2 Total assist Stand Pivot Transfers: Patient Percentage: 50% Ambulation/Gait Ambulation/Gait Assistance: 1: +2 Total assist Ambulation/Gait: Patient Percentage: 50% Ambulation Distance (Feet): 8 Feet Assistive device: Rolling walker;2 person hand held assist Ambulation/Gait Assistance Details: Cues for upright posture, and frequent cues to stop and correct R lean; Pt was able to correct and find midline with cues, but would sink back into R lean within 20 seconds, and especially with effort; Noted tending to use flexion synergy to advance LLE; decr stability in Right stance Gait Pattern: Decreased weight shift to left;Ataxic    ADL: ADL Upper Body Bathing: Simulated;Minimal assistance Where Assessed - Upper Body Bathing: Supported sitting Lower Body Bathing: Simulated;Maximal assistance Where Assessed - Lower Body Bathing: Supported sit to stand Upper Body  Dressing: Performed;Moderate assistance Where Assessed - Upper Body Dressing: Supported sitting Lower Body Dressing: Performed;Maximal assistance Where Assessed - Lower Body Dressing: Supported sit to stand Toilet Transfer: Performed;+2 Total assistance Toilet Transfer Method: Surveyor, minerals: Bedside commode Equipment Used: Gait belt;Rolling walker Transfers/Ambulation Related to ADLs: +2 total assist for balance due to heavy right side lean and motor control deficits in LEs.   ADL Comments: Pt transferred from bed<>3n1<>chair.  Then ambulated ~10 ft with PT/OT +2 assist and RW.  Decreased functional use of bil UEs.   Cognition: Cognition Overall Cognitive Status: Impaired/Different from baseline Arousal/Alertness: Awake/alert Orientation Level: Oriented X4 Attention: Sustained Sustained Attention: Appears intact Memory: Appears intact Awareness: Appears intact (pt desires to get up on his own without calling, personality) Behaviors: Restless Safety/Judgment: Appears intact (pt desires to get up on his own without calling, personality) Cognition Arousal/Alertness: Awake/alert Behavior During Therapy: Impulsive Overall Cognitive Status: Impaired/Different from baseline Area of Impairment: Safety/judgement Safety/Judgement: Decreased awareness of deficits General Comments: Pt somewhat impulsive during mobility and not fully aware of impact his deficits have on mobility.  Blood pressure 150/108, pulse 81, temperature 98 F (36.7 C), temperature source Oral, resp. rate 20, height 5\' 6"  (1.676 m), weight 81.557 kg (179 lb 12.8 oz), SpO2 100.00%. Physical Exam  Vitals reviewed. HENT:  Head: Normocephalic.  Eyes: EOM are normal.  Neck: Normal range of motion. Neck supple. No thyromegaly present.  Cardiovascular: Normal rate and regular rhythm.   Pulmonary/Chest:  Effort normal and breath sounds normal. No respiratory distress.  Abdominal: Soft. Bowel sounds are  normal. He exhibits no distension.  Neurological:  Patient was anxious but able to provide name, place as well as a date of birth. He did follow simple commands. Right pn. Has difficulties with [facility] of right greater than left upper exts. Strength 4/5 RUE and 5/5 on left. RLE is 4+, LLE is 5/5. Decreased sensation to LT in right hand. DTR's 1+ R, 2++ on left  Skin: Skin is warm and dry.  Psychiatric:  A bit anxious, a little impulsive    No results found for this or any previous visit (from the past 24 hour(s)). Mr Brain Wo Contrast  06/08/2013   *RADIOLOGY REPORT*  Clinical Data: Stroke.  Right-sided weakness.  Confusion.  MRI HEAD WITHOUT CONTRAST  Technique:  Multiplanar, multiecho pulse sequences of the brain and surrounding structures were obtained according to standard protocol without intravenous contrast.  Comparison: Head CT 06/07/2013.  MRI 01/08/2007.  Findings: Diffusion imaging shows a 1.5 cm region of acute infarction in the deep white matter adjacent to the posterior body of the left lateral ventricle.  No other acute infarction.  There is old infarction within the right side of the pons.  There are old infarctions in both basal ganglia regions.  There is extensive chronic small vessel disease throughout the hemispheric white matter.  No evidence of mass lesion, acute hemorrhage, hydrocephalus or extra-axial collection.  The old basal ganglia infarctions are associated with hemosiderin deposition.  No pituitary mass.  No inflammatory sinus disease.  No skull or skull base lesion.  IMPRESSION: 1.5 cm region of acute infarction affecting the white matter adjacent to the posterior body of the left lateral ventricle, with extension into the external capsule region.  No evidence of acute hemorrhage or mass effect.  Extensive old ischemic changes elsewhere throughout the brain as outlined above.   Original Report Authenticated By: Paulina Fusi, M.D.    Assessment/Plan: Diagnosis: left  periventricular, EC infarct 1. Does the need for close, 24 hr/day medical supervision in concert with the patient's rehab needs make it unreasonable for this patient to be served in a less intensive setting? Yes 2. Co-Morbidities requiring supervision/potential complications: htn, bph, prior cva 3. Due to bladder management, bowel management, safety, skin/wound care, disease management, medication administration, pain management and patient education, does the patient require 24 hr/day rehab nursing? Yes 4. Does the patient require coordinated care of a physician, rehab nurse, PT (1-2 hrs/day, 5 days/week) and OT (1-2 hrs/day, 5 days/week) to address physical and functional deficits in the context of the above medical diagnosis(es)? Yes Addressing deficits in the following areas: balance, endurance, locomotion, strength, transferring, bowel/bladder control, bathing, dressing, feeding, grooming, toileting and psychosocial support 5. Can the patient actively participate in an intensive therapy program of at least 3 hrs of therapy per day at least 5 days per week? Yes 6. The potential for patient to make measurable gains while on inpatient rehab is excellent 7. Anticipated functional outcomes upon discharge from inpatient rehab are mod I to supervision  with PT, mod I to supervision  with OT, n/a with SLP. 8. Estimated rehab length of stay to reach the above functional goals is: 2 weeks 9. Does the patient have adequate social supports to accommodate these discharge functional goals? Yes and Potentially 10. Anticipated D/C setting: Home 11. Anticipated post D/C treatments: HH therapy 12. Overall Rehab/Functional Prognosis: excellent  RECOMMENDATIONS: This patient's condition is appropriate for  continued rehabilitative care in the following setting: CIR Patient has agreed to participate in recommended program. Yes Note that insurance prior authorization may be required for reimbursement for recommended  care.  Comment: Rehab RN to follow up.   Ranelle Oyster, MD, [place] [facility]     06/10/2013

## 2013-06-10 NOTE — Progress Notes (Signed)
Stroke Team Progress Note  HISTORY HPI: [name] is a 52 y.o. male with a history of htn, hyperlipidemia, Previous ICH who was in his normal state of health until he laid down for a nap at 1:30pm on 06/07/2013 He stated that for the past few weeks he has felt generally weak, but when he awoke from his nap he had new right sided weakness which he had not had before. He typically compensates for his left sided weakness with his right, but found himself unable to do so on awakening form his nap.  LKW: 1:30 pm 06/07/2013 tpa given: no mild new deficits  NIHSS: 7(mostly old, only 2 new points)  SUBJECTIVE "I'm still here." talking about plans for rehab.he would like to get rid of the three shots per day.  OBJECTIVE Most recent Vital Signs: Filed Vitals:   06/09/13 1843 06/09/13 2302 06/10/13 0215 06/10/13 0500  BP: 144/98 140/90 150/108 140/99  Pulse: 79 78 81 80  Temp: 98.2 F (36.8 C) 97.2 F (36.2 C) 98 F (36.7 C) 98.2 F (36.8 C)  TempSrc: Oral Oral Oral Oral  Resp: 18 20 20 20   Height:      Weight:      SpO2: 99% 96% 100% 100%   CBG (last 3)   Recent Labs  06/07/13 1740  GLUCAP 109*    IV Fluid Intake:     MEDICATIONS  . aspirin EC  325 mg Oral Daily  . heparin  5,000 Units Subcutaneous Q8H  . multivitamin with minerals  1 tablet Oral Daily   PRN:  senna-docusate, sodium chloride  Diet:  Cardiac thin liquids Activity: Up with assistance DVT Prophylaxis:  Subcutaneous heparin and SCDs  CLINICALLY SIGNIFICANT STUDIES Basic Metabolic Panel:   Recent Labs Lab 06/07/13 1731 06/07/13 1737 06/08/13 0040 06/09/13 0500  NA 138 140  --  137  K 3.0* 3.1*  --  3.6  CL 98 102  --  103  CO2 28  --   --  27  GLUCOSE 119* 122*  --  103*  BUN 15 16  --  14  CREATININE 1.30 1.30 1.24 1.18  CALCIUM 9.8  --   --  9.4   Liver Function Tests:   Recent Labs Lab 06/07/13 1731  AST 26  ALT 33  ALKPHOS 87  BILITOT 0.5  PROT 7.9  ALBUMIN 3.8   CBC:   Recent  Labs Lab 06/07/13 1731 06/07/13 1737 06/08/13 0040  WBC 3.7*  --  3.9*  NEUTROABS 1.3*  --   --   HGB 13.3 14.6 12.6*  HCT 40.6 43.0 36.5*  MCV 71.0*  --  69.9*  PLT 211  --  196   Coagulation:   Recent Labs Lab 06/07/13 1731  LABPROT 13.6  INR 1.06   Cardiac Enzymes:   Recent Labs Lab 06/07/13 1731  TROPONINI <0.30   Urinalysis:   Recent Labs Lab 06/07/13 1844  COLORURINE YELLOW  LABSPEC 1.022  PHURINE 6.0  GLUCOSEU NEGATIVE  HGBUR NEGATIVE  BILIRUBINUR NEGATIVE  KETONESUR NEGATIVE  PROTEINUR NEGATIVE  UROBILINOGEN 0.2  NITRITE NEGATIVE  LEUKOCYTESUR NEGATIVE   Lipid Panel    Component Value Date/Time   CHOL 260* 06/08/2013 0040   TRIG 105 06/08/2013 0040   HDL 52 06/08/2013 0040   CHOLHDL 5.0 06/08/2013 0040   VLDL 21 06/08/2013 0040   LDLCALC 187* 06/08/2013 0040   HgbA1C  Lab Results  Component Value Date   HGBA1C 5.8* 06/08/2013  Urine Drug Screen:     Component Value Date/Time   LABOPIA NONE DETECTED 06/07/2013 1844   COCAINSCRNUR NONE DETECTED 06/07/2013 1844   LABBENZ NONE DETECTED 06/07/2013 1844   AMPHETMU NONE DETECTED 06/07/2013 1844   THCU NONE DETECTED 06/07/2013 1844   LABBARB NONE DETECTED 06/07/2013 1844    Alcohol Level:   Recent Labs Lab 06/07/13 1731  ETH <11    Ct Head Wo Contrast 06/07/2013 1.  No acute intracranial process.  2.  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  MRI of the brain   06/08/2013 1.5 cm region of acute infarction affecting the white matter  adjacent to the posterior body of the left lateral ventricle, with  extension into the external capsule region. No evidence of acute  hemorrhage or mass effect.  Extensive old ischemic changes elsewhere throughout the brain as  outlined above.  MRA of the brain    2D Echocardiogram    Carotid Doppler  Bilateral: Less than 40% ICA stenosis. Vertebral artery flow is antegrade.   CXR    EKG   sinus rhythm rate 77 beats per minute  Therapy Recommendations CIR  Physical Exam   General - pleasant 52 year old male in bed in no acute distress. Heart - Regular rate and rhythm - no murmer Lungs - Clear to auscultation anteriorly Abdomen - Soft - non tender Extremities - Distal pulses intact - no edema Skin - Warm and dry   Mental Status:  Patient is awake, alert, oriented to person, place, month, year, and situation.  Immediate and remote memory are intact.  Patient is able to give a clear and coherent history.  No signs of aphasia or neglect -  question mild dysarthria Cranial Nerves:  II: Visual Fields are full. Pupils are equal, round, and reactive to light. Discs are difficult to visualize.  III,IV, VI: EOMI without ptosis or diploplia.  V: Facial sensation is symmetric to temperature  VII: Facial movement is notable for mild left droop.  VIII: hearing is intact to voice  X: Uvula elevates symmetrically  XI: Shoulder shrug is symmetric.  XII: tongue is midline without atrophy or fasciculations.  Motor:  Tone is spastic in his left upper extremity. Bulk is normal. He has 4/5 strength in his left arm, but significant spasticity and weakness of left grip. Likewise 4/5 strength in left leg. He has 4+/5 strength in his right arm with a mild pronator drift bilaterally, minimal weakness right leg .impaired coordination on left side.diminished fine finger movements on right. Sensory:  Sensation is diminished on left  Deep Tendon Reflexes:  Slightly increased in the left biceps and patella compared to the right  Cerebellar:  Impaired on left   Gait:  Not tested secondary to weakness   ASSESSMENT [name] is a 52 y.o. male presenting with new right hemiparesis. TPA was not given as his symptoms were mild. MRI of the head 06/08/2013 reveals a 1.5 cm region of acute infarction affecting the white matter adjacent to the posterior body of the left lateral ventricle, with  extension into the external capsule region. 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  On aspirin 81 mg orally every day prior to admission. Now on aspirin 325 mg orally every day for secondary stroke prevention. Patient with resultant right hemiparesis. Work up underway.   Hypokalemia - resolved  Hyperlipidemia - cholesterol 260 LDL 187 - history of elevated liver function tests on statin medications.  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  inpatient rehab recommended at discharge.  F/u 2-D echo  change sq heparin to lovenox for VTE prophylaxis in stroke, based on the PREVAIL trial   Annie Main, MSN, RN, ANVP-BC, ANP-BC, GNP-BC [facility] Pager: [phone] 06/10/2013 9:48 AM  I have personally obtained a history, examined the patient, evaluated imaging results, and formulated the assessment and plan of care. I agree with the above. Delia Heady, MD

## 2013-06-10 NOTE — Progress Notes (Signed)
  Echocardiogram 2D Echocardiogram has been performed.  [name], [name] 06/10/2013, 3:26 PM

## 2013-06-10 NOTE — Progress Notes (Signed)
  Date: 06/10/2013  Patient name: [name]  Medical record number: [id number]  Date of birth: December 28, 1960   This patient has been seen and the plan of care was discussed with the house staff. Please see their note for complete details. I concur with their findings with the following additions/corrections: Mr Pulsifer was off the floor when I visited his room. I discussed his care with Dr Mariea Clonts and he is a candidate for CIR and will be discharged there once insurance issues are arranged. No charge submitted today since I did not see him.   Burns Spain, MD 06/10/2013, 3:18 PM

## 2013-06-11 ENCOUNTER — Inpatient Hospital Stay (HOSPITAL_COMMUNITY)
Admission: RE | Admit: 2013-06-11 | Discharge: 2013-07-09 | DRG: 945 | Disposition: A | Discharge status: Home Health | Payer: Medicare Other | Source: Intra-hospital | Attending: Physical Medicine & Rehabilitation | Admitting: Physical Medicine & Rehabilitation

## 2013-06-11 DIAGNOSIS — I10 Essential (primary) hypertension: Secondary | ICD-10-CM

## 2013-06-11 DIAGNOSIS — G8110 Spastic hemiplegia affecting unspecified side: Secondary | ICD-10-CM

## 2013-06-11 DIAGNOSIS — E785 Hyperlipidemia, unspecified: Secondary | ICD-10-CM | POA: Diagnosis not present

## 2013-06-11 DIAGNOSIS — I6330 Cerebral infarction due to thrombosis of unspecified cerebral artery: Secondary | ICD-10-CM

## 2013-06-11 DIAGNOSIS — R259 Unspecified abnormal involuntary movements: Secondary | ICD-10-CM

## 2013-06-11 DIAGNOSIS — Z79899 Other long term (current) drug therapy: Secondary | ICD-10-CM | POA: Diagnosis not present

## 2013-06-11 DIAGNOSIS — I639 Cerebral infarction, unspecified: Secondary | ICD-10-CM

## 2013-06-11 DIAGNOSIS — M25519 Pain in unspecified shoulder: Secondary | ICD-10-CM

## 2013-06-11 DIAGNOSIS — F1410 Cocaine abuse, uncomplicated: Secondary | ICD-10-CM | POA: Diagnosis not present

## 2013-06-11 DIAGNOSIS — M545 Low back pain, unspecified: Secondary | ICD-10-CM

## 2013-06-11 DIAGNOSIS — R471 Dysarthria and anarthria: Secondary | ICD-10-CM

## 2013-06-11 DIAGNOSIS — N529 Male erectile dysfunction, unspecified: Secondary | ICD-10-CM | POA: Diagnosis not present

## 2013-06-11 DIAGNOSIS — I69959 Hemiplegia and hemiparesis following unspecified cerebrovascular disease affecting unspecified side: Secondary | ICD-10-CM | POA: Diagnosis not present

## 2013-06-11 DIAGNOSIS — Z7982 Long term (current) use of aspirin: Secondary | ICD-10-CM | POA: Diagnosis not present

## 2013-06-11 DIAGNOSIS — Z5189 Encounter for other specified aftercare: Secondary | ICD-10-CM | POA: Diagnosis present

## 2013-06-11 DIAGNOSIS — G243 Spasmodic torticollis: Secondary | ICD-10-CM

## 2013-06-11 DIAGNOSIS — M1990 Unspecified osteoarthritis, unspecified site: Secondary | ICD-10-CM

## 2013-06-11 LAB — CBC
HCT: 39.6 % (ref 39.0–52.0)
Hemoglobin: 13.6 g/dL (ref 13.0–17.0)
MCH: 24 pg — ABNORMAL LOW (ref 26.0–34.0)
MCHC: 34.3 g/dL (ref 30.0–36.0)
MCV: 70 fL — ABNORMAL LOW (ref 78.0–100.0)
RBC: 5.66 MIL/uL (ref 4.22–5.81)

## 2013-06-11 LAB — CREATININE, SERUM: Creatinine, Ser: 1.35 mg/dL (ref 0.50–1.35)

## 2013-06-11 MED ORDER — SORBITOL 70 % SOLN: 30.0000 mL | Freq: Every day | Status: DC | PRN

## 2013-06-11 MED ORDER — BACLOFEN 10 MG PO TABS
10.0000 mg | ORAL_TABLET | Freq: Two times a day (BID) | ORAL | Status: DC
  Administered 2013-06-11 – 2013-06-17 (×12): 10 mg via ORAL
  Filled 2013-06-11 (×14): qty 1

## 2013-06-11 MED ORDER — AMLODIPINE BESYLATE 10 MG PO TABS
10.0000 mg | ORAL_TABLET | Freq: Every day | ORAL | Status: DC
  Administered 2013-06-12 – 2013-07-09 (×28): 10 mg via ORAL
  Filled 2013-06-11 (×29): qty 1

## 2013-06-11 MED ORDER — SENNOSIDES-DOCUSATE SODIUM 8.6-50 MG PO TABS
1.0000 | ORAL_TABLET | Freq: Every evening | ORAL | Status: DC | PRN
  Filled 2013-06-11: qty 1

## 2013-06-11 MED ORDER — ATORVASTATIN CALCIUM 10 MG PO TABS
10.0000 mg | ORAL_TABLET | Freq: Every day | ORAL | Status: DC
  Administered 2013-06-11 – 2013-07-08 (×28): 10 mg via ORAL
  Filled 2013-06-11 (×29): qty 1

## 2013-06-11 MED ORDER — ATORVASTATIN CALCIUM 10 MG PO TABS: 10.0000 mg | ORAL_TABLET | Freq: Every day | ORAL | Status: DC

## 2013-06-11 MED ORDER — SALINE SPRAY 0.65 % NA SOLN: 1.0000 | NASAL | Status: DC | PRN

## 2013-06-11 MED ORDER — ADULT MULTIVITAMIN W/MINERALS CH
1.0000 | ORAL_TABLET | Freq: Every day | ORAL | Status: DC
  Administered 2013-06-12 – 2013-07-09 (×28): 1 via ORAL
  Filled 2013-06-11 (×29): qty 1

## 2013-06-11 MED ORDER — ASPIRIN EC 325 MG PO TBEC
325.0000 mg | DELAYED_RELEASE_TABLET | Freq: Every day | ORAL | Status: DC
  Administered 2013-06-12 – 2013-07-09 (×28): 325 mg via ORAL
  Filled 2013-06-11 (×29): qty 1

## 2013-06-11 MED ORDER — ACETAMINOPHEN 325 MG PO TABS
325.0000 mg | ORAL_TABLET | ORAL | Status: DC | PRN
  Administered 2013-06-13: 650 mg via ORAL
  Filled 2013-06-11 (×2): qty 2

## 2013-06-11 MED ORDER — ENOXAPARIN SODIUM 40 MG/0.4ML ~~LOC~~ SOLN
40.0000 mg | SUBCUTANEOUS | Status: DC
  Administered 2013-06-11 – 2013-07-08 (×28): 40 mg via SUBCUTANEOUS
  Filled 2013-06-11 (×29): qty 0.4

## 2013-06-11 MED ORDER — ONDANSETRON HCL 4 MG/2ML IJ SOLN: 4.0000 mg | Freq: Four times a day (QID) | INTRAMUSCULAR | Status: DC | PRN

## 2013-06-11 MED ORDER — ATORVASTATIN CALCIUM 10 MG PO TABS
10.0000 mg | ORAL_TABLET | Freq: Every day | ORAL | Status: DC
  Filled 2013-06-11: qty 1

## 2013-06-11 MED ORDER — ENOXAPARIN SODIUM 40 MG/0.4ML ~~LOC~~ SOLN: 40.0000 mg | SUBCUTANEOUS | Status: DC

## 2013-06-11 MED ORDER — TRAZODONE HCL 50 MG PO TABS
25.0000 mg | ORAL_TABLET | Freq: Every evening | ORAL | Status: DC | PRN
  Administered 2013-06-12: 25 mg via ORAL
  Filled 2013-06-11: qty 1

## 2013-06-11 MED ORDER — ONDANSETRON HCL 4 MG PO TABS: 4.0000 mg | ORAL_TABLET | Freq: Four times a day (QID) | ORAL | Status: DC | PRN

## 2013-06-11 NOTE — Plan of Care (Signed)
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  Functional Problem List  Patient demonstrates impairments in the following areas: Balance, Medication Management, Motor, Pain, Safety and Skin Integrity  Basic ADL's: eating, grooming, bathing, dressing and toileting Advanced ADL's: simple meal preparation  Transfers:  bed mobility, bed to chair, car and furniture Locomotion:  ambulation, wheelchair mobility and stairs  Additional Impairments:  Functional use of upper extremity  Anticipated Outcomes Item Anticipated Outcome  Eating/Swallowing    Basic self-care  supervision  Tolieting  supervision  Bowel/Bladder  Pt continent of bowel and bladder  Transfers  Supervision overall  Locomotion  Min assist  Communication    Cognition    Pain  Pt will arte pain less than 3 on scale of 0-10  Safety/Judgment  Pt will remain free from falls during admission  Other     Therapy Plan: PT Intensity: Minimum of 1-2 x/day ,45 to 90 minutes PT Frequency: 5 out of 7 days PT Duration Estimated Length of Stay: 4 weeks OT Intensity: Minimum of 1-2 x/day, 45 to 90 minutes OT Frequency: 5 out of 7 days OT Duration/Estimated Length of Stay: 4 weeks      Team Interventions: Item RN PT OT SLP SW TR Other  Self Care/Advanced ADL Retraining  x x      Neuromuscular Re-Education  x x      Therapeutic Activities  x x      UE/LE Strength Training/ROM  x x      UE/LE Coordination Activities  x x      Visual/Perceptual Remediation/Compensation         DME/Adaptive Equipment Instruction  x x      Therapeutic Exercise  x x      Balance/Vestibular Training  x x      Patient/Family Education  x x      Cognitive Remediation/Compensation         Functional Mobility Training  x x      Ambulation/Gait Training  x       Software engineer  x       Wheelchair Propulsion/Positioning  x       Functional Electrical Stimulation  x x      Community Reintegration  x x      Dysphagia/Aspiration Film/video editor         Bladder Management         Bowel Management         Disease Management/Prevention x        Pain Management x  x      Medication Management x        Skin Care/Wound Management         Splinting/Orthotics  x x      Discharge Planning  x x      Psychosocial Support x  x                             Team Discharge Planning: Destination: PT-Home ,OT- Home , SLP-  Projected Follow-up: PT-Home health PT;24 hour supervision/assistance, OT-  Home health OT, SLP-  Projected Equipment Needs: PT- , OT- 3 in 1 bedside comode;Tub/shower bench, SLP-  Patient/family involved in discharge planning: PT- Patient,  OT-Patient, SLP-  MD ELOS: 2 weeks Medical Rehab Prognosis:  Good Assessment: 52 year old male with prior chronic left hemiparesis , admitted with new onset right hemiparesis. Workup revealed new left subcortical infarct .   Now requiring 24/7 Rehab RN,MD, as well as CIR level PT, OT and SLP.  Treatment team will focus on ADLs and mobility with goals set at Supervision level    See Team Conference Notes for weekly updates to the plan of care

## 2013-06-11 NOTE — Progress Notes (Signed)
Stroke Team Progress Note  HISTORY HPI: [name] is a 52 y.o. male with a history of htn, hyperlipidemia, Previous ICH who was in his normal state of health until he laid down for a nap at 1:30pm on 06/07/2013 He stated that for the past few weeks he has felt generally weak, but when he awoke from his nap he had new right sided weakness which he had not had before. He typically compensates for his left sided weakness with his right, but found himself unable to do so on awakening form his nap.  LKW: 1:30 pm 06/07/2013 tpa given: no mild new deficits  NIHSS: 7(mostly old, only 2 new points)  SUBJECTIVE Lying in bed. Plans for rehab today.   OBJECTIVE Most recent Vital Signs: Filed Vitals:   06/10/13 2117 06/11/13 0128 06/11/13 0511 06/11/13 1033  BP: 150/90 131/91 151/96 146/110  Pulse: 95 92 97 88  Temp: 98.3 F (36.8 C) 98.4 F (36.9 C) 98 F (36.7 C) 98 F (36.7 C)  TempSrc: Oral Oral Oral Oral  Resp: 18 20 18 18   Height:      Weight:      SpO2: 100% 97% 98% 100%   CBG (last 3)  No results found for this basename: GLUCAP,  in the last 72 hours  IV Fluid Intake:     MEDICATIONS  . amLODipine  10 mg Oral Daily  . aspirin EC  325 mg Oral Daily  . enoxaparin (LOVENOX) injection  40 mg Subcutaneous Q24H  . multivitamin with minerals  1 tablet Oral Daily  . simvastatin  5 mg Oral q1800   PRN:  senna-docusate, sodium chloride  Diet:  Cardiac thin liquids Activity: Up with assistance DVT Prophylaxis:  Lovenox 40 mg sq daily   CLINICALLY SIGNIFICANT STUDIES Basic Metabolic Panel:   Recent Labs Lab 06/07/13 1731 06/07/13 1737 06/08/13 0040 06/09/13 0500  NA 138 140  --  137  K 3.0* 3.1*  --  3.6  CL 98 102  --  103  CO2 28  --   --  27  GLUCOSE 119* 122*  --  103*  BUN 15 16  --  14  CREATININE 1.30 1.30 1.24 1.18  CALCIUM 9.8  --   --  9.4   Liver Function Tests:   Recent Labs Lab 06/07/13 1731  AST 26  ALT 33  ALKPHOS 87  BILITOT 0.5  PROT 7.9   ALBUMIN 3.8   CBC:   Recent Labs Lab 06/07/13 1731 06/07/13 1737 06/08/13 0040  WBC 3.7*  --  3.9*  NEUTROABS 1.3*  --   --   HGB 13.3 14.6 12.6*  HCT 40.6 43.0 36.5*  MCV 71.0*  --  69.9*  PLT 211  --  196   Coagulation:   Recent Labs Lab 06/07/13 1731  LABPROT 13.6  INR 1.06   Cardiac Enzymes:   Recent Labs Lab 06/07/13 1731  TROPONINI <0.30   Urinalysis:   Recent Labs Lab 06/07/13 1844  COLORURINE YELLOW  LABSPEC 1.022  PHURINE 6.0  GLUCOSEU NEGATIVE  HGBUR NEGATIVE  BILIRUBINUR NEGATIVE  KETONESUR NEGATIVE  PROTEINUR NEGATIVE  UROBILINOGEN 0.2  NITRITE NEGATIVE  LEUKOCYTESUR NEGATIVE   Lipid Panel    Component Value Date/Time   CHOL 260* 06/08/2013 0040   TRIG 105 06/08/2013 0040   HDL 52 06/08/2013 0040   CHOLHDL 5.0 06/08/2013 0040   VLDL 21 06/08/2013 0040   LDLCALC 187* 06/08/2013 0040   HgbA1C  Lab Results  Component Value Date   HGBA1C 5.8* 06/08/2013    Urine Drug Screen:     Component Value Date/Time   LABOPIA NONE DETECTED 06/07/2013 1844   COCAINSCRNUR NONE DETECTED 06/07/2013 1844   LABBENZ NONE DETECTED 06/07/2013 1844   AMPHETMU NONE DETECTED 06/07/2013 1844   THCU NONE DETECTED 06/07/2013 1844   LABBARB NONE DETECTED 06/07/2013 1844    Alcohol Level:   Recent Labs Lab 06/07/13 1731  ETH <11    Ct Head Wo Contrast 06/07/2013 1.  No acute intracranial process.  2.  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  MRI of the brain   06/08/2013 1.5 cm region of acute infarction affecting the white matter  adjacent to the posterior body of the left lateral ventricle, with  extension into the external capsule region. No evidence of acute  hemorrhage or mass effect.  Extensive old ischemic changes elsewhere throughout the brain as  outlined above.  MRA of the brain    2D Echocardiogram  EF 65-70% with no source of embolus.   Carotid Doppler  Bilateral: Less than  40% ICA stenosis. Vertebral artery flow is antegrade.   CXR    EKG  sinus rhythm rate 77 beats per minute  Therapy Recommendations CIR  Physical Exam   General - pleasant 53 year old male in bed in no acute distress. Heart - Regular rate and rhythm - no murmer Lungs - Clear to auscultation anteriorly Abdomen - Soft - non tender Extremities - Distal pulses intact - no edema Skin - Warm and dry   Mental Status:  Patient is awake, alert, oriented to person, place, month, year, and situation.  Immediate and remote memory are intact.  Patient is able to give a clear and coherent history.  No signs of aphasia or neglect -  question mild dysarthria Cranial Nerves:  II: Visual Fields are full. Pupils are equal, round, and reactive to light. Discs are difficult to visualize.  III,IV, VI: EOMI without ptosis or diploplia.  V: Facial sensation is symmetric to temperature  VII: Facial movement is notable for mild left droop.  VIII: hearing is intact to voice  X: Uvula elevates symmetrically  XI: Shoulder shrug is symmetric.  XII: tongue is midline without atrophy or fasciculations.  Motor:  Tone is spastic in his left upper extremity. Bulk is normal. He has 4/5 strength in his left arm, but significant spasticity and weakness of left grip. Likewise 4/5 strength in left leg. He has 4+/5 strength in his right arm with a mild pronator drift bilaterally, minimal weakness right leg .impaired coordination on left side.diminished fine finger movements on right. Sensory:  Sensation is diminished on left  Deep Tendon Reflexes:  Slightly increased in the left biceps and patella compared to the right  Cerebellar:  Impaired on left   Gait:  Not tested secondary to weakness   ASSESSMENT [name] is a 52 y.o. male presenting with new right hemiparesis. TPA was not given as his symptoms were mild. MRI of the head 06/08/2013 reveals a 1.5 cm region of acute infarction affecting the white  matter adjacent to the posterior body of the left lateral ventricle, with extension into the external capsule region. 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  On aspirin 81 mg orally every day prior to admission. Now on aspirin 325 mg orally every day for secondary stroke prevention. Patient with resultant right hemiparesis. Work up completed.   Hypokalemia -  resolved  Hyperlipidemia - cholesterol 260 LDL 187 - history of elevated liver function tests on statin medications. 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  Plans for transfer to inpatient rehab today No further stroke workup indicated. Patient has a 10-15% risk of having another stroke over the next year, the highest risk is within 2 weeks of the most recent stroke/TIA (risk of having a stroke following a stroke or TIA is the same). Ongoing risk factor control by Primary Care Physician Stroke Service will sign off. Please call should any needs arise. Follow up with Dr. Pearlean Brownie, Stroke Clinic, in 2 months.  Annie Main, MSN, RN, ANVP-BC, ANP-BC, [facility] Pager: [phone] 06/11/2013 11:00 AM  I have personally obtained a history, examined the patient, evaluated imaging results, and formulated the assessment and plan of care. I agree with the above. Delia Heady, MD

## 2013-06-11 NOTE — H&P (Signed)
Physical Medicine and Rehabilitation Admission H&P  Chief Complaint   Patient presents with   .  Stroke Symptoms   :  HPI: [name] is a 52 y.o. 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 Patient states to be independent prior to admission living with his wife who works day shift. Admitted 06/08/2013 with right-sided weakness. MRI of the brain showed a 1.5 cm region of acute infarction affecting the white matter adjacent to posterior body of the left lateral ventricle with extension into the external capsule region as well as extensive ischemic changes throughout the brain. Carotid Dopplers with less than 40% ICA stenosis. Echocardiogram with ejection fraction of 70% grade 1 diastolic dysfunction. Urine drug screen was negative. Patient did not receive TPA. Neurology services consulted maintained on aspirin therapy for CVA prophylaxis as well as subcutaneous Lovenox for DVT prophylaxis. Tolerating a regular consistency diet. Physical and occupational therapy evaluations completed 06/09/2013 with recommendations of physical medicine rehabilitation consult to consider inpatient rehabilitation services. Patient was felt to be a good candidate for inpatient rehabilitation services and was admitted for a comprehensive rehabilitation program    Review of Systems  Cardiovascular: Positive for leg swelling.  Genitourinary: Positive for urgency.  Neurological: Positive for weakness.  All other systems reviewed and are negative  Past Medical History   Diagnosis  Date   .  Hyperlipidemia      elevated transaminases on statin?-40's (2/06) - Hep B/C negative 4/06; resolution on 4/06 labs; off statin in 2006   .  Hypertension      concentric LVH 2d echo 12/05; negative proteinuria 11/00   .  Cocaine abuse      last used in 2000; currently in remission; no IVDU to my knowledge   .  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  Diastolic dysfunction      impaired L ventricle relatation with an EF of 65% and echo in December 2005   .  Onychomycosis  April 2003     right big toe   .  Chest pain      with a negative adenosine cardiolite in December 2006. Dr. Robet Leu   .  Tinea versicolor      dermatology referral; secondary to no treatement response to selenium sulfide; currently resolive   .  Prostate disorder      prostate irregularity with obstructive voiding symptoms (PSA 0.67); -likely related to overstimulation of alpha receptors at bladder neck; seen by Dr. Wanda Plump in 9/05. no BPH at that visit   .  Erectile dysfunction    .  Arthritis     Past Surgical History   Procedure  Laterality  Date   .  Colonoscopy with propofol  N/A  05/22/2013     Procedure: COLONOSCOPY WITH PROPOFOL; Surgeon: Willis Modena, MD; Location: [facility]; Service: Endoscopy; Laterality: N/A;    History reviewed. No pertinent family history.  Social History: reports that he has never smoked. He has never used smokeless tobacco. He reports that drinks alcohol. He reports that he does not use illicit drugs.  Allergies: No Known Allergies  Medications Prior to Admission  Medication  Dose  Route  Frequency  Provider  Last Rate  Last Dose   .  methylPREDNISolone acetate (DEPO-MEDROL) injection 80 mg  80 mg  Intra-articular  Once  Sherrell Puller, MD      Medications Prior to Admission   Medication  Sig  Dispense  Refill   .  amLODipine (NORVASC) 10 MG tablet  Take 10 mg by mouth every morning.     Marland Kitchen  aspirin EC 81 MG tablet  Take 81 mg by mouth every morning.     Marland Kitchen  atenolol (TENORMIN) 50 MG tablet  Take 50 mg  by mouth every morning.     .  hydrochlorothiazide (HYDRODIURIL) 25 MG tablet  Take 25 mg by mouth every morning.     Marland Kitchen  lisinopril (PRINIVIL,ZESTRIL) 40 MG tablet  Take 40 mg by mouth every morning.     .  Multiple Vitamins-Minerals (HM MULTIVITAMIN ADULT GUMMY PO)  Take 1 tablet by mouth every morning.      Home:  Home Living  Family/patient expects to be discharged to:: Private residence  Living Arrangements: Spouse/significant other  Available Help at Discharge: Family;Available PRN/intermittently  Type of Home: House  Home Access: Stairs to enter  Entrance Stairs-Rails: Right  Home Layout: One level  Home Equipment: Cane - single point  Lives With: Spouse  Functional History:  Prior Function  Vocation: On disability  Functional Status:  Mobility:  Bed Mobility  Bed Mobility: Supine to Sit;Sitting - Scoot to Edge of Bed  Supine to Sit: 4: Min assist  Sitting - Scoot to [place] of Bed: 4: Min guard  Transfers  Transfers: Sit to Stand;Stand to Sit  Sit to Stand: 1: +2 Total assist;From chair/3-in-1;From bed;With upper extremity assist;With armrests  Sit to Stand: Patient Percentage: 60%  Stand to Sit: 3: Mod assist;To chair/3-in-1;With upper extremity assist;Without upper extremity assist  Stand to Sit: Patient Percentage: 50%  Stand Pivot Transfers: 1: +2 Total assist  Stand Pivot Transfers: Patient Percentage: 50%  Ambulation/Gait  Ambulation/Gait Assistance: 1: +2 Total assist  Ambulation/Gait: Patient Percentage: 60%  Ambulation Distance (Feet): 40 Feet  Assistive device: Rolling walker;2 person hand held assist  Ambulation/Gait Assistance Details: Multimodal cues for correction of R lean; required step-by-stp cues for technique, gait sequence, and control; Stopped each stride cycle to find midline; continued tendency to R lean, but much improved from last session  Gait Pattern: Decreased weight shift to left;Ataxic;Right steppage;Left steppage   ADL:  ADL  Upper Body  Bathing: Simulated;Minimal assistance  Where Assessed - Upper Body Bathing: Supported sitting  Lower Body Bathing: Simulated;Maximal assistance  Where Assessed - Lower Body Bathing: Supported sit to stand  Upper Body Dressing: Performed;Moderate assistance  Where Assessed - Upper Body Dressing: Supported sitting  Lower Body Dressing: Performed;Maximal assistance  Where Assessed - Lower Body Dressing: Supported sit to stand  Toilet Transfer: Performed;+2 Total assistance  Toilet Transfer Method: Archivist: Bedside commode  Equipment Used: Gait belt;Rolling walker  Transfers/Ambulation Related to ADLs: +2 total assist for balance due to heavy right side lean and motor control deficits in LEs.  ADL Comments: Pt transferred from bed<>3n1<>chair. Then ambulated ~10 ft with PT/OT +2 assist and RW. Decreased functional use of bil UEs.  Cognition:  Cognition  Overall Cognitive Status: Within Functional Limits for tasks assessed  Arousal/Alertness: Awake/alert  Orientation Level: Oriented X4  Attention: Sustained  Sustained Attention: Appears intact  Memory: Appears intact  Awareness: Appears intact (pt  desires to get up on his own without calling, personality)  Behaviors: Restless  Safety/Judgment: Appears intact (pt desires to get up on his own without calling, personality)  Cognition  Arousal/Alertness: Awake/alert  Behavior During Therapy: Impulsive  Overall Cognitive Status: Within Functional Limits for tasks assessed  Area of Impairment: Safety/judgement  Safety/Judgement: Decreased awareness of deficits  General Comments: Pt somewhat impulsive during mobility and not fully aware of impact his deficits have on mobility.  Physical Exam:  Blood pressure 131/91, pulse 92, temperature 98.4 F (36.9 C), temperature source Oral, resp. rate 20, height 5\' 6"  (1.676 m), weight 81.557 kg (179 lb 12.8 oz), SpO2 97.00%.   Gen: pt sitting in bed. Generally restless,  anxious HENT: dentition fair Head: Normocephalic.  Eyes: EOM are normal.  Neck: Normal range of motion. Neck supple. No thyromegaly present.  Cardiovascular: Normal rate and regular rhythm. No murmurs Pulmonary/Chest: Effort normal and breath sounds normal. No respiratory distress. No wheezes, rales, rhonchi Abdominal: Soft. Bowel sounds are normal. He exhibits no distension.  Neurological:  Patient was anxious but able to provide name, place as well as a date of birth. He did follow simple commands. Right PN. Has difficulties with [facility] of right greater than left upper exts. Strength 4+/5 RUE proximal to distal and 4+ to 5/5 on left. RLE is 4+, LLE is 5/5. Decreased sensation to LT in right hand. DTR's: 1+ R, 3+ on left lower, 2+ left upper. MAS 1 LUE, 1-2 LLE and somewhat inconsistent. May have element of apraxia as well  Skin: Skin is warm and dry.  Psychiatric:  A bit anxious, a little impulsive  No results found for this or any previous visit (from the past 48 hour(s)).  No results found.  Post Admission Physician Evaluation:  1. Functional deficits secondary to left periventricular, external capsule infardt. 2. Patient is admitted to receive collaborative, interdisciplinary care between the physiatrist, rehab nursing staff, and therapy team. 3. Patient's level of medical complexity and substantial therapy needs in context of that medical necessity cannot be provided at a lesser intensity of care such as a SNF. 4. Patient has experienced substantial functional loss from his/her baseline which was documented above under the "Functional History" and "Functional Status" headings. Judging by the patient's diagnosis, physical exam, and functional history, the patient has potential for functional progress which will result in measurable gains while on inpatient rehab. These gains will be of substantial and practical use upon discharge in facilitating mobility and self-care at the household  level. 5. Physiatrist will provide 24 hour management of medical needs as well as oversight of the therapy plan/treatment and provide guidance as appropriate regarding the interaction of the two. 6. 24 hour rehab nursing will assist with bladder management, bowel management, safety, skin/wound care, disease management, medication administration, pain management and patient education and help integrate therapy concepts, techniques,education, etc. 7. PT will assess and treat for/with: Lower extremity strength, range of motion, stamina, balance, functional mobility, safety, adaptive techniques and equipment, NMR, spasticity mgt education. Goals are: supervision to mod I. 8. OT will assess and treat for/with: ADL's, functional mobility, safety, upper extremity strength, adaptive techniques and equipment, NMR, spasticity mgt, education. Goals are: minimal assist.. 9. SLP will assess and treat for/with: n/a. Goals are: n/a. 10. Case Management and Social Worker will assess and treat for psychological issues and discharge planning. 11. Team conference will be held weekly to assess progress toward goals and to determine barriers to discharge. 12. Patient will receive at  least 3 hours of therapy per day at least 5 days per week. 13. ELOS: 10-14 days  14. Prognosis: excellent Medical Problem List and Plan:  1. Thrombotic left periventricular,EC infarct  2. DVT Prophylaxis/Anticoagulation: Subcutaneous Lovenox. Monitor platelet counts and any signs of bleeding  3. Pain Management: Tylenol as needed  4. Neuropsych: This patient is capable of making decisions on his own behalf.  5. Hypertension. Presently on Norvasc 10 mg daily. Monitor with increased mobility. Patient also on Tenormin 50 mg daily, hydrochlorothiazide 25 mg daily and lisinopril 40 mg daily prior to admission.  6. 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 Latest urine drug screen negative  7. Hyperlipidemia. Zocor   8. 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  -low dose baclofen trial, ROM, splinting with therapy, left sided tone as much a problem as his impaired coordination on the right arm and leg.  Ranelle Oyster, MD, [facility] Physical Medicine & Rehabilitation   06/11/2013

## 2013-06-11 NOTE — Progress Notes (Signed)
Subjective: No complaints today, eager to be start in- px rehab.   Objective: Vital signs in last 24 hours: Filed Vitals:   06/11/13 0128 06/11/13 0511 06/11/13 1033 06/11/13 1418  BP: 131/91 151/96 146/110 130/87  Pulse: 92 97 88 91  Temp: 98.4 F (36.9 C) 98 F (36.7 C) 98 F (36.7 C) 99 F (37.2 C)  TempSrc: Oral Oral Oral Oral  Resp: 20 18 18 18   Height:      Weight:      SpO2: 97% 98% 100% 97%   Weight change:   Intake/Output Summary (Last 24 hours) at 06/11/13 1434 Last data filed at 06/11/13 0835  Gross per 24 hour  Intake    240 ml  Output    200 ml  Net     40 ml   General appearance: alert, cooperative, appears stated age and no distress Head: Normocephalic, without obvious abnormality, atraumatic Lungs: clear to auscultation bilaterally Heart: regular rate and rhythm, S1, S2 normal, no murmur, click, rub or gallop Abdomen: soft, non-tender; bowel sounds normal; no masses,  no organomegaly Extremities: extremities normal, atraumatic, no cyanosis or edema Neuro- Normal strenght in upper extremities, with poor co-ordination.   Lab Results: Basic Metabolic Panel:  Recent Labs Lab 06/07/13 1731 06/07/13 1737 06/08/13 0040 06/09/13 0500  NA 138 140  --  137  K 3.0* 3.1*  --  3.6  CL 98 102  --  103  CO2 28  --   --  27  GLUCOSE 119* 122*  --  103*  BUN 15 16  --  14  CREATININE 1.30 1.30 1.24 1.18  CALCIUM 9.8  --   --  9.4   Liver Function Tests:  Recent Labs Lab 06/07/13 1731  AST 26  ALT 33  ALKPHOS 87  BILITOT 0.5  PROT 7.9  ALBUMIN 3.8   CBC:  Recent Labs Lab 06/07/13 1731 06/07/13 1737 06/08/13 0040  WBC 3.7*  --  3.9*  NEUTROABS 1.3*  --   --   HGB 13.3 14.6 12.6*  HCT 40.6 43.0 36.5*  MCV 71.0*  --  69.9*  PLT 211  --  196   Cardiac Enzymes:  Recent Labs Lab 06/07/13 1731  TROPONINI <0.30   CBG:  Recent Labs Lab 06/07/13 1740  GLUCAP 109*   Hemoglobin A1C:  Recent Labs Lab 06/08/13 1133  HGBA1C 5.8*     Fasting Lipid Panel:  Recent Labs Lab 06/08/13 0040  CHOL 260*  HDL 52  LDLCALC 187*  TRIG 105  CHOLHDL 5.0   Coagulation:  Recent Labs Lab 06/07/13 1731  LABPROT 13.6  INR 1.06   Urine Drug Screen: Drugs of Abuse     Component Value Date/Time   LABOPIA NONE DETECTED 06/07/2013 1844   COCAINSCRNUR NONE DETECTED 06/07/2013 1844   LABBENZ NONE DETECTED 06/07/2013 1844   AMPHETMU NONE DETECTED 06/07/2013 1844   THCU NONE DETECTED 06/07/2013 1844   LABBARB NONE DETECTED 06/07/2013 1844    Alcohol Level:  Recent Labs Lab 06/07/13 1731  ETH <11   Urinalysis:  Recent Labs Lab 06/07/13 1844  COLORURINE YELLOW  LABSPEC 1.022  PHURINE 6.0  GLUCOSEU NEGATIVE  HGBUR NEGATIVE  BILIRUBINUR NEGATIVE  KETONESUR NEGATIVE  PROTEINUR NEGATIVE  UROBILINOGEN 0.2  NITRITE NEGATIVE  LEUKOCYTESUR NEGATIVE    Medications: I have reviewed the patient's current medications. Scheduled Meds: . amLODipine  10 mg Oral Daily  . aspirin EC  325 mg Oral Daily  . atorvastatin  10  mg Oral q1800  . enoxaparin (LOVENOX) injection  40 mg Subcutaneous Q24H  . multivitamin with minerals  1 tablet Oral Daily   Continuous Infusions:  PRN Meds:.senna-docusate, sodium chloride Assessment/Plan:  # CVA( Cerebral Infarction)- patient complains of right upper extremity and right lower extremity weakness seemed to have resolved both with persistent coordination deficits. 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 MRI Wo contrast showed- 1.5 cm region of acute infarction affecting the white matter adjacent to the posterior body of the left lateral ventricle, with extension into the external capsule region. No evidence of acute hemorrhage or mass effect. Extensive old ischemic changes elsewhere. - Dopplers- Bilateral - No evidence of hemodynamically significant ICA stenosis. < 39% and TEE did not show source of emboli, and no hx of  a.fib in this px. -Patients risk factors- Previous stroke, HTN, and hyperlipidemia. -To start px on atorvastatin- 10mg  daily, and gradually titrate up as px tolerates. - Amylodipine- 10mg  daily, started yesterday,about 48 hrs after stroke. Px is on 4 Bp meds at home, to gradually introduce blood pressure medications. - ASA 325mg  daily.  -Carotid dopplers- Bilateral - No evidence of hemodynamically significant ICA stenosis. < 39%. Vertebral artery flow is antegrade.  - Physical medicine and rehab consult, recommendations appreciated patients condition is appropriate for continued rehabilitative care in the following setting: CIR.  - discharge to inpx rehab today. # Hyperlipidemia- Patient was previously on a statin- elevated transaminases on statin?-40's (2/06) - Hep B/C negative 4/06; resolution on 4/06 labs; off statin in 2006. Records indicate, patient was placed on pravastatin, after reported increase in transaminases in 2006, which he tolerated, but was not compliant with. Px LDL-06/08/2013 - 187.  -To restart statins at a low dose and gradually titrate up- Atorvastatin - 10mg  daily  # #HTN: home regimen includes amlodipine 10, HCTZ 25, atenolol 50, lisinopril 40  - Will restart amylodipine at 10mg .  #CKD 2: Cr stable at baseline ~1.3; likely related to h/o HTN though UA neg for protein but no microalb/cr on file  -avoid nephrotoxic drugs. #VTE ppx: Heparin TID, change to lovenox, as per neurology recommendations.  Dispo: Disposition is deferred at this time, awaiting improvement of current medical problems.  Anticipated discharge in approximately today.  The patient does have a current PCP Lorretta Harp, MD) and does need an [facility] hospital follow-up appointment after discharge.  The patient does not have transportation limitations that hinder transportation to clinic appointments.  .Services Needed at time of discharge: Y = Yes, Blank = No PT:   OT:   RN:   Equipment:   Other:     LOS: 4  days   Kennis Carina, MD 06/11/2013, 2:34 PM

## 2013-06-11 NOTE — Progress Notes (Signed)
Rehab admissions - I have approval from [employer] for acute inpatient rehab admission.  Bed available and will admit to inpatient rehab today.  Call me for questions.  #[phone]

## 2013-06-11 NOTE — Progress Notes (Signed)
Pt admitted to 4W03, pt alert and oriented, pt oriented to room, rehab schedule, safety plan, and rehab unit, pt verbalized an understanding, pt resting in bed with call Meunier in reach

## 2013-06-11 NOTE — Progress Notes (Signed)
  Date: 06/11/2013  Patient name: [name]  Medical record number: [id number]  Date of birth: May 15, 1961   This patient has been seen and the plan of care was discussed with the house staff. Please see their note for complete details. I concur with their findings with the following additions/corrections:  Feels well today.  Awaiting rehab admission.  No new neurological deficits.  ASA 325 mg daily. High dose statin with follow up LFTs. Risk factors modification. Follow up with Dr. Pearlean Brownie in 2 months.  Jonah Blue, DO 06/11/2013, 2:31 PM

## 2013-06-11 NOTE — Progress Notes (Signed)
Patient is being discharged room 4N13 at this time. Transferred to CIR. Instructions read and verbalized understanding. Tele d/c'd and iv access. Patient in stable condition at discharge.

## 2013-06-11 NOTE — Discharge Summary (Signed)
Name: [name] MRN: [id number] DOB: 06-26-1961 52 y.o. PCP: Lorretta Harp, MD  Date of Admission: 06/07/2013  5:02 PM Date of Discharge: 06/11/2013 Attending Physician: Jonah Blue, DO  Discharge Diagnosis:  Principal Problem:   CVA (cerebral infarction) Active Problems:   HYPERLIPIDEMIA   TORTICOLLIS, SPASMODIC   HYPERTENSION  Discharge Medications:   Medication List         amLODipine 10 MG tablet  Commonly known as:  NORVASC  Take 10 mg by mouth every morning.     aspirin EC 81 MG tablet  Take 81 mg by mouth every morning.     atenolol 50 MG tablet  Commonly known as:  TENORMIN  Take 50 mg by mouth every morning.     atorvastatin 10 MG tablet  Commonly known as:  LIPITOR  Take 1 tablet (10 mg total) by mouth daily at 6 PM.     HM MULTIVITAMIN ADULT GUMMY PO  Take 1 tablet by mouth every morning.     hydrochlorothiazide 25 MG tablet  Commonly known as:  HYDRODIURIL  Take 25 mg by mouth every morning.     lisinopril 40 MG tablet  Commonly known as:  PRINIVIL,ZESTRIL  Take 40 mg by mouth every morning.        Disposition and follow-up:   [name] was discharged from [facility] in Stable condition.  At the hospital follow up visit please address:  1. Gradual increase in patients atorvastatin dose, was discharged on 10mg .    Follow-up Appointments:     Follow-up Information   Follow up with Gates Rigg, MD. Schedule an appointment as soon as possible for a visit in 2 months. (stroke clinic)    Contact information:   [address] [place] [address] [phone]       Discharge Instructions: Discharge Orders   Future Orders Complete By Expires     Diet - low sodium heart healthy  As directed     Discharge instructions  As directed     Comments:      You were diagnosed with a new stroke.  It is important to keep your blood pressure under control, take daily aspirin and a daily cholesterol  medication (atorvastatin) to prevent further strokes.    Increase activity slowly  As directed        Consultations:  Neurology. Physical Medicine and Rehabilitation Consult.  Procedures Performed:  Ct Head Wo Contrast  06/07/2013   *RADIOLOGY REPORT*  Clinical Data: Right-sided weakness and confusion, history of CVA  CT HEAD WITHOUT CONTRAST  Technique:  Contiguous axial images were obtained from the base of the skull through the vertex without contrast.  Comparison: 01/09/2007; 01/07/2007; brain MRI - 01/08/2007  Findings:  Redemonstrated encephalomalacia within the right basal ganglia with associated asymmetric right-sided centralized volume loss and ex vacuo dilatation of the right lateral ventricle.  Interval resolution of previously noted left basal ganglial intracranial hemorrhage with residual encephalomalacia at this location.  There is persistent rather extensive periventricular hypodensity suggestive of microvascular ischemic disease.  Given the extensive background parenchymal abnormalities, there is no CT evidence of acute large territory infarct.  No intraparenchymal, extra-axial mass or hemorrhage.  There is unchanged chronic minimal left to right midline shift measuring approximately 3 mm in diameter. Limited visualization of the paranasal sinuses and mastoid air cells are normal.  Regional soft tissues are normal.  No displaced calvarial fracture.  IMPRESSION: 1.  No acute intracranial process.  2.  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  Above findings discussed with Dr. Fredderick Phenix at [phone].   Original Report Authenticated By: Tacey Ruiz, MD   Mr Brain Wo Contrast  06/08/2013   *RADIOLOGY REPORT*  Clinical Data: Stroke.  Right-sided weakness.  Confusion.  MRI HEAD WITHOUT CONTRAST  Technique:  Multiplanar, multiecho pulse sequences of the brain and surrounding structures were obtained according to standard protocol  without intravenous contrast.  Comparison: Head CT 06/07/2013.  MRI 01/08/2007.  Findings: Diffusion imaging shows a 1.5 cm region of acute infarction in the deep white matter adjacent to the posterior body of the left lateral ventricle.  No other acute infarction.  There is old infarction within the right side of the pons.  There are old infarctions in both basal ganglia regions.  There is extensive chronic small vessel disease throughout the hemispheric white matter.  No evidence of mass lesion, acute hemorrhage, hydrocephalus or extra-axial collection.  The old basal ganglia infarctions are associated with hemosiderin deposition.  No pituitary mass.  No inflammatory sinus disease.  No skull or skull base lesion.  IMPRESSION: 1.5 cm region of acute infarction affecting the white matter adjacent to the posterior body of the left lateral ventricle, with extension into the external capsule region.  No evidence of acute hemorrhage or mass effect.  Extensive old ischemic changes elsewhere throughout the brain as outlined above.   Original Report Authenticated By: Paulina Fusi, M.D.    2D Echo: TTE- No cardiac source of emboli was indentified  Admission HPI: Chief Complaint: Right sided weakness  History of Present Illness: [name] is a 52 yo AAM with a PMH of HTN, ICH (x2 1999/2005), and hyperlipidemia who presents to the [facility] ED with right-sided weakness. He was subsequently transferred here. His wife states had lunch around 1:30pm and he was fine. He took a nap shortly after eating and woke up around 4:30pm unable to get out of bed due to right-sided weakness. She was able to get him up and walk him to the bathroom. She called EMS at that time. He has some residual left-sided weakness due to his previous ICH but has been compensating with his right side. He denies any CP, SOB, slurred speech, changes in vision, or difficulty swallowing. He admits to some chronic low back pain but no other complaints.  Patient no longer uses cocaine and does not smoke cigarettes. He is not a known diabetic. Review of Systems:  Pertinent items are noted in HPI. Physical Exam:  Vital Signs:  BP: 136/96  Pulse: 77  Temp: 97.5 F (oral)  RR: 20  O2 Sat: 100% on RA  Physical Exam  Constitutional: He is oriented to person, place, and time and well-developed, well-nourished, and in no distress.  HENT:  Head: Normocephalic and atraumatic.  Eyes: Conjunctivae and EOM are normal. Pupils are equal, round, and reactive to light.  Neck: Neck supple.  Cardiovascular: Normal rate, regular rhythm, normal heart sounds and intact distal pulses.  Pulmonary/Chest: Effort normal and breath sounds normal.  Abdominal: Soft. Bowel sounds are normal.  Neurological: He is alert and oriented to person, place, and time. He displays weakness (RUE: 4/5; RLE 4/5; LUE: 5/5; LLE 5/5) and facial asymmetry (slight droop on the left side). He displays normal speech. A sensory deficit (decreased on the left side) is present.  Skin: Skin is warm and dry.    Hospital Course by problem list:   # Lt  CVA- patient presented with right-sided weakness confirmed by in MRI findings of- 1.5 cm region of acute infarction affecting the white matter adjacent to the posterior body of the left lateral ventricle, with extension into the external capsule region. 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 Patient was on ASA 81mg  at home, said he had stopped doing drugs and never smoked cig, and was compliant with his anti-HTn meds. Patient was placed on aspirin - 325mg , antihypert- were with held to allow for permissive HTN, cardiac monitoring with freq neuro checks. No new neurologic deficits developed while on admission. Neurology, PT/Ot, Physical Medicine and Rehabilitation were consulted. TTE, carotid dopplers, were done to determine source of emboli. Telemetry did not  show any abn rhythms. On discharge, px had full strength in extremities, with poor co-ordination, and was discharged to in-px rehab for follow up. Patient was discharged on Amylodipine, lisinopril, atenolol, and HCT.  # Hyperlipidemia- Patient was previously on a statin- elevated transaminases on statin?-40's (2/06) - Hep B/C negative 4/06; resolution on 4/06 labs; off statin in 2006. Records indicate, patient was placed on pravastatin, after reported increase in transaminases in 2006, which he tolerated, but was not compliant with. Px LDL-06/08/2013 - 187.  -To restart and discharge px on statins at a low dose and gradually titrate up- Atorvastatin- 10mg  daily.  # #HTN: Drugs witheld on admission. Home regimen includes amlodipine 10, HCTZ 25, atenolol 50, lisinopril 40. Px was discharged on home meds. #CKD 2: Cr was stable at baseline ~1.3; likely related to h/o HTN though UA neg for protein.   Discharge Vitals:   BP 130/87  Pulse 91  Temp(Src) 99 F (37.2 C) (Oral)  Resp 18  Ht 5\' 6"  (1.676 m)  Wt 179 lb 12.8 oz (81.557 kg)  BMI 29.03 kg/m2  SpO2 97%  Discharge Labs:  No results found for this or any previous visit (from the past 24 hour(s)).  Signed: Kennis Carina, MD 06/11/2013, 2:52 PM   Time Spent on Discharge: 35 minutes

## 2013-06-12 ENCOUNTER — Inpatient Hospital Stay (HOSPITAL_COMMUNITY): Payer: Medicare Other

## 2013-06-12 ENCOUNTER — Inpatient Hospital Stay (HOSPITAL_COMMUNITY): Payer: Medicare Other | Admitting: Occupational Therapy

## 2013-06-12 ENCOUNTER — Inpatient Hospital Stay (HOSPITAL_COMMUNITY): Payer: Medicare Other | Admitting: Rehabilitation

## 2013-06-12 LAB — CBC WITH DIFFERENTIAL/PLATELET
Basophils Absolute: 0 10*3/uL (ref 0.0–0.1)
Basophils Relative: 0 % (ref 0–1)
Eosinophils Relative: 7 % — ABNORMAL HIGH (ref 0–5)
HCT: 38.9 % — ABNORMAL LOW (ref 39.0–52.0)
Lymphocytes Relative: 35 % (ref 12–46)
MCHC: 33.4 g/dL (ref 30.0–36.0)
MCV: 70.3 fL — ABNORMAL LOW (ref 78.0–100.0)
Monocytes Absolute: 0.4 10*3/uL (ref 0.1–1.0)
Neutro Abs: 1.7 10*3/uL (ref 1.7–7.7)
Platelets: 192 10*3/uL (ref 150–400)
RDW: 14.7 % (ref 11.5–15.5)
WBC: 3.7 10*3/uL — ABNORMAL LOW (ref 4.0–10.5)

## 2013-06-12 LAB — COMPREHENSIVE METABOLIC PANEL
ALT: 88 U/L — ABNORMAL HIGH (ref 0–53)
AST: 74 U/L — ABNORMAL HIGH (ref 0–37)
Albumin: 3.5 g/dL (ref 3.5–5.2)
Calcium: 9.3 mg/dL (ref 8.4–10.5)
Creatinine, Ser: 1.25 mg/dL (ref 0.50–1.35)
Sodium: 135 mEq/L (ref 135–145)
Total Protein: 7.2 g/dL (ref 6.0–8.3)

## 2013-06-12 MED ORDER — ZOLPIDEM TARTRATE 5 MG PO TABS
5.0000 mg | ORAL_TABLET | Freq: Every evening | ORAL | Status: DC | PRN
  Administered 2013-06-15 – 2013-06-16 (×3): 5 mg via ORAL
  Filled 2013-06-12 (×4): qty 1

## 2013-06-12 NOTE — Progress Notes (Signed)
Occupational Therapy Session Note  Patient Details  Name: [name] MRN: [id number] Date of Birth: February 03, 1961  Today's Date: 06/12/2013 Time: 1400-1453 Time Calculation (min): 53 min  Short Term Goals: Week 1:  OT Short Term Goal 1 (Week 1): Pt will maintain dynamic sitting balance EOC or bed during bathing with min assist. OT Short Term Goal 2 (Week 1): Pt will perform sit to stand with mod assist during LB selfcare. OT Short Term Goal 3 (Week 1): Pt will perform toilet transfer with mod assist stand pivot. OT Short Term Goal 4 (Week 1): Pt will perform LB bathing sit to stand with mod assist and AE PRN.  Skilled Therapeutic Interventions/Progress Updates:  Therapy session focused on trunk control/strengthening, functional transfers, sit<>stand, and UE coordination. Completed stand pivot transfers bed<>w/c and mat table<>w/c with max assist and verbal cues. Practiced controlled sit<>stand x10 with emphasis on forward flexion with stand and slow controlled descending. Completed bridging exercises while in supine on mat table 4 sets x10 reps for core strengthening. Engaged in reaching to floor to pick up and place items while sitting unsupported with cues for slow, controlled movements. Engaged in BUE coordination activities of reaching across midline to place items with each hand. Pt verbalized being frustrated about things being difficult after this CVA and OT provided education and encouragement. Encouraged neuro psyc to see pt however pt not interested.    Therapy Documentation Precautions:  Precautions Precautions: Fall Precaution Comments: increased extensor tone in L UE/LE, impulsive, ataxic Restrictions Weight Bearing Restrictions: No General:   Vital Signs: Therapy Vitals Temp: 97 F (36.1 C) Temp src: Oral Pulse Rate: 109 BP: 138/96 mmHg Oxygen Therapy SpO2: 97 % Pain: No c/o pain during therapy session.  See FIM for current functional status  Therapy/Group:  Individual Therapy  Darl Kuss, Vara Guardian 06/12/2013, 3:10 PM

## 2013-06-12 NOTE — Patient Care Conference (Signed)
Inpatient RehabilitationTeam Conference and Plan of Care Update Date: 06/12/2013   Time: 11:25 Am    Patient Name: [name]      Medical Record Number: [id number]  Date of Birth: 11/19/1960 Sex: Male         Room/Bed: 4W03C/4W03C-01 Payor Info: Payor: Joette Catching MEDICARE / Plan: [employer] MEDICARE / Product Type: *No Product type* /    Admitting Diagnosis: LT CVA  Admit Date/Time:  06/11/2013  4:56 PM Admission Comments: No comment available   Primary Diagnosis:  Thrombotic cerebral infarction Principal Problem: Thrombotic cerebral infarction  Patient Active Problem List   Diagnosis Date Noted  . 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 CVA (cerebral infarction) 06/08/2013  . Special screening for malignant neoplasms, colon 04/05/2013  . Tick bite 04/01/2013  . Microcytosis 01/08/2013  . Screening for viral disease 01/04/2013  . Plantar fasciitis 11/20/2012  . Shoulder pain 04/10/2012  . Osteoarthritis 03/27/2012  . BPH (benign prostatic hyperplasia) 06/06/2011  . Preventative health care 06/06/2011  . CVA (cerebral vascular accident) 05/06/2011  . TORTICOLLIS, SPASMODIC 04/27/2007  . ERECTILE DYSFUNCTION 09/19/2006  . DRUG ABUSE, HX OF 09/19/2006  . HYPERLIPIDEMIA 09/18/2006  . HYPERTENSION 09/18/2006    Expected Discharge Date: Expected Discharge Date: 07/09/13  Team Members Present: Physician leading conference: Dr. Claudette Laws Social Worker Present: Staci Acosta, LCSW;Becky Jalilah Wiltsie, LCSW Nurse Present: Ethelene Browns, RN PT Present: Cyndia Skeeters, PT;Caroline Sonia Side, PT OT Present: Rosalio Loud, OT;Kris Gellert, OT;Kayla Perkinson, Heath Lark, OT SLP Present: Fae Pippin, SLP     Current Status/Progress Goal Weekly Team Focus  Medical   Chronic left hemiparesis secondary to prior CVA, new right hemiparesis due to acute CVA  Maximize recovery of right upper and right lower limb  Neuromuscular reeducation    Bowel/Bladder   Continent of bowel and bladder  Remain continent of bowel and bladder with min assistance      Swallow/Nutrition/ Hydration     na        ADL's   Max assist for bathing, dressing, toileting, RUE ataxia bu can use functionally with bathing and dressing with increased time.    Goals are overall supervision for toileting, bathing, dressing, and modified independent for grooming and feeding.  Turnk control, balance, selfcare retraining,  UE functional use and stretching.   Mobility   max assist for standing and transfers, +2 assist for ambulation at this time due to severe ataxia  supervision to min assist  static and dynamic balance, gait, safety   Communication     na        Safety/Cognition/ Behavioral Observations    na        Pain   No c/o pain  Pain level at or below 2  Monitor   Skin    No skin issues  No skin infection/breakdown   Monitor skin q shift for signs of breakdown/ infection      *See Care Plan and progress notes for long and short-term goals.  Barriers to Discharge: Bilateral weakness    Possible Resolutions to Barriers:  See above    Discharge Planning/Teaching Needs:    Return home with wife and son-son unemployed right now and can assist while wife is at work     Team Discussion:  Strong on his good side leaning to much though trying to compensate.  New eval in all areas.  Ataxic  Revisions to Treatment Plan:  New eval   Continued Need for Acute Rehabilitation Level of  Care: The patient requires daily medical management by a physician with specialized training in physical medicine and rehabilitation for the following conditions: Daily direction of a multidisciplinary physical rehabilitation program to ensure safe treatment while eliciting the highest outcome that is of practical value to the patient.: Yes Daily medical management of patient stability for increased activity during participation in an intensive rehabilitation regime.:  Yes Daily analysis of laboratory values and/or radiology reports with any subsequent need for medication adjustment of medical intervention for : Neurological problems  Jamar Weatherall, Lemar Livings 06/13/2013, 8:26 AM

## 2013-06-12 NOTE — Progress Notes (Signed)
Occupational Therapy Session Note  Patient Details  Name: JHOEL STIEG MRN: [id number] Date of Birth: 12-28-1960  Today's Date: 06/12/2013 Time: 0900-0930 Time Calculation (min): 30 min  Short Term Goals: Week 1:  OT Short Term Goal 1 (Week 1): Pt will maintain dynamic sitting balance EOC or bed during bathing with min assist. OT Short Term Goal 2 (Week 1): Pt will perform sit to stand with mod assist during LB selfcare. OT Short Term Goal 3 (Week 1): Pt will perform toilet transfer with mod assist stand pivot. OT Short Term Goal 4 (Week 1): Pt will perform LB bathing sit to stand with mod assist and AE PRN.  Skilled Therapeutic Interventions/Progress Updates:   Worked on toileting and toilet transfer during session.  Pt ambulated to the bathroom with max assist and therapist supporting him in front.  Pt with increased fall and LOB to the right during mobility.  Needs mod encouragement to use the impaired LUE for pulling pants up and down.   Performed stand pivot to wheelchair once finished with max assist as well.    Therapy Documentation Precautions:  Precautions Precautions: Fall Precaution Comments: increased extensor tone in L UE/LE, impulsive, ataxic Restrictions Weight Bearing Restrictions: No  Pain: Pain Assessment Pain Assessment: No/denies pain ADL: See FIM for current functional status  Therapy/Group: Individual Therapy  Zachry Hopfensperger 06/12/2013, 12:25 PM

## 2013-06-12 NOTE — Progress Notes (Signed)
Patient ID: [name], male   DOB: December 10, 1960, 52 y.o.   MRN: [id number] Subjective/Complaints: 52 y.o. 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 Patient states to be independent prior to admission living with his wife who works day shift. Admitted 06/08/2013 with right-sided weakness. MRI of the brain showed a 1.5 cm region of acute infarction affecting the white matter adjacent to posterior body of the left lateral ventricle with extension into the external capsule region as well as extensive ischemic changes throughout the brain. Carotid Dopplers with less than 40% ICA stenosis. Echocardiogram with ejection fraction of 70% grade 1 diastolic dysfunction. Urine drug screen was negative. Patient did not receive TPA. Neurology services consulted maintained on aspirin therapy for CVA prophylaxis as well as subcutaneous Lovenox for DVT prophylaxis  "I don't know what my new body can do"  Objective: Vital Signs: Blood pressure 136/90, pulse 85, temperature 98.3 F (36.8 C), temperature source Oral, resp. rate 20, SpO2 95.00%. No results found. Results for orders placed during the hospital encounter of 06/11/13 (from the past 72 hour(s))  CBC     Status: Abnormal   Collection Time    06/11/13  7:37 PM      Result Value Range   WBC 3.9 (*) 4.0 - 10.5 K/uL   RBC 5.66  4.22 - 5.81 MIL/uL   Hemoglobin 13.6  13.0 - 17.0 g/dL   HCT 78.2  95.6 - 21.3 %   MCV 70.0 (*) 78.0 - 100.0 fL   MCH 24.0 (*) 26.0 - 34.0 pg   MCHC 34.3  30.0 - 36.0 g/dL   RDW 08.6  57.8 - 46.9 %   Platelets 204  150 - 400 K/uL  CREATININE, SERUM     Status: Abnormal   Collection Time    06/11/13  7:37 PM      Result Value Range   Creatinine, Ser 1.35  0.50 - 1.35 mg/dL   GFR calc non Af Amer 59 (*) >90 mL/min   GFR calc Af Amer 69 (*) >90 mL/min   Comment:            The eGFR has been calculated     using the  CKD EPI equation.     This calculation has not been     validated in all clinical     situations.     eGFR's persistently     <90 mL/min signify     possible Chronic Kidney Disease.     HEENT: normal Cardio: tachy and no murmurs Resp: CTA B/L and unlabored GI: BS positive and non tender Extremity:  Pulses positive and No Edema Skin:   Intact Neuro: Alert/Oriented, Cranial Nerve II-XII normal, Abnormal Sensory decreased LUE LT, Abnormal Motor 3-/5 L delt,bi,tri,grip,3+ HF,KE ADF,4/5 on R side  Abnormal FMC Ataxic/ dec FMC and Tone  Hypertonic on L side, Tone:  normal tone on R side and Dysarthric Musc/Skel:  Other R shoulder pain with abduction Gen NAD   Assessment/Plan: 1. 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 Physiatrist is providing close team supervision and 24 hour management of active medical problems listed below. Physiatrist and rehab team continue to assess barriers to discharge/monitor patient progress toward functional and medical goals. FIM:  Comprehension Comprehension Mode: Auditory Comprehension: 5-Follows basic conversation/direction: With no assist  Expression Expression Mode: Verbal Expression: 5-Expresses basic needs/ideas: With no assist  Social Interaction Social Interaction: 5-Interacts appropriately 90% of the time - Needs monitoring or encouragement for participation or interaction.        Medical Problem List and Plan:  1. Thrombotic left periventricular,EC infarct  2. DVT Prophylaxis/Anticoagulation: Subcutaneous Lovenox. Monitor platelet counts and any signs of bleeding  3. Pain Management: Tylenol as needed  4. Neuropsych: This patient is capable of making decisions on his own behalf.  5. Hypertension. Presently on Norvasc 10 mg daily. Monitor with  increased mobility. Patient also on Tenormin 50 mg daily, hydrochlorothiazide 25 mg daily and lisinopril 40 mg daily prior to admission.  6. 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 Latest urine drug screen negative  7. Hyperlipidemia. Zocor  8. 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  -low dose baclofen trial, ROM, splinting with therapy, left sided tone as much a problem as his impaired coordination on the right arm and right leg   LOS (Days) 1 A FACE TO FACE EVALUATION WAS PERFORMED  KIRSTEINS,ANDREW E 06/12/2013, 7:34 AM

## 2013-06-12 NOTE — Evaluation (Addendum)
Physical Therapy Assessment and Plan  Patient Details  Name: [name] MRN: [id number] Date of Birth: 11/22/1960  PT Diagnosis: Abnormality of gait, Ataxic gait, Difficulty walking, Hemiparesis dominant, Hypertonia and Muscle weakness Rehab Potential: Excellent ELOS: 4 weeks   Today's Date: 06/12/2013 Time: 0930-1030 Time Calculation (min): 60 min  Problem List:  Patient Active Problem List   Diagnosis Date Noted  . 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 CVA (cerebral infarction) 06/08/2013  . Special screening for malignant neoplasms, colon 04/05/2013  . Tick bite 04/01/2013  . Microcytosis 01/08/2013  . Screening for viral disease 01/04/2013  . Plantar fasciitis 11/20/2012  . Shoulder pain 04/10/2012  . Osteoarthritis 03/27/2012  . BPH (benign prostatic hyperplasia) 06/06/2011  . Preventative health care 06/06/2011  . CVA (cerebral vascular accident) 05/06/2011  . TORTICOLLIS, SPASMODIC 04/27/2007  . ERECTILE DYSFUNCTION 09/19/2006  . DRUG ABUSE, HX OF 09/19/2006  . HYPERLIPIDEMIA 09/18/2006  . HYPERTENSION 09/18/2006    Past Medical History:  Past Medical History  Diagnosis Date  . Hyperlipidemia     elevated transaminases on statin?-40's (2/06) - Hep B/C negative 4/06; resolution on 4/06 labs; off statin in 2006  . Hypertension     concentric LVH 2d echo 12/05; negative proteinuria 11/00  . 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 Diastolic dysfunction     impaired L ventricle relatation with an EF of 65% and echo in December 2005  . Onychomycosis April 2003    right big toe   . Chest pain     with a negative adenosine cardiolite in December 2006. Dr. Robet Leu   . Tinea versicolor     dermatology referral; secondary to no treatement response to selenium sulfide; currently resolive  . 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 Wanda Plump in 9/05. no BPH at that visit  . Erectile dysfunction   . Arthritis    Past Surgical History:  Past Surgical History  Procedure Laterality Date  . Colonoscopy with propofol N/A 05/22/2013    Procedure: COLONOSCOPY WITH PROPOFOL;  Surgeon: Willis Modena, MD;  Location: [facility];  Service: Endoscopy;  Laterality: N/A;    Assessment & Plan Clinical Impression: Patient is a 52 y.o. year old male with history of hypertension, ICH x2 in 1999 as well as 2005 secondary to cocaine with residual left-sided weakness. Patient states to be independent prior to admission living with his wife who works day shift. Admitted 06/08/2013 with right-sided weakness. MRI of the brain showed a 1.5 cm region of acute infarction affecting the white matter adjacent to posterior body of the left lateral ventricle with extension into the external capsule region as well as extensive ischemic changes throughout the brain.  Patient transferred to CIR on 06/11/2013 .   Patient currently requires max with mobility secondary to muscle weakness and impaired timing and sequencing, abnormal tone, unbalanced muscle activation, ataxia, decreased coordination and decreased motor planning.  Prior to hospitalization, patient was independent  with mobility and lived with Spouse;Son in a House home.  Home access is 3Stairs to enter.  Patient will benefit from skilled PT intervention to maximize safe functional mobility, minimize fall risk and decrease caregiver burden for planned discharge home with 24 hour supervision.  Anticipate patient will benefit  from follow up HH at discharge.  PT - End of Session Activity Tolerance: Tolerates 30+ min activity without fatigue Endurance Deficit: No PT Assessment Rehab Potential: Excellent Barriers to Discharge: Decreased caregiver support PT Plan PT Intensity: Minimum of 1-2 x/day ,45 to 90 minutes PT Frequency: 5 out of 7 days PT Duration Estimated Length of Stay: 4 weeks PT Treatment/Interventions: Ambulation/gait training;Balance/vestibular training;Community reintegration;Discharge planning;Disease management/prevention;DME/adaptive equipment instruction;Functional electrical stimulation;Functional mobility training;Neuromuscular re-education;Patient/family education;Splinting/orthotics;Stair training;Therapeutic Activities;Therapeutic Exercise;UE/LE Strength taining/ROM;UE/LE Coordination activities;Wheelchair propulsion/positioning PT Recommendation Follow Up Recommendations: Home health PT;24 hour supervision/assistance Patient destination: Home Equipment Details: equipment needs to be determined upon pts progress  Skilled Therapeutic Intervention Performed sit <> stand x 5 reps with focus on control, forward weight shift, and proper hand placement, LE placement.  Able to perform at mod to max assist (improved with reps).  Performed static standing balance x 2 reps x 30 secs at varying levels of assist (min to max) due to posterior and R lateral leaning.  Cues for keeping UEs relaxed by his side.  Ambulated x 65' x 1 rep and 75' x 1 reps.  First ambulation with 2 person HHA for FIM purpose.  Ambulated second time with 2 person HHA with mirror placed in front of pt for increased visual input of pts R lateral lean.  Pt able to very intermittently correct, however requires max assist and cues for weight shifting to L, decreasing UE WB to increase LE WB.  Also provided cues for increased step length and width as pt tends to scissor gait.    PT  Evaluation Precautions/Restrictions Precautions Precautions: Fall Precaution Comments: increased extensor tone in L UE/LE, impulsive, ataxic Restrictions Weight Bearing Restrictions: No General Chart Reviewed: Yes Additional Pertinent History: Has old L CVA with tone present in UE/LE Family/Caregiver Present: No Vital Signs  Pain Pain Assessment Pain Assessment: No/denies pain Home Living/Prior Functioning Home Living Available Help at Discharge: Available PRN/intermittently;Family (son will be able to check in) Type of Home: House Home Access: Stairs to enter [employer] of Steps: 3 Entrance Stairs-Rails: Right Home Layout: One level Additional Comments: has shower chair and did have removable shower head, but had switched it out.  has single point cane, but was not using it.   Lives With: Spouse;Son Prior Function Level of Independence: Independent with homemaking with ambulation;Independent with gait;Independent with transfers;Needs assistance with ADLs Bath: Minimal (needed assist to wash his left buttocks)  Able to Take Stairs?: Yes Driving: Yes Vocation: On disability Leisure: Hobbies-yes (Comment) Comments: fix cars, work in the yard, work with computers Vision/Perception  Vision - History Baseline Vision: Wears glasses only for reading Patient Visual Report: No change from baseline Vision - Assessment Eye Alignment: Within Functional Limits Vision Assessment: Vision not tested Additional Comments:  (Will continue to further asses with function.) Perception Perception: Impaired Spatial Orientation: Decreased spatial awareness when sitting EOB and EOC, Praxis Praxis: Impaired Praxis Impairment Details: Ideomotor;Motor planning  Cognition Overall Cognitive Status: Within Functional Limits for tasks assessed Arousal/Alertness: Awake/alert Orientation Level: Oriented X4 Attention: Sustained;Selective;Alternating Sustained Attention: Appears  intact Selective Attention: Appears intact Alternating Attention: Impaired Alternating Attention Impairment: Functional complex Memory: Appears intact Awareness: Appears intact Problem Solving: Appears intact Behaviors: Impulsive Safety/Judgment: Impaired Comments: Pt with awareness of deficits but  can be impulsive at times. Sensation Sensation Light Touch: Impaired Detail Light Touch Impaired Details: Impaired LLE Proprioception: Impaired Detail Proprioception Impaired Details: Impaired RLE;Impaired LLE Additional Comments: All light touch and pressure intact except for L3 dermatome in LLE.  Pt inconsistent with proprioception testing.  Coordination Gross Motor Movements are Fluid and Coordinated: No Motor  Motor Motor: Ataxia;Abnormal tone;Hemiplegia Motor - Skilled Clinical Observations: Pt with flexor tone in LUE, trunk demos some extensor tone tendencies   Mobility Bed Mobility Bed Mobility: Supine to Sit;Sit to Supine Supine to Sit: 4: Min guard Sit to Supine: 4: Min guard Locomotion  Ambulation Ambulation/Gait Assistance: 1: +2 Total assist Ambulation Distance (Feet): 65 Feet Assistive device: 2 person hand held assist Ambulation/Gait Assistance Details: Tactile cues for placement;Tactile cues for sequencing;Tactile cues for weight shifting;Tactile cues for weight beaing;Verbal cues for sequencing;Tactile cues for posture;Verbal cues for technique;Verbal cues for precautions/safety;Manual facilitation for weight shifting;Manual facilitation for weight bearing;Verbal cues for gait pattern Ambulation/Gait Assistance Details: Ambulated with 2 person HHA with noted increased leaning towards R side, however pt unable to self correct Gait Gait: Yes Gait Pattern: Impaired Gait Pattern: Decreased weight shift to left;Left steppage;Right steppage;Right foot flat;Ataxic;Lateral trunk lean to right;Scissoring  Trunk/Postural Assessment  Cervical Assessment Cervical Assessment:  Within Functional Limits Thoracic Assessment Thoracic Assessment: Within Functional Limits Lumbar Assessment Lumbar Assessment: Within Functional Limits Postural Control Postural Control: Deficits on evaluation (able to self correct trunk LOB)  Balance Balance Balance Assessed: Yes Static Sitting Balance Static Sitting - Balance Support: Feet supported;Right upper extremity supported Static Sitting - Level of Assistance: 5: Stand by assistance;4: Min assist Static Standing Balance Static Standing - Balance Support: Right upper extremity supported Static Standing - Level of Assistance: 2: Max assist;4: Min assist;3: Mod assist Static Standing - Comment/# of Minutes: varying levels of assist with standing balance due to posterior and L lateral leaning.  Pt unable to self correct and requires manual facilitation and cues for more upright posture.  Extremity Assessment  RUE Assessment RUE Assessment: Exceptions to [facility] RUE Strength RUE Overall Strength Comments: Pt with full AROM in all joints, demonstrates ataxia with FM and reaching tasks.  Overall strength at least 4/5 throughout. LUE Assessment LUE Assessment: Exceptions to [facility] LUE Strength LUE Overall Strength Comments: Pt with history of left hemiparesis from previous CVA.  Pt with increased elbow extensor and flexor tone as well as digit flexion but he is able to use as an active assist for bathing and dressing but with limited coordination.  Brunnstrum stage V movement in the arm and hand. RLE Assessment RLE Assessment: Exceptions to [facility] RLE Strength RLE Overall Strength: Deficits RLE Overall Strength Comments: hip flex 4/5 (does compensate with trunk extension), knee flex/ext 5/5 (again compensates with trunk extension), ankle DF 2-/5, ankle PF 5/5 LLE Assessment LLE Assessment: Exceptions to [facility] LLE Strength LLE Overall Strength: Deficits LLE Overall Strength Comments: hip flex 4/5, knee flex/ext 5/5 (compensates with trunk  extension), ankle PF 5/5, ankle DF 3+/5  FIM:  FIM - Bed/Chair Transfer Bed/Chair Transfer: 2: Supine > Sit: Max A (lifting assist/Pt. 25-49%);2: Sit > Supine: Max A (lifting assist/Pt. 25-49%) FIM - Locomotion: Ambulation Ambulation/Gait Assistance: 1: +2 Total assist    Refer to Care Plan for Long Term Goals  Recommendations for other services: None  Discharge Criteria: Patient will be discharged from PT if patient refuses treatment 3 consecutive times without medical reason, if treatment goals not met, if there is a change in medical status, if  patient makes no progress towards goals or if patient is discharged from hospital.  The above assessment, treatment plan, treatment alternatives and goals were discussed and mutually agreed upon: by patient  [name] 06/12/2013, 11:48 AM

## 2013-06-12 NOTE — Progress Notes (Signed)
Social Work Assessment and Plan Social Work Assessment and Plan  Patient Details  Name: [name] MRN: [id number] Date of Birth: 09/07/1961  Today's Date: 06/12/2013  Problem List:  Patient Active Problem List   Diagnosis Date Noted  . 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 CVA (cerebral infarction) 06/08/2013  . Special screening for malignant neoplasms, colon 04/05/2013  . Tick bite 04/01/2013  . Microcytosis 01/08/2013  . Screening for viral disease 01/04/2013  . Plantar fasciitis 11/20/2012  . Shoulder pain 04/10/2012  . Osteoarthritis 03/27/2012  . BPH (benign prostatic hyperplasia) 06/06/2011  . Preventative health care 06/06/2011  . CVA (cerebral vascular accident) 05/06/2011  . TORTICOLLIS, SPASMODIC 04/27/2007  . ERECTILE DYSFUNCTION 09/19/2006  . DRUG ABUSE, HX OF 09/19/2006  . HYPERLIPIDEMIA 09/18/2006  . HYPERTENSION 09/18/2006   Past Medical History:  Past Medical History  Diagnosis Date  . Hyperlipidemia     elevated transaminases on statin?-40's (2/06) - Hep B/C negative 4/06; resolution on 4/06 labs; off statin in 2006  . Hypertension     concentric LVH 2d echo 12/05; negative proteinuria 11/00  . 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 Diastolic dysfunction     impaired L ventricle relatation with an EF of 65% and echo in December 2005  . Onychomycosis April 2003    right big toe  . Chest pain     with a negative adenosine cardiolite in December 2006. Dr. Robet Leu   . Tinea versicolor     dermatology referral; secondary to no treatement response to selenium sulfide;  currently resolive  . 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 Wanda Plump in 9/05. no BPH at that visit  . Erectile dysfunction   . Arthritis    Past Surgical History:  Past Surgical History  Procedure Laterality Date  . Colonoscopy with propofol N/A 05/22/2013    Procedure: COLONOSCOPY WITH PROPOFOL;  Surgeon: Willis Modena, MD;  Location: [facility];  Service: Endoscopy;  Laterality: N/A;   Social History:  reports that he has never smoked. He has never used smokeless tobacco. He reports that  drinks alcohol. He reports that he does not use illicit drugs.  Family / Support Systems Marital Status: Married Patient Roles: Spouse;Parent Spouse/Significant Other: Amy [phone] Children: [name]-son [phone] Other Supports: Another son who is local Anticipated Caregiver: Son and wife Ability/Limitations of Caregiver: Wife works 8-5 pm daily and communtes to [place], son unemployed right now and can assist Caregiver Availability: 24/7 Family Dynamics: Close knit family who are involved and supportive of one another.  Pt tries his best and want sot be independent level, not have to rely upon his son.  Social History Preferred language: English Religion: Baptist Cultural Background: No issues Education: Recently graduated from [employer] 2012 Read: Yes Write: Yes Employment Status: Disabled Date Retired/Disabled/Unemployed: partial Fish farm manager Issues: No issues Guardian/Conservator: None-according to MD pt is capable of making hsi own decisions while here   Abuse/Neglect Physical Abuse: Denies Verbal Abuse: Denies Sexual Abuse: Denies Exploitation of  patient/patient's resources: Denies Self-Neglect: Denies  Emotional Status Pt's affect, behavior adn adjustment status: Pt is motivated to improve and wants to reach mod/i level  before he leaves here.  His family supports he is very independent and will do what it takes to get to this level. Recent Psychosocial Issues: Other medical issues-residual weakness from a previous CVA. Pyschiatric History: No history-deferred depression screen due to pt felt it was not necessary.  Will continue to monitor his coping and have Neuro psych see if necessary, but also follow along Substance Abuse History: History of abuse but has quit, he and wife express.  Patient / Family Perceptions, Expectations & Goals Pt/Family understanding of illness & functional limitations: Pt and wife can explain his storke and deficits, all are hopeful he will do well and recover from this stroke.  He has done well in the past and is motivated and will work hard to improve again. Premorbid pt/family roles/activities: Husband, Father, Retiree, Engineer, maintenance (IT), Home owner, etc Anticipated changes in roles/activities/participation: resume Pt/family expectations/goals: Pt states: " I want to be able to do for myself, before I leave."  Wife states: " I am hopeful he will do well here and will take FMLA if necessary."  [employer]: Other (Comment) (HAd OP in the past) Premorbid Home Care/DME Agencies: None Transportation available at discharge: Family Resource referrals recommended: Support group (specify) (CVA Support group)  Discharge Planning Living Arrangements: Spouse/significant other;Children Support Systems: Spouse/significant other;Children;Other relatives;Friends/neighbors Type of Residence: Private residence Insurance Resources: Media planner (specify) Risk manager Medicare) Financial Resources: Family Support;SSD Financial Screen Referred: No Living Expenses: Lives with family Money Management: Spouse;Patient Does the patient have any problems obtaining your medications?: No Home Management: Both he and wife Patient/Family Preliminary Plans: Return home with  assistance from son and/or wife if necessary.  He is hoping he will be mod/i level.  Has done OP in the past and wants again. Social Work Anticipated Follow Up Needs: HH/OP;Support Group  Clinical Impression Pleasant gentleman who is motivated and willing to work hard to accomplish his goals.  Supportive wife and son who are willing to assist him if needed. Wife may take a FMLA but will look into this more and get back with this worker.  Follow to work on a discharge plan.  Lucy Chris 06/12/2013, 2:40 PM

## 2013-06-12 NOTE — Care Management Note (Signed)
[facility]patient [facility] Individual Statement of Services  Patient Name:  [name]  Date:  06/12/2013  Welcome to the [facility]patient [facility].  Our goal is to provide you with an individualized program based on your diagnosis and situation, designed to meet your specific needs.  With this comprehensive rehabilitation program, you will be expected to participate in at least 3 hours of rehabilitation therapies Monday-Friday, with modified therapy programming on the weekends.  Your rehabilitation program will include the following services:  Physical Therapy (PT), Occupational Therapy (OT), Speech Therapy (ST), 24 hour per day rehabilitation nursing, Therapeutic Recreaction (TR), Neuropsychology, Case Management ( Social Worker), Rehabilitation Medicine, Nutrition Services and Pharmacy Services  Weekly team conferences will be held on Wednesday to discuss your progress.  Your Social Worker will talk with you frequently to get your input and to update you on team discussions.  Team conferences with you and your family in attendance may also be held.  Expected length of stay: 4 weeks Overall anticipated outcome: supervision level  Depending on your progress and recovery, your program may change. Your Social Worker will coordinate services and will keep you informed of any changes. Your Child psychotherapist names and contact numbers are listed  below.  The following services may also be recommended but are not provided by the Inpatient Rehabilitation Center:   Driving Evaluations  Home Health Rehabiltiation Services  Outpatient Rehabilitatation Servives    Arrangements will be made to provide these services after discharge if needed.  Arrangements include referral to agencies that provide these services.  Your insurance has been verified to be:  [employer] Your primary doctor is:  Ixilin Niu  Pertinent information will be shared with your doctor and your insurance  company.  Social Worker:  Dossie Der, [place] [phone]  Information discussed with and copy given to patient by: Lucy Chris, 06/12/2013, 10:06 AM

## 2013-06-12 NOTE — Discharge Summary (Signed)
  Date: 06/12/2013  Patient name: [name]  Medical record number: [id number]  Date of birth: 1961/02/01   This patient has been discussed with the house staff. Please see their note for complete details. I concur with their findings and plan.  Jonah Blue, DO 06/12/2013, 3:05 PM

## 2013-06-12 NOTE — Progress Notes (Signed)
Social Work Patient ID: [name], male   DOB: 04/25/1961, 53 y.o.   MRN: [id number] Met with pt, wife and son to inform of team conference goals-supervision level and discharge 8/26.  Son is available to provide supervision while Mom/wife works.  Wife may take a FMLA but want sot look into more and get back with this worker.  All in agreement with goals and pt is trying hard in Therapies.

## 2013-06-12 NOTE — Progress Notes (Signed)
Patient information reviewed and entered into eRehab system by Shelvy Perazzo, RN, CRRN, PPS Coordinator.  Information including medical coding and functional independence measure will be reviewed and updated through discharge.     Per nursing patient was given "Data Collection Information Summary for Patients in Inpatient Rehabilitation Facilities with attached "Privacy Act Statement-Health Care Records" upon admission.  

## 2013-06-12 NOTE — Evaluation (Signed)
Occupational Therapy Assessment and Plan  Patient Details  Name: [name] MRN: [id number] Date of Birth: [birth year]  OT Diagnosis: hemiplegia affecting dominant side and muscle weakness (generalized) Rehab Potential: Rehab Potential: Excellent ELOS: 4 weeks   Today's Date: 06/12/2013 Time: 0801- 0900 Time Calculation (min): 59 min  Problem List:  Patient Active Problem List   Diagnosis Date Noted  . 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 CVA (cerebral infarction) 06/08/2013  . Special screening for malignant neoplasms, colon 04/05/2013  . Tick bite 04/01/2013  . Microcytosis 01/08/2013  . Screening for viral disease 01/04/2013  . Plantar fasciitis 11/20/2012  . Shoulder pain 04/10/2012  . Osteoarthritis 03/27/2012  . BPH (benign prostatic hyperplasia) 06/06/2011  . Preventative health care 06/06/2011  . CVA (cerebral vascular accident) 05/06/2011  . TORTICOLLIS, SPASMODIC 04/27/2007  . ERECTILE DYSFUNCTION 09/19/2006  . DRUG ABUSE, HX OF 09/19/2006  . HYPERLIPIDEMIA 09/18/2006  . HYPERTENSION 09/18/2006    Past Medical History:  Past Medical History  Diagnosis Date  . Hyperlipidemia     elevated transaminases on statin?-40's (2/06) - Hep B/C negative 4/06; resolution on 4/06 labs; off statin in 2006  . Hypertension     concentric LVH 2d echo 12/05; negative proteinuria 11/00  . 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 Diastolic dysfunction     impaired L ventricle relatation with an EF of 65% and echo in December 2005  . Onychomycosis April 2003    right big toe  . Chest pain      with a negative adenosine cardiolite in December 2006. Dr. Robet Leu   . Tinea versicolor     dermatology referral; secondary to no treatement response to selenium sulfide; currently resolive  . 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 Wanda Plump in 9/05. no BPH at that visit  . Erectile dysfunction   . Arthritis    Past Surgical History:  Past Surgical History  Procedure Laterality Date  . Colonoscopy with propofol N/A 05/22/2013    Procedure: COLONOSCOPY WITH PROPOFOL;  Surgeon: Willis Modena, MD;  Location: [facility];  Service: Endoscopy;  Laterality: N/A;    Assessment & Plan Clinical Impression: Patient is a 52 y.o. year old male with recent admission to the hospital on  Admitted 06/08/2013 with right-sided weakness. MRI of the brain showed a 1.5 cm region of acute infarction affecting the white matter adjacent to posterior body of the left lateral ventricle with extension into the external capsule region as well as extensive ischemic changes throughout the brain. Carotid Dopplers with less than 40% ICA stenosis. Echocardiogram with ejection fraction of 70% grade 1 diastolic dysfunction. Urine drug screen was negative. Patient did not receive TPA.  Patient transferred to CIR on 06/11/2013 .    Patient currently requires max with basic self-care skills secondary to muscle weakness and muscle joint tightness and impaired timing and sequencing, abnormal tone, unbalanced muscle activation, ataxia, decreased coordination and decreased motor planning.  Prior to hospitalization, patient could complete ADLs with modified independent .  Patient will benefit from skilled intervention to decrease level of assist with basic self-care skills, increase independence with basic self-care skills and increase level of independence with iADL prior to discharge home with care  partner.  Anticipate patient will require 24 hour supervision and follow up home health.  OT - End of Session Activity Tolerance: Tolerates 30+ min activity without fatigue Endurance Deficit: No OT Assessment Rehab Potential: Excellent Barriers to Discharge: Decreased caregiver support Barriers to Discharge Comments: pt's wife works days, son will come over some but unsure if he has 24 hour OT Plan OT Intensity: Minimum of 1-2 x/day, 45 to 90 minutes OT Frequency: 5 out of 7 days OT Duration/Estimated Length of Stay: 4 weeks OT Treatment/Interventions: Balance/vestibular training;Community reintegration;Discharge planning;Functional mobility training;Functional electrical stimulation;DME/adaptive equipment instruction;Neuromuscular re-education;Patient/family education;Pain management;Psychosocial support;Therapeutic Activities;Splinting/orthotics;Self Care/advanced ADL retraining;UE/LE Strength taining/ROM;UE/LE Coordination activities OT Recommendation Patient destination: Home Follow Up Recommendations: Home health OT Equipment Recommended: 3 in 1 bedside comode;Tub/shower bench   Skilled Therapeutic Intervention During session began education on trunk control while performing selfcare tasks.  Pt overall max assist for sitting balance with LOB posteriorly with attempted scooting EOB.  Demonstrates increased extensor patterns in the trunk with all dynamic movement.  Ataxia with RUE functional use during bathing.    OT Evaluation Precautions/Restrictions  Precautions Precautions: Fall Precaution Comments: increased extensor tone in L UE/LE, impulsive, ataxic Restrictions Weight Bearing Restrictions: No  Pain Pain Assessment Pain Assessment: No/denies pain Home Living/Prior Functioning Home Living Available Help at Discharge: Available PRN/intermittently;Family (son will be able to check in) Type of Home: House Home Access: Stairs to enter [employer] of Steps:  3 Entrance Stairs-Rails: Right Home Layout: One level Additional Comments: has shower chair and did have removable shower head, but had switched it out.  has single point cane, but was not using it.   Lives With: Spouse;Son IADL History Current License: No Prior Function Level of Independence: Independent with homemaking with ambulation;Independent with gait;Independent with transfers;Needs assistance with ADLs Bath: Minimal (needed assist to wash his left buttocks)  Able to Take Stairs?: Yes Driving: Yes Vocation: On disability Leisure: Hobbies-yes (Comment) Comments: fix cars, work in the yard, work with computers ADL  See FIM scale Vision/Perception  Vision - History Baseline Vision: Wears glasses only for reading Patient Visual Report: No change from baseline Vision - Assessment Eye Alignment: Within Functional Limits Vision Assessment: Vision not tested Additional Comments:  (Will continue to further asses with function.) Perception Perception: Impaired Spatial Orientation: Decreased spatial awareness when sitting EOB and EOC, Praxis Praxis: Impaired Praxis Impairment Details: Ideomotor;Motor planning  Cognition Overall Cognitive Status: Within Functional Limits for tasks assessed Arousal/Alertness: Awake/alert Orientation Level: Oriented X4 Attention: Sustained;Selective;Alternating Sustained Attention: Appears intact Selective Attention: Appears intact Alternating Attention: Impaired Alternating Attention Impairment: Functional complex Memory: Appears intact Awareness: Appears intact Problem Solving: Appears intact Behaviors: Impulsive Safety/Judgment: Impaired Comments: Pt with awareness of deficits but can be impulsive at times. Sensation Sensation Light Touch: Impaired Detail Light Touch Impaired Details: Impaired LUE;Impaired RUE Stereognosis: Impaired Detail Stereognosis Impaired Details: Impaired RUE;Impaired LUE Proprioception: Appears  Intact Proprioception Impaired Details: Impaired RLE;Impaired LLE Additional Comments: All light touch and pressure intact except for L3 dermatome in LLE.  Pt inconsistent with proprioception testing.  Coordination Gross Motor Movements are Fluid and Coordinated: No Motor  Motor Motor: Hemiplegia;Abnormal postural alignment and control;Ataxia;Abnormal tone Motor - Skilled Clinical Observations: Pt with increased extensor posturing with attempted dynamic movement and also exhibits increased ataxia with function use of  the RUE.  Increased tone noted in the LUE flexors and extensors of the elbow and flexers of the hand. Mobility  Bed Mobility Bed Mobility: Supine to Sit Supine to Sit: 2: Max assist Supine to Sit Details: Verbal cues for technique Sitting - Scoot to Edge of Bed: 2: Max assist Sit to Supine: 4: Min guard Transfers Sit to Stand: 2: Max assist;Without upper extremity assist;From toilet Sit to Stand Details: Manual facilitation for weight shifting Stand to Sit: With upper extremity assist;2: Max assist;To toilet  Trunk/Postural Assessment  Cervical Assessment Cervical Assessment: Within Functional Limits Thoracic Assessment Thoracic Assessment: Within Functional Limits Lumbar Assessment Lumbar Assessment: Within Functional Limits Postural Control Postural Control: Deficits on evaluation Trunk Control: Pt needing max assist for dynamic trunk activities during bathing and dressing tasks EOB. Righting Reactions: Pt with decreased ability to right himself with LOB posteriorly in sitting.  Relied on the foot of the bed and his LUE to help self correct.  Balance Balance Balance Assessed: Yes Static Sitting Balance Static Sitting - Balance Support: Right upper extremity supported;Left upper extremity supported Static Sitting - Level of Assistance: 4: Min assist Dynamic Sitting Balance Dynamic Sitting - Balance Support: No upper extremity supported Dynamic Sitting - Level of  Assistance: 2: Max assist Static Standing Balance Static Standing - Balance Support: Right upper extremity supported;Left upper extremity supported Static Standing - Level of Assistance: 2: Max assist Static Standing - Comment/# of Minutes: Pt able to stand for 1-2 mins during LB bathing and to pull his pants over his hips. Extremity/Trunk Assessment RUE Assessment RUE Assessment: Exceptions to [facility] RUE Strength RUE Overall Strength Comments: Pt with full AROM in all joints, demonstrates ataxia with FM and reaching tasks.  Overall strength at least 4/5 throughout. LUE Assessment LUE Assessment: Exceptions to [facility] LUE Strength LUE Overall Strength Comments: Pt with history of left hemiparesis from previous CVA.  Pt with increased elbow extensor and flexor tone as well as digit flexion but he is able to use as an active assist for bathing and dressing but with limited coordination.  Brunnstrum stage V movement in the arm and hand.  FIM:  FIM - Eating Eating Activity: 5: Set-up assist for open containers FIM - Grooming Grooming Steps: Wash, rinse, dry face;Wash, rinse, dry hands;Oral care, brush teeth, clean dentures Grooming: 5: Supervision: safety issues or verbal cues FIM - Bathing Bathing Steps Patient Completed: Chest;Right Arm;Left Arm;Abdomen;Right upper leg;Left upper leg Bathing: 3: Mod-Patient completes 5-7 24f 10 parts or 50-74% FIM - Upper Body Dressing/Undressing Upper body dressing/undressing steps patient completed: Thread/unthread right sleeve of pullover shirt/dresss;Thread/unthread left sleeve of pullover shirt/dress;Put head through opening of pull over shirt/dress;Pull shirt over trunk Upper body dressing/undressing: 4: Steadying assist FIM - Lower Body Dressing/Undressing Lower body dressing/undressing steps patient completed: Thread/unthread left underwear leg;Thread/unthread left pants leg;Don/Doff right shoe Lower body dressing/undressing: 2: Max-Patient completed  25-49% of tasks FIM - Toileting Toileting: 1: Total-Patient completed zero steps, helper did all 3 FIM - Bed/Chair Transfer Bed/Chair Transfer: 4: Supine > Sit: Min A (steadying Pt. > 75%/lift 1 leg);4: Sit > Supine: Min A (steadying pt. > 75%/lift 1 leg);2: Bed > Chair or W/C: Max A (lift and lower assist);2: Chair or W/C > Bed: Max A (lift and lower assist) FIM - Diplomatic Services operational officer Devices: Elevated toilet seat;Grab bars Toilet Transfers: 2-To toilet/BSC: Max A (lift and lower assist);2-From toilet/BSC: Max A (lift and lower assist) FIM - Tub/Shower Transfers Tub/shower Transfers: 0-Activity did not occur  or was simulated   Refer to Care Plan for Long Term Goals  Recommendations for other services: None  Discharge Criteria: Patient will be discharged from OT if patient refuses treatment 3 consecutive times without medical reason, if treatment goals not met, if there is a change in medical status, if patient makes no progress towards goals or if patient is discharged from hospital.  The above assessment, treatment plan, treatment alternatives and goals were discussed and mutually agreed upon: by patient  Haylynn Pha OTR/L 06/12/2013, 12:08 PM

## 2013-06-13 ENCOUNTER — Encounter (HOSPITAL_COMMUNITY): Payer: Medicare Other | Admitting: Occupational Therapy

## 2013-06-13 ENCOUNTER — Inpatient Hospital Stay (HOSPITAL_COMMUNITY): Payer: Medicare Other | Admitting: Occupational Therapy

## 2013-06-13 ENCOUNTER — Inpatient Hospital Stay (HOSPITAL_COMMUNITY): Payer: Medicare Other

## 2013-06-13 DIAGNOSIS — G8110 Spastic hemiplegia affecting unspecified side: Secondary | ICD-10-CM

## 2013-06-13 DIAGNOSIS — I6330 Cerebral infarction due to thrombosis of unspecified cerebral artery: Secondary | ICD-10-CM

## 2013-06-13 MED ORDER — HYDROCHLOROTHIAZIDE 25 MG PO TABS
25.0000 mg | ORAL_TABLET | Freq: Every day | ORAL | Status: DC
  Administered 2013-06-13 – 2013-07-09 (×27): 25 mg via ORAL
  Filled 2013-06-13 (×29): qty 1

## 2013-06-13 NOTE — Progress Notes (Signed)
Patient ID: [name], male   DOB: [birth year], 52 y.o.   MRN: [id number] Subjective/Complaints: 52 y.o. 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 Patient states to be independent prior to admission living with his wife who works day shift. Admitted 06/08/2013 with right-sided weakness. MRI of the brain showed a 1.5 cm region of acute infarction affecting the white matter adjacent to posterior body of the left lateral ventricle with extension into the external capsule region as well as extensive ischemic changes throughout the brain. Carotid Dopplers with less than 40% ICA stenosis. Echocardiogram with ejection fraction of 70% grade 1 diastolic dysfunction. Urine drug screen was negative. Patient did not receive TPA. Neurology services consulted maintained on aspirin therapy for CVA prophylaxis as well as subcutaneous Lovenox for DVT prophylaxis  Continues to focus on changes which have happened which "he has to get used to". Baclofen is helping left arm and leg. Slept well A 12 point review of systems has been performed and if not noted above is otherwise negative.   Objective: Vital Signs: Blood pressure 142/102, pulse 92, temperature 98.2 F (36.8 C), temperature source Oral, resp. rate 20, weight 63.504 kg (140 lb), SpO2 100.00%. No results found. Results for orders placed during the hospital encounter of 06/11/13 (from the past 72 hour(s))  CBC     Status: Abnormal   Collection Time    06/11/13  7:37 PM      Result Value Range   WBC 3.9 (*) 4.0 - 10.5 K/uL   RBC 5.66  4.22 - 5.81 MIL/uL   Hemoglobin 13.6  13.0 - 17.0 g/dL   HCT 65.7  84.6 - 96.2 %   MCV 70.0 (*) 78.0 - 100.0 fL   MCH 24.0 (*) 26.0 - 34.0 pg   MCHC 34.3  30.0 - 36.0 g/dL   RDW 95.2  84.1 - 32.4 %   Platelets 204  150 - 400 K/uL  CREATININE, SERUM     Status: Abnormal   Collection Time    06/11/13  7:37 PM       Result Value Range   Creatinine, Ser 1.35  0.50 - 1.35 mg/dL   GFR calc non Af Amer 59 (*) >90 mL/min   GFR calc Af Amer 69 (*) >90 mL/min   Comment:            The eGFR has been calculated     using the CKD EPI equation.     This calculation has not been     validated in all clinical     situations.     eGFR's persistently     <90 mL/min signify     possible Chronic Kidney Disease.  CBC WITH DIFFERENTIAL     Status: Abnormal   Collection Time    06/12/13  6:30 AM      Result Value Range   WBC 3.7 (*) 4.0 - 10.5 K/uL   RBC 5.53  4.22 - 5.81 MIL/uL   Hemoglobin 13.0  13.0 - 17.0 g/dL   HCT 40.1 (*) 02.7 - 25.3 %   MCV 70.3 (*) 78.0 - 100.0 fL   MCH 23.5 (*) 26.0 - 34.0 pg   MCHC 33.4  30.0 - 36.0 g/dL   RDW 66.4  40.3 - 47.4 %   Platelets 192  150 - 400 K/uL   Neutrophils Relative % 46  43 - 77 %  Neutro Abs 1.7  1.7 - 7.7 K/uL   Lymphocytes Relative 35  12 - 46 %   Lymphs Abs 1.3  0.7 - 4.0 K/uL   Monocytes Relative 12  3 - 12 %   Monocytes Absolute 0.4  0.1 - 1.0 K/uL   Eosinophils Relative 7 (*) 0 - 5 %   Eosinophils Absolute 0.3  0.0 - 0.7 K/uL   Basophils Relative 0  0 - 1 %   Basophils Absolute 0.0  0.0 - 0.1 K/uL  COMPREHENSIVE METABOLIC PANEL     Status: Abnormal   Collection Time    06/12/13  6:30 AM      Result Value Range   Sodium 135  135 - 145 mEq/L   Potassium 3.7  3.5 - 5.1 mEq/L   Chloride 101  96 - 112 mEq/L   CO2 25  19 - 32 mEq/L   Glucose, Bld 99  70 - 99 mg/dL   BUN 11  6 - 23 mg/dL   Creatinine, Ser 8.29  0.50 - 1.35 mg/dL   Calcium 9.3  8.4 - 56.2 mg/dL   Total Protein 7.2  6.0 - 8.3 g/dL   Albumin 3.5  3.5 - 5.2 g/dL   AST 74 (*) 0 - 37 U/L   ALT 88 (*) 0 - 53 U/L   Alkaline Phosphatase 87  39 - 117 U/L   Total Bilirubin 0.7  0.3 - 1.2 mg/dL   GFR calc non Af Amer 65 (*) >90 mL/min   GFR calc Af Amer 75 (*) >90 mL/min   Comment:            The eGFR has been calculated     using the CKD EPI equation.     This calculation has not  been     validated in all clinical     situations.     eGFR's persistently     <90 mL/min signify     possible Chronic Kidney Disease.     HEENT: normal Cardio: tachy and no murmurs Resp: CTA B/L and unlabored GI: BS positive and non tender Extremity:  Pulses positive and No Edema Skin:   Intact Neuro: Alert/Oriented, Cranial Nerve II-XII normal, Abnormal Sensory decreased LUE LT, Abnormal Motor 3-/5 L delt,bi,tri,grip,3+ HF,KE ADF,4/5 on R side  Abnormal FMC Ataxic/ dec FMC and Tone  Hypertonic on L side 2/4, Tone:  normal tone on R side and Dysarthric Musc/Skel:  Other R shoulder pain with abduction Gen NAD   Assessment/Plan: 1. 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 Physiatrist is providing close team supervision and 24 hour management of active medical problems listed below. Physiatrist and rehab team continue to assess barriers to discharge/monitor patient progress toward functional and medical goals. FIM: FIM - Bathing Bathing Steps Patient Completed: Chest;Right Arm;Left Arm;Abdomen;Right upper leg;Left upper leg Bathing: 3: Mod-Patient completes 5-7 59f 10 parts or 50-74%  FIM - Upper Body Dressing/Undressing Upper body dressing/undressing steps patient completed: Thread/unthread right sleeve of pullover shirt/dresss;Thread/unthread left sleeve of pullover shirt/dress;Put head through opening of pull over shirt/dress;Pull shirt over trunk Upper body dressing/undressing: 4: Steadying assist FIM - Lower Body Dressing/Undressing Lower body dressing/undressing steps patient completed: Thread/unthread left underwear leg;Thread/unthread left pants leg;Don/Doff right shoe Lower body dressing/undressing: 2: Max-Patient completed 25-49% of tasks  FIM - Toileting Toileting: 1: Total-Patient completed zero steps,  helper  did all 3  FIM - Diplomatic Services operational officer Devices: Elevated toilet seat;Grab bars Toilet Transfers: 2-To toilet/BSC: Max A (lift and lower assist);2-From toilet/BSC: Max A (lift and lower assist)  FIM - Bed/Chair Transfer Bed/Chair Transfer: 4: Supine > Sit: Min A (steadying Pt. > 75%/lift 1 leg);4: Sit > Supine: Min A (steadying pt. > 75%/lift 1 leg);2: Bed > Chair or W/C: Max A (lift and lower assist);2: Chair or W/C > Bed: Max A (lift and lower assist)  FIM - Locomotion: Wheelchair Distance: 25' Locomotion: Wheelchair: 1: Travels less than 50 ft with moderate assistance (Pt: 50 - 74%) FIM - Locomotion: Ambulation Locomotion: Ambulation Assistive Devices:  (2 person HHA) Ambulation/Gait Assistance: 1: +2 Total assist Locomotion: Ambulation: 1: Two helpers  Comprehension Comprehension Mode: Auditory Comprehension: 5-Follows basic conversation/direction: With no assist  Expression Expression Mode: Verbal Expression: 6-Expresses complex ideas: With extra time/assistive device  Social Interaction Social Interaction: 5-Interacts appropriately 90% of the time - Needs monitoring or encouragement for participation or interaction.  Problem Solving Problem Solving: 5-Solves basic problems: With no assist  Memory Memory: 5-Recognizes or recalls 90% of the time/requires cueing < 10% of the time  Medical Problem List and Plan:  1. Thrombotic left periventricular,EC infarct  2. DVT Prophylaxis/Anticoagulation: Subcutaneous Lovenox. Monitor platelet counts and any signs of bleeding  3. Pain Management: Tylenol as needed  4. Neuropsych: This patient is capable of making decisions on his own behalf.  5. Hypertension. Presently on Norvasc 10 mg daily. Monitor with increased mobility. Patient also on Tenormin 50 mg daily, hydrochlorothiazide 25 mg daily and lisinopril 40 mg daily prior to admission. --resume hctz today 6. History of ICH x2 secondary cocaine with  residual left-sided weakness. Latest urine drug screen negative  7. Hyperlipidemia. Zocor  8. 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  -low dose baclofen trial, ROM, splinting with therapy, left sided tone as much a problem as his impaired coordination on the right arm and right leg   LOS (Days) 2 A FACE TO FACE EVALUATION WAS PERFORMED  [name] T 06/13/2013, 8:42 AM

## 2013-06-13 NOTE — Progress Notes (Signed)
Occupational Therapy Session Note  Patient Details  Name: TORIE PRIEBE MRN: [id number] Date of Birth: 1961/08/11  Today's Date: 06/13/2013 Time: 1027-2536 Time Calculation (min): 30 min  Skilled Therapeutic Interventions/Progress Updates:    Worked on trunk stretches and neuromuscular re-education during session.  Performed lateral mobilizations in sitting with pt demonstrating increased tightness on the right side for active trunk shortening used in reciprical scooting.  Performed rotational stretches to both sides in sitting and then transitioned to supine for hip/ trunk stretches side to side with knees flexed.  Worked on supine to sidelying on the left side to help incorporate active trunk flexion and rotation.  Also worked on scooting forward to [employer] with reciprical scooting.  Pt needing supervision and max instructional cueing for sequencing and technique.  Performed squat pivot transfer to the left for transfer back to the wheelchair with min assist.    Therapy Documentation Precautions:  Precautions Precautions: Fall Precaution Comments: increased extensor tone in L UE/LE, impulsive, ataxic Restrictions Weight Bearing Restrictions: No  Vital Signs: Therapy Vitals Pulse Rate: 84 BP: 144/88 mmHg Patient Position, if appropriate: Sitting Pain: Pain Assessment Pain Assessment: 0-10 Pain Score: 5  Pain Type: Acute pain Pain Location: Back Pain Orientation: Mid;Lower Pain Descriptors / Indicators: Aching Pain Frequency: Intermittent Pain Onset: With Activity Patients Stated Pain Goal: 3 Pain Intervention(s): RN made aware Multiple Pain Sites: No  Therapy/Group: Individual Therapy  Jamarkis Branam OTR/L 06/13/2013, 12:23 PM

## 2013-06-13 NOTE — Progress Notes (Signed)
Occupational Therapy Session Note  Patient Details  Name: [name] MRN: [id number] Date of Birth: 1961/01/19  Today's Date: 06/13/2013 Time: 1300-1350 Time Calculation (min): 50 min  Skilled Therapeutic Interventions/Progress Updates:    Pt performed toilet transfer and worked on toileting to begin the session.  Performed short distance mobility to the elevated toilet with mod assist.  Pt needing mod assist for standing balance while managing his clothing.  He was able to perform toilet hygiene with min guard assist in sitting using the grab bar on the left side for balance support.  Mod assist for standing to manage clothing again and transfer back to wheelchair.  Finished session in the gym with work on trunk mobility with lateral weightshifts for reciprical scooting.  Pt was placed in right sidelying initially and sheet placed under trunk for mobilization of shortening on the right side.  Transitioned to sitting to work on active trunk shortening for scooting reciprically.  Pt able to scoot with supervision but continues to attempt LE extensor patterns with scooting back onto the mat.  Pt is doing a better job of slowing down and flexing trunk for all transitional movements sit to stand and for scooting.  Encouraged pt to help scoot using both UEs as well.    Therapy Documentation Precautions:  Precautions Precautions: Fall Precaution Comments: increased extensor tone in L UE/LE, impulsive, ataxic Restrictions Weight Bearing Restrictions: No  Pain: Pain Assessment Pain Assessment: No/denies pain Pain Score: 4  Pain Location: Back Pain Onset: With Activity Pain Intervention(s): RN made aware  See FIM for current functional status  Therapy/Group: Individual Therapy  [name] OTR/L 06/13/2013, 3:39 PM

## 2013-06-13 NOTE — Progress Notes (Signed)
Physical Therapy Session Note  Patient Details  Name: [name] MRN: [id number] Date of Birth: 03-29-1961  Today's Date: 06/13/2013 Time: 1000-1100 Time Calculation (min): 60 min  Short Term Goals: Week 1:  PT Short Term Goal 1 (Week 1): Pt will perform stand pivot transfer at mod assist and min cuing for technique.  PT Short Term Goal 2 (Week 1): Pt will perform dynamic sitting balance at min assist level PT Short Term Goal 3 (Week 1): Pt will ambulate x 77' with LRAD at max assist PT Short Term Goal 4 (Week 1): Pt will correct R lateral leaning in sitting/standing with min assist and min cues.  PT Short Term Goal 5 (Week 1): Pt will perform dynamic standing balance activities at mod assist.   Skilled Therapeutic Interventions/Progress Updates:  Treatment focused on neuromuscular re-education via demo, VCS, tactile and manual cues in sitting:  for pelvic dissociation with wt shifting for scooting forward/backward, wt shifting with trunk lengthening/shortening with reaching , trunk flexion/extension without use of UEs for wt shifting> hip elevation for squat pivot transfers.  In standing:  static with = wt bearing, wt shifting L<>R with bil UE support fading to no UE support, wt shifting A><P attempting to elicit ankle strategy.  Pt has long- standing habits of ballistic motions with RUE and RLE in order to compensate for his hemiparetic L side.  He is able to slow down and activate L side given time and cues.    At rest, pt leans L slightly in sitting; 1 visual and VC needed to self correct.  Pt required close supervision during dynamic sitting activity, reaching well out of BOS L,R, forward starting with bil LE support on floor, fading to no support of LEs.  Pt is safest with "low and slow" squat pivot transfers.  Pt's low back was painful and fatigued from trunk activation by end of session.    Therapy Documentation Precautions:  Precautions Precautions: Fall Precaution  Comments: increased extensor tone in L UE/LE, impulsive, ataxic Restrictions Weight Bearing Restrictions: No   Vital Signs: Therapy Vitals Pulse Rate: 84 BP: 144/88 mmHg Patient Position, if appropriate: Sitting Pain: Pain Assessment Pain Assessment: 0-10 Pain Score: 5  Pain Type: Acute pain Pain Location: Back Pain Orientation: Mid;Lower Pain Descriptors / Indicators: Aching Pain Frequency: Intermittent Pain Onset: With Activity Patients Stated Pain Goal: 3 Pain Intervention(s): RN made aware Multiple Pain Sites: No   Other Treatments: Treatments Neuromuscular Facilitation: Left;Right;Lower Extremity;Forced use;Upper Extremity;Activity to increase grading;Activity to increase timing and sequencing;Activity to increase motor control;Activity to increase sustained activation;Activity to increase lateral weight shifting;Activity to increase anterior-posterior weight shifting  See FIM for current functional status  Therapy/Group: Individual Therapy  Trai Ells 06/13/2013, 12:11 PM

## 2013-06-13 NOTE — Progress Notes (Signed)
Occupational Therapy Session Note  Patient Details  Name: [name] MRN: [id number] Date of Birth: Oct 30, 1961  Today's Date: 06/13/2013 Time: 0905-1004 Time Calculation (min): 59 min  Short Term Goals: Week 1:  OT Short Term Goal 1 (Week 1): Pt will maintain dynamic sitting balance EOC or bed during bathing with min assist. OT Short Term Goal 2 (Week 1): Pt will perform sit to stand with mod assist during LB selfcare. OT Short Term Goal 3 (Week 1): Pt will perform toilet transfer with mod assist stand pivot. OT Short Term Goal 4 (Week 1): Pt will perform LB bathing sit to stand with mod assist and AE PRN.  Skilled Therapeutic Interventions/Progress Updates:    Pt performed bathing and dressing sit to stand this session.  Performed standing for LB washing of peri area and for pulling pants over hips with mod facilitation.  Needs max instructional cueing for technique to scoot forward in the chair and for sit to stand, as pt demonstrates very limited trunk flexion with transitional movements.  Encouraged pt to integrate the LUE into function as well for washing, squeezing out the washcloth, and for sit to stand.  Pt also with frequent LOB posteriorly in standing, needing use of at least one UE on the sink and mod assist from therapist while pulling pants over his hips.    Therapy Documentation Precautions:  Precautions Precautions: Fall Precaution Comments: increased extensor tone in L UE/LE, impulsive, ataxic Restrictions Weight Bearing Restrictions: No  Pain: Pain Assessment Pain Assessment: No/denies pain Pain Score: 0-No pain Patients Stated Pain Goal: 3 Multiple Pain Sites: No ADL: See FIM for current functional status  Therapy/Group: Individual Therapy  Darry Kelnhofer OTR/L 06/13/2013, 10:13 AM

## 2013-06-14 ENCOUNTER — Inpatient Hospital Stay (HOSPITAL_COMMUNITY): Payer: Medicare Other | Admitting: *Deleted

## 2013-06-14 ENCOUNTER — Encounter (HOSPITAL_COMMUNITY): Payer: Medicare Other | Admitting: Occupational Therapy

## 2013-06-14 ENCOUNTER — Inpatient Hospital Stay (HOSPITAL_COMMUNITY): Payer: Medicare Other | Admitting: Rehabilitation

## 2013-06-14 NOTE — Progress Notes (Signed)
Physical Therapy Session Note  Patient Details  Name: [name] MRN: [id number] Date of Birth: Oct 15, 1961  Today's Date: 06/14/2013 Time: 1030-1130 Time Calculation (min): 60 min  Short Term Goals: Week 1:  PT Short Term Goal 1 (Week 1): Pt will perform stand pivot transfer at mod assist and min cuing for technique.  PT Short Term Goal 2 (Week 1): Pt will perform dynamic sitting balance at min assist level PT Short Term Goal 3 (Week 1): Pt will ambulate x 3' with LRAD at max assist PT Short Term Goal 4 (Week 1): Pt will correct R lateral leaning in sitting/standing with min assist and min cues.  PT Short Term Goal 5 (Week 1): Pt will perform dynamic standing balance activities at mod assist.   Skilled Therapeutic Interventions/Progress Updates:   Focus of session was NMR for increased trunk control, weight shifting in sitting and standing with use of mirror for visual feedback.  Transfers from w/c to mat table and vice versa at mod assist with mod to max cues for hand placement/foot placement, remaining low and keeping weight shifted forward during controlled transfer.  At edge of mat, performed reciprocal scooting with focus on controlled movement and trunk shortening/lengthening.  Also performed several reps of weight shifting to R and L hip with focus on trunk shortening and strengthening during card placement under buttocks.  Requires intermittent min/guard for activity for mild LOB.  Performed seated trunk extension/flex with focus on slow controlled and even movements between R and L side.  Requires assist to maintain BLEs in place on floor.  Performed 5 sit to stands with cues for continued forward flexion when sitting standing and increased hip ext in standing.  Once in standing on last rep performed R and L weight shifts with mirror for increased visual input.  Requires min to mod assist to maintain standing balance.   Therapy Documentation Precautions:  Precautions Precautions:  Fall Precaution Comments: increased extensor tone in L UE/LE, impulsive, ataxic Restrictions Weight Bearing Restrictions: No   Pain: Pain Assessment Pain Assessment: No/denies pain    See FIM for current functional status  Therapy/Group: Individual Therapy  [name] 06/14/2013, 12:51 PM

## 2013-06-14 NOTE — Progress Notes (Signed)
Occupational Therapy Note  Patient Details  Name: [name] MRN: [id number] Date of Birth: [birth year] Today's Date: 06/14/2013  Time:  1545 1650  (65 min) Pain:  None Individual session   Focus of treatment was bed mobility, transfers,  Neuro-muscular reeducation, sitting balance, standing balance, attention, therapeutic activities, sustained attention, postural control.  Pt. Lying in bed.  Went from supine to sit with mod assist.  Sat EOB and donned shoes with mod assist.  Transferred to wc with mod assist and one LOB.  Propelled wc about 75 feet with minimal cues for coordination and technique.  Practiced sit to stand and standing balance x 10.  Pt. Stood unsupported for 14 seconds x 3.  Other times were 5-8 seconds.  Provided cues for pt to put weight on his toes and not on his heels.  Provided postural adjustments as pt tends to lean UB to left.  Taken back to room and transferred to bed with mod assist.  Need mod assist to get left leg in bed.         Humberto Seals 06/14/2013, 5:00 PM

## 2013-06-14 NOTE — Progress Notes (Signed)
Patient ID: [name], male   DOB: 05/26/1961, 52 y.o.   MRN: [id number] Subjective/Complaints: 52 y.o. 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 Patient states to be independent prior to admission living with his wife who works day shift. Admitted 06/08/2013 with right-sided weakness. MRI of the brain showed a 1.5 cm region of acute infarction affecting the white matter adjacent to posterior body of the left lateral ventricle with extension into the external capsule region as well as extensive ischemic changes throughout the brain. Carotid Dopplers with less than 40% ICA stenosis. Echocardiogram with ejection fraction of 70% grade 1 diastolic dysfunction. Urine drug screen was negative. Patient did not receive TPA. Neurology services consulted maintained on aspirin therapy for CVA prophylaxis as well as subcutaneous Lovenox for DVT prophylaxis  Sleeping soundly upon my arrival in room. No new problems today. A 12 point review of systems has been performed and if not noted above is otherwise negative.   Objective: Vital Signs: Blood pressure 130/84, pulse 92, temperature 98.5 F (36.9 C), temperature source Oral, resp. rate 19, height 5' 6.14" (1.68 m), weight 63.5 kg (139 lb 15.9 oz), SpO2 98.00%. No results found. Results for orders placed during the hospital encounter of 06/11/13 (from the past 72 hour(s))  CBC     Status: Abnormal   Collection Time    06/11/13  7:37 PM      Result Value Range   WBC 3.9 (*) 4.0 - 10.5 K/uL   RBC 5.66  4.22 - 5.81 MIL/uL   Hemoglobin 13.6  13.0 - 17.0 g/dL   HCT 78.2  95.6 - 21.3 %   MCV 70.0 (*) 78.0 - 100.0 fL   MCH 24.0 (*) 26.0 - 34.0 pg   MCHC 34.3  30.0 - 36.0 g/dL   RDW 08.6  57.8 - 46.9 %   Platelets 204  150 - 400 K/uL  CREATININE, SERUM     Status: Abnormal   Collection Time    06/11/13  7:37 PM      Result Value Range   Creatinine, Ser 1.35  0.50 - 1.35 mg/dL   GFR calc non Af Amer 59 (*) >90 mL/min   GFR calc Af Amer 69 (*) >90 mL/min   Comment:            The eGFR has been calculated     using the CKD EPI equation.     This calculation has not been     validated in all clinical     situations.     eGFR's persistently     <90 mL/min signify     possible Chronic Kidney Disease.  CBC WITH DIFFERENTIAL     Status: Abnormal   Collection Time    06/12/13  6:30 AM      Result Value Range   WBC 3.7 (*) 4.0 - 10.5 K/uL   RBC 5.53  4.22 - 5.81 MIL/uL   Hemoglobin 13.0  13.0 - 17.0 g/dL   HCT 62.9 (*) 52.8 - 41.3 %   MCV 70.3 (*) 78.0 - 100.0 fL   MCH 23.5 (*) 26.0 - 34.0 pg   MCHC 33.4  30.0 - 36.0 g/dL   RDW 24.4  01.0 - 27.2 %   Platelets 192  150 - 400 K/uL   Neutrophils Relative % 46  43 - 77 %   Neutro Abs 1.7  1.7 - 7.7  K/uL   Lymphocytes Relative 35  12 - 46 %   Lymphs Abs 1.3  0.7 - 4.0 K/uL   Monocytes Relative 12  3 - 12 %   Monocytes Absolute 0.4  0.1 - 1.0 K/uL   Eosinophils Relative 7 (*) 0 - 5 %   Eosinophils Absolute 0.3  0.0 - 0.7 K/uL   Basophils Relative 0  0 - 1 %   Basophils Absolute 0.0  0.0 - 0.1 K/uL  COMPREHENSIVE METABOLIC PANEL     Status: Abnormal   Collection Time    06/12/13  6:30 AM      Result Value Range   Sodium 135  135 - 145 mEq/L   Potassium 3.7  3.5 - 5.1 mEq/L   Chloride 101  96 - 112 mEq/L   CO2 25  19 - 32 mEq/L   Glucose, Bld 99  70 - 99 mg/dL   BUN 11  6 - 23 mg/dL   Creatinine, Ser 4.09  0.50 - 1.35 mg/dL   Calcium 9.3  8.4 - 81.1 mg/dL   Total Protein 7.2  6.0 - 8.3 g/dL   Albumin 3.5  3.5 - 5.2 g/dL   AST 74 (*) 0 - 37 U/L   ALT 88 (*) 0 - 53 U/L   Alkaline Phosphatase 87  39 - 117 U/L   Total Bilirubin 0.7  0.3 - 1.2 mg/dL   GFR calc non Af Amer 65 (*) >90 mL/min   GFR calc Af Amer 75 (*) >90 mL/min   Comment:            The eGFR has been calculated     using the CKD EPI equation.     This calculation has not been     validated in all  clinical     situations.     eGFR's persistently     <90 mL/min signify     possible Chronic Kidney Disease.     HEENT: normal Cardio: tachy and no murmurs Resp: CTA B/L and unlabored GI: BS positive and non tender Extremity:  Pulses positive and No Edema Skin:   Intact Neuro: Alert/Oriented, Cranial Nerve II-XII normal, Abnormal Sensory decreased LUE LT, Abnormal Motor 3-/5 L delt,bi,tri,grip,3+ HF,KE ADF,4/5 on R side  Abnormal FMC Ataxic/ dec FMC and Tone  Hypertonic on L side 2/4, Tone:  normal tone on R side and Dysarthric Musc/Skel:  Other R shoulder pain with abduction Gen NAD   Assessment/Plan: 1. 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 Physiatrist is providing close team supervision and 24 hour management of active medical problems listed below. Physiatrist and rehab team continue to assess barriers to discharge/monitor patient progress toward functional and medical goals. FIM: FIM - Bathing Bathing Steps Patient Completed: Chest;Right Arm;Left Arm;Abdomen;Right upper leg;Left upper leg Bathing: 3: Mod-Patient completes 5-7 70f 10 parts or 50-74%  FIM - Upper Body Dressing/Undressing Upper body dressing/undressing steps patient completed: Thread/unthread right sleeve of pullover shirt/dresss;Thread/unthread left sleeve of pullover shirt/dress;Put head through opening of pull over shirt/dress;Pull shirt over trunk Upper body dressing/undressing: 5: Supervision: Safety issues/verbal cues FIM - Lower Body Dressing/Undressing Lower body dressing/undressing steps patient completed: Thread/unthread right underwear leg;Thread/unthread left underwear leg;Thread/unthread right pants leg;Thread/unthread left pants leg;Don/Doff right shoe;Don/Doff left shoe Lower body dressing/undressing: 3: Mod-Patient completed 50-74%  of tasks  FIM - Toileting Toileting steps completed by  patient: Performs perineal hygiene Toileting: 2: Max-Patient completed 1 of 3 steps  FIM - Diplomatic Services operational officer Devices: Elevated toilet seat;Grab bars Toilet Transfers: 3-To toilet/BSC: Mod A (lift or lower assist);3-From toilet/BSC: Mod A (lift or lower assist)  FIM - Bed/Chair Transfer Bed/Chair Transfer: 4: Bed > Chair or W/C: Min A (steadying Pt. > 75%);3: Chair or W/C > Bed: Mod A (lift or lower assist) (squat pivot)  FIM - Locomotion: Wheelchair Distance: 25' Locomotion: Wheelchair: 1: Total Assistance/staff pushes wheelchair (Pt<25%) FIM - Locomotion: Ambulation Locomotion: Ambulation Assistive Devices:  (2 person HHA) Ambulation/Gait Assistance: 1: +2 Total assist Locomotion: Ambulation: 0: Activity did not occur  Comprehension Comprehension Mode: Auditory Comprehension: 5-Follows basic conversation/direction: With no assist  Expression Expression Mode: Verbal Expression: 6-Expresses complex ideas: With extra time/assistive device  Social Interaction Social Interaction: 5-Interacts appropriately 90% of the time - Needs monitoring or encouragement for participation or interaction.  Problem Solving Problem Solving: 5-Solves basic problems: With no assist  Memory Memory: 5-Recognizes or recalls 90% of the time/requires cueing < 10% of the time  Medical Problem List and Plan:  1. Thrombotic left periventricular,EC infarct  2. DVT Prophylaxis/Anticoagulation: Subcutaneous Lovenox. Monitor platelet counts and any signs of bleeding  3. Pain Management: Tylenol as needed  4. Neuropsych: This patient is capable of making decisions on his own behalf.  5. Hypertension. Presently on Norvasc 10 mg daily. Monitor with increased mobility. Patient also on Tenormin 50 mg daily, hydrochlorothiazide 25 mg daily and lisinopril 40 mg daily prior to admission. --resumed hctz yesterday with some improvement  seen  -continue to follow and adjust as needed 6. 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 Latest urine drug screen negative  7. Hyperlipidemia. Zocor  8. 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  -low dose baclofen trial, ROM, splinting with therapy  LOS (Days) 3 A FACE TO FACE EVALUATION WAS PERFORMED  Casandra Dallaire T 06/14/2013, 9:14 AM

## 2013-06-14 NOTE — Progress Notes (Addendum)
Physical Therapy Session Note  Patient Details  Name: [name] MRN: [id number] Date of Birth: [birth year]  Today's Date: 06/14/2013 Time: 0207-0247 Time Calculation (min): 40 min  Short Term Goals: Week 1:  PT Short Term Goal 1 (Week 1): Pt will perform stand pivot transfer at mod assist and min cuing for technique.  PT Short Term Goal 2 (Week 1): Pt will perform dynamic sitting balance at min assist level PT Short Term Goal 3 (Week 1): Pt will ambulate x 4' with LRAD at max assist PT Short Term Goal 4 (Week 1): Pt will correct R lateral leaning in sitting/standing with min assist and min cues.  PT Short Term Goal 5 (Week 1): Pt will perform dynamic standing balance activities at mod assist.   Skilled Therapeutic Interventions/Progress Updates:  Tx focused on transfer training, NMR with dynamic standing balance, and gait with +2 HHA.  WC>Mat with  Mod A for lifting and steadying to turn, Mat>WC with  Min A for steadying. Sit<>stand with Min A to compensate for posterior lean. Dynamic standing balance, reaching for rings outside BOS in all directions with R UE x 10 and LUE x2, challenging pt to come over toes due to persistent posterior lean.  Gait with +2 HHA, switching from L HHA to L arm over therapist shoulders, R HHA with pt cued to push down. PT has quite ataxic gait with decreased L weight-shift, decreased accuracy with L step, R step crossed toward midline, and pt tends to lean to R. Pt did walk ~215'  in this manner with cues for upright posture, weight shifting, and equal step length.  Sit>supine with S       Therapy Documentation Precautions:  Precautions Precautions: Fall Precaution Comments: increased extensor tone in L UE/LE, impulsive, ataxic Restrictions Weight Bearing Restrictions: No Pain: None   Locomotion : Ambulation Ambulation/Gait Assistance: 1: +2 Total assist   See FIM for current functional status  Therapy/Group: Individual Therapy  Clydene Laming,  PT, DPT 06/14/2013, 3:23 PM

## 2013-06-14 NOTE — Progress Notes (Signed)
Occupational Therapy Session Note  Patient Details  Name: [name] MRN: [id number] Date of Birth: 1961-05-06  Today's Date: 06/14/2013 Time: 0900-1000 Time Calculation (min): 60 min  Short Term Goals: Week 1:  OT Short Term Goal 1 (Week 1): Pt will maintain dynamic sitting balance EOC or bed during bathing with min assist. OT Short Term Goal 2 (Week 1): Pt will perform sit to stand with mod assist during LB selfcare. OT Short Term Goal 3 (Week 1): Pt will perform toilet transfer with mod assist stand pivot. OT Short Term Goal 4 (Week 1): Pt will perform LB bathing sit to stand with mod assist and AE PRN.  Skilled Therapeutic Interventions/Progress Updates:    Pt performed bathing and dressing this session.  Pt transferred to wheelchair and to the tub seat in the shower with mod assist.  He was able to perform all bathing with min assist.  Still with decreased ability to reach his feet efficiently or his left glute.  May benefit from [facility] sponge to assist with this.  Still needs mod assist for standing balance but approaching min assist level.  Increased tone in the LUE but uses functionally to assist with bathing and to stabilize himself in standing.  Performed dressing sit to stand at the sink with min assist this session.  Therapy Documentation Precautions:  Precautions Precautions: Fall Precaution Comments: increased extensor tone in L UE/LE, impulsive, ataxic Restrictions Weight Bearing Restrictions: No  Pain: Pain Assessment Pain Assessment: No/denies pain ADL: See FIM for current functional status  Therapy/Group: Individual Therapy  Ema Hebner OTR/L 06/14/2013, 10:14 AM

## 2013-06-15 ENCOUNTER — Inpatient Hospital Stay (HOSPITAL_COMMUNITY): Payer: Medicare Other | Admitting: Occupational Therapy

## 2013-06-15 ENCOUNTER — Inpatient Hospital Stay (HOSPITAL_COMMUNITY): Payer: Medicare Other | Admitting: Physical Therapy

## 2013-06-15 ENCOUNTER — Inpatient Hospital Stay (HOSPITAL_COMMUNITY): Payer: Medicare Other | Admitting: *Deleted

## 2013-06-15 DIAGNOSIS — M1990 Unspecified osteoarthritis, unspecified site: Secondary | ICD-10-CM

## 2013-06-15 DIAGNOSIS — I6350 Cerebral infarction due to unspecified occlusion or stenosis of unspecified cerebral artery: Secondary | ICD-10-CM

## 2013-06-15 DIAGNOSIS — I10 Essential (primary) hypertension: Secondary | ICD-10-CM

## 2013-06-15 MED ORDER — TRAMADOL HCL 50 MG PO TABS
50.0000 mg | ORAL_TABLET | Freq: Two times a day (BID) | ORAL | Status: DC | PRN
  Administered 2013-06-15 – 2013-06-24 (×6): 50 mg via ORAL
  Administered 2013-06-25: 100 mg via ORAL
  Filled 2013-06-15 (×2): qty 1
  Filled 2013-06-15: qty 2
  Filled 2013-06-15 (×5): qty 1

## 2013-06-15 NOTE — Progress Notes (Signed)
Occupational Therapy Note  Patient Details  Name: [name] MRN: [id number] Date of Birth: 1961/06/08 Today's Date: 06/15/2013  Time:  1430-1530  (60 min) Individual session Pain:  3/10 in back  Focus of treatment was bed mobility, transfers,neuro-muscular reeducation, sitting balance, standing balance, attention, therapeutic activities, sustained attention, postural control.   Pt transferred from bed to wc with minimal assist.  Transferred from wc to toilet with stand pivot technique and minimal assist.  Pt. Managed pants in standing with LUE support.  Stood to urinate with SBA to minimal assist.  Pt.stood at sink for 40 seconds without UE support; 2nd time was .8 seconds; 3rd time was 1 min 53 seconds; 4th time was 10 mins without UE support.  Pt reports he can feel his feet a lot more today than yesterday.    Humberto Seals 06/15/2013, 2:47 PM

## 2013-06-15 NOTE — Progress Notes (Signed)
Physical Therapy Note  Patient Details  Name: PARISH DUBOSE MRN: [id number] Date of Birth: 02-11-1961 Today's Date: 06/15/2013  1530-1625 (55 minutes) individual Pain: no reported pain Focus of treatment: Therapeutic exercise focused on bilateral LE strengthening/control/ activity tolerance; active stretches (trunk rotation); gait training Treatment: Pt up in wc upon arrival; transfer wc >< Nustep stand/turn mod/max assist ; Nustep level 5 LEs only X 10 minutes; supine trunk stretches using therapy ball with 30 sec holds; sit to stand from wc mod assist to EVA walker ; gait 30 feet X 2, 50 feet X 1 mod assist with mirror in front during gait for visual feedback secondary to decreased LE sensation ; stand to sit min assist with vcs to increase forward trunk lean.   Agamjot Kilgallon,JIM 06/15/2013, 4:29 PM

## 2013-06-15 NOTE — Progress Notes (Signed)
[name] is a 52 y.o. male 05/24/1961 [id number]  Subjective: C/o LBP and spasms. Feeling OK otherwise.  Objective: Vital signs in last 24 hours: Temp:  [97.6 F (36.4 C)-98.1 F (36.7 C)] 98.1 F (36.7 C) (08/02 0505) Pulse Rate:  [86-119] 86 (08/02 0505) Resp:  [20] 20 (08/02 0505) BP: (125-132)/(94-95) 125/95 mmHg (08/02 0505) SpO2:  [98 %-99 %] 99 % (08/02 0505) Weight change:  Last BM Date: 06/14/13  Intake/Output from previous day: 08/01 0701 - 08/02 0700 In: 480 [P.O.:480] Out: 525 [Urine:525] Last cbgs: CBG (last 3)  No results found for this basename: GLUCAP,  in the last 72 hours   Physical Exam General: No apparent distress    HEENT: moist mucosa Lungs: Normal effort. Lungs clear to auscultation, no crackles or wheezes. Cardiovascular: Regular rate and rhythm, no edema Musculoskeletal:  No change from before. LS is tender Neurological: No new neurological deficits Wounds: N/A    Skin: clear Alert, cooperative   Lab Results: BMET    Component Value Date/Time   NA 135 06/12/2013 0630   K 3.7 06/12/2013 0630   CL 101 06/12/2013 0630   CO2 25 06/12/2013 0630   GLUCOSE 99 06/12/2013 0630   BUN 11 06/12/2013 0630   CREATININE 1.25 06/12/2013 0630   CREATININE 1.37* 04/05/2013 1038   CALCIUM 9.3 06/12/2013 0630   GFRNONAA 65* 06/12/2013 0630   GFRAA 75* 06/12/2013 0630   CBC    Component Value Date/Time   WBC 3.7* 06/12/2013 0630   RBC 5.53 06/12/2013 0630   HGB 13.0 06/12/2013 0630   HCT 38.9* 06/12/2013 0630   PLT 192 06/12/2013 0630   MCV 70.3* 06/12/2013 0630   MCH 23.5* 06/12/2013 0630   MCHC 33.4 06/12/2013 0630   RDW 14.7 06/12/2013 0630   LYMPHSABS 1.3 06/12/2013 0630   MONOABS 0.4 06/12/2013 0630   EOSABS 0.3 06/12/2013 0630   BASOSABS 0.0 06/12/2013 0630    Studies/Results: No results found.  Medications: I have reviewed the patient's current medications.  Assessment/Plan:   1. Thrombotic left periventricular,EC infarct  2. DVT  Prophylaxis/Anticoagulation: Subcutaneous Lovenox. Monitor platelet counts and any signs of bleeding  3. Pain Management: Tylenol as needed. Tramadol prn for LBP 4. Neuropsych: This patient is capable of making decisions on his own behalf.  5. Hypertension. Presently on Norvasc 10 mg daily. Monitor with increased mobility. Patient also on Tenormin 50 mg daily, hydrochlorothiazide 25 mg daily and lisinopril 40 mg daily prior to admission. --resumed hctz yesterday with some improvement seen  -continue to follow and adjust as needed  6. 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 Latest urine drug screen negative  7. Hyperlipidemia. Zocor  8. 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  -low dose baclofen trial, ROM, splinting with therapy    Length of stay, days: 4  Sonda Primes , MD 06/15/2013, 9:24 AM

## 2013-06-15 NOTE — Progress Notes (Signed)
Physical Therapy Session Note  Patient Details  Name: [name] MRN: [id number] Date of Birth: [birth year]  Today's Date: 06/15/2013 Time: 1000-1030 Time Calculation (min): 30 min  Short Term Goals: Week 1:  PT Short Term Goal 1 (Week 1): Pt will perform stand pivot transfer at mod assist and min cuing for technique.  PT Short Term Goal 2 (Week 1): Pt will perform dynamic sitting balance at min assist level PT Short Term Goal 3 (Week 1): Pt will ambulate x 70' with LRAD at max assist PT Short Term Goal 4 (Week 1): Pt will correct R lateral leaning in sitting/standing with min assist and min cues.  PT Short Term Goal 5 (Week 1): Pt will perform dynamic standing balance activities at mod assist.   Therapy Documentation Precautions:  Precautions: Fall Precaution Comments: increased extensor tone in L UE/LE, impulsive, ataxic Restrictions Weight Bearing Restrictions: No Vital Signs: Therapy Vitals BP: 138/99 mmHg Pain: denies pain  Therapeutic Exercise:(30') Nu-Step x 25' Level 4 using B UE/LE's with 3 rest breaks.  Therapy/Group: Individual Therapy  Hezzie Karim J 06/15/2013, 10:03 AM

## 2013-06-15 NOTE — Progress Notes (Signed)
Occupational Therapy Session Note  Patient Details  Name: [name] MRN: [id number] Date of Birth: 07-27-1961  Today's Date: 06/15/2013 Time: 1478-2956 Time Calculation (min): 45 min  Skilled Therapeutic Interventions/Progress Updates: ADL in w/c at sink with focus on slower, controlled movements as much, bilateral UE use and dynamic standing balance in order to safely and with good balance complete self care and transfers.  Patient aware of his tone issues; tended to lean left in his w/c but when prompted to look at posture in mirror was able to self correct and maintain until he stood up and sat down again.    Patient with therapeutic sens of humor during today's session.     Therapy Documentation Precautions:  Precautions Precautions: Fall Precaution Comments: increased extensor tone in L UE/LE, impulsive, ataxic Restrictions Weight Bearing Restrictions: No Pain: Pain Assessment Pain Assessment: No/denies pain  See FIM for current functional status  Therapy/Group: Individual Therapy  Bud Face [facility] 06/15/2013, 12:38 PM

## 2013-06-16 ENCOUNTER — Inpatient Hospital Stay (HOSPITAL_COMMUNITY): Payer: Medicare Other | Admitting: Occupational Therapy

## 2013-06-16 DIAGNOSIS — G243 Spasmodic torticollis: Secondary | ICD-10-CM

## 2013-06-16 MED ORDER — FLUTICASONE PROPIONATE 50 MCG/ACT NA SUSP
2.0000 | Freq: Every day | NASAL | Status: DC
  Administered 2013-06-16 – 2013-06-20 (×5): 2 via NASAL
  Filled 2013-06-16: qty 16

## 2013-06-16 NOTE — Progress Notes (Signed)
Occupational Therapy Session Note  Patient Details  Name: [name] MRN: [id number] Date of Birth: 1961-06-22  Today's Date: 06/16/2013 Time: 9629-5284 Time Calculation (min): 76 min  Skilled Therapeutic Interventions/Progress Updates: Upon approach for therapy patient eating.   Assisted patient with tray setup.   Then continued with OT services via transfers and self care to address slowing down to plan, control and process movements.   Patient with initial balance issues sitting of EOB but able to right himself.  He stated he has to initiate movements with "strong force" or that he will not have the power to move.  He stated that he will not lose his balance by doing so.  Also addressed deficit in self care and transfers given patient's tone challenges .  He required approximately moderate assistance to bathe and dress legs and feet due to high tone in right hip flexors and bilateral LEs.  Though he required assistance to break tone in right hip flexor to keep right leg crossed with help, he was able to cross left leg over right in order to bathe/dress left lower leg and feet.    Patient required min A for dynamic standing balance to wash periarea today.       Patient stated with the help of "Ambien" he slept better last night than prior but may have had a little 'drag' this morning as a result of having taken the sleep aide.      Therapy Documentation Precautions:  Precautions Precautions: Fall Precaution Comments: increased extensor tone in L UE/LE, impulsive, ataxic Restrictions Weight Bearing Restrictions: No  Pain:denied   See FIM for current functional status  Therapy/Group: Individual Therapy  Bud Face [facility] 06/16/2013, 3:24 PM

## 2013-06-16 NOTE — Progress Notes (Signed)
[name] is a 52 y.o. male 18-May-1961 [id number]  Subjective: C/o LBP and spasms-better. Feeling OK otherwise. No HA  Objective: Vital signs in last 24 hours: Temp:  [97.7 F (36.5 C)-98.1 F (36.7 C)] 97.7 F (36.5 C) (08/03 0512) Pulse Rate:  [76-87] 76 (08/03 0512) Resp:  [18] 18 (08/03 0512) BP: (122-143)/(86-99) 143/96 mmHg (08/03 0512) SpO2:  [97 %-100 %] 97 % (08/03 0512) Weight change:  Last BM Date: 06/14/13  Intake/Output from previous day: 08/02 0701 - 08/03 0700 In: 1040 [P.O.:1040] Out: 650 [Urine:650] Last cbgs: CBG (last 3)  No results found for this basename: GLUCAP,  in the last 72 hours   Physical Exam General: No apparent distress    HEENT: moist mucosa Lungs: Normal effort. Lungs clear to auscultation, no crackles or wheezes. Cardiovascular: Regular rate and rhythm, no edema Musculoskeletal:  No change from before. LS is tender Neurological: No new neurological deficits Wounds: N/A    Skin: clear Alert, cooperative   Lab Results: BMET    Component Value Date/Time   NA 135 06/12/2013 0630   K 3.7 06/12/2013 0630   CL 101 06/12/2013 0630   CO2 25 06/12/2013 0630   GLUCOSE 99 06/12/2013 0630   BUN 11 06/12/2013 0630   CREATININE 1.25 06/12/2013 0630   CREATININE 1.37* 04/05/2013 1038   CALCIUM 9.3 06/12/2013 0630   GFRNONAA 65* 06/12/2013 0630   GFRAA 75* 06/12/2013 0630   CBC    Component Value Date/Time   WBC 3.7* 06/12/2013 0630   RBC 5.53 06/12/2013 0630   HGB 13.0 06/12/2013 0630   HCT 38.9* 06/12/2013 0630   PLT 192 06/12/2013 0630   MCV 70.3* 06/12/2013 0630   MCH 23.5* 06/12/2013 0630   MCHC 33.4 06/12/2013 0630   RDW 14.7 06/12/2013 0630   LYMPHSABS 1.3 06/12/2013 0630   MONOABS 0.4 06/12/2013 0630   EOSABS 0.3 06/12/2013 0630   BASOSABS 0.0 06/12/2013 0630    Studies/Results: No results found.  Medications: I have reviewed the patient's current medications.  Assessment/Plan:   1. Thrombotic left periventricular,EC infarct  2.  DVT Prophylaxis/Anticoagulation: Subcutaneous Lovenox. Monitor platelet counts and any signs of bleeding  3. Pain Management: Tylenol as needed. Tramadol prn for LBP 4. Neuropsych: This patient is capable of making decisions on his own behalf.  5. Hypertension. Presently on Norvasc 10 mg daily. Monitor with increased mobility. Patient also on Tenormin 50 mg daily, hydrochlorothiazide 25 mg daily and lisinopril 40 mg daily prior to admission. --resumed hctz yesterday with some improvement seen  -continue to follow and adjust as needed  6. 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 Latest urine drug screen negative  7. Hyperlipidemia. Zocor  8. 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  -low dose baclofen trial, ROM, splinting with therapy    Length of stay, days: 5  Sonda Primes , MD 06/16/2013, 8:44 AM

## 2013-06-17 ENCOUNTER — Inpatient Hospital Stay (HOSPITAL_COMMUNITY): Payer: Medicare Other | Admitting: Occupational Therapy

## 2013-06-17 ENCOUNTER — Inpatient Hospital Stay (HOSPITAL_COMMUNITY): Payer: Medicare Other | Admitting: Rehabilitation

## 2013-06-17 MED ORDER — ALUM & MAG HYDROXIDE-SIMETH 200-200-20 MG/5ML PO SUSP
15.0000 mL | Freq: Four times a day (QID) | ORAL | Status: DC | PRN
  Administered 2013-06-17: 15 mL via ORAL
  Filled 2013-06-17: qty 30

## 2013-06-17 MED ORDER — BACLOFEN 10 MG PO TABS
10.0000 mg | ORAL_TABLET | Freq: Three times a day (TID) | ORAL | Status: DC
  Administered 2013-06-17 – 2013-06-20 (×11): 10 mg via ORAL
  Filled 2013-06-17 (×15): qty 1

## 2013-06-17 NOTE — Progress Notes (Signed)
Patient ID: [name], male   DOB: 15-May-1961, 52 y.o.   MRN: [id number] Subjective/Complaints: 52 y.o. 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 Patient states to be independent prior to admission living with his wife who works day shift. Admitted 06/08/2013 with right-sided weakness. MRI of the brain showed a 1.5 cm region of acute infarction affecting the white matter adjacent to posterior body of the left lateral ventricle with extension into the external capsule region as well as extensive ischemic changes throughout the brain. Carotid Dopplers with less than 40% ICA stenosis. Echocardiogram with ejection fraction of 70% grade 1 diastolic dysfunction. Urine drug screen was negative. Patient did not receive TPA. Neurology services consulted maintained on aspirin therapy for CVA prophylaxis as well as subcutaneous Lovenox for DVT prophylaxis  Dexterity improving with LUE. Improved ROM throughout the left side (can move left ankle which he wasn't doing prior to coming here) A 12 point review of systems has been performed and if not noted above is otherwise negative.   Objective: Vital Signs: Blood pressure 131/86, pulse 91, temperature 97.9 F (36.6 C), temperature source Oral, resp. rate 18, height 5' 6.14" (1.68 m), weight 63.5 kg (139 lb 15.9 oz), SpO2 95.00%. No results found. No results found for this or any previous visit (from the past 72 hour(s)).   HEENT: normal Cardio: tachy and no murmurs Resp: CTA B/L and unlabored GI: BS positive and non tender Extremity:  Pulses positive and No Edema Skin:   Intact Neuro: Alert/Oriented, Cranial Nerve II-XII normal, Abnormal Sensory decreased LUE/LLE LT (distal more so than prox), Abnormal Motor 3-/5 L delt,bi,tri,grip,3+ HF,KE ADF,4/5 on R side  Abnormal FMC Ataxic/ dec FMC and Tone  Hypertonic on L side 1-2/4, Tone:  normal tone on R  side and Dysarthric Musc/Skel:  Other R shoulder pain with abduction Gen NAD   Assessment/Plan: 1. 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 Physiatrist is providing close team supervision and 24 hour management of active medical problems listed below. Physiatrist and rehab team continue to assess barriers to discharge/monitor patient progress toward functional and medical goals. FIM: FIM - Bathing Bathing Steps Patient Completed: Chest;Right Arm;Left Arm;Abdomen;Front perineal area;Right upper leg;Left upper leg;Left lower leg (including foot) Bathing: 4: Min-Patient completes 8-9 30f 10 parts or 75+ percent  FIM - Upper Body Dressing/Undressing Upper body dressing/undressing steps patient completed: Thread/unthread left sleeve of pullover shirt/dress;Put head through opening of pull over shirt/dress;Pull shirt over trunk;Thread/unthread right sleeve of pullover shirt/dresss Upper body dressing/undressing: 5: Supervision: Safety issues/verbal cues FIM - Lower Body Dressing/Undressing Lower body dressing/undressing steps patient completed: Thread/unthread left underwear leg;Thread/unthread right underwear leg;Thread/unthread left pants leg;Thread/unthread right pants leg;Don/Doff left sock Lower body dressing/undressing: 3: Mod-Patient completed 50-74% of tasks  FIM - Toileting Toileting steps completed by patient: Performs perineal hygiene Toileting: 0: Activity did not occur  FIM - Diplomatic Services operational officer Devices: Elevated toilet seat;Grab bars Toilet Transfers: 0-Activity did not occur  FIM - Banker Devices: Arm rests Bed/Chair Transfer: 0: Activity did not occur  FIM - Locomotion: Wheelchair Distance: 25' Locomotion: Wheelchair: 1: Two helpers FIM -  Locomotion: Ambulation Locomotion: Health visitor Devices: Other (comment) (2 person HHA) Ambulation/Gait Assistance: 1: +2 Total assist Locomotion: Ambulation: 0: Activity did  not occur  Comprehension Comprehension Mode: Auditory Comprehension: 5-Understands basic 90% of the time/requires cueing < 10% of the time  Expression Expression Mode: Verbal Expression: 5-Expresses basic needs/ideas: With extra time/assistive device  Social Interaction Social Interaction: 5-Interacts appropriately 90% of the time - Needs monitoring or encouragement for participation or interaction.  Problem Solving Problem Solving: 5-Solves basic 90% of the time/requires cueing < 10% of the time  Memory Memory: 5-Recognizes or recalls 90% of the time/requires cueing < 10% of the time  Medical Problem List and Plan:  1. Thrombotic left periventricular,EC infarct  2. DVT Prophylaxis/Anticoagulation: Subcutaneous Lovenox. Monitor platelet counts and any signs of bleeding  3. Pain Management: Tylenol as needed  4. Neuropsych: This patient is capable of making decisions on his own behalf.  5. Hypertension. Presently on Norvasc 10 mg daily. Monitor with increased mobility. Patient also on Tenormin 50 mg daily, hydrochlorothiazide 25 mg daily and lisinopril 40 mg daily prior to admission. --resumed hctz yesterday with some improvement seen  -continue to follow and adjust as needed 6. 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 Latest urine drug screen negative  7. Hyperlipidemia. Zocor  8. 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  -low dose baclofen trial- will titrate further as pt seeing improvement in chronic hypertonicity on the left   -continue ROM, splinting with therapy  LOS (Days) 6 A FACE TO FACE EVALUATION WAS PERFORMED  Surya Folden T 06/17/2013, 9:05 AM

## 2013-06-17 NOTE — Progress Notes (Addendum)
Physical Therapy Session Note  Patient Details  Name: [name] MRN: [id number] Date of Birth: September 05, 1961  Today's Date: 06/17/2013 Time: 8295-6213 Time Calculation (min): 55 min  Short Term Goals: Week 1:  PT Short Term Goal 1 (Week 1): Pt will perform stand pivot transfer at mod assist and min cuing for technique.  PT Short Term Goal 2 (Week 1): Pt will perform dynamic sitting balance at min assist level PT Short Term Goal 3 (Week 1): Pt will ambulate x 18' with LRAD at max assist PT Short Term Goal 4 (Week 1): Pt will correct R lateral leaning in sitting/standing with min assist and min cues.  PT Short Term Goal 5 (Week 1): Pt will perform dynamic standing balance activities at mod assist.   Skilled Therapeutic Interventions/Progress Updates:   Focus of session was NMR/PNF techniques for decreased toned and slow controlled muscle facilitation in R and L LE and gait training.  Performed PNF D1 flex/ext patterns first as PROM, then AAROM, then resisted only in flex then resisted both in flex and ext.  Performed each x 10 reps on BLE.  Cues for continued breathing as pt tends to hold his breath.  Pt clasped hands together to prevent full flex tone in LUE.  Performed pelvic/trunk rotation with LEs on physioball x 10 reps each direction.  Had pt scoot around mat to prepare for gait training with cues for forward leaning, foot placement and safety.  Ambulated x 119' x 2 (seated rest break in between) with [name] walker (+2 assist for steering Eva walker) with manual facilitation of L weight shift and max cues for upright posture, weight shifting for improved foot/leg advancement and increased step width.    Therapy Documentation Precautions:  Precautions Precautions: Fall Precaution Comments: increased extensor tone in L UE/LE, impulsive, ataxic Restrictions Weight Bearing Restrictions: No   Pain:Pt states no pain during this session.      See FIM for current functional  status  Therapy/Group: Individual Therapy  [name] 06/17/2013, 9:28 AM

## 2013-06-17 NOTE — Progress Notes (Signed)
Physical Therapy Session Note  Patient Details  Name: [name] MRN: [id number] Date of Birth: June 01, 1961  Today's Date: 06/17/2013 Time: 1400-1447 Time Calculation (min): 47 min  Short Term Goals: Week 1:  PT Short Term Goal 1 (Week 1): Pt will perform stand pivot transfer at mod assist and min cuing for technique.  PT Short Term Goal 2 (Week 1): Pt will perform dynamic sitting balance at min assist level PT Short Term Goal 3 (Week 1): Pt will ambulate x 50' with LRAD at max assist PT Short Term Goal 4 (Week 1): Pt will correct R lateral leaning in sitting/standing with min assist and min cues.  PT Short Term Goal 5 (Week 1): Pt will perform dynamic standing balance activities at mod assist.   Skilled Therapeutic Interventions/Progress Updates:   Pt received in room eating lunch.  He states that he feels very tired and doesn't even feel like eating.  PT prepared to perform session in room, therefore went to gym to retreive items, however upon return and preparing for transfer back to bed, pt states he feels somewhat dizzy when scooting.  BP was 153/94.  Pt sat for several minutes and spoke about his frustration with being fatigued and that he felt like he was "wimping out."  Assured pt that he had done very well at beginning of day and PT would allow him to rest if he felt he needed to.  Assisted pt back to bed and performed 10 reps of supine bridging with assist to maintain B feet in contact with bed.  Pt and PT then discussed pts CVA diagnosis, what this meant, risk factors and signs and symptoms of stroke.  Pt verbalized understanding.  Pt left in bed with bed alarm set and call Rogel/phone in reach.   Therapy Documentation Precautions:  Precautions Precautions: Fall Precaution Comments: increased extensor tone in L UE/LE, impulsive, ataxic Restrictions Weight Bearing Restrictions: No   Pain: no pain, pt only states increased fatigue and some dizziness during scooting     See FIM  for current functional status  Therapy/Group: Individual Therapy  [name] 06/17/2013, 4:43 PM

## 2013-06-17 NOTE — Progress Notes (Signed)
Occupational Therapy Session Notes  Patient Details  Name: [name] MRN: [id number] Date of Birth: 1961/03/11  Today's Date: 06/17/2013 Time: 1030-1130 and 100-130 Time Calculation (min): 60 min and 30 min  Short Term Goals: Week 1:  OT Short Term Goal 1 (Week 1): Pt will maintain dynamic sitting balance EOC or bed during bathing with min assist. OT Short Term Goal 2 (Week 1): Pt will perform sit to stand with mod assist during LB selfcare. OT Short Term Goal 3 (Week 1): Pt will perform toilet transfer with mod assist stand pivot. OT Short Term Goal 4 (Week 1): Pt will perform LB bathing sit to stand with mod assist and AE PRN.  Skilled Therapeutic Interventions/Progress Updates:  1)  Patient sleeping in bed upon arrival.  Pt seen for ADL retraining to include grooming, bathing and dressing.  Focused session on Endurance, Safety and Sensory , decreased sitting balance, decreased standing balance, decreased postural control, hemiplegia and decreased balance strategies, impaired timing and sequencing, abnormal tone, unbalanced muscle activation, motor apraxia and decreased coordination, decreased visual perceptual skills, delayed processing.  Patient declined shower today stating,"I'm so tired, I don't know what is wrong with me, my speech is different and I got dizzy in PT earilier.  I feel different than I have the last few days."  Patient required multi modal cues for problem solving and safety.  Patient requested back to bed to rest/sleep even though he knew lunch would arrive soon.  All items within reach and bed alarm engaged.  2)  Patient sleeping in bed upon arrival and lunch had not been touched.  Patient again feeling groggy and having difficulty completing tasks and also reports that his speech is slurred.  After patient donned his shoes EOB, performed stand step transfer to w/c the stood at toilet to urinate.  Patient reports that his is having a difficult time getting the last of the  urine out therefore he stood for an extended period of time.  While standing, patient had difficulty managing his aim therefore, he worked on removing [employer] and shorts them donning dry ones.  Patient wanting to pull up on the sink instead of pushing from w/c and weight shifting forward.  Once he was guided he stood up with only min-mod assist.  Patient left in w/c with QRB on and all items in place.  Therapy Documentation Precautions:  Precautions Precautions: Fall Precaution Comments: increased extensor tone in L UE/LE, impulsive, ataxic Restrictions Weight Bearing Restrictions: No Pain: Denies pain for both sessions ADL: See FIM for current functional status  Therapy/Group: Individual Therapy both sessions  Zharia Conrow 06/17/2013, 12:28 PM

## 2013-06-18 ENCOUNTER — Inpatient Hospital Stay (HOSPITAL_COMMUNITY): Payer: Medicare Other | Admitting: Occupational Therapy

## 2013-06-18 ENCOUNTER — Inpatient Hospital Stay (HOSPITAL_COMMUNITY): Payer: Medicare Other | Admitting: Rehabilitation

## 2013-06-18 LAB — BASIC METABOLIC PANEL
CO2: 28 mEq/L (ref 19–32)
Chloride: 98 mEq/L (ref 96–112)
GFR calc non Af Amer: 59 mL/min — ABNORMAL LOW (ref >90)
Glucose, Bld: 101 mg/dL — ABNORMAL HIGH (ref 70–99)
Potassium: 3.7 mEq/L (ref 3.5–5.1)
Sodium: 138 mEq/L (ref 135–145)

## 2013-06-18 MED ORDER — TRAZODONE HCL 50 MG PO TABS
100.0000 mg | ORAL_TABLET | Freq: Every evening | ORAL | Status: DC | PRN
  Administered 2013-06-19: 100 mg via ORAL
  Filled 2013-06-18: qty 2

## 2013-06-18 NOTE — Progress Notes (Signed)
Physical Therapy Session Note  Patient Details  Name: COTE MAYABB MRN: [id number] Date of Birth: 1961/06/25  Today's Date: 06/18/2013 Time: 4098-1191 Time Calculation (min): 43 min  Short Term Goals: Week 2:     Skilled Therapeutic Interventions/Progress Updates:   Focus of session was NMR for improved weight shifting, weight bearing and controlled R and L stepping.  Also worked on Investment banker, operational with Scientist, research (medical). Performed stepping to 3" step with manual facilitation for weight shifting and tapping for increased quad and glute activation when weight bearing and max cues for controlled stepping.  Utilized mirror in front of patient for visual feedback for posture, as he tends to lean to the right.  Gait training x approx 215' with Eva walker and +2 assist (+2 for steering walker) with verbal and manual cues for weight shift, upright posture, increasing WB through LEs vs UE's, and wider steps.    Therapy Documentation Precautions:  Precautions Precautions: Fall Precaution Comments: increased extensor tone in L UE/LE, impulsive, ataxic Restrictions Weight Bearing Restrictions: Yes   Pain: Pt with no c/o pain during session.    Locomotion : Ambulation Ambulation/Gait Assistance: 1: +2 Total assist   See FIM for current functional status  Therapy/Group: Individual Therapy  [name] 06/18/2013, 4:30 PM

## 2013-06-18 NOTE — Progress Notes (Signed)
Patient ID: [name], male   DOB: 29-Apr-1961, 52 y.o.   MRN: [id number] Subjective/Complaints: 52 y.o. 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 Patient states to be independent prior to admission living with his wife who works day shift. Admitted 06/08/2013 with right-sided weakness. MRI of the brain showed a 1.5 cm region of acute infarction affecting the white matter adjacent to posterior body of the left lateral ventricle with extension into the external capsule region as well as extensive ischemic changes throughout the brain. Carotid Dopplers with less than 40% ICA stenosis. Echocardiogram with ejection fraction of 70% grade 1 diastolic dysfunction. Urine drug screen was negative. Patient did not receive TPA. Neurology services consulted maintained on aspirin therapy for CVA prophylaxis as well as subcutaneous Lovenox for DVT prophylaxis  Didn't sleep as well. Couldn't relax. Didn't use ambien because he didn't like "hangover" A 12 point review of systems has been performed and if not noted above is otherwise negative.   Objective: Vital Signs: Blood pressure 139/98, pulse 93, temperature 97.9 F (36.6 C), temperature source Oral, resp. rate 19, height 5' 6.14" (1.68 m), weight 63.5 kg (139 lb 15.9 oz), SpO2 95.00%. No results found. Results for orders placed during the hospital encounter of 06/11/13 (from the past 72 hour(s))  BASIC METABOLIC PANEL     Status: Abnormal   Collection Time    06/18/13  6:00 AM      Result Value Range   Sodium 138  135 - 145 mEq/L   Potassium 3.7  3.5 - 5.1 mEq/L   Chloride 98  96 - 112 mEq/L   CO2 28  19 - 32 mEq/L   Glucose, Bld 101 (*) 70 - 99 mg/dL   BUN 15  6 - 23 mg/dL   Creatinine, Ser 4.09 (*) 0.50 - 1.35 mg/dL   Calcium 9.6  8.4 - 81.1 mg/dL   GFR calc non Af Amer 59 (*) >90 mL/min   GFR calc Af Amer 68 (*) >90 mL/min   Comment:             The eGFR has been calculated     using the CKD EPI equation.     This calculation has not been     validated in all clinical     situations.     eGFR's persistently     <90 mL/min signify     possible Chronic Kidney Disease.     HEENT: normal Cardio: tachy and no murmurs Resp: CTA B/L and unlabored GI: BS positive and non tender Extremity:  Pulses positive and No Edema Skin:   Intact Neuro: Alert/Oriented, Cranial Nerve II-XII normal, Abnormal Sensory decreased LUE/LLE LT (distal more so than prox), Abnormal Motor 3-/5 L delt,bi,tri,grip,3+ HF,KE ADF,4/5 on R side  Abnormal FMC Ataxic/ dec FMC and Tone  Hypertonic on L side 1-2/4, Tone:  normal tone on R side and Dysarthric Musc/Skel:  Other R shoulder pain with abduction Gen NAD   Assessment/Plan: 1. 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 Physiatrist is providing close team supervision and 24 hour management of active medical problems listed below. Physiatrist and rehab team continue to assess barriers to discharge/monitor patient progress toward functional and medical goals. FIM: FIM - Bathing Bathing Steps Patient Completed: Chest;Right Arm;Left  Arm;Abdomen;Front perineal area;Buttocks (declined to bathe LEs) Bathing: 4: Min-Patient completes 8-9 29f 10 parts or 75+ percent  FIM - Upper Body Dressing/Undressing Upper body dressing/undressing steps patient completed: Thread/unthread left sleeve of pullover shirt/dress;Put head through opening of pull over shirt/dress;Pull shirt over trunk;Thread/unthread right sleeve of pullover shirt/dresss Upper body dressing/undressing: 5: Set-up assist to: Obtain clothing/put away FIM - Lower Body Dressing/Undressing Lower body dressing/undressing steps patient completed: Thread/unthread left underwear  leg;Thread/unthread right underwear leg;Thread/unthread left pants leg;Thread/unthread right pants leg Lower body dressing/undressing: 2: Max-Patient completed 25-49% of tasks  FIM - Toileting Toileting steps completed by patient: Adjust clothing prior to toileting Toileting: 4: Steadying assist (stand to urinate)  FIM - Diplomatic Services operational officer Devices: Elevated toilet seat;Grab bars Toilet Transfers: 0-Activity did not occur  FIM - Banker Devices: Arm rests;Bed rails Bed/Chair Transfer: 3: Chair or W/C > Bed: Mod A (lift or lower assist);5: Sit > Supine: Supervision (verbal cues/safety issues)  FIM - Locomotion: Wheelchair Distance: 75 Locomotion: Wheelchair: 2: Travels 50 - 149 ft with supervision, cueing or coaxing FIM - Locomotion: Ambulation Locomotion: Ambulation Assistive Devices: Interior and spatial designer Ambulation/Gait Assistance: 1: +2 Total assist Locomotion: Ambulation: 1: Two helpers  Comprehension Comprehension Mode: Auditory Comprehension: 5-Understands basic 90% of the time/requires cueing < 10% of the time  Expression Expression Mode: Verbal Expression: 5-Expresses basic needs/ideas: With extra time/assistive device  Social Interaction Social Interaction: 5-Interacts appropriately 90% of the time - Needs monitoring or encouragement for participation or interaction.  Problem Solving Problem Solving: 5-Solves basic 90% of the time/requires cueing < 10% of the time  Memory Memory: 5-Recognizes or recalls 90% of the time/requires cueing < 10% of the time  Medical Problem List and Plan:  1. Thrombotic left periventricular,EC infarct  2. DVT Prophylaxis/Anticoagulation: Subcutaneous Lovenox. Monitor platelet counts and any signs of bleeding  3. Pain Management: Tylenol as needed  4. Neuropsych: This patient is capable of making decisions on his own behalf.  5. Hypertension. Presently on Norvasc 10 mg daily.  Monitor with increased mobility. Patient also on Tenormin 50 mg daily, hydrochlorothiazide 25 mg daily and lisinopril 40 mg daily prior to admission. --resumed hctz yesterday with some improvement seen  -continue to follow and adjust as needed 6. 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 Latest urine drug screen negative  7. Hyperlipidemia. Zocor  8. 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  -low dose baclofen trial- titrated to 10mg  tid   -continue ROM, splinting with therapy  LOS (Days) 7 A FACE TO FACE EVALUATION WAS PERFORMED  Srihith Aquilino T 06/18/2013, 8:36 AM

## 2013-06-18 NOTE — Progress Notes (Signed)
Physical Therapy Session Note  Patient Details  Name: Joshua SHAMOON MRN: [id number] Date of Birth: 01-16-1961  Today's Date: 06/18/2013 Time: 4098-1191 Time Calculation (min): 62 min  Short Term Goals: Week 1:  PT Short Term Goal 1 (Week 1): Pt will perform stand pivot transfer at mod assist and min cuing for technique.  PT Short Term Goal 2 (Week 1): Pt will perform dynamic sitting balance at min assist level PT Short Term Goal 3 (Week 1): Pt will ambulate x 54' with LRAD at max assist PT Short Term Goal 4 (Week 1): Pt will correct R lateral leaning in sitting/standing with min assist and min cues.  PT Short Term Goal 5 (Week 1): Pt will perform dynamic standing balance activities at mod assist.   Skilled Therapeutic Interventions/Progress Updates:   Pt received in w/c this morning.  States he would like to get shorts pulled up, therefore assisted in standing at mod assist and some assist for clothing management.  States he still feels tired, however wants to participate in therapy today.  Performed w/c mobility x 75' at S with use of R UE and B LEs with focus on slow controlled stepping and decreased trunk extension during activity.  Performed controlled sit to stands with small step under L LE initially for increased WB on RLE, however pt unable to fully balance once in standing, therefore practiced without step with pt demonstrating decreased ankle and hip strategy and continues to lose balance posteriorly.  Performed standing at // bars reaching for targets and placing them with focus on increased weight shifting R and L LE and also increased trunk rotation/flexion.  Pt with increased difficulty once asked to perform task without RUE support on bars.  Note that he would extend trunk without rotation.  Manual facilitation and verbal cues to improve motion and weight shift.   Therapy Documentation Precautions:  Precautions Precautions: Fall Precaution Comments: increased extensor tone in L  UE/LE, impulsive, ataxic Restrictions Weight Bearing Restrictions: No General:   Vital Signs: Therapy Vitals Temp: 97.9 F (36.6 C) Temp src: Oral Pulse Rate: 93 Resp: 19 BP: 139/98 mmHg Patient Position, if appropriate: Lying Oxygen Therapy SpO2: 95 % O2 Device: None (Room air) Pain: Pt with no c/o pain this morning.       Locomotion : Wheelchair Mobility Distance: 75   See FIM for current functional status  Therapy/Group: Individual Therapy  [name] 06/18/2013, 9:38 AM

## 2013-06-18 NOTE — Progress Notes (Signed)
Social Work Patient ID: [name], male   DOB: 1961-11-13, 52 y.o.   MRN: [id number] Spoke with wife via telephone to inform spoke with local social security office and they will only speak with pt or his wife.  But also informed this worker there is no partial disability, if He is receiving disability it is full disability.  Wife plans to follow up with this, gave her the local phone number and she plans to contact them.  She feels now he is more disabled than he was  Previously.  Continue to work on discharge plan.

## 2013-06-18 NOTE — Progress Notes (Signed)
Occupational Therapy Weekly Progress Note  And Therapy Session Notes  Patient Details  Name: [name] MRN: [id number] Date of Birth: July 15, 1961  Today's Date: 06/18/2013 Time: 1100-1200 and 1300-1330 Time Calculation (min): 60 min and 30 min  Patient has met 4 of 4 short term goals.  Prior to admission, patient had learned to compensate due to h/o hypertonia in RUE & RLE.  Unfortunely, patient has difficulty continuing to utilize the compensatory techniques he has relied on in the past due to decreased safety and increased fall risk.  Patient is having to learn new compensatory techniques and be willing to engage in neuromuscular re-education and motor relearning.  Patient has made improvements with recording period in the areas of BADL, balance and learning to control his movements which can sometimes be ballistic and appear to be impulsive.  Patient has had periods of significant weakness and fatigue-PA suspects from Baclofen.  Patient continues to demonstrate the following deficits: premorbid left sided hypertonia and joint tightness, dominant right sided hemiparesis, generalized weakness, impaired timing and sequencing, unbalanced muscle activation, ataxia and decreased motor planning.  Therefore, patient will continue to benefit from skilled OT intervention to enhance overall performance with BADL, iADL and Reduce care partner burden.  Patient progressing toward long term goals.  Continue plan of care.  OT Short Term Goals Week 1:  OT Short Term Goal 1 (Week 1): Pt will maintain dynamic sitting balance EOC or bed during bathing with min assist. OT Short Term Goal 1 - Progress (Week 1): Met OT Short Term Goal 2 (Week 1): Pt will perform sit to stand with mod assist during LB selfcare. OT Short Term Goal 2 - Progress (Week 1): Met OT Short Term Goal 3 (Week 1): Pt will perform toilet transfer with mod assist stand pivot. OT Short Term Goal 3 - Progress (Week 1): Met OT Short Term Goal 4  (Week 1): Pt will perform LB bathing sit to stand with mod assist and AE PRN. OT Short Term Goal 4 - Progress (Week 1): Met Week 2:  OT Short Term Goal 1 (Week 2): Patient will perform LB bath to include sit and stand with min assist OT Short Term Goal 2 (Week 2): Patient will perform LB dressing to include sit and stand with min assist OT Short Term Goal 3 (Week 2): Patient will perform toilet transfers with min assist with stand pivot. OT Short Term Goal 4 (Week 2): Patient will place himself independently in the 2 sidelying bed positions he has learned.  Skilled Therapeutic Interventions/Progress Updates:  1)  Patient sleeping in bed upon arrival for schedule bathing and dressing session with OT. Patient stating that he already sponge bathed this morning with nursing however, patient did not change his shirt and did not bathe below his waist.  Patient declined bathing this am. OT goals related to bathing and dressing were discussed/reviewed therefore, patient agreed to shower in the am tomorrow.  Session focused on bed mobility and sidely-sit techniques, optimal bed positioning on his side to normalize tone, stand step transfers bed><w/c with focus on slow and controlled movements.  Stand with min/steady assist at sink to wash his TED hose to practice BUE use as well as standing balance.  Patient with less fatigue and able to engage in therapy better today than yesterday. Patient left with all items within reach and lunch tray just arrived.  2)  Patient in w/c upon arrival.  Engaged in sit><stands, squats while side stepping with hands on  rail in hallway, RUE and LUE gross and fine motor control activities while seated.  Therapy Documentation Precautions:  Precautions Precautions: Fall Precaution Comments: increased extensor tone in L UE/LE, impulsive, ataxic Restrictions Weight Bearing Restrictions: Yes Pain: Denies pain for both sessions See FIM for current functional  status  Therapy/Group: Individual Therapy for both sessions  Bonne Whack 06/18/2013, 4:59 PM

## 2013-06-19 ENCOUNTER — Inpatient Hospital Stay (HOSPITAL_COMMUNITY): Payer: Medicare Other | Admitting: Rehabilitation

## 2013-06-19 ENCOUNTER — Encounter (HOSPITAL_COMMUNITY): Payer: Medicare Other

## 2013-06-19 ENCOUNTER — Inpatient Hospital Stay (HOSPITAL_COMMUNITY): Payer: Medicare Other

## 2013-06-19 DIAGNOSIS — I6330 Cerebral infarction due to thrombosis of unspecified cerebral artery: Secondary | ICD-10-CM

## 2013-06-19 DIAGNOSIS — G8110 Spastic hemiplegia affecting unspecified side: Secondary | ICD-10-CM

## 2013-06-19 NOTE — Patient Care Conference (Signed)
Inpatient RehabilitationTeam Conference and Plan of Care Update Date: 06/19/2013   Time: 11:10 Am    Patient Name: [name]      Medical Record Number: [id number]  Date of Birth: Feb 19, 1961 Sex: Male         Room/Bed: 4W03C/4W03C-01 Payor Info: Payor: Joette Catching MEDICARE / Plan: [employer] MEDICARE / Product Type: *No Product type* /    Admitting Diagnosis: LT CVA  Admit Date/Time:  06/11/2013  4:56 PM Admission Comments: No comment available   Primary Diagnosis:  Thrombotic cerebral infarction Principal Problem: Thrombotic cerebral infarction  Patient Active Problem List   Diagnosis Date Noted  . 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 CVA (cerebral infarction) 06/08/2013  . Special screening for malignant neoplasms, colon 04/05/2013  . Tick bite 04/01/2013  . Microcytosis 01/08/2013  . Screening for viral disease 01/04/2013  . Plantar fasciitis 11/20/2012  . Shoulder pain 04/10/2012  . Osteoarthritis 03/27/2012  . BPH (benign prostatic hyperplasia) 06/06/2011  . Preventative health care 06/06/2011  . CVA (cerebral vascular accident) 05/06/2011  . TORTICOLLIS, SPASMODIC 04/27/2007  . ERECTILE DYSFUNCTION 09/19/2006  . DRUG ABUSE, HX OF 09/19/2006  . HYPERLIPIDEMIA 09/18/2006  . HYPERTENSION 09/18/2006    Expected Discharge Date: Expected Discharge Date: 07/09/13  Team Members Present: Physician leading conference: Dr. Faith Rogue Social Worker Present: Staci Acosta, LCSW;Becky Sweta Halseth, LCSW Nurse Present: Kennon Portela, RN PT Present: Edman Circle, PT;Bridgett Ripa, Clearnce Hasten, PT OT Present: Rosalio Loud, OT;Kris Gellert, Carollee Sires, OT SLP Present: Fae Pippin, Charlie Pitter, SLP     Current Status/Progress Goal Weekly Team Focus  Medical   spastic left hemiparesis better. insomnia, some anxiety also  see prior  spasticity control, sleep restoration   Bowel/Bladder   continent of bowel and bladder  Remain continent of bowel  and bladder with min assistance      Swallow/Nutrition/ Hydration             ADL's   Overall Mod assist for BADL tasks to include need for vcs for safety and increased time.  Goals are overall supervision for toileting, bathing, dressing, and modified independent for grooming and feeding.  slow and controlled movements, weight bearing, dynamic balance during functional mobility and transitional movements, coordination   Mobility   Mod to max assist for standing and transfers, +2 for ambulation with [name] walker.   Supervision to min assist overall  controlled movements, dynamic sitting and standing balance, trunk control, gait and safety.    Communication             Safety/Cognition/ Behavioral Observations            Pain   pain to BL shoulders; ultram 50mg -100mg  PRN qhrs  Pain level at or below 2  monitor and assess pain   Skin   Skin CDI  No skin infection/breakdown   Monitor/assess skin q shift       *See Care Plan and progress notes for long and short-term goals.  Barriers to Discharge: tone, anxiety    Possible Resolutions to Barriers:  egosupport, NMR    Discharge Planning/Teaching Needs:  Home with son and wife-son there during the day while wife works      Team Discussion:  Very motivated to improve.  Spasiticity left side impedes him, not sleeping placed on sleep med.    Revisions to Treatment Plan:  None   Continued Need for Acute Rehabilitation Level of Care: The patient requires daily medical management by a physician with specialized training  in physical medicine and rehabilitation for the following conditions: Daily direction of a multidisciplinary physical rehabilitation program to ensure safe treatment while eliciting the highest outcome that is of practical value to the patient.: Yes Daily medical management of patient stability for increased activity during participation in an intensive rehabilitation regime.: Yes Daily analysis of laboratory values and/or  radiology reports with any subsequent need for medication adjustment of medical intervention for : Neurological problems  Staci Dack, Lemar Livings 06/20/2013, 8:51 AM

## 2013-06-19 NOTE — Progress Notes (Signed)
Physical Therapy Session Note  Patient Details  Name: [name] MRN: [id number] Date of Birth: 19-Apr-1961  Today's Date: 06/19/2013 Time: 0917-1001 Time Calculation (min): 44 min  Short Term Goals: Week 1:  PT Short Term Goal 1 (Week 1): Pt will perform stand pivot transfer at mod assist and min cuing for technique.  PT Short Term Goal 2 (Week 1): Pt will perform dynamic sitting balance at min assist level PT Short Term Goal 3 (Week 1): Pt will ambulate x 68' with LRAD at max assist PT Short Term Goal 4 (Week 1): Pt will correct R lateral leaning in sitting/standing with min assist and min cues.  PT Short Term Goal 5 (Week 1): Pt will perform dynamic standing balance activities at mod assist.   Skilled Therapeutic Interventions/Progress Updates:   Focus of session was NMR for improved weight bearing, weight shifting, and trunk rotation.  Performed standing weight shifting and trunk rotation reaching for ball to one side and passing to other side to/from target with mod assist and cues for increased weight shifting (increased hip ext and quad activation), increased trunk rotation and utilizing B UE to reach for ball.  Attempted quadruped, however pt with increased pain in L wrist/hand due to increased tone, therefore performed tall kneeling with small table in front with weight shifts side to side.  Focus and manual facilitation for increased glute activation, upright posture and shifting with hips instead of using UEs to assist with shifting. Transfers w/c <> mat table at mod assist and bed mobility at supervision to min assist level.  Pt assisted back to room and was left in w/c with quick release belt in place, call [place], and phone in pts reach. Note pt with increased frustration about not being able to perform tall kneeling activity very well, however he had increased c/o knee pain during activity, therefore did not continue activity.   Therapy Documentation Precautions:   Precautions Precautions: Fall Precaution Comments: increased extensor tone in L UE/LE, impulsive, ataxic Restrictions Weight Bearing Restrictions: No   Pain: Pain Assessment Pain Assessment: No/denies pain   See FIM for current functional status  Therapy/Group: Individual Therapy  [name] 06/19/2013, 10:18 AM

## 2013-06-19 NOTE — Progress Notes (Signed)
Patient ID: [name], male   DOB: August 21, 1961, 52 y.o.   MRN: [id number] Subjective/Complaints: 52 y.o. 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 Patient states to be independent prior to admission living with his wife who works day shift. Admitted 06/08/2013 with right-sided weakness. MRI of the brain showed a 1.5 cm region of acute infarction affecting the white matter adjacent to posterior body of the left lateral ventricle with extension into the external capsule region as well as extensive ischemic changes throughout the brain. Carotid Dopplers with less than 40% ICA stenosis. Echocardiogram with ejection fraction of 70% grade 1 diastolic dysfunction. Urine drug screen was negative. Patient did not receive TPA. Neurology services consulted maintained on aspirin therapy for CVA prophylaxis as well as subcutaneous Lovenox for DVT prophylaxis  Up with therapies already. Sleep perhaps a little better.  A 12 point review of systems has been performed and if not noted above is otherwise negative.   Objective: Vital Signs: Blood pressure 138/96, pulse 82, temperature 98.2 F (36.8 C), temperature source Oral, resp. rate 18, height 5' 6.14" (1.68 m), weight 63.5 kg (139 lb 15.9 oz), SpO2 98.00%. No results found. Results for orders placed during the hospital encounter of 06/11/13 (from the past 72 hour(s))  BASIC METABOLIC PANEL     Status: Abnormal   Collection Time    06/18/13  6:00 AM      Result Value Range   Sodium 138  135 - 145 mEq/L   Potassium 3.7  3.5 - 5.1 mEq/L   Chloride 98  96 - 112 mEq/L   CO2 28  19 - 32 mEq/L   Glucose, Bld 101 (*) 70 - 99 mg/dL   BUN 15  6 - 23 mg/dL   Creatinine, Ser 7.82 (*) 0.50 - 1.35 mg/dL   Calcium 9.6  8.4 - 95.6 mg/dL   GFR calc non Af Amer 59 (*) >90 mL/min   GFR calc Af Amer 68 (*) >90 mL/min   Comment:            The eGFR has been  calculated     using the CKD EPI equation.     This calculation has not been     validated in all clinical     situations.     eGFR's persistently     <90 mL/min signify     possible Chronic Kidney Disease.     HEENT: normal Cardio: tachy and no murmurs Resp: CTA B/L and unlabored GI: BS positive and non tender Extremity:  Pulses positive and No Edema Skin:   Intact Neuro: Alert/Oriented, Cranial Nerve II-XII normal, Abnormal Sensory decreased LUE/LLE LT (distal more so than prox), Abnormal Motor 3-/5 L delt,bi,tri,grip,3+ HF,KE ADF,4/5 on R side  Abnormal FMC Ataxic/ dec FMC and Tone  Hypertonic on L side 1-2/4, Tone:  normal tone on R side and Dysarthric Musc/Skel:  Other R shoulder pain with abduction Gen NAD   Assessment/Plan: 1. 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 Physiatrist is providing close team supervision and 24 hour management of active medical problems listed below. Physiatrist and rehab team continue to assess barriers to discharge/monitor patient progress toward functional and medical goals. FIM: FIM - Bathing Bathing Steps Patient Completed: Chest;Right Arm;Left Arm;Abdomen;Front perineal area;Buttocks (declined  to bathe LEs) Bathing: 4: Min-Patient completes 8-9 67f 10 parts or 75+ percent  FIM - Upper Body Dressing/Undressing Upper body dressing/undressing steps patient completed: Thread/unthread left sleeve of pullover shirt/dress;Put head through opening of pull over shirt/dress;Pull shirt over trunk;Thread/unthread right sleeve of pullover shirt/dresss Upper body dressing/undressing: 5: Set-up assist to: Obtain clothing/put away FIM - Lower Body Dressing/Undressing Lower body dressing/undressing steps patient completed: Thread/unthread left underwear leg;Thread/unthread right underwear  leg;Thread/unthread left pants leg;Thread/unthread right pants leg Lower body dressing/undressing: 2: Max-Patient completed 25-49% of tasks  FIM - Toileting Toileting steps completed by patient: Adjust clothing prior to toileting Toileting: 4: Steadying assist (stand to urinate)  FIM - Diplomatic Services operational officer Devices: Elevated toilet seat;Grab bars Toilet Transfers: 0-Activity did not occur  FIM - Banker Devices: Arm rests;Bed rails Bed/Chair Transfer: 4: Supine > Sit: Min A (steadying Pt. > 75%/lift 1 leg);4: Bed > Chair or W/C: Min A (steadying Pt. > 75%);4: Chair or W/C > Bed: Min A (steadying Pt. > 75%)  FIM - Locomotion: Wheelchair Distance: 75 Locomotion: Wheelchair: 2: Travels 50 - 149 ft with supervision, cueing or coaxing FIM - Locomotion: Ambulation Locomotion: Ambulation Assistive Devices: Interior and spatial designer Ambulation/Gait Assistance: 1: +2 Total assist Locomotion: Ambulation: 1: Two helpers  Comprehension Comprehension Mode: Auditory Comprehension: 5-Understands basic 90% of the time/requires cueing < 10% of the time  Expression Expression Mode: Verbal Expression: 5-Expresses basic needs/ideas: With extra time/assistive device  Social Interaction Social Interaction: 5-Interacts appropriately 90% of the time - Needs monitoring or encouragement for participation or interaction.  Problem Solving Problem Solving: 5-Solves basic 90% of the time/requires cueing < 10% of the time  Memory Memory: 5-Recognizes or recalls 90% of the time/requires cueing < 10% of the time  Medical Problem List and Plan:  1. Thrombotic left periventricular,EC infarct  2. DVT Prophylaxis/Anticoagulation: Subcutaneous Lovenox. Monitor platelet counts and any signs of bleeding  3. Pain Management: Tylenol as needed  4. Neuropsych: This patient is capable of making decisions on his own behalf.   -added trazodone for sleep 5.  Hypertension. Presently on Norvasc 10 mg daily. Monitor with increased mobility. Patient also on Tenormin 50 mg daily, hydrochlorothiazide 25 mg daily and lisinopril 40 mg daily prior to admission. --resumed hctz yesterday with some improvement seen  -continue to follow and adjust as needed 6. 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 Latest urine drug screen negative  7. Hyperlipidemia. Zocor  8. 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  -low dose baclofen trial- titrated to 10mg  tid   -continue ROM, splinting with therapy  LOS (Days) 8 A FACE TO FACE EVALUATION WAS PERFORMED  SWARTZ,ZACHARY T 06/19/2013, 8:52 AM

## 2013-06-19 NOTE — Progress Notes (Signed)
Physical Therapy Session Note  Patient Details  Name: [name] MRN: [id number] Date of Birth: Jun 11, 1961  Today's Date: 06/19/2013 Time: 1400-1503 Time Calculation (min): 63 min  Short Term Goals: Week 1:  PT Short Term Goal 1 (Week 1): Pt will perform stand pivot transfer at mod assist and min cuing for technique.  PT Short Term Goal 2 (Week 1): Pt will perform dynamic sitting balance at min assist level PT Short Term Goal 3 (Week 1): Pt will ambulate x 75' with LRAD at max assist PT Short Term Goal 4 (Week 1): Pt will correct R lateral leaning in sitting/standing with min assist and min cues.  PT Short Term Goal 5 (Week 1): Pt will perform dynamic standing balance activities at mod assist.   Skilled Therapeutic Interventions/Progress Updates:   Focus of session was performing passive stretching to decrease spasticity/tone prior to ambulation and gait training with shopping cart and then RW.  Performed knee to chest stretch x 2 BLE x 30 secs each.  Also performed L IR and ER stretch x 2 reps x 30 secs each.  Performed hooklying pelvic rotation stretch x 4 reps x 30 secs each.  Also educated in upper traps stretch for neck.  Gait training x approx 150' with shopping cart to focus on increasing WB through LEs and less through UEs.  Manual facilitation and cues for proper weight shifting, increased step length and width, upright posture and increased activation of glutes and quads when in stance phase.  Progressed to gait training with RW with same cues and facilitation as mentioned above with some assist for maintaining RW in contact with ground throughout ambulation and for correct pt positioning inside of RW.  Ambulation continues to require +2 for safety and negotiation of AD.    Therapy Documentation Precautions:  Precautions Precautions: Fall Precaution Comments: increased extensor tone in L UE/LE, impulsive, ataxic Restrictions Weight Bearing Restrictions: No   Pain: Pain  Assessment Pain Score: 5  Pain Type: Acute pain Pain Location: Shoulder Pain Orientation: Left;Right Pain Descriptors / Indicators: Aching;Spasm Pain Intervention(s): Medication (See eMAR) Locomotion : Ambulation Ambulation/Gait Assistance: 1: +2 Total assist   See FIM for current functional status  Therapy/Group: Individual Therapy  [name] 06/19/2013, 3:22 PM

## 2013-06-19 NOTE — Progress Notes (Signed)
Occupational Therapy Note  Patient Details  Name: [name] MRN: [id number] Date of Birth: 09/11/1961 Today's Date: 06/19/2013  Time #1: 7:30-8:34am ( .) Tx #1: Pt seen for 1:1 OT session focusing on transfers, sit <> stands and activity tolerance during ADL's. Pt supine in bed upon arrival, having just gotten breakfast tray. Allowed pt to eat a bit with HOB raised before shower. Pt actively eating and opening containers with R hand, using L as stabilizer secondary to tone.  Min A for transfers throughout session. Pt showered with max A only for back and bil feet. Pt dressed from wheelchair, standing at sink 2x. Oral hygiene and grooming completed at sink level. Pt staying in wheelchair with quick release belt donned, call Olivarez in place to finish breakfast.  Time #2: 11:34-12:04pm ( .) Tx #2: Pt seen for activity/standing tolerance, FMC and dexterity. Pt stood at raised table to place rings on rods, clipping and unclipping clothespins and  Placing small pegs into board, all activities using bil UE's. Increased tone on LUE while using RUE. Therapist placed hand on top of pt's L hand to stretch and keep digits in extension while pt using R hand. Pt leaning L during standing, which he can feel and corrects with min manual cueing. Pt used translation/retranslation to pick up pegs and place them with R hand. Finished session with pt in wheelchair with quick release belt, call [place]ia and lunch in place. No pain reported.   Xion Debruyne [name] 06/19/2013, 12:19 PM

## 2013-06-20 ENCOUNTER — Inpatient Hospital Stay (HOSPITAL_COMMUNITY): Payer: Medicare Other | Admitting: Occupational Therapy

## 2013-06-20 ENCOUNTER — Encounter (HOSPITAL_COMMUNITY): Payer: Medicare Other | Admitting: Occupational Therapy

## 2013-06-20 ENCOUNTER — Inpatient Hospital Stay (HOSPITAL_COMMUNITY): Payer: Medicare Other | Admitting: Rehabilitation

## 2013-06-20 DIAGNOSIS — G8110 Spastic hemiplegia affecting unspecified side: Secondary | ICD-10-CM

## 2013-06-20 DIAGNOSIS — I6330 Cerebral infarction due to thrombosis of unspecified cerebral artery: Secondary | ICD-10-CM

## 2013-06-20 MED ORDER — FLUTICASONE PROPIONATE 50 MCG/ACT NA SUSP
2.0000 | Freq: Two times a day (BID) | NASAL | Status: DC
  Administered 2013-06-20 – 2013-07-09 (×37): 2 via NASAL
  Filled 2013-06-20 (×2): qty 16

## 2013-06-20 NOTE — Progress Notes (Signed)
Physical Therapy Weekly Progress Note  Patient Details  Name: [name] MRN: [id number] Date of Birth: 18-Jul-1961  Today's Date: 06/20/2013 Time: 4132-4401 Time Calculation (min): 41 min  Patient has met 3 of 5 short term goals.  Pt requires mod assist overall for most mobility, however continues to have difficulty maintaining standing static and dynamic balance.  Note that he is also progressing with ambulation, however still requires +2 assist for safety.  He is also doing well with more slow controlled movements.    Patient continues to demonstrate the following deficits: static and dynamic balance, gait, increased tone, ataxia, safety and therefore will continue to benefit from skilled PT intervention to enhance overall performance with activity tolerance, balance, postural control, functional use of  right upper extremity, right lower extremity, left upper extremity and left lower extremity and coordination.  Patient progressing toward long term goals..  Continue plan of care.  PT Short Term Goals Week 2:  PT Short Term Goal 1 (Week 2): Pt will perform transfers at min assist level. PT Short Term Goal 2 (Week 2): Pt will perform dynamic sitting balance at supervision level PT Short Term Goal 3 (Week 2): Pt will ambulate x 73' with LRAD at max assist PT Short Term Goal 4 (Week 2): Pt will perform dynamic standing balance activities at min assist.   Skilled Therapeutic Interventions/Progress Updates:   Pt in restroom when PT arrived, therefore assisted pt back into w/c at min assist using grab bar on wall.  Cues for safety and hand placement.  Performed 4" and 6" step x 2 reps with B handrails at mod assist with cues for step to technique, upright posture, hand placement on rails and slow, controlled steps.  Continues to demonstrate poor postural control with forward flexed trunk and decreased hip extension.  Performed standing tapping to cones at mod/max assist with manual facilitation  for increased quad and glute activation for stance leg and max cues for slow controlled movement with tapping leg.  Note pt with decreased focus today in gym due to noisy environment.  Required many cues to re-direct.  Performed 130' w/c mobility back towards room utilizing BLEs and R UE with cues for increased hamstring facilitation in LLE.  Transferred back to bed at mod assist and returned to supine position with bed alarm set and phone/call [place] in reach.    Therapy Documentation Precautions:  Precautions Precautions: Fall Precaution Comments: increased extensor tone in L UE/LE, impulsive, ataxic Restrictions Weight Bearing Restrictions: No   Pain: Pt continues to have some pain in back and neck, but states that pain meds are helping some.    See FIM for current functional status  Therapy/Group: Individual Therapy  [name] 06/20/2013, 4:07 PM

## 2013-06-20 NOTE — Progress Notes (Signed)
Physical Therapy Session Note  Patient Details  Name: [name] MRN: [id number] Date of Birth: November 10, 1961  Today's Date: 06/20/2013 Time: 0930-1026 Time Calculation (min): 56 min  Short Term Goals: Week 1:  PT Short Term Goal 1 (Week 1): Pt will perform stand pivot transfer at mod assist and min cuing for technique.  PT Short Term Goal 2 (Week 1): Pt will perform dynamic sitting balance at min assist level PT Short Term Goal 3 (Week 1): Pt will ambulate x 38' with LRAD at max assist PT Short Term Goal 4 (Week 1): Pt will correct R lateral leaning in sitting/standing with min assist and min cues.  PT Short Term Goal 5 (Week 1): Pt will perform dynamic standing balance activities at mod assist.   Skilled Therapeutic Interventions/Progress Updates:   Pt asleep in bed when PT arrived, but easily aroused and agreeable to therapy.  Note that pt very "groggy" throughout session and more fatigued, however he did state that he got some rest last night.  Supine to sit performed at supervision level, however noted increased difficulty and more time required to complete task this morning.  Performed sit <> stand in order to don shorts at mod assist with pt able to pull up shorts on his own.  Performed car transfer x 1 rep via stand pivot transfer at mod/max level and cues for technique and hand placement.  He was able to get BLEs into car on his own, with increased time to complete task.  Performed dynamic sitting activity on balance disc with side to side weight shifts for trunk shortening with UE support, without UE support then with LEs crossed.   Again, note increased difficulty sustaining attention during activity and pt voicing frustration without feeling tired.  Assisted back to room with call [name]/phone in reach.    Therapy Documentation Precautions:  Precautions Precautions: Fall Precaution Comments: increased extensor tone in L UE/LE, impulsive, ataxic Restrictions Weight Bearing  Restrictions: No   Pain: pt with no c/o pain during session.  See FIM for current functional status  Therapy/Group: Individual Therapy  [name] 06/20/2013, 12:15 PM

## 2013-06-20 NOTE — Progress Notes (Signed)
Occupational Therapy Session Note  Patient Details  Name: [name] MRN: [id number] Date of Birth: [birth year]  Today's Date: 06/20/2013 Time: 1035-1140 Time Calculation (min): 65 min   Skilled Therapeutic Interventions/Progress Updates:    Patient seen for 1:1 OT intervention this am to address bathing, dressing and hygiene skills integrating both sides of body.  Patient with spasticity present throughout left side, although reports recent low dose baclofen seems to be improving active movement at least in left hand.  Patient able to grasp and pull effectively with left hand several times during this session.  Patient able to transition from sit to/from stand with supervision, using upper extremity support.  Patient able to free hands from support to complete function, e.g. Bathing, or pulling up pants, for brief periods of time (seconds.)  Patient has atypical patterns of movement from previous strokes, but clearly seems to have a definitive paln for each step of the motor task.  Patient does move quickly at times, but this seems to be an effective strategy of using momentum to overcome inirtia / joint tightness.  His patterns of movement during this session were in no way unsafe, although a bit unconventional.  Patient requesting to have lap belt removed.  Spoke with patient's RN today, and agreed to trial no quick release belt in wheelchair or recliner. Patient appreciative, and asks that next we consider discontinuing the bed alarm which impedes his sleep every night.    Therapy Documentation Precautions:  Precautions Precautions: Fall Precaution Comments: increased extensor tone in L UE/LE, impulsive, ataxic Restrictions Weight Bearing Restrictions: No   Pain:  No report of pain     See FIM for current functional status  Therapy/Group: Individual Therapy  Collier Salina 06/20/2013, 11:53 AM

## 2013-06-20 NOTE — Progress Notes (Signed)
Patient ID: [name], male   DOB: 1961/10/26, 52 y.o.   MRN: [id number] Subjective/Complaints: 52 y.o. 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 Patient states to be independent prior to admission living with his wife who works day shift. Admitted 06/08/2013 with right-sided weakness. MRI of the brain showed a 1.5 cm region of acute infarction affecting the white matter adjacent to posterior body of the left lateral ventricle with extension into the external capsule region as well as extensive ischemic changes throughout the brain. Carotid Dopplers with less than 40% ICA stenosis. Echocardiogram with ejection fraction of 70% grade 1 diastolic dysfunction. Urine drug screen was negative. Patient did not receive TPA. Neurology services consulted maintained on aspirin therapy for CVA prophylaxis as well as subcutaneous Lovenox for DVT prophylaxis  Pt notes difficulty with speech A 12 point review of systems has been performed and if not noted above is otherwise negative.   Objective: Vital Signs: Blood pressure 129/89, pulse 97, temperature 98.2 F (36.8 C), temperature source Oral, resp. rate 19, height 5' 6.14" (1.68 m), weight 84.142 kg (185 lb 8 oz), SpO2 95.00%. No results found. Results for orders placed during the hospital encounter of 06/11/13 (from the past 72 hour(s))  BASIC METABOLIC PANEL     Status: Abnormal   Collection Time    06/18/13  6:00 AM      Result Value Range   Sodium 138  135 - 145 mEq/L   Potassium 3.7  3.5 - 5.1 mEq/L   Chloride 98  96 - 112 mEq/L   CO2 28  19 - 32 mEq/L   Glucose, Bld 101 (*) 70 - 99 mg/dL   BUN 15  6 - 23 mg/dL   Creatinine, Ser 4.09 (*) 0.50 - 1.35 mg/dL   Calcium 9.6  8.4 - 81.1 mg/dL   GFR calc non Af Amer 59 (*) >90 mL/min   GFR calc Af Amer 68 (*) >90 mL/min   Comment:            The eGFR has been calculated     using the CKD  EPI equation.     This calculation has not been     validated in all clinical     situations.     eGFR's persistently     <90 mL/min signify     possible Chronic Kidney Disease.     HEENT: normal Cardio: tachy and no murmurs Resp: CTA B/L and unlabored GI: BS positive and non tender Extremity:  Pulses positive and No Edema Skin:   Intact Neuro: Alert/Oriented, Cranial Nerve II-XII normal, Abnormal Sensory decreased LUE/LLE LT (distal more so than prox), Abnormal Motor 3-/5 L delt,bi,tri,grip,3+ HF,KE ADF,4/5 on R side  Abnormal FMC Ataxic/ dec FMC and Tone  Hypertonic on L side 1-2/4, Tone:  normal tone on R side and Dysarthric Musc/Skel:  Other R shoulder pain with abduction Gen NAD   Assessment/Plan: 1. 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 Physiatrist is providing close team supervision and 24 hour management of active medical problems listed below. Physiatrist and rehab team continue to assess barriers to discharge/monitor patient progress toward functional and medical goals. FIM: FIM - Bathing Bathing Steps Patient Completed: Chest;Right Arm;Left Arm;Abdomen;Front perineal area;Buttocks (declined to  bathe LEs) Bathing: 4: Min-Patient completes 8-9 54f 10 parts or 75+ percent  FIM - Upper Body Dressing/Undressing Upper body dressing/undressing steps patient completed: Thread/unthread left sleeve of pullover shirt/dress;Put head through opening of pull over shirt/dress;Pull shirt over trunk;Thread/unthread right sleeve of pullover shirt/dresss Upper body dressing/undressing: 5: Set-up assist to: Obtain clothing/put away FIM - Lower Body Dressing/Undressing Lower body dressing/undressing steps patient completed: Pull pants up/down Lower body dressing/undressing: 4: Min-Patient completed 75  plus % of tasks  FIM - Toileting Toileting steps completed by patient: Adjust clothing prior to toileting Toileting Assistive Devices: Toilet Aid/prosthesis/orthosis (urinal in bed) Toileting: 5: Set-up assist to: Obtain supplies  FIM - Diplomatic Services operational officer Devices: Elevated toilet seat;Grab bars Toilet Transfers: 0-Activity did not occur  FIM - Banker Devices: Bed rails Bed/Chair Transfer: 5: Supine > Sit: Supervision (verbal cues/safety issues);5: Sit > Supine: Supervision (verbal cues/safety issues);4: Bed > Chair or W/C: Min A (steadying Pt. > 75%);3: Chair or W/C > Bed: Mod A (lift or lower assist)  FIM - Locomotion: Wheelchair Distance: 75 Locomotion: Wheelchair: 2: Travels 50 - 149 ft with supervision, cueing or coaxing FIM - Locomotion: Ambulation Locomotion: Ambulation Assistive Devices: Other (comment);Walker - Rolling (shopping cart) Ambulation/Gait Assistance: 1: +2 Total assist Locomotion: Ambulation: 1: Two helpers  Comprehension Comprehension Mode: Auditory Comprehension: 5-Understands basic 90% of the time/requires cueing < 10% of the time  Expression Expression Mode: Verbal Expression: 5-Expresses basic needs/ideas: With no assist  Social Interaction Social Interaction: 5-Interacts appropriately 90% of the time - Needs monitoring or encouragement for participation or interaction.  Problem Solving Problem Solving: 5-Solves basic 90% of the time/requires cueing < 10% of the time  Memory Memory: 5-Recognizes or recalls 90% of the time/requires cueing < 10% of the time  Medical Problem List and Plan:  1. Thrombotic left periventricular,EC infarct  2. DVT Prophylaxis/Anticoagulation: Subcutaneous Lovenox. Monitor platelet counts and any signs of bleeding  3. Pain Management: Tylenol as needed  4. Neuropsych: This patient is capable of making decisions on his own behalf.   -added trazodone for  sleep 5. Hypertension. Presently on Norvasc 10 mg daily. Monitor with increased mobility. Patient also on Tenormin 50 mg daily, hydrochlorothiazide 25 mg daily and lisinopril 40 mg daily prior to admission. --resumed hctz yesterday with some improvement seen  -continue to follow and adjust as needed 6. 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 Latest urine drug screen negative  7. Hyperlipidemia. Zocor  8. 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  -low dose baclofen trial- titrated to 10mg  tid   -continue ROM, splinting with therapy  LOS (Days) 9 A FACE TO FACE EVALUATION WAS PERFORMED  KIRSTEINS,ANDREW E 06/20/2013, 8:38 AM

## 2013-06-20 NOTE — Progress Notes (Signed)
Occupational Therapy Session Note  Patient Details  Name: [name] MRN: [id number] Date of Birth: 09/05/1961  Today's Date: 06/20/2013 Time: 1335-1400 Time Calculation (min): 25 min  Short Term Goals: Week 1:  OT Short Term Goal 1 (Week 1): Pt will maintain dynamic sitting balance EOC or bed during bathing with min assist. OT Short Term Goal 1 - Progress (Week 1): Met OT Short Term Goal 2 (Week 1): Pt will perform sit to stand with mod assist during LB selfcare. OT Short Term Goal 2 - Progress (Week 1): Met OT Short Term Goal 3 (Week 1): Pt will perform toilet transfer with mod assist stand pivot. OT Short Term Goal 3 - Progress (Week 1): Met OT Short Term Goal 4 (Week 1): Pt will perform LB bathing sit to stand with mod assist and AE PRN. OT Short Term Goal 4 - Progress (Week 1): Met  Skilled Therapeutic Interventions/Progress Updates:    Pt seen for 1:1 OT with focus on slow and controlled movements with BUE and trunk rotation in standing.  Engaged in trunk rotation task in standing with reaching outside BOS with RUE and crossing midline to promote trunk rotation to place ring on target, progressing to use of LUE.  Manual facilitation at hips and trunk to promote rotation as pt extremely tight due to increased tone.  Noted pt with difficulty grading movement with reaching.  Initial sit to stand mod assist, progressed to min assist by end of session.  Therapy Documentation Precautions:  Precautions Precautions: Fall Precaution Comments: increased extensor tone in L UE/LE, impulsive, ataxic Restrictions Weight Bearing Restrictions: No Pain:   Pt with no c/o pain this session.  See FIM for current functional status  Therapy/Group: Individual Therapy  Rosalio Loud 06/20/2013, 2:42 PM

## 2013-06-21 ENCOUNTER — Inpatient Hospital Stay (HOSPITAL_COMMUNITY): Payer: Medicare Other | Admitting: Occupational Therapy

## 2013-06-21 ENCOUNTER — Inpatient Hospital Stay (HOSPITAL_COMMUNITY): Payer: Medicare Other | Admitting: Rehabilitation

## 2013-06-21 ENCOUNTER — Encounter (HOSPITAL_COMMUNITY): Payer: Medicare Other

## 2013-06-21 DIAGNOSIS — G8110 Spastic hemiplegia affecting unspecified side: Secondary | ICD-10-CM

## 2013-06-21 DIAGNOSIS — I6330 Cerebral infarction due to thrombosis of unspecified cerebral artery: Secondary | ICD-10-CM

## 2013-06-21 MED ORDER — BACLOFEN 10 MG PO TABS
10.0000 mg | ORAL_TABLET | Freq: Four times a day (QID) | ORAL | Status: DC
  Administered 2013-06-21 – 2013-06-27 (×27): 10 mg via ORAL
  Filled 2013-06-21 (×33): qty 1

## 2013-06-21 NOTE — Progress Notes (Signed)
Physical Therapy Session Note  Patient Details  Name: [name] MRN: [id number] Date of Birth: 11/05/1961  Today's Date: 06/21/2013 Time: 4098-1191 Time Calculation (min): 54 min  Short Term Goals: Week 2:  PT Short Term Goal 1 (Week 2): Pt will perform transfers at min assist level. PT Short Term Goal 2 (Week 2): Pt will perform dynamic sitting balance at supervision level PT Short Term Goal 3 (Week 2): Pt will ambulate x 46' with LRAD at max assist PT Short Term Goal 4 (Week 2): Pt will perform dynamic standing balance activities at min assist.   Skilled Therapeutic Interventions/Progress Updates:   Pt stood at dryer to obtain clothing with L UE support on washing and mod assist when bending forward.  Also had pt fold clothing without UE support and min to mod assist for minor adjustments needed to balance.  Pt overall did very well folding clothes without support, but did lean on dryer with last item for increased support.  Ambulated in // bars for focus on wider steps using red line for visual input, upright posture and decreased support of UEs.  Performed side stepping x 3 down and back in // bars with BUE support, then single UE support and then no UE support with manual and verbal cues for increased weight shift, slower/more controlled stepping and to bring feet together before stepping again.  Also performed reaching activity in // bars with focus on increased trunk rotation with weight shifting.  Pt requires many standing resting breaks during activity this afternoon due to fatigue and focus on activity.   Therapy Documentation Precautions:  Precautions Precautions: Fall Precaution Comments: increased extensor tone in L UE/LE, impulsive, ataxic Restrictions Weight Bearing Restrictions: No   Pain: pt with no c/o pain but does mention that he is tired this afternoon.       See FIM for current functional status  Therapy/Group: Individual Therapy  [name] 06/21/2013, 4:15 PM

## 2013-06-21 NOTE — Progress Notes (Signed)
Occupational Therapy Note  Patient Details  Name: [name] MRN: [id number] Date of Birth: November 28, 1960 Today's Date: 06/21/2013  Time: 10:35-11:33am ( ) Pt seen for 1:1 OT session with focus on functional mobility, transfers, safety awareness and coordination of UE's during ADL's. Pt finishing a PT session upon arrival, ready to shower. No c/o pain, although pt fatigued. Pt used wheelchair to go into shower, with min A for transfer. Pt able to complete bathing with S, max A only for back and steadying in assist when in standing to wash perineal/buttock area. Pt tends to hit elbow on shower wall while bathing, verbal cues to change position slightly to avoid injury. Pt dressed sitting in wheelchair at sink, standing for LB clothing management. Pt able to button/unbutton shorts with increased time. Pt also able to donn socks and shoes with increased time and steadying assist for safety. Pt completed grooming at sink. Call Novello in place and next therapy arriving at end of session.    [name] [name] 06/21/2013, 12:28 PM

## 2013-06-21 NOTE — Progress Notes (Signed)
Patient ID: [name], male   DOB: Jun 25, 1961, 52 y.o.   MRN: [id number] Subjective/Complaints: 52 y.o. 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 Patient states to be independent prior to admission living with his wife who works day shift. Admitted 06/08/2013 with right-sided weakness. MRI of the brain showed a 1.5 cm region of acute infarction affecting the white matter adjacent to posterior body of the left lateral ventricle with extension into the external capsule region as well as extensive ischemic changes throughout the brain. Carotid Dopplers with less than 40% ICA stenosis. Echocardiogram with ejection fraction of 70% grade 1 diastolic dysfunction. Urine drug screen was negative. Patient did not receive TPA. Neurology services consulted maintained on aspirin therapy for CVA prophylaxis as well as subcutaneous Lovenox for DVT prophylaxis  L hand clawing with activity A 12 point review of systems has been performed and if not noted above is otherwise negative.   Objective: Vital Signs: Blood pressure 134/87, pulse 81, temperature 98.2 F (36.8 C), temperature source Oral, resp. rate 20, height 5' 6.14" (1.68 m), weight 84.142 kg (185 lb 8 oz), SpO2 98.00%. No results found. No results found for this or any previous visit (from the past 72 hour(s)).   HEENT: normal Cardio: tachy and no murmurs Resp: CTA B/L and unlabored GI: BS positive and non tender Extremity:  Pulses positive and No Edema Skin:   Intact Neuro: Alert/Oriented, Cranial Nerve II-XII normal, Abnormal Sensory decreased LUE/LLE LT (distal more so than prox), Abnormal Motor 3-/5 L delt,bi,tri,grip,3+ HF,KE ADF,4/5 on R side  Abnormal FMC Ataxic/ dec FMC and Tone  Hypertonic on L side 2-3/4, Tone:  normal tone on R side and Dysarthric Musc/Skel:  Other R shoulder pain with abduction Gen NAD   Assessment/Plan: 1.  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 Physiatrist is providing close team supervision and 24 hour management of active medical problems listed below. Physiatrist and rehab team continue to assess barriers to discharge/monitor patient progress toward functional and medical goals. FIM: FIM - Bathing Bathing Steps Patient Completed: Chest;Right Arm;Left Arm;Abdomen;Front perineal area;Right upper leg;Left upper leg;Right lower leg (including foot) Bathing: 4: Min-Patient completes 8-9 6f 10 parts or 75+ percent  FIM - Upper Body Dressing/Undressing Upper body dressing/undressing steps patient completed: Thread/unthread right sleeve of pullover shirt/dresss;Thread/unthread left sleeve of pullover shirt/dress;Put head through opening of pull over shirt/dress;Pull shirt over trunk Upper body dressing/undressing: 5: Set-up assist to: Obtain clothing/put away FIM - Lower Body Dressing/Undressing Lower body dressing/undressing steps patient completed: Thread/unthread right underwear leg;Thread/unthread left underwear leg;Pull underwear up/down;Thread/unthread right pants leg;Thread/unthread left pants leg;Pull pants up/down;Don/Doff left sock;Don/Doff left shoe Lower body dressing/undressing: 4: Min-Patient completed 75 plus % of tasks  FIM - Toileting Toileting steps completed by patient: Adjust clothing prior to toileting Toileting Assistive Devices: Toilet Aid/prosthesis/orthosis (urinal in bed) Toileting: 5: Set-up assist to: Obtain supplies  FIM - Diplomatic Services operational officer Devices: Grab bars;Elevated toilet seat Toilet Transfers: 4-To toilet/BSC: Min A (steadying Pt. > 75%)  FIM - Bed/Chair Transfer Bed/Chair Transfer Assistive Devices: Bed rails Bed/Chair Transfer: 5: Supine > Sit:  Supervision (verbal cues/safety issues);3: Bed > Chair or W/C: Mod A (lift or lower assist)  FIM - Locomotion: Wheelchair Distance: 75 Locomotion: Wheelchair: 2:  Travels 50 - 149 ft with supervision, cueing or coaxing FIM - Locomotion: Ambulation Locomotion: Ambulation Assistive Devices: Other (comment);Walker - Rolling (shopping cart) Ambulation/Gait Assistance: 1: +2 Total assist Locomotion: Ambulation: 1: Two helpers  Comprehension Comprehension Mode: Auditory Comprehension: 4-Understands basic 75 - 89% of the time/requires cueing 10 - 24% of the time  Expression Expression Mode: Verbal Expression: 4-Expresses basic 75 - 89% of the time/requires cueing 10 - 24% of the time. Needs helper to occlude trach/needs to repeat words.  Social Interaction Social Interaction: 4-Interacts appropriately 75 - 89% of the time - Needs redirection for appropriate language or to initiate interaction.  Problem Solving Problem Solving: 4-Solves basic 75 - 89% of the time/requires cueing 10 - 24% of the time  Memory Memory: 5-Recognizes or recalls 90% of the time/requires cueing < 10% of the time  Medical Problem List and Plan:  1. Thrombotic left periventricular,EC infarct  2. DVT Prophylaxis/Anticoagulation: Subcutaneous Lovenox. Monitor platelet counts and any signs of bleeding  3. Pain Management: Tylenol as needed  4. Neuropsych: This patient is capable of making decisions on his own behalf.   -added trazodone for sleep 5. Hypertension. Presently on Norvasc 10 mg daily. Monitor with increased mobility. Patient also on Tenormin 50 mg daily, hydrochlorothiazide 25 mg daily and lisinopril 40 mg daily prior to admission. --resumed hctz yesterday with some improvement seen  -continue to follow and adjust as needed 6. 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 Latest urine drug screen negative  7. Hyperlipidemia. Zocor  8. Spasticity, chronic left sided tone from remote  cva.  -low dose baclofen trial- titrated up 10mg  qid   -continue ROM, splinting with therapy  LOS (Days) 10 A FACE TO FACE EVALUATION WAS PERFORMED  KIRSTEINS,ANDREW E 06/21/2013, 9:36 AM

## 2013-06-21 NOTE — Progress Notes (Signed)
Physical Therapy Session Note  Patient Details  Name: [name] MRN: [id number] Date of Birth: 1961/07/23  Today's Date: 06/21/2013 Time: 4098-1191 Time Calculation (min): 62 min  Short Term Goals: Week 2:  PT Short Term Goal 1 (Week 2): Pt will perform transfers at min assist level. PT Short Term Goal 2 (Week 2): Pt will perform dynamic sitting balance at supervision level PT Short Term Goal 3 (Week 2): Pt will ambulate x 84' with LRAD at max assist PT Short Term Goal 4 (Week 2): Pt will perform dynamic standing balance activities at min assist.   Skilled Therapeutic Interventions/Progress Updates:   Pt received in bed with pt stating he would like to change underwear and shorts due to urine spill from urinal.  Therefore focused first half of session on several controlled sit <> stands and also dynamic standing balance as pt assisted with pulling up/down clothing and cleaning/rinsing.   Performed all sit<> stands at min to mod assist (more mod when addressing clothing on his own) with continuous cues for increased forward weight shift and to increase activation of glutes and quads when standing.  Assisted pt to washing machine where he stood again at min assist to place detergent/clothing in washing machine.  Performed stepping to small step in gym at mod assist with improved standing balance noted today vs yesterday.  He was better able to establish postural control and weight shift for improved step.  Provided pt with large stable chair in front for comfort, however there were times that he would reach for chair.  Ambulated >150' back to room with [name] walker (locked) at max assist with cues for trunk control when stepping, larger step width, and upright posture throughout.  Pt left at EOB with OT present at end of session in prep for bathing/dressing session.    Therapy Documentation Precautions:  Precautions Precautions: Fall Precaution Comments: increased extensor tone in L UE/LE,  impulsive, ataxic Restrictions Weight Bearing Restrictions: No   Pain: Pt c/o pain in shoulders following ambulation, allowed time to rest.    Locomotion : Ambulation Ambulation/Gait Assistance: 2: Max assist   See FIM for current functional status  Therapy/Group: Individual Therapy  [name] 06/21/2013, 11:50 AM

## 2013-06-21 NOTE — Progress Notes (Signed)
Occupational Therapy Session Note  Patient Details  Name: [name] MRN: [id number] Date of Birth: [birth year]  Today's Date: 06/21/2013 Time: 1130-1210 Time Calculation (min): 40 min  Short Term Goals: Week 2:  OT Short Term Goal 1 (Week 2): Patient will perform LB bath to include sit and stand with min assist OT Short Term Goal 2 (Week 2): Patient will perform LB dressing to include sit and stand with min assist OT Short Term Goal 3 (Week 2): Patient will perform toilet transfers with min assist with stand pivot. OT Short Term Goal 4 (Week 2): Patient will place himself independently in the 2 sidelying bed positions he has learned.  Skilled Therapeutic Interventions/Progress Updates:  Patient resting in w/c upon arrival.  Engaged in sit><stands, standing tolerance/balance with weight shifts right and left using RUE for support most of the time and occasionally no support, slow and controlled movements.  Patient reporting pain in shoulder muscles, primarily upper and middle traps therefore performed deep pressure massage with minimal relief and lowered the height of the eva walker one notch to reduce excessive shoulder elevation. Patient requested to ambulate short distance using [name] walker, "still trying to get used to these new body limitations".  Patient with significant tone in left side of body often creating excessive rotational pull.  Patient resting in w/c and all items within reach.  Therapy Documentation Precautions:  Precautions Precautions: Fall Precaution Comments: increased extensor tone in L UE/LE, impulsive, ataxic Restrictions Weight Bearing Restrictions: No Pain: Denies pain  Therapy/Group: Individual Therapy  Anhad Sheeley 06/21/2013, 4:43 PM

## 2013-06-21 NOTE — Progress Notes (Signed)
Social Work Patient ID: [name], male   DOB: 12-Jul-1961, 52 y.o.   MRN: [id number] Met with pt and spoke with wife to inform team conference progression toward goals an discharge still 8/26. Pt is pleased but frustrated with his body not moving the way he wants it to move.  He reports: " It takes me more Time to get my brain to tell my body to move."  He is motivated and has a good attitude regarding his recovery.  Wife to check On the disability issues.  Continue to work on discharge plans and provide support.

## 2013-06-22 ENCOUNTER — Inpatient Hospital Stay (HOSPITAL_COMMUNITY): Payer: Medicare Other | Admitting: Occupational Therapy

## 2013-06-22 NOTE — Progress Notes (Signed)
Patient ID: [name], male   DOB: 02-25-1961, 52 y.o.   MRN: [id number] Subjective/Complaints: 52 y.o. 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 Patient states to be independent prior to admission living with his wife who works day shift. Admitted 06/08/2013 with right-sided weakness. MRI of the brain showed a 1.5 cm region of acute infarction affecting the white matter adjacent to posterior body of the left lateral ventricle with extension into the external capsule region as well as extensive ischemic changes throughout the brain. Carotid Dopplers with less than 40% ICA stenosis. Echocardiogram with ejection fraction of 70% grade 1 diastolic dysfunction. Urine drug screen was negative. Patient did not receive TPA. Neurology services consulted maintained on aspirin therapy for CVA prophylaxis as well as subcutaneous Lovenox for DVT prophylaxis  Slept OK, complains of fatigue during therapy A 12 point review of systems has been performed and if not noted above is otherwise negative.   Objective: Vital Signs: Blood pressure 115/78, pulse 84, temperature 98 F (36.7 C), temperature source Oral, resp. rate 17, height 5' 6.14" (1.68 m), weight 84.142 kg (185 lb 8 oz), SpO2 95.00%. No results found. No results found for this or any previous visit (from the past 72 hour(s)).   HEENT: normal Cardio: tachy and no murmurs Resp: CTA B/L and unlabored GI: BS positive and non tender Extremity:  Pulses positive and No Edema Skin:   Intact Neuro: Alert/Oriented, Cranial Nerve II-XII normal, Abnormal Sensory decreased LUE/LLE LT (distal more so than prox), Abnormal Motor 3-/5 L delt,bi,tri,grip,3+ HF,KE ADF,4/5 on R side  Abnormal FMC Ataxic/ dec FMC and Tone  Hypertonic on L side 2-3/4, Tone:  normal tone on R side and Dysarthric Musc/Skel:  No shoulder pain with overhead activity Gen  NAD   Assessment/Plan: 1. 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 Physiatrist is providing close team supervision and 24 hour management of active medical problems listed below. Physiatrist and rehab team continue to assess barriers to discharge/monitor patient progress toward functional and medical goals. FIM: FIM - Bathing Bathing Steps Patient Completed: Chest;Right Arm;Left Arm;Abdomen;Front perineal area;Buttocks;Right upper leg;Left upper leg;Right lower leg (including foot);Left lower leg (including foot) Bathing: 0: Activity did not occur  FIM - Upper Body Dressing/Undressing Upper body dressing/undressing steps patient completed: Thread/unthread right sleeve of pullover shirt/dresss;Thread/unthread left sleeve of pullover shirt/dress;Put head through opening of pull over shirt/dress;Pull shirt over trunk Upper body dressing/undressing: 0: Activity did not occur FIM - Lower Body Dressing/Undressing Lower body dressing/undressing steps patient completed: Thread/unthread right underwear leg;Thread/unthread left underwear leg;Pull underwear up/down;Thread/unthread right pants leg;Thread/unthread left pants leg;Pull pants up/down;Fasten/unfasten pants;Don/Doff right sock;Don/Doff left sock;Don/Doff right shoe;Don/Doff left shoe Lower body dressing/undressing: 0: Activity did not occur  FIM - Toileting Toileting steps completed by patient: Adjust clothing prior to toileting Toileting Assistive Devices: Toilet Aid/prosthesis/orthosis (urinal in bed) Toileting: 0: Activity did not occur  FIM - Diplomatic Services operational officer Devices: Grab bars;Elevated toilet seat Toilet Transfers: 0-Activity did not occur  FIM - Banker Devices: Bed  rails Bed/Chair Transfer: 0: Activity did not occur  FIM - Locomotion: Wheelchair Distance: 75 Locomotion: Wheelchair: 0: Activity did not occur FIM - Locomotion: Ambulation Locomotion: Ambulation Assistive Devices: Fara Boros Ambulation/Gait Assistance: 2:  Max assist Locomotion: Ambulation: 0: Activity did not occur  Comprehension Comprehension Mode: Auditory Comprehension: 4-Understands basic 75 - 89% of the time/requires cueing 10 - 24% of the time  Expression Expression Mode: Verbal Expression: 4-Expresses basic 75 - 89% of the time/requires cueing 10 - 24% of the time. Needs helper to occlude trach/needs to repeat words.  Social Interaction Social Interaction: 4-Interacts appropriately 75 - 89% of the time - Needs redirection for appropriate language or to initiate interaction.  Problem Solving Problem Solving: 4-Solves basic 75 - 89% of the time/requires cueing 10 - 24% of the time  Memory Memory: 5-Recognizes or recalls 90% of the time/requires cueing < 10% of the time  Medical Problem List and Plan:  1. Thrombotic left periventricular,EC infarct  2. DVT Prophylaxis/Anticoagulation: Subcutaneous Lovenox. Monitor platelet counts and any signs of bleeding  3. Pain Management: Tylenol as needed  4. Neuropsych: This patient is capable of making decisions on his own behalf.   -added trazodone for sleep 5. Hypertension. Presently on Norvasc 10 mg daily. Monitor with increased mobility. Patient also on Tenormin 50 mg daily, hydrochlorothiazide 25 mg daily and lisinopril 40 mg daily prior to admission. --resumed hctz yesterday with some improvement seen  -continue to follow and adjust as needed 6. 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 Latest urine drug screen negative  7. Hyperlipidemia. Zocor  8. 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  -low dose baclofen trial- titrated up 10mg  qid, monitor response   -continue ROM, splinting  with therapy  LOS (Days) 11 A FACE TO FACE EVALUATION WAS PERFORMED  KIRSTEINS,ANDREW E 06/22/2013, 10:23 AM

## 2013-06-23 ENCOUNTER — Inpatient Hospital Stay (HOSPITAL_COMMUNITY): Payer: Medicare Other | Admitting: *Deleted

## 2013-06-23 NOTE — Progress Notes (Signed)
Orthopedic Tech Progress Note Patient Details:  ESTABAN [place] 12-17-1960 [id number]  Patient ID: [name], male   DOB: Oct 11, 1961, 52 y.o.   MRN: [id number]   Shawnie Pons 06/23/2013, 10:30 AMCalled advanced for left resting hand brace.

## 2013-06-23 NOTE — Progress Notes (Signed)
Physical Therapy Session Note  Patient Details  Name: [name] MRN: [id number] Date of Birth: 1960/12/23  Today's Date: 06/23/2013 Time: 0930-1003 Time Calculation (min): 33 min  Skilled Therapeutic Interventions/Progress Updates:  Today's session focused on balance and coordination training in sitting and in standing with special attention to reciprocal movements. Activities performed included weight shifting to facilitate proper weight bearing through b LE, reciprocal movements of UE and LE in sitting, marching in place in sitting and standing (modA).  Gait training with EVA walker  2x 30 feet with max A. W/C propulsion to and from the gym with min A.Transfers in /out of bed with min A.  Therapy Documentation Precautions:  Precautions Precautions: Fall Precaution Comments: increased extensor tone in L UE/LE, impulsive, ataxic Restrictions Weight Bearing Restrictions: No   See FIM for current functional status  Therapy/Group: Individual Therapy  Dorna Mai 06/23/2013, 3:35 PM

## 2013-06-23 NOTE — Progress Notes (Signed)
Patient ID: [name], male   DOB: 1961/02/03, 52 y.o.   MRN: [id number] Subjective/Complaints: 52 y.o. 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 Patient states to be independent prior to admission living with his wife who works day shift. Admitted 06/08/2013 with right-sided weakness. MRI of the brain showed a 1.5 cm region of acute infarction affecting the white matter adjacent to posterior body of the left lateral ventricle with extension into the external capsule region as well as extensive ischemic changes throughout the brain. Carotid Dopplers with less than 40% ICA stenosis. Echocardiogram with ejection fraction of 70% grade 1 diastolic dysfunction. Urine drug screen was negative. Patient did not receive TPA. Neurology services consulted maintained on aspirin therapy for CVA prophylaxis as well as subcutaneous Lovenox for DVT prophylaxis  The patient states his wife would like to speak to medical staff about some questions. We went over my schedule as well as my PA schedule. Also gave my card for my office number  A 12 point review of systems has been performed and if not noted above is otherwise negative.   Objective: Vital Signs: Blood pressure 128/95, pulse 92, temperature 98.3 F (36.8 C), temperature source Oral, resp. rate 18, height 5' 6.14" (1.68 m), weight 84.142 kg (185 lb 8 oz), SpO2 96.00%. No results found. Results for orders placed during the hospital encounter of 06/11/13 (from the past 72 hour(s))  GLUCOSE, CAPILLARY     Status: Abnormal   Collection Time    06/22/13  4:46 PM      Result Value Range   Glucose-Capillary 295 (*) 70 - 99 mg/dL     HEENT: normal Cardio: tachy and no murmurs Resp: CTA B/L and unlabored GI: BS positive and non tender Extremity:  Pulses positive and No Edema Skin:   Intact Neuro: Alert/Oriented, Cranial Nerve II-XII normal,  Abnormal Sensory decreased LUE/LLE LT (distal more so than prox), Abnormal Motor 3-/5 L delt,bi,tri,grip,3+ HF,KE ADF,4/5 on R side  Abnormal FMC Ataxic/ dec FMC and Tone  Hypertonic on L side 2-3/4, Tone:  normal tone on R side and Dysarthric Musc/Skel:  No shoulder pain with overhead activity Gen NAD   Assessment/Plan: 1. 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 Physiatrist is providing close team supervision and 24 hour management of active medical problems listed below. Physiatrist and rehab team continue to assess barriers to discharge/monitor patient progress toward functional and medical goals. FIM: FIM - Bathing Bathing Steps Patient Completed: Chest;Right Arm;Left Arm;Abdomen;Front perineal area;Buttocks;Right upper leg;Left upper leg;Right lower leg (including foot);Left lower leg (including foot) Bathing: 0: Activity did not occur  FIM - Upper Body Dressing/Undressing Upper body dressing/undressing steps patient completed: Thread/unthread right sleeve of pullover shirt/dresss;Thread/unthread left sleeve of pullover shirt/dress;Put head through opening of pull over shirt/dress;Pull shirt over trunk Upper body dressing/undressing: 0: Activity did not occur FIM - Lower Body Dressing/Undressing Lower body dressing/undressing steps patient completed: Thread/unthread right underwear leg;Thread/unthread left underwear leg;Pull underwear up/down;Thread/unthread right pants leg;Thread/unthread left pants leg;Pull pants up/down;Fasten/unfasten pants;Don/Doff right sock;Don/Doff left sock;Don/Doff right shoe;Don/Doff left shoe Lower body dressing/undressing: 0: Activity did not occur  FIM - Toileting Toileting steps completed by patient: Adjust clothing prior to toileting Toileting Assistive Devices:  Toilet Aid/prosthesis/orthosis (urinal in bed) Toileting: 0: Activity  did not occur  FIM - Diplomatic Services operational officer Devices: Grab bars;Elevated toilet seat Toilet Transfers: 0-Activity did not occur  FIM - Banker Devices: Bed rails Bed/Chair Transfer: 0: Activity did not occur  FIM - Locomotion: Wheelchair Distance: 75 Locomotion: Wheelchair: 0: Activity did not occur FIM - Locomotion: Ambulation Locomotion: Ambulation Assistive Devices: Fara Boros Ambulation/Gait Assistance: 2: Max assist Locomotion: Ambulation: 0: Activity did not occur  Comprehension Comprehension Mode: Auditory Comprehension: 5-Follows basic conversation/direction: With no assist  Expression Expression Mode: Verbal Expression: 5-Expresses basic needs/ideas: With no assist  Social Interaction Social Interaction: 5-Interacts appropriately 90% of the time - Needs monitoring or encouragement for participation or interaction.  Problem Solving Problem Solving: 5-Solves basic 90% of the time/requires cueing < 10% of the time  Memory Memory: 5-Recognizes or recalls 90% of the time/requires cueing < 10% of the time  Medical Problem List and Plan:  1. Thrombotic left periventricular,EC infarct  2. DVT Prophylaxis/Anticoagulation: Subcutaneous Lovenox. Monitor platelet counts and any signs of bleeding  3. Pain Management: Tylenol as needed  4. Neuropsych: This patient is capable of making decisions on his own behalf.   -added trazodone for sleep 5. Hypertension. Presently on Norvasc 10 mg daily. Monitor with increased mobility. Patient also on Tenormin 50 mg daily, hydrochlorothiazide 25 mg daily and lisinopril 40 mg daily prior to admission. --resumed hctz yesterday with some improvement seen  -continue to follow and adjust as needed 6. 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 Latest urine drug screen negative  7.  Hyperlipidemia. Zocor  8. 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  -low dose baclofen trial- titrated up 10mg  qid, monitor response   -continue ROM, left resting hand splint ordered  LOS (Days) 12 A FACE TO FACE EVALUATION WAS PERFORMED  KIRSTEINS,ANDREW E 06/23/2013, 9:54 AM

## 2013-06-24 ENCOUNTER — Inpatient Hospital Stay (HOSPITAL_COMMUNITY): Payer: Medicare Other | Admitting: Occupational Therapy

## 2013-06-24 ENCOUNTER — Inpatient Hospital Stay (HOSPITAL_COMMUNITY): Payer: Medicare Other | Admitting: Rehabilitation

## 2013-06-24 DIAGNOSIS — I6330 Cerebral infarction due to thrombosis of unspecified cerebral artery: Secondary | ICD-10-CM

## 2013-06-24 DIAGNOSIS — G8110 Spastic hemiplegia affecting unspecified side: Secondary | ICD-10-CM

## 2013-06-24 MED ORDER — CELECOXIB 200 MG PO CAPS
200.0000 mg | ORAL_CAPSULE | Freq: Two times a day (BID) | ORAL | Status: AC
  Administered 2013-06-24 – 2013-06-30 (×14): 200 mg via ORAL
  Filled 2013-06-24 (×14): qty 1

## 2013-06-24 NOTE — Progress Notes (Signed)
Occupational Therapy Session Note  Patient Details  Name: [name] MRN: [id number] Date of Birth: Jun 05, 1961  Today's Date: 06/24/2013 Time: 0902-1002 Time Calculation (min): 60 min  Short Term Goals: Week 2:  OT Short Term Goal 1 (Week 2): Patient will perform LB bath to include sit and stand with min assist OT Short Term Goal 2 (Week 2): Patient will perform LB dressing to include sit and stand with min assist OT Short Term Goal 3 (Week 2): Patient will perform toilet transfers with min assist with stand pivot. OT Short Term Goal 4 (Week 2): Patient will place himself independently in the 2 sidelying bed positions he has learned.  Skilled Therapeutic Interventions/Progress Updates:    Pt transferred to wheelchair stand pivot with min assist and to the shower seat with min assist as well.  Performed bathing with min assist for balance when standing to perform peri hygiene.  Still with increased flexor tone in the left hand and elbow but can activate out of it to perform bathing using the LUE or to hold the grab bar when standing.  He was able to perform all dressing with min assist as well, including min steady assist with standing to pull pants over his hips.  Required assistance with donning his left shoe this session.  Therapy Documentation Precautions:  Precautions Precautions: Fall Precaution Comments: increased extensor tone in L UE/LE, impulsive, ataxic Restrictions Weight Bearing Restrictions: No  Pain: Pain Assessment Pain Assessment: No/denies pain Pain Score: 0-No pain ADL: See FIM for current functional status  Therapy/Group: Individual Therapy  Dillian Feig OTR/L 06/24/2013, 12:29 PM

## 2013-06-24 NOTE — Progress Notes (Signed)
Physical Therapy Session Note  Patient Details  Name: [name] MRN: [id number] Date of Birth: August 20, 1961  Today's Date: 06/24/2013 Time: 1330-1400 and 1103 -1200 Time Calculation (min): 30 min and 57 mins  Short Term Goals: Week 2:  PT Short Term Goal 1 (Week 2): Pt will perform transfers at min assist level. PT Short Term Goal 2 (Week 2): Pt will perform dynamic sitting balance at supervision level PT Short Term Goal 3 (Week 2): Pt will ambulate x 39' with LRAD at max assist PT Short Term Goal 4 (Week 2): Pt will perform dynamic standing balance activities at min assist.   Skilled Therapeutic Interventions/Progress Updates:   AM session: Focus of session was repeated sit <> stand without UE support, dynamic sitting balance/trunk control, and gait training.  Sit <> stand x 10 reps with focus on equal WB through BLEs, without use of UEs (had him hold them together while standing), attaining upright posture and controlling descent without use of UEs.  Performed dynamic sitting balance with feet unsupported hitting ball from varying targeted areas.  Pt able to perform at supervision level with one LOB to R elbow.  Gait training with [name] walker x 45' with verbal cues and facilitation for adequate weight shifts, increased step lengths (L>R), wider step on R, upright posture and trunk control with [name] walker.  Requires max assist to keep upright due to increased forward translation with [name] walker.  Pt left in room in w/c with call [name] and phone in place.    PM session:  Focus of session was gait training with RW.  Ambulated 60' x 1 and 85' x 1 with RW at moderate assist with max cues for slowest gait speed for increased focus on increasing stride length, upright posture, maintaining position inside of RW, and wider step on RLE.  Pt did very well, however pt states he is not that excited about improvements.  At end of session, performed hip flex stretch to BLEs while seated on hi/low mat with feet  unsupported. Pt returned to room in w/c with call [name] and phone in reach.    Therapy Documentation Precautions:  Precautions Precautions: Fall Precaution Comments: increased extensor tone in L UE/LE, impulsive, ataxic Restrictions Weight Bearing Restrictions: No   Pain: Pain Assessment Pain Assessment: 0-10 Pain Score: 0-No pain Pain Type: Acute pain   See FIM for current functional status  Therapy/Group: Individual Therapy  [name] 06/24/2013, 2:56 PM

## 2013-06-24 NOTE — Progress Notes (Signed)
Occupational Therapy Session Note  Patient Details  Name: [name] MRN: [id number] Date of Birth: 13-Nov-1961  Today's Date: 06/24/2013 Time: 4098-1191 Time Calculation (min): 54 min  Skilled Therapeutic Interventions/Progress Updates:    Pt began session working on squat pivot transfers from bed to wheelchair with min assist.  Pt demonstrates increased difficulty with coordinating squat pivot transfers, especially to the left.  Pt tends to try and stand instead of just pivoting.  Progressed to sit to stand and worked on standing balance.  Had pt work on LLE stance phase while stepping with the right.  Transitioned to taking steps with therapist providing max facilitation.  Pt demonstrates increased tone in the LLE resulting in decreased ability to maintain trunk extension and knee extension.  Pt demonstrates increased trunk flexion instead.  Educated pt on scapular exercises in sitting to help decrease left shoulder pain at end of session.  Also encouraged the use of heat.    Therapy Documentation Precautions:  Precautions Precautions: Fall Precaution Comments: increased extensor tone in L UE/LE, impulsive, ataxic Restrictions Weight Bearing Restrictions: No  Pain: Pain Assessment Pain Assessment: 0-10 Pain Score: 5  Pain Type: Acute pain Pain Location: Shoulder Pain Orientation: Right Pain Intervention(s): Repositioned ADL: See FIM for current functional status  Therapy/Group: Individual Therapy  Gwendloyn Forsee OTR/L 06/24/2013, 3:50 PM

## 2013-06-24 NOTE — Progress Notes (Signed)
Patient ID: [name], male   DOB: 01/22/1961, 52 y.o.   MRN: [id number] Subjective/Complaints: 52 y.o. 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 Patient states to be independent prior to admission living with his wife who works day shift. Admitted 06/08/2013 with right-sided weakness. MRI of the brain showed a 1.5 cm region of acute infarction affecting the white matter adjacent to posterior body of the left lateral ventricle with extension into the external capsule region as well as extensive ischemic changes throughout the brain. Carotid Dopplers with less than 40% ICA stenosis. Echocardiogram with ejection fraction of 70% grade 1 diastolic dysfunction. Urine drug screen was negative. Patient did not receive TPA. Neurology services consulted maintained on aspirin therapy for CVA prophylaxis as well as subcutaneous Lovenox for DVT prophylaxis  No new issues overnight. Spasms well controlled. Complains of right shoulder pain increasing. No falls or trauma to that area. No previous surgeries to that area.  A 12 point review of systems has been performed and if not noted above is otherwise negative.   Objective: Vital Signs: Blood pressure 129/90, pulse 88, temperature 98.1 F (36.7 C), temperature source Oral, resp. rate 20, height 5' 6.14" (1.68 m), weight 84.142 kg (185 lb 8 oz), SpO2 95.00%. No results found. Results for orders placed during the hospital encounter of 06/11/13 (from the past 72 hour(s))  GLUCOSE, CAPILLARY     Status: Abnormal   Collection Time    06/22/13  4:46 PM      Result Value Range   Glucose-Capillary 295 (*) 70 - 99 mg/dL     HEENT: normal Cardio: tachy and no murmurs Resp: CTA B/L and unlabored GI: BS positive and non tender Extremity:  Pulses positive and No Edema Skin:   Intact Neuro: Alert/Oriented, Cranial Nerve II-XII normal, Abnormal Sensory  decreased LUE/LLE LT (distal more so than prox), Abnormal Motor 3-/5 L delt,bi,tri,grip,3+ HF,KE ADF,4/5 on R side  Abnormal FMC Ataxic/ dec FMC and Tone  Hypertonic on L side 2-3/4, Tone:  normal tone on R side and Dysarthric Musc/Skel:  R shoulder pain with overhead activity, + impingement signs Gen NAD   Assessment/Plan: 1. 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 Physiatrist is providing close team supervision and 24 hour management of active medical problems listed below. Physiatrist and rehab team continue to assess barriers to discharge/monitor patient progress toward functional and medical goals. FIM: FIM - Bathing Bathing Steps Patient Completed: Chest;Right Arm;Left Arm;Abdomen;Front perineal area;Buttocks;Right upper leg;Left upper leg;Right lower leg (including foot);Left lower leg (including foot) Bathing: 0: Activity did not occur  FIM - Upper Body Dressing/Undressing Upper body dressing/undressing steps patient completed: Thread/unthread right sleeve of pullover shirt/dresss;Thread/unthread left sleeve of pullover shirt/dress;Put head through opening of pull over shirt/dress;Pull shirt over trunk Upper body dressing/undressing: 0: Activity did not occur FIM - Lower Body Dressing/Undressing Lower body dressing/undressing steps patient completed: Thread/unthread right underwear leg;Thread/unthread left underwear leg;Pull underwear up/down;Thread/unthread right pants leg;Thread/unthread left pants leg;Pull pants up/down;Fasten/unfasten pants;Don/Doff right sock;Don/Doff left sock;Don/Doff right shoe;Don/Doff left shoe Lower body dressing/undressing: 0: Activity did not occur  FIM - Toileting Toileting steps completed by patient: Adjust clothing prior to toileting;Performs perineal  hygiene Toileting Assistive Devices: Grab bar or rail for support Toileting: 3: Mod-Patient completed 2  of 3 steps  FIM - Diplomatic Services operational officer Devices: Grab bars Toilet Transfers: 4-To toilet/BSC: Min A (steadying Pt. > 75%);4-From toilet/BSC: Min A (steadying Pt. > 75%)  FIM - Bed/Chair Transfer Bed/Chair Transfer Assistive Devices: Bed rails Bed/Chair Transfer: 4: Bed > Chair or W/C: Min A (steadying Pt. > 75%);4: Chair or W/C > Bed: Min A (steadying Pt. > 75%)  FIM - Locomotion: Wheelchair Distance: 75 Locomotion: Wheelchair: 1: Travels less than 50 ft with minimal assistance (Pt.>75%) FIM - Locomotion: Ambulation Locomotion: Ambulation Assistive Devices: Fara Boros Ambulation/Gait Assistance: 2: Max assist Locomotion: Ambulation: 1: Travels less than 50 ft with maximal assistance (Pt: 25 - 49%)  Comprehension Comprehension Mode: Auditory Comprehension: 5-Follows basic conversation/direction: With no assist  Expression Expression Mode: Verbal Expression: 5-Expresses basic needs/ideas: With no assist  Social Interaction Social Interaction: 5-Interacts appropriately 90% of the time - Needs monitoring or encouragement for participation or interaction.  Problem Solving Problem Solving: 5-Solves basic 90% of the time/requires cueing < 10% of the time  Memory Memory: 5-Recognizes or recalls 90% of the time/requires cueing < 10% of the time  Medical Problem List and Plan:  1. Thrombotic left periventricular,EC infarct  2. DVT Prophylaxis/Anticoagulation: Subcutaneous Lovenox. Monitor platelet counts and any signs of bleeding  3. Pain Management: Tylenol as needed  4. Neuropsych: This patient is capable of making decisions on his own behalf.   -added trazodone for sleep 5. Hypertension. Presently on Norvasc 10 mg daily. Monitor with increased mobility. Patient also on Tenormin 50 mg daily, hydrochlorothiazide 25 mg daily and lisinopril 40 mg daily prior  to admission. --resumed hctz yesterday with some improvement seen  -continue to follow and adjust as needed 6. 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 Latest urine drug screen negative  7. Hyperlipidemia. Zocor  8. 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  -low dose baclofen trial- titrated up 10mg  qid, monitor response   -continue ROM, left resting hand splint ordered Will 9. Postop shoulder pain right side. Suspect subacromial impingement syndrome. Continue OT/PT, start Celebrex  LOS (Days) 13 A FACE TO FACE EVALUATION WAS PERFORMED  KIRSTEINS,ANDREW E 06/24/2013, 9:45 AM

## 2013-06-25 ENCOUNTER — Inpatient Hospital Stay (HOSPITAL_COMMUNITY): Payer: Medicare Other | Admitting: Physical Therapy

## 2013-06-25 ENCOUNTER — Inpatient Hospital Stay (HOSPITAL_COMMUNITY): Payer: Medicare Other | Admitting: Occupational Therapy

## 2013-06-25 ENCOUNTER — Inpatient Hospital Stay (HOSPITAL_COMMUNITY): Payer: Medicare Other | Admitting: *Deleted

## 2013-06-25 DIAGNOSIS — G8110 Spastic hemiplegia affecting unspecified side: Secondary | ICD-10-CM

## 2013-06-25 DIAGNOSIS — I6330 Cerebral infarction due to thrombosis of unspecified cerebral artery: Secondary | ICD-10-CM

## 2013-06-25 NOTE — Progress Notes (Addendum)
Physical Therapy Session Note  Patient Details  Name: [name] MRN: [id number] Date of Birth: 04-14-1961  Today's Date: 06/25/2013 Time: 0830-0900 Time Calculation (min): 30 min  Short Term Goals: Week 2:  PT Short Term Goal 1 (Week 2): Pt will perform transfers at min assist level. PT Short Term Goal 2 (Week 2): Pt will perform dynamic sitting balance at supervision level PT Short Term Goal 3 (Week 2): Pt will ambulate x 96' with LRAD at max assist PT Short Term Goal 4 (Week 2): Pt will perform dynamic standing balance activities at min assist.   Skilled Therapeutic Interventions/Progress Updates:    Patient received supine in bed. Session focused on functional transfers and standing balance while addressing self-care tasks. Patient supine>sit with HOB flat and use of bed rail with min A. Sitting edge of bed, patient donned socks and shoes. Requires assist to maintain LE across lap when donning socks; requires assist for adjustment of shoe around heel when donning shoes; intermittent minA required for sitting balance. Patient transfers bed>wheelchair via stand pivot with use of arm rests with minA. Patient stood at sink brush teeth and requires min-modA for standing balance, increased assist when reaching for objects (toothbrush, toothpaste, etc.). Sitting in wheelchair, patient doffs shirt and dons new shirt with only verbal cues when patient attempts to don shirt backwards. Patient with requests to use bathroom. Patient transfers wheelchair>toilet with use of armrests and grab bars and minA. Patient able to manage clothing without assistance, minA provided for standing balance. Patient left seated on toilet as OT presents in room for therapy session.  Therapy Documentation Precautions:  Precautions Precautions: Fall Precaution Comments: increased extensor tone in L UE/LE, impulsive, ataxic Restrictions Weight Bearing Restrictions: No Pain: Pain Assessment Pain Assessment: No/denies  pain Pain Score: 0-No pain Locomotion : Ambulation Ambulation/Gait Assistance: Not tested (comment)   See FIM for current functional status  Therapy/Group: Individual Therapy  Chipper Herb. Dashley Monts, PT, DPT 06/25/2013, 9:20 AM

## 2013-06-25 NOTE — Progress Notes (Signed)
Occupational Therapy Session Note  Patient Details  Name: [name] MRN: [id number] Date of Birth: 07/19/1961  Today's Date: 06/25/2013 Time: 0903-1000 Time Calculation (min): 57 min  Short Term Goals: Week 2:  OT Short Term Goal 1 (Week 2): Patient will perform LB bath to include sit and stand with min assist OT Short Term Goal 2 (Week 2): Patient will perform LB dressing to include sit and stand with min assist OT Short Term Goal 3 (Week 2): Patient will perform toilet transfers with min assist with stand pivot. OT Short Term Goal 4 (Week 2): Patient will place himself independently in the 2 sidelying bed positions he has learned.  Skilled Therapeutic Interventions/Progress Updates:    Pt performed toileting and toilet transfer with min assist to start session.  Had him use the grab bars and min assist to step over to the walk-in shower from the toilet.  Provided long handle sponge for pt to use as he demonstrates increased difficulty reaching his feet and his right buttocks and back.  Pt overall min guard assist for showering sit to stand.  Ambulated out to the wheelchair in front of the sink for dressing tasks.  Pt needing min instructional cueing for setup of LLE prior to standing.  Pt tends to leave it too far out in front of him and rely on the RLE to assist more with transitional movements.    Therapy Documentation Precautions:  Precautions Precautions: Fall Precaution Comments: increased extensor tone in L UE/LE, impulsive, ataxic Restrictions Weight Bearing Restrictions: No  Pain: Pain Assessment Pain Assessment: No/denies pain Pain Score: 0-No pain ADL: See FIM for current functional status  Therapy/Group: Individual Therapy  Arminta Gamm OTR/L 06/25/2013, 12:15 PM

## 2013-06-25 NOTE — Progress Notes (Signed)
Patient ID: [name], male   DOB: [birth year], 52 y.o.   MRN: [id number] Subjective/Complaints: 52 y.o. 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 Patient states to be independent prior to admission living with his wife who works day shift. Admitted 06/08/2013 with right-sided weakness. MRI of the brain showed a 1.5 cm region of acute infarction affecting the white matter adjacent to posterior body of the left lateral ventricle with extension into the external capsule region as well as extensive ischemic changes throughout the brain. Carotid Dopplers with less than 40% ICA stenosis. Echocardiogram with ejection fraction of 70% grade 1 diastolic dysfunction. Urine drug screen was negative. Patient did not receive TPA. Neurology services consulted maintained on aspirin therapy for CVA prophylaxis as well as subcutaneous Lovenox for DVT prophylaxis  No new issues overnight. Spasms well controlled. right shoulder pain improved. On Celebrex A 12 point review of systems has been performed and if not noted above is otherwise negative.   Objective: Vital Signs: Blood pressure 131/86, pulse 88, temperature 98.1 F (36.7 C), temperature source Oral, resp. rate 20, height 5' 6.14" (1.68 m), weight 84.142 kg (185 lb 8 oz), SpO2 95.00%. No results found. Results for orders placed during the hospital encounter of 06/11/13 (from the past 72 hour(s))  GLUCOSE, CAPILLARY     Status: Abnormal   Collection Time    06/22/13  4:46 PM      Result Value Range   Glucose-Capillary 295 (*) 70 - 99 mg/dL     HEENT: normal Cardio: tachy and no murmurs Resp: CTA B/L and unlabored GI: BS positive and non tender Extremity:  Pulses positive and No Edema Skin:   Intact Neuro: Alert/Oriented, Cranial Nerve II-XII normal, Abnormal Sensory decreased LUE/LLE LT (distal more so than prox), Abnormal Motor 3-/5 L  delt,bi,tri,grip,3+ HF,KE ADF,4/5 on R side  Abnormal FMC Ataxic/ dec FMC and Tone  Hypertonic on L side 2-3/4, Tone:  normal tone on R side and Dysarthric Musc/Skel:  No R shoulder pain with overhead activity, - impingement signs Gen NAD   Assessment/Plan: 1. 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 Physiatrist is providing close team supervision and 24 hour management of active medical problems listed below. Physiatrist and rehab team continue to assess barriers to discharge/monitor patient progress toward functional and medical goals. FIM: FIM - Bathing Bathing Steps Patient Completed: Chest;Right Arm;Left Arm;Abdomen;Front perineal area;Buttocks;Right upper leg;Left upper leg Bathing: 4: Steadying assist  FIM - Upper Body Dressing/Undressing Upper body dressing/undressing steps patient completed: Thread/unthread right sleeve of pullover shirt/dresss;Thread/unthread left sleeve of pullover shirt/dress;Put head through opening of pull over shirt/dress;Pull shirt over trunk Upper body dressing/undressing: 5: Supervision: Safety issues/verbal cues FIM - Lower Body Dressing/Undressing Lower body dressing/undressing steps patient completed: Thread/unthread right pants leg;Thread/unthread left pants leg;Pull pants up/down;Don/Doff right shoe;Thread/unthread right underwear leg;Thread/unthread left underwear leg;Pull underwear up/down Lower body dressing/undressing: 3: Mod-Patient completed 50-74% of tasks  FIM - Toileting Toileting steps completed by patient: Adjust clothing prior to toileting;Performs perineal hygiene Toileting Assistive Devices: Grab bar or rail for support Toileting: 3: Mod-Patient completed 2 of 3 steps  FIM - Diplomatic Services operational officer Devices: Grab bars Toilet  Transfers: 4-To toilet/BSC: Min A (steadying Pt. > 75%);4-From toilet/BSC: Min A (steadying Pt. >  75%)  FIM - Bed/Chair Transfer Bed/Chair Transfer Assistive Devices: Arm rests Bed/Chair Transfer: 4: Bed > Chair or W/C: Min A (steadying Pt. > 75%)  FIM - Locomotion: Wheelchair Distance: 75 Locomotion: Wheelchair: 2: Travels 50 - 149 ft with supervision, cueing or coaxing FIM - Locomotion: Ambulation Locomotion: Ambulation Assistive Devices: Designer, industrial/product Ambulation/Gait Assistance: 3: Mod assist Locomotion: Ambulation: 2: Travels 50 - 149 ft with moderate assistance (Pt: 50 - 74%)  Comprehension Comprehension Mode: Auditory Comprehension: 5-Follows basic conversation/direction: With no assist  Expression Expression Mode: Verbal Expression: 5-Expresses basic needs/ideas: With no assist  Social Interaction Social Interaction: 5-Interacts appropriately 90% of the time - Needs monitoring or encouragement for participation or interaction.  Problem Solving Problem Solving: 5-Solves complex 90% of the time/cues < 10% of the time  Memory Memory: 5-Recognizes or recalls 90% of the time/requires cueing < 10% of the time  Medical Problem List and Plan:  1. Thrombotic left periventricular,EC infarct  2. DVT Prophylaxis/Anticoagulation: Subcutaneous Lovenox. Monitor platelet counts and any signs of bleeding  3. Pain Management: Tylenol as needed  4. Neuropsych: This patient is capable of making decisions on his own behalf.   -added trazodone for sleep 5. Hypertension. Presently on Norvasc 10 mg daily. Monitor with increased mobility. Patient also on Tenormin 50 mg daily, hydrochlorothiazide 25 mg daily and lisinopril 40 mg daily prior to admission. --resumed hctz yesterday with some improvement seen  -continue to follow and adjust as needed 6. 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 Latest urine drug screen negative  7. Hyperlipidemia. Zocor  8.  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  -low dose baclofen trial- titrated up 10mg  qid, monitor response   -continue ROM, left resting hand splint ordered Will 9. Postop shoulder pain right side. Suspect subacromial impingement syndrome. Continue OT/PT, Celebrex for 7 days  LOS (Days) 14 A FACE TO FACE EVALUATION WAS PERFORMED  Marceline Napierala E 06/25/2013, 8:43 AM

## 2013-06-25 NOTE — Progress Notes (Signed)
Occupational Therapy Session Note  Patient Details  Name: [name] MRN: [id number] Date of Birth: 1961/05/08  Today's Date: 06/25/2013 Time: 8295-6213 Time Calculation (min): 60 min  Short Term Goals: Week 2:  OT Short Term Goal 1 (Week 2): Patient will perform LB bath to include sit and stand with min assist OT Short Term Goal 2 (Week 2): Patient will perform LB dressing to include sit and stand with min assist OT Short Term Goal 3 (Week 2): Patient will perform toilet transfers with min assist with stand pivot. OT Short Term Goal 4 (Week 2): Patient will place himself independently in the 2 sidelying bed positions he has learned.  Skilled Therapeutic Interventions/Progress Updates:  Patient sleeping in bed upon arrival and demonstrates difficulty waking up and stating that he just did not feel like himself and felt very lethargic. Patient able to perform supine to right sidely position with use of rails and no cues yet he required min assist to position himself in a left sidely position to rotate his trunk, keep left scapula on the bed, right hip stacked on top of left hip and left arm in ~70 degrees of flexion and lying on the bed.  Patient able to demonstrate supine>left sidely>sit EOB without assist yet reports tiresome.  Patient required 5 trial of positioning his left elbow onto the bed in a position that would prop him up then assist with the upright posture.  Challenged patient to position the elbow in optimal position the first time!  Patient donned his shoes then practiced sit><stands 2 times with slow and controlled movements except fell back the second time onto the bed without a righting reaction.  Ambulated with HHA to the sink to wash hands and face,  Ambulated with RW short distance then back to bed.  Patient continued to say, "why am I so tired?"  Encouraged him to talk to MD in the AM and assisted him in writing down some questions because he states that he can't remember what  he wants to say when the MD comes in the AM.  Patient was left resting in bed with bed alarm and all items within reach.  Therapy Documentation Precautions:  Precautions Precautions: Fall Precaution Comments: increased extensor tone in L UE/LE, impulsive, ataxic Restrictions Weight Bearing Restrictions: No Pain: Pain Assessment Pain Assessment: No/denies pain ADL: See FIM for current functional status  Therapy/Group: Individual Therapy  Sharron Simpson 06/25/2013, 3:57 PM

## 2013-06-25 NOTE — Progress Notes (Signed)
Physical Therapy Session Note  Patient Details  Name: [name] MRN: [id number] Date of Birth: 04-01-1961  Today's Date: 06/25/2013 Time: 1000-1100 Time Calculation (min): 60 min  Short Term Goals: Week 2:  PT Short Term Goal 1 (Week 2): Pt will perform transfers at min assist level. PT Short Term Goal 2 (Week 2): Pt will perform dynamic sitting balance at supervision level PT Short Term Goal 3 (Week 2): Pt will ambulate x 83' with LRAD at max assist PT Short Term Goal 4 (Week 2): Pt will perform dynamic standing balance activities at min assist.   Skilled Therapeutic Interventions/Progress Updates:    Pt received sitting in wheelchair. Pt performed wheelchair mobility 45 feet with min assist and requires increased time to complete difficulty maintaining straight path and coordinating  R UE and LEs.  Pt performed gait training with RW with mod assist and max vcs for B LEs advancement, proper placement and advancement of walker.  Pt requires facilitation to improve weight shifting > L LE and to decrease step length as pt tends to leave L LE too far behind with walker too far in front posing fall risk.  Sitting and standing dynamic balance activities with pt displaying mild L knee buckle with increased difficulty. Transfer training w/c <>mat, sit to stand and toilet transfers with pt requiring min  assist.  Therapy Documentation Precautions:  Precautions Precautions: Fall Precaution Comments: increased extensor tone in L UE/LE, impulsive, ataxic Restrictions Weight Bearing Restrictions: No       Pain: Pain Assessment Pain Assessment: No/denies pain    Locomotion : Ambulation Ambulation/Gait Assistance: 3: Mod assist                See FIM for current functional status  Therapy/Group: Individual Therapy  Jackelyn Knife 06/25/2013, 3:22 PM

## 2013-06-26 ENCOUNTER — Inpatient Hospital Stay (HOSPITAL_COMMUNITY): Payer: Medicare Other | Admitting: Occupational Therapy

## 2013-06-26 ENCOUNTER — Inpatient Hospital Stay (HOSPITAL_COMMUNITY): Payer: Medicare Other | Admitting: Physical Therapy

## 2013-06-26 NOTE — Progress Notes (Signed)
Physical Therapy Session Note  Patient Details  Name: [name] MRN: [id number] Date of Birth: 09/05/1961  Today's Date: 06/26/2013 Time: 1100-1200 Time Calculation (min): 60 min  Short Term Goals: Week 2:  PT Short Term Goal 1 (Week 2): Pt will perform transfers at min assist level. PT Short Term Goal 2 (Week 2): Pt will perform dynamic sitting balance at supervision level PT Short Term Goal 3 (Week 2): Pt will ambulate x 45' with LRAD at max assist PT Short Term Goal 4 (Week 2): Pt will perform dynamic standing balance activities at min assist.   Skilled Therapeutic Interventions/Progress Updates:    Pt performed wheelchair mobility 100 feet with min assist, pt able to better propel with use of B LEs only and uses R UE to turn and change direction.  Pt performed gait training with RW 85 feet with mod assist with pt requiring assistance to keep RW steady and to keep RW from getting too far forward. Increased difficulty with advancement of L LE with fatigue and if walker too far forward.  Supine B LE stretches and strengthening with therapist assist for stretches, mod to max tightness noted in B LEs L > R, and L displays increased tone. Improved mobility of B LEs after supine activities. Standing balance with one hand supported by walker and min assist for reaching and crossing midline with both UEs, increased difficulty with L UE from due to decreased grasp and strength. Pt very motivated and pleasant.   Therapy Documentation Precautions:  Precautions Precautions: Fall Precaution Comments: increased extensor tone in L UE/LE, impulsive, ataxic Restrictions Weight Bearing Restrictions: No       Pain: No pain/ denies pain      Locomotion : Ambulation Ambulation/Gait Assistance: 3: Mod assist Wheelchair Mobility Distance: 100            See FIM for current functional status  Therapy/Group: Individual Therapy  Jackelyn Knife 06/26/2013, 2:11 PM

## 2013-06-26 NOTE — Progress Notes (Signed)
Physical Therapy Note  Patient Details  Name: DAELIN HASTE MRN: [id number] Date of Birth: [birth year] Today's Date: 06/26/2013  1520-1600 (40 minutes) individual Pain: no reported pain Focus of treatment: Therapeutic exercises focused on bilateral LE coordination in supine /standing Treatment: Transfers stand/turn RW min assist; sit >< supine SBA (mat); supine heel to knee , heel to shin coordination exercises; standing alternate stepping to 4 inch step mod assist for balance with increased right lean during stepping on left.    Jaiveon Suppes,JIM 06/26/2013, 4:38 PM

## 2013-06-26 NOTE — Progress Notes (Signed)
Patient ID: [name], male   DOB: 01-23-1961, 52 y.o.   MRN: [id number] Subjective/Complaints: 52 y.o. 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 Patient states to be independent prior to admission living with his wife who works day shift. Admitted 06/08/2013 with right-sided weakness. MRI of the brain showed a 1.5 cm region of acute infarction affecting the white matter adjacent to posterior body of the left lateral ventricle with extension into the external capsule region as well as extensive ischemic changes throughout the brain. Carotid Dopplers with less than 40% ICA stenosis. Echocardiogram with ejection fraction of 70% grade 1 diastolic dysfunction. Urine drug screen was negative. Patient did not receive TPA. Neurology services consulted maintained on aspirin therapy for CVA prophylaxis as well as subcutaneous Lovenox for DVT prophylaxis  No new issues overnight. Spasms well controlled. right shoulder pain improved. On Celebrex Wife with multiple questions regarding stroke etiology as well as recovery A 12 point review of systems has been performed and if not noted above is otherwise negative.   Objective: Vital Signs: Blood pressure 119/85, pulse 65, temperature 98.4 F (36.9 C), temperature source Oral, resp. rate 18, height 5' 6.14" (1.68 m), weight 81.965 kg (180 lb 11.2 oz), SpO2 100.00%. No results found. No results found for this or any previous visit (from the past 72 hour(s)).   HEENT: normal Cardio: tachy and no murmurs Resp: CTA B/L and unlabored GI: BS positive and non tender Extremity:  Pulses positive and No Edema Skin:   Intact Neuro: Alert/Oriented, Cranial Nerve II-XII normal, Abnormal Sensory decreased LUE/LLE LT (distal more so than prox), Abnormal Motor 3-/5 L delt,bi,tri,grip,3+ HF,KE ADF,4/5 on R side  Abnormal FMC Ataxic/ dec FMC and Tone  Hypertonic on  L side 2-3/4, Tone:  normal tone on R side and Dysarthric Musc/Skel:  No R shoulder pain with overhead activity, - impingement signs Gen NAD   Assessment/Plan: 1. 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 Physiatrist is providing close team supervision and 24 hour management of active medical problems listed below. Physiatrist and rehab team continue to assess barriers to discharge/monitor patient progress toward functional and medical goals. FIM: FIM - Bathing Bathing Steps Patient Completed: Chest;Right Arm;Left Arm;Abdomen;Front perineal area;Buttocks;Right upper leg;Left upper leg;Right lower leg (including foot);Left lower leg (including foot) Bathing: 4: Steadying assist  FIM - Upper Body Dressing/Undressing Upper body dressing/undressing steps patient completed: Thread/unthread right sleeve of pullover shirt/dresss;Thread/unthread left sleeve of pullover shirt/dress;Put head through opening of pull over shirt/dress;Pull shirt over trunk Upper body dressing/undressing: 5: Supervision: Safety issues/verbal cues FIM - Lower Body Dressing/Undressing Lower body dressing/undressing steps patient completed: Thread/unthread right underwear leg;Thread/unthread left underwear leg;Pull underwear up/down;Thread/unthread right pants leg;Thread/unthread left pants leg;Pull pants up/down;Don/Doff right shoe;Don/Doff left shoe Lower body dressing/undressing: 4: Min-Patient completed 75 plus % of tasks  FIM - Toileting Toileting steps completed by patient: Adjust clothing prior to toileting;Performs perineal hygiene;Adjust clothing after toileting Toileting Assistive Devices: Grab bar or rail for support Toileting: 4: Steadying assist  FIM - Diplomatic Services operational officer Devices: Grab bars Toilet  Transfers: 4-To toilet/BSC: Min A (steadying Pt. > 75%);4-From toilet/BSC: Min A (steadying Pt. > 75%)  FIM - Bed/Chair Transfer Bed/Chair Transfer Assistive Devices: Arm rests Bed/Chair Transfer: 4: Bed >  Chair or W/C: Min A (steadying Pt. > 75%);4: Chair or W/C > Bed: Min A (steadying Pt. > 75%)  FIM - Locomotion: Wheelchair Distance: 75 Locomotion: Wheelchair: 2: Travels 50 - 149 ft with minimal assistance (Pt.>75%) FIM - Locomotion: Ambulation Locomotion: Ambulation Assistive Devices: Designer, industrial/product Ambulation/Gait Assistance: 3: Mod assist Locomotion: Ambulation: 2: Travels 50 - 149 ft with moderate assistance (Pt: 50 - 74%)  Comprehension Comprehension Mode: Auditory Comprehension: 5-Understands basic 90% of the time/requires cueing < 10% of the time  Expression Expression Mode: Verbal Expression: 5-Expresses basic needs/ideas: With extra time/assistive device  Social Interaction Social Interaction: 5-Interacts appropriately 90% of the time - Needs monitoring or encouragement for participation or interaction.  Problem Solving Problem Solving: 5-Solves basic 90% of the time/requires cueing < 10% of the time  Memory Memory: 5-Recognizes or recalls 90% of the time/requires cueing < 10% of the time  Medical Problem List and Plan:  1. Thrombotic left periventricular,EC infarct  2. DVT Prophylaxis/Anticoagulation: Subcutaneous Lovenox. Monitor platelet counts and any signs of bleeding  3. Pain Management: Tylenol as needed  4. Neuropsych: This patient is capable of making decisions on his own behalf.   -added trazodone for sleep 5. Hypertension. Presently on Norvasc 10 mg daily. Monitor with increased mobility. Patient also on Tenormin 50 mg daily, hydrochlorothiazide 25 mg daily and lisinopril 40 mg daily prior to admission. --resumed hctz yesterday with some improvement seen  -continue to follow and adjust as needed 6. History of ICH x2 secondary cocaine with residual  left-sided weakness. Latest urine drug screen negative  7. Hyperlipidemia. Zocor  8. 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  -low dose baclofen trial- titrated up 10mg  qid, monitor response   -continue ROM, left resting hand splint ordered Will 9. Postop shoulder pain right side. Suspect subacromial impingement syndrome. Continue OT/PT, Celebrex for 7 days  LOS (Days) 15 A FACE TO FACE EVALUATION WAS PERFORMED  Reyden Smith E 06/26/2013, 10:24 AM

## 2013-06-26 NOTE — Progress Notes (Signed)
Occupational Therapy Session Note  Patient Details  Name: [name] MRN: [id number] Date of Birth: 1961-10-13  Today's Date: 06/26/2013 Time: 4098-1191 Time Calculation (min): 49 min  Short Term Goals: Week 2:  OT Short Term Goal 1 (Week 2): Patient will perform LB bath to include sit and stand with min assist OT Short Term Goal 2 (Week 2): Patient will perform LB dressing to include sit and stand with min assist OT Short Term Goal 3 (Week 2): Patient will perform toilet transfers with min assist with stand pivot. OT Short Term Goal 4 (Week 2): Patient will place himself independently in the 2 sidelying bed positions he has learned.  Skilled Therapeutic Interventions/Progress Updates:    Pt began session with pt performing toilet transfer and toileting with min assist for stand pivot transfer.  Increased tone noted in the LUE which increases with sit to stand and attempted transferring.  Pt performed bathing at shower level with min assist and dressing also with min assist.  Emphasis on normal movement patterns with sit to stand as well as standing balance when attempting peri washing and pulling garments over his hips.  Pt still tends to use extensor patterns and decreased forward trunk flexion for sit to stand transitions.    Therapy Documentation Precautions:  Precautions Precautions: Fall Precaution Comments: increased extensor tone in L UE/LE, impulsive, ataxic Restrictions Weight Bearing Restrictions: No  Pain: Pain Assessment Pain Assessment: No/denies pain ADL: See FIM for current functional status  Therapy/Group: Individual Therapy  Reese Stockman OTR/L 06/26/2013, 10:56 AM

## 2013-06-26 NOTE — Progress Notes (Signed)
Occupational Therapy Weekly Progress Note  Patient Details  Name: [name] MRN: [id number] Date of Birth: Jun 19, 1961  Today's Date: 06/26/2013 Time: 1191-4782 Time Calculation (min): 51 min  Patient has met 3 of 4 short term goals.  [name] continues to need min assist for functional transfers related to toileting and showering.  He is able to perform bathing and dressing with min assist for sit to stand.  He currently is still limited by the LUE and LLE tone in addition to the ataxic movement of the left UE and LE as well.  Family plans to participate in OT sessions soon in order to help him with functional transfers and toileting.  Continues to progress with goals and feel he can reach the established supervision level for discharge expected on 07/09/13.    Patient continues to demonstrate the following deficits: decreased RUE and LUE functional use and coordination, LUE and LE tone, decreased balance, and therefore will continue to benefit from skilled OT intervention to enhance overall performance with BADL.  Patient progressing toward long term goals..  Continue plan of care.  OT Short Term Goals Week 3:  OT Short Term Goal 1 (Week 3): Pt will perform bathing sit to stand in the shower with AE and supervison. OT Short Term Goal 2 (Week 3): Pt will peform dressing sit to stand at the sink with supervision. OT Short Term Goal 3 (Week 3): Pt will perform toilet transfers stand/squat pivot with supervision. OT Short Term Goal 4 (Week 3): Pt will perform shower transfers with close supervision  Skilled Therapeutic Interventions/Progress Updates:    During session worked on toilet transfers and toileting while wife was present.  Pt able to perform transfers with min assist including clothing management and hygiene.  Educated wife on assisting pt with toilet transfers as well.  She is able to return demonstrate safe assistance for stand pivot transfers to the toilet in the patients room.   Also practiced squat pivot transfers from bed to wheelchair with min assist as well.  Pt needing min instructional cueing for technique, including performing enough trunk flexion and positioning the LLE under him.  Pt also worked on functional mobility using the RW with min assist as therapist helped facilitate his trunk.    Therapy Documentation Precautions:  Precautions Precautions: Fall Precaution Comments: increased extensor tone in L UE/LE, impulsive, ataxic Restrictions Weight Bearing Restrictions: No  Pain:  No pain reported ADL:  See FIM for current functional status  Therapy/Group: Individual Therapy  Analyah Mcconnon OTR/L 06/26/2013, 3:40 PM

## 2013-06-27 ENCOUNTER — Inpatient Hospital Stay (HOSPITAL_COMMUNITY): Payer: Medicare Other | Admitting: Rehabilitation

## 2013-06-27 ENCOUNTER — Inpatient Hospital Stay (HOSPITAL_COMMUNITY): Payer: Medicare Other | Admitting: Occupational Therapy

## 2013-06-27 DIAGNOSIS — I10 Essential (primary) hypertension: Secondary | ICD-10-CM

## 2013-06-27 NOTE — Patient Care Conference (Signed)
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 CVA (cerebral infarction) 06/08/2013  . Special screening for malignant neoplasms, colon 04/05/2013  . Tick bite 04/01/2013  . Microcytosis 01/08/2013  . Screening for viral disease 01/04/2013  . Plantar fasciitis 11/20/2012  . Shoulder pain 04/10/2012  . Osteoarthritis 03/27/2012  . BPH (benign prostatic hyperplasia) 06/06/2011  . Preventative health care 06/06/2011  . CVA (cerebral vascular accident) 05/06/2011  . TORTICOLLIS, SPASMODIC 04/27/2007  . ERECTILE DYSFUNCTION 09/19/2006  . DRUG ABUSE, HX OF 09/19/2006  . HYPERLIPIDEMIA 09/18/2006  . HYPERTENSION 09/18/2006    Expected Discharge Date: Expected Discharge Date: 07/09/13  Team Members Present: Physician leading conference: Dr. Claudette Laws Social Worker Present: Amada Jupiter, LCSW;Jenny Dayjah Selman, LCSW Nurse Present: Other (comment) Janeece Agee, RN) PT Present: Edman Circle, PT;Bridgett Ripa, Lillie Columbia, PT OT Present: Rosalio Loud, OT;James Doran Durand, OT SLP Present: Maxcine Ham, Paticia Stack, SLP     Current Status/Progress Goal Weekly Team Focus  Medical   Spastic left hemiparesis, right-sided weakness improving as well. Right shoulder impingement  Improved shoulder pain  Medication and therapy for right shoulder., Continue  spasticity management   Bowel/Bladder   continent of bowel and bladder  Remain continent of bowel and bladder with min assistance      Swallow/Nutrition/ Hydration             ADL's   Pt is currently min assist level for toileting and toilet transfers, bathing, and dressing.  Goals are overall supervision for toileting, bathing, dressing, and modified independent for grooming and feeding.  Normal movement patterns with transitional movements, use of the LUE, sitting and standing balance for ADLs   Mobility   min assist to close SBA for transfers, mod/max for ambulation with RW  supervision to min assist overall   slow, controlled movements, dynamic standing balance, trunk control, gait training and safety.    Communication             Safety/Cognition/ Behavioral Observations            Pain   Pain to bilateral shoulders less frequent prn last given 8/12     monitor and acce4ss for pain   Skin   Skin CDI  No skin infection/breakdown   Monitor/assess skin q shift    Rehab Goals Patient on target to meet rehab goals: Yes Rehab Goals Revised: no *See Care Plan and progress notes for long and short-term goals.  Barriers to Discharge: Fatigue    Possible Resolutions to Barriers:  Continue building endurance with therapy    Discharge Planning/Teaching Needs:  Home with son and wife-son there during the day while wife works  Wife to come in for family education   Team Discussion:  Pt is doing well in therapy and is currently standby assist with transfers.  He is mod-max with the walker and will  still need someone with him 24/7 at d/c.  Therapists are recommending outpatient PT/OT for him at d/c.  Revisions to Treatment Plan:  none   Continued Need for Acute Rehabilitation Level of Care: The patient requires daily medical management by a physician with specialized training in physical medicine and rehabilitation for the following conditions: Daily direction of a multidisciplinary  physical rehabilitation program to ensure safe treatment while eliciting the highest outcome that is of practical value to the patient.: Yes Daily medical management of patient stability for increased activity during participation in an intensive rehabilitation regime.: Yes Daily analysis of laboratory values and/or radiology reports with any subsequent need for medication adjustment of medical intervention for : Neurological problems  Hattye Siegfried, [name] 06/27/2013, 2:32 PM

## 2013-06-27 NOTE — Progress Notes (Signed)
Physical Therapy Session Note  Patient Details  Name: [name] MRN: [id number] Date of Birth: [birth year]  Today's Date: 06/27/2013 Time: 1478-2956 Time Calculation (min): 48 min  Short Term Goals: Week 2:  PT Short Term Goal 1 (Week 2): Pt will perform transfers at min assist level. PT Short Term Goal 2 (Week 2): Pt will perform dynamic sitting balance at supervision level PT Short Term Goal 3 (Week 2): Pt will ambulate x 38' with LRAD at max assist PT Short Term Goal 4 (Week 2): Pt will perform dynamic standing balance activities at min assist.   Skilled Therapeutic Interventions/Progress Updates:   Focus of session was gait training and stretching for UEs/LLE for improved function.  Ambulated x 55' with RW at min to mod assist (mod intermittently for LOB or getting RW too far ahead of him).  Provided cues for upright posture, maintaining position inside of RW, increasing step width, and increasing hip/knee flex for LLE for improved foot clearance.  Once in gym, performed pec stretch x 1 min x 2, L hip to knee stretch x 30 secs x 2 reps, and hip ER/IR x 30 secs each.  Pt frustrated with UE tightness, esp during ambulation this morning.  Ambulated x 70' back towards room with same cues mentioned above with RW and min/mod assist.  Left in bed with hot packs applied to B shoulders (pillow case wrapped around for decreased skin irritation and cues to check skin intermittently.    Therapy Documentation Precautions:  Precautions Precautions: Fall Precaution Comments: increased extensor tone in L UE/LE, impulsive, ataxic Restrictions Weight Bearing Restrictions: No   Pain: Pain Assessment Pain Assessment: 0-10 Pain Score: 0-No pain   Locomotion : Ambulation Ambulation/Gait Assistance: 4: Min assist;3: Mod assist   See FIM for current functional status  Therapy/Group: Individual Therapy  [name] 06/27/2013, 12:37 PM

## 2013-06-27 NOTE — Progress Notes (Signed)
Occupational Therapy Session Note  Patient Details  Name: [name] MRN: [id number] Date of Birth: 1961/08/16  Today's Date: 06/27/2013 Time: 4401-0272 Time Calculation (min): 42 min  Skilled Therapeutic Interventions/Progress Updates:    Took pt to gym to work on neuromuscular re-education for the LUE, trunk, and shoulder.  Began with pt in sitting with work on lateral weightshifts in sitting with emphasis on shifting to the right to active left trunk shortening as needed for active scooting.  Progressed to practicing reciprical scooting, which pt can perform with supervision forward but unable to separate right trunk from left to scoot reciprically backwards.  Pt tends to use his legs to push him back instead of his trunk.  Had pt work on forward trunk flexion in sitting, with emphasis on reaching to the left as pt demonstrates increased tightness with this movement.  Transitioned to sit to stand after performing reaching for carry through of activity.  Also positioned pt in right sidelying and performed scapular stretches and mobilizations.  Transitioned to prone and worked on scapular retraction exercises with mod assist as well.    Therapy Documentation Precautions:  Precautions Precautions: Fall Precaution Comments: increased extensor tone in L UE/LE, impulsive, ataxic Restrictions Weight Bearing Restrictions: No  Pain:  No report of pain  Therapy/Group: Individual Therapy  Jabrea Kallstrom OTR/L 06/27/2013, 3:20 PM

## 2013-06-27 NOTE — Progress Notes (Signed)
Social Work Patient ID: [name], male   DOB: 16-Jun-1961, 52 y.o.   MRN: [id number]  CSW met with pt to update him on the Team Conference results.  Pt is doing well and is on target for d/c date of 07-09-13.  Pt will need someone to be with him 24/7 and he is aware of this.  He will have his son and his wife with him.  Team is recommending outpatient PT/OT.  CSW spoke with pt's wife and updated her on the above.  She confirmed that she will be able to take him to PT/OT.  Pt was also concerned about his disability and CSW followed up with wife about that.  She stated she was at [employer] yesterday and received the information she needed.  CSW also scheduled for wife to come for family education Friday afternoon.  Wife was appreciative of this plan and she/pt have no other needs/concerns at this time.  CSW will continue to follow and assist/support as needed.

## 2013-06-27 NOTE — Progress Notes (Signed)
Occupational Therapy Session Note  Patient Details  Name: [name] MRN: [id number] Date of Birth: August 30, 1961  Today's Date: 06/27/2013 Time: 0900-1000 Time Calculation (min): 60 min  Short Term Goals: Week 3:  OT Short Term Goal 1 (Week 3): Pt will perform bathing sit to stand in the shower with AE and supervison. OT Short Term Goal 2 (Week 3): Pt will peform dressing sit to stand at the sink with supervision. OT Short Term Goal 3 (Week 3): Pt will perform toilet transfers stand/squat pivot with supervision. OT Short Term Goal 4 (Week 3): Pt will perform shower transfers with close supervision  Skilled Therapeutic Interventions/Progress Updates:    Pt performed ambulation to the shower using the RW and min assist.  Performed bathing and dressing with min guard supervision during session.  Pt needs min assist and min instructional cueing to place the LLE up under him further prior to attempted standing and to stabilize the LUE on the chair arm, secondary to tone.  Still with increased flexor tone in the LLE with mobility.  Pt actually stood to urinate during session with min guard assist while stabilizing LUE on grab bar and RUE on the wall.    Therapy Documentation Precautions:  Precautions Precautions: Fall Precaution Comments: increased extensor tone in L UE/LE, impulsive, ataxic Restrictions Weight Bearing Restrictions: No  Pain: Pain Assessment Pain Assessment: 0-10 Pain Score: 0-No pain ADL: See FIM for current functional status  Therapy/Group: Individual Therapy  Nyala Kirchner OTR/L 06/27/2013, 12:21 PM

## 2013-06-27 NOTE — Progress Notes (Signed)
Patient ID: [name], male   DOB: [birth year], 52 y.o.   MRN: [id number] Subjective/Complaints: 52 y.o. 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 Patient states to be independent prior to admission living with his wife who works day shift. Admitted 06/08/2013 with right-sided weakness. MRI of the brain showed a 1.5 cm region of acute infarction affecting the white matter adjacent to posterior body of the left lateral ventricle with extension into the external capsule region as well as extensive ischemic changes throughout the brain. Carotid Dopplers with less than 40% ICA stenosis. Echocardiogram with ejection fraction of 70% grade 1 diastolic dysfunction. Urine drug screen was negative. Patient did not receive TPA. Neurology services consulted maintained on aspirin therapy for CVA prophylaxis as well as subcutaneous Lovenox for DVT prophylaxis  No new issues overnight. Spasms well controlled. right shoulder pain improved. On Celebrex Wife participating in family training A 12 point review of systems has been performed and if not noted above is otherwise negative.   Objective: Vital Signs: Blood pressure 132/97, pulse 78, temperature 98.4 F (36.9 C), temperature source Oral, resp. rate 18, height 5' 6.14" (1.68 m), weight 81.965 kg (180 lb 11.2 oz), SpO2 97.00%. No results found. No results found for this or any previous visit (from the past 72 hour(s)).   HEENT: normal Cardio: tachy and no murmurs Resp: CTA B/L and unlabored GI: BS positive and non tender Extremity:  Pulses positive and No Edema Skin:   Intact Neuro: Alert/Oriented, Cranial Nerve II-XII normal, Abnormal Sensory decreased LUE/LLE LT (distal more so than prox), Abnormal Motor 3-/5 L delt,bi,tri,grip,3+ HF,KE ADF,4/5 on R side  Abnormal FMC Ataxic/ dec FMC and Tone  Hypertonic on L side 2-3/4, Tone:  normal tone on R  side and Dysarthric Musc/Skel:  No R shoulder pain with overhead activity, - impingement signs Gen NAD   Assessment/Plan: 1. 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 Physiatrist is providing close team supervision and 24 hour management of active medical problems listed below. Physiatrist and rehab team continue to assess barriers to discharge/monitor patient progress toward functional and medical goals. FIM: FIM - Bathing Bathing Steps Patient Completed: Chest;Right Arm;Left Arm;Abdomen;Front perineal area;Buttocks;Right upper leg;Left upper leg;Right lower leg (including foot);Left lower leg (including foot) Bathing: 4: Steadying assist  FIM - Upper Body Dressing/Undressing Upper body dressing/undressing steps patient completed: Thread/unthread right sleeve of pullover shirt/dresss;Thread/unthread left sleeve of pullover shirt/dress;Put head through opening of pull over shirt/dress;Pull shirt over trunk Upper body dressing/undressing: 5: Supervision: Safety issues/verbal cues FIM - Lower Body Dressing/Undressing Lower body dressing/undressing steps patient completed: Thread/unthread right underwear leg;Thread/unthread left underwear leg;Pull underwear up/down;Thread/unthread right pants leg;Thread/unthread left pants leg;Pull pants up/down;Don/Doff right shoe;Don/Doff left shoe Lower body dressing/undressing: 4: Steadying Assist  FIM - Toileting Toileting steps completed by patient: Adjust clothing prior to toileting;Performs perineal hygiene Toileting Assistive Devices: Grab bar or rail for support Toileting: 4: Steadying assist  FIM - Diplomatic Services operational officer Devices: Grab bars Toilet Transfers: 4-To toilet/BSC: Min A (steadying Pt. > 75%)  FIM - Bed/Chair Transfer Bed/Chair  Transfer Assistive Devices: Arm rests Bed/Chair Transfer: 5: Supine > Sit: Supervision (verbal cues/safety issues);4: Bed > Chair or W/C: Min A (steadying Pt. > 75%)  FIM - Locomotion: Wheelchair  Distance: 100 Locomotion: Wheelchair: 2: Travels 50 - 149 ft with minimal assistance (Pt.>75%) FIM - Locomotion: Ambulation Locomotion: Ambulation Assistive Devices: Designer, industrial/product Ambulation/Gait Assistance: 4: Min assist;3: Mod assist Locomotion: Ambulation: 2: Travels 50 - 149 ft with moderate assistance (Pt: 50 - 74%)  Comprehension Comprehension Mode: Auditory Comprehension: 5-Understands basic 90% of the time/requires cueing < 10% of the time  Expression Expression Mode: Verbal Expression: 5-Expresses basic needs/ideas: With extra time/assistive device  Social Interaction Social Interaction: 5-Interacts appropriately 90% of the time - Needs monitoring or encouragement for participation or interaction.  Problem Solving Problem Solving: 5-Solves basic 90% of the time/requires cueing < 10% of the time  Memory Memory: 5-Recognizes or recalls 90% of the time/requires cueing < 10% of the time  Medical Problem List and Plan:  1. Thrombotic left periventricular,EC infarct  2. DVT Prophylaxis/Anticoagulation: Subcutaneous Lovenox. Monitor platelet counts and any signs of bleeding  3. Pain Management: Tylenol as needed, Celebrex for 1 week total  4. Neuropsych: This patient is capable of making decisions on his own behalf.   -added trazodone for sleep 5. Hypertension. Presently on Norvasc 10 mg daily. Monitor with increased mobility. Patient also on Tenormin 50 mg daily, hydrochlorothiazide 25 mg daily and lisinopril 40 mg daily prior to admission. --resumed hctz yesterday with some improvement seen  -continue to follow and adjust as needed 6. 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 Latest urine drug screen negative  7. Hyperlipidemia. Zocor  8. Spasticity,  chronic left sided tone from remote cva.  -low dose baclofen trial- titrated up 10mg  qid, monitor response   -continue ROM, left resting hand splint ordered Will 9. Postop shoulder pain right side. Suspect subacromial impingement syndrome. Continue OT/PT, Celebrex for 7 days  LOS (Days) 16 A FACE TO FACE EVALUATION WAS PERFORMED  KIRSTEINS,ANDREW E 06/27/2013, 4:15 PM

## 2013-06-27 NOTE — Progress Notes (Signed)
Physical Therapy Session Note  Patient Details  Name: [name] MRN: [id number] Date of Birth: 10/20/1961  Today's Date: 06/27/2013 Time: 4098-1191 Time Calculation (min): 47 min  Short Term Goals: Week 2:  PT Short Term Goal 1 (Week 2): Pt will perform transfers at min assist level. PT Short Term Goal 2 (Week 2): Pt will perform dynamic sitting balance at supervision level PT Short Term Goal 3 (Week 2): Pt will ambulate x 48' with LRAD at max assist PT Short Term Goal 4 (Week 2): Pt will perform dynamic standing balance activities at min assist.   Skilled Therapeutic Interventions/Progress Updates:   Focus of session was gait training with RW and NMR for improved weight shifting and controlled movements of BLE with stepping activity.  Gait training x 90' x 1 rep and 165' x 1 rep at min/mod assist with continued cues for upright posture, maintaining position inside of RW and slow controlled movements.  Prior to second ambulation, applied ace wrap to LLE for improved foot clearance and also applied wash cloth to L handle for improved grip on RW.  Performed stepping to 4" step without UE support.  Pt requires mod assist for full weight bearing during stance and slow controlled stepping.    Therapy Documentation Precautions:  Precautions Precautions: Fall Precaution Comments: increased extensor tone in L UE/LE, impulsive, ataxic Restrictions Weight Bearing Restrictions: No Vital Signs: Therapy Vitals Temp: 98.4 F (36.9 C) Temp src: Oral Pulse Rate: 78 Resp: 18 BP: 132/97 mmHg Patient Position, if appropriate: Sitting Oxygen Therapy SpO2: 97 % O2 Device: None (Room air) Pain:Pt with pain in B shoulders due to increased tightness  Locomotion : Ambulation Ambulation/Gait Assistance: 4: Min assist;3: Mod assist   See FIM for current functional status  Therapy/Group: Individual Therapy  [name] 06/27/2013, 4:38 PM

## 2013-06-28 ENCOUNTER — Inpatient Hospital Stay (HOSPITAL_COMMUNITY): Payer: Medicare Other | Admitting: Occupational Therapy

## 2013-06-28 ENCOUNTER — Inpatient Hospital Stay (HOSPITAL_COMMUNITY): Payer: Medicare Other | Admitting: Rehabilitation

## 2013-06-28 MED ORDER — BACLOFEN 20 MG PO TABS
20.0000 mg | ORAL_TABLET | Freq: Three times a day (TID) | ORAL | Status: DC
  Administered 2013-06-28 – 2013-07-01 (×12): 20 mg via ORAL
  Filled 2013-06-28 (×16): qty 1

## 2013-06-28 NOTE — Progress Notes (Signed)
Physical Therapy Weekly Progress Note  Patient Details  Name: [name] MRN: [id number] Date of Birth: Aug 07, 1961  Today's Date: 06/28/2013 Time: 7829-5621 Time Calculation (min): 47 min  Patient has met 3 of 4 short term goals.  Pt progressing very well with all mobility and is currently min assist for ambulation with RW.  Today had family education session with wife and performed stairs, ambulation and car transfer with pt/wife.  Both verbalize and return demonstration.  Pt would like to work towards ambulation without AD, therefore will continue to work towards this goal.    Patient continues to demonstrate the following deficits: ataxia, tone, decreased balance, decreased trunk control and therefore will continue to benefit from skilled PT intervention to enhance overall performance with activity tolerance, balance, postural control, ability to compensate for deficits and coordination.  Patient progressing toward long term goals..  Continue plan of care.  PT Short Term Goals Week 2:  PT Short Term Goal 1 (Week 2): Pt will perform transfers at min assist level. PT Short Term Goal 1 - Progress (Week 2): Met PT Short Term Goal 2 (Week 2): Pt will perform dynamic sitting balance at supervision level PT Short Term Goal 2 - Progress (Week 2): Met PT Short Term Goal 3 (Week 2): Pt will ambulate x 92' with LRAD at max assist PT Short Term Goal 3 - Progress (Week 2): Met PT Short Term Goal 4 (Week 2): Pt will perform dynamic standing balance activities at min assist.  PT Short Term Goal 4 - Progress (Week 2): Progressing toward goal Week 3:  PT Short Term Goal 1 (Week 3): Pt will perform stand pivot transfers with RW at supervision PT Short Term Goal 2 (Week 3): Pt will perform dynamic sitting balance at mod I level PT Short Term Goal 3 (Week 3): Pt will ambulate x 150' w/ LRAD at min/guard assist for steadying PT Short Term Goal 4 (Week 3): Pt will perform dynamic standing balance  activities at min assist.   Skilled Therapeutic Interventions/Progress Updates:   Focus of session was family education with pt/wife.  Had them perform car transfer, ambulation with RW and stairs with use of single handrail.  Both verbalize and return demonstration during session.  Wife requires some continued cues for keeping hand on pt with ambulation for safety.  Also discussed possible equipment needs at time of D/C.    Therapy Documentation Precautions:  Precautions Precautions: Fall Precaution Comments: increased extensor tone in L UE/LE, impulsive, ataxic Restrictions Weight Bearing Restrictions: No   Vital Signs: Therapy Vitals Temp: 98.5 F (36.9 C) Temp src: Oral Pulse Rate: 88 Resp: 18 BP: 126/87 mmHg Patient Position, if appropriate: Sitting Oxygen Therapy SpO2: 100 % O2 Device: None (Room air) Pain: Pain Assessment Pain Assessment: No/denies pain   Locomotion : Ambulation Ambulation/Gait Assistance: 4: Min assist   See FIM for current functional status  Therapy/Group: Individual Therapy  [name] 06/28/2013, 4:16 PM

## 2013-06-28 NOTE — Progress Notes (Signed)
Occupational Therapy Session Note  Patient Details  Name: [name] MRN: [id number] Date of Birth: 1961-03-14  Today's Date: 06/28/2013 Time: 0900-1000 Time Calculation (min): 60 min  Short Term Goals: Week 3:  OT Short Term Goal 1 (Week 3): Pt will perform bathing sit to stand in the shower with AE and supervison. OT Short Term Goal 2 (Week 3): Pt will peform dressing sit to stand at the sink with supervision. OT Short Term Goal 3 (Week 3): Pt will perform toilet transfers stand/squat pivot with supervision. OT Short Term Goal 4 (Week 3): Pt will perform shower transfers with close supervision  Skilled Therapeutic Interventions/Progress Updates:    Pt walked to the dresser with min assist using the RW to gather his clothes and then to the shower seat in the walk-in shower.  Pt needing min instructional cueing to stay inside of the walker secondary to being too far outside of it on the left side with turns.  Bathing with overall min guard assist including sit to stand in which pt used the rail to hold onto.  Pt performed dressing sit to stand from chair with min guard assist as well.  Pt continues to need mod instructional cueing to position the LLE back up under him further with functional transfers.   Therapy Documentation Precautions:  Precautions Precautions: Fall Precaution Comments: increased extensor tone in L UE/LE, impulsive, ataxic Restrictions Weight Bearing Restrictions: No  Pain: Pain Assessment Pain Assessment: 0-10 Pain Type: Acute pain Pain Location: Shoulder Pain Orientation: Right Pain Intervention(s): Repositioned ADL: See FIM for current functional status  Therapy/Group: Individual Therapy  Devinne Epstein OTR/L 06/28/2013, 12:18 PM

## 2013-06-28 NOTE — Plan of Care (Signed)
Problem: RH SKIN INTEGRITY Goal: RH STG SKIN FREE OF INFECTION/BREAKDOWN Skin will remain free from breakdown with min assist  Outcome: Progressing No skin issues noted

## 2013-06-28 NOTE — Progress Notes (Signed)
Occupational Therapy Session Note  Patient Details  Name: [name] MRN: [id number] Date of Birth: 1961/04/03  Today's Date: 06/28/2013 Time: 6295-2841 Time Calculation (min): 45 min  Skilled Therapeutic Interventions/Progress Updates:    Pt performed toilet transfer with min assist using the RW.  Went down to the ADL apartment an practiced tub/shower transfers.  Performed two transfers, one by stepping over the edge of the tub and the other using the tub bench.  Pt demonstrating increased efficiency and safety using the tub bench vs stepping over the edge of the tub. Pt understands that this is much safer as well.  Ambulated to the rehab gym at end of session with focus on reaching activity with the LUE promoting elbow extension and shoulder flexion.  Pt with increased difficulty performing secondary to increased flexor tone in the elbow.    Therapy Documentation Precautions:  Precautions Precautions: Fall Precaution Comments: increased extensor tone in L UE/LE, impulsive, ataxic Restrictions Weight Bearing Restrictions: No  Pain: Pain Assessment Pain Assessment: No/denies pain  See FIM for current functional status  Therapy/Group: Individual Therapy  Ortencia Askari OTR/L 06/28/2013, 3:41 PM

## 2013-06-28 NOTE — Progress Notes (Signed)
Patient ID: [name], male   DOB: 08/02/1961, 52 y.o.   MRN: [id number] Subjective/Complaints: 52 y.o. 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 Patient states to be independent prior to admission living with his wife who works day shift. Admitted 06/08/2013 with right-sided weakness. MRI of the brain showed a 1.5 cm region of acute infarction affecting the white matter adjacent to posterior body of the left lateral ventricle with extension into the external capsule region as well as extensive ischemic changes throughout the brain. Carotid Dopplers with less than 40% ICA stenosis. Echocardiogram with ejection fraction of 70% grade 1 diastolic dysfunction. Urine drug screen was negative. Patient did not receive TPA. Neurology services consulted maintained on aspirin therapy for CVA prophylaxis as well as subcutaneous Lovenox for DVT prophylaxis  No new issues overnight. Spasms well controlled. right shoulder pain improved. On Celebrex Wife participating in family training No complaints  A 12 point review of systems has been performed and if not noted above is otherwise negative.   Objective: Vital Signs: Blood pressure 138/99, pulse 79, temperature 98.3 F (36.8 C), temperature source Oral, resp. rate 18, height 5' 6.14" (1.68 m), weight 81.965 kg (180 lb 11.2 oz), SpO2 93.00%. No results found. No results found for this or any previous visit (from the past 72 hour(s)).   HEENT: normal Cardio: tachy and no murmurs Resp: CTA B/L and unlabored GI: BS positive and non tender Extremity:  Pulses positive and No Edema Skin:   Intact Neuro: Alert/Oriented, Cranial Nerve II-XII normal, Abnormal Sensory decreased LUE/LLE LT (distal more so than prox), Abnormal Motor 3-/5 L delt,bi,tri,grip,3+ HF,KE ADF,4/5 on R side  Abnormal FMC Ataxic/ dec FMC and Tone  Hypertonic on L side 2-3/4, Tone:   normal tone on R side and Dysarthric Musc/Skel:  No R shoulder pain with overhead activity, - impingement signs Gen NAD   Assessment/Plan: 1. 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 Physiatrist is providing close team supervision and 24 hour management of active medical problems listed below. Physiatrist and rehab team continue to assess barriers to discharge/monitor patient progress toward functional and medical goals. FIM: FIM - Bathing Bathing Steps Patient Completed: Chest;Right Arm;Left Arm;Abdomen;Front perineal area;Buttocks;Right upper leg;Left upper leg;Right lower leg (including foot);Left lower leg (including foot) Bathing: 4: Steadying assist  FIM - Upper Body Dressing/Undressing Upper body dressing/undressing steps patient completed: Thread/unthread right sleeve of pullover shirt/dresss;Thread/unthread left sleeve of pullover shirt/dress;Put head through opening of pull over shirt/dress;Pull shirt over trunk Upper body dressing/undressing: 5: Supervision: Safety issues/verbal cues FIM - Lower Body Dressing/Undressing Lower body dressing/undressing steps patient completed: Thread/unthread right underwear leg;Thread/unthread left underwear leg;Pull underwear up/down;Thread/unthread right pants leg;Thread/unthread left pants leg;Pull pants up/down;Don/Doff right shoe;Don/Doff left shoe Lower body dressing/undressing: 4: Steadying Assist  FIM - Toileting Toileting steps completed by patient: Adjust clothing prior to toileting;Performs perineal hygiene Toileting Assistive Devices: Grab bar or rail for support Toileting: 4: Steadying assist  FIM - Diplomatic Services operational officer Devices: Grab bars Toilet Transfers: 4-To toilet/BSC: Min A (steadying Pt. > 75%)  FIM - Bed/Chair  Transfer Bed/Chair Transfer Assistive Devices: Arm rests Bed/Chair Transfer: 5: Supine > Sit: Supervision (verbal cues/safety issues);4: Bed > Chair or W/C: Min A (steadying Pt. > 75%)  FIM -  Locomotion: Wheelchair Distance: 100 Locomotion: Wheelchair: 2: Travels 50 - 149 ft with minimal assistance (Pt.>75%) FIM - Locomotion: Ambulation Locomotion: Ambulation Assistive Devices: Designer, industrial/product Ambulation/Gait Assistance: 4: Min assist;3: Mod assist Locomotion: Ambulation: 3: Travels 150 ft or more with moderate assistance (Pt: 50 - 74%)  Comprehension Comprehension Mode: Auditory Comprehension: 5-Understands basic 90% of the time/requires cueing < 10% of the time  Expression Expression Mode: Verbal Expression: 5-Expresses basic needs/ideas: With extra time/assistive device  Social Interaction Social Interaction: 5-Interacts appropriately 90% of the time - Needs monitoring or encouragement for participation or interaction.  Problem Solving Problem Solving: 5-Solves basic 90% of the time/requires cueing < 10% of the time  Memory Memory: 5-Recognizes or recalls 90% of the time/requires cueing < 10% of the time  Medical Problem List and Plan:  1. Thrombotic left periventricular,EC infarct  2. DVT Prophylaxis/Anticoagulation: Subcutaneous Lovenox. Monitor platelet counts and any signs of bleeding  3. Pain Management: Tylenol as needed, Celebrex for 1 week total  4. Neuropsych: This patient is capable of making decisions on his own behalf.   -added trazodone for sleep 5. Hypertension. Presently on Norvasc 10 mg daily. Monitor with increased mobility. Patient also on Tenormin 50 mg daily, hydrochlorothiazide 25 mg daily and lisinopril 40 mg daily prior to admission.   -continue to follow and adjust as needed 6. 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 Latest urine drug screen negative  7. Hyperlipidemia. Zocor  8. Spasticity, chronic left sided tone from  remote cva.  -low dose baclofen trial- titrated up 10mg  qid, monitor response   -continue ROM, left resting hand splint ordered Will 9. Postop shoulder pain right side. Suspect subacromial impingement syndrome. Continue OT/PT, Celebrex for 7 days  LOS (Days) 17 A FACE TO FACE EVALUATION WAS PERFORMED  Clytee Heinrich E 06/28/2013, 12:11 PM

## 2013-06-28 NOTE — Progress Notes (Signed)
Physical Therapy Session Note  Patient Details  Name: [name] MRN: [id number] Date of Birth: [birth year]  Today's Date: 06/28/2013 Time: 6213-0865 Time Calculation (min): 42 min  Short Term Goals: Week 2:  PT Short Term Goal 1 (Week 2): Pt will perform transfers at min assist level. PT Short Term Goal 2 (Week 2): Pt will perform dynamic sitting balance at supervision level PT Short Term Goal 3 (Week 2): Pt will ambulate x 43' with LRAD at max assist PT Short Term Goal 4 (Week 2): Pt will perform dynamic standing balance activities at min assist.   Skilled Therapeutic Interventions/Progress Updates:   Focus of session was gait training with RW and high level balance and trunk control activity.  Ambulated 162' x 1 with RW at min assist with continued cues for upright posture, slow controlled movements and maintaining position inside of RW.  Performed all gait training with ace wrap on LLE for improved foot/toe clearance and also with wash cloth wrapped on L hand grip on walker for improved grip.  Also performed gait training in hallway without UE support at mod assist with cues for not utilizing UEs on therapists arms.  Then used R handrail in hallway at min assist with cues for upright posture with head, neck and chest.  Performed dynamic sitting balance on physioball at intermittent min/guard to min assist as support when from single UE support on leg to no UE support to catching/hitting beach ball to/from different targets.  Pt continues to demo some ballistic movements in LEs with this activity with cues for maintaining BLE in contact with ground.    Therapy Documentation Precautions:  Precautions Precautions: Fall Precaution Comments: increased extensor tone in L UE/LE, impulsive, ataxic Restrictions Weight Bearing Restrictions: No   Vital Signs:   Pain: Pt continues to have some pain in upper trap/shoulder on R side and states some L hip pain at end of physioball activity.   Allowed several rest breaks.  Ambulation Ambulation/Gait Assistance: 4: Min assist  See FIM for current functional status  Therapy/Group: Individual Therapy  [name] 06/28/2013, 12:35 PM

## 2013-06-29 DIAGNOSIS — G8110 Spastic hemiplegia affecting unspecified side: Secondary | ICD-10-CM

## 2013-06-29 DIAGNOSIS — I6330 Cerebral infarction due to thrombosis of unspecified cerebral artery: Secondary | ICD-10-CM

## 2013-06-29 NOTE — Progress Notes (Signed)
Patient ID: [name], male   DOB: [birth year], 52 y.o.   MRN: [id number] Subjective/Complaints: 52 y.o. 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 Patient states to be independent prior to admission living with his wife who works day shift. Admitted 06/08/2013 with right-sided weakness. MRI of the brain showed a 1.5 cm region of acute infarction affecting the white matter adjacent to posterior body of the left lateral ventricle with extension into the external capsule region as well as extensive ischemic changes throughout the brain. Carotid Dopplers with less than 40% ICA stenosis. Echocardiogram with ejection fraction of 70% grade 1 diastolic dysfunction. Urine drug screen was negative. Patient did not receive TPA. Neurology services consulted maintained on aspirin therapy for CVA prophylaxis as well as subcutaneous Lovenox for DVT prophylaxis  No new issues overnight. Spasms better controlled. right shoulder pain improved. Pleased with progress Wife participating in family training No complaints  A 12 point review of systems has been performed and if not noted above is otherwise negative.   Objective: Vital Signs: Blood pressure 137/98, pulse 81, temperature 98.2 F (36.8 C), temperature source Oral, resp. rate 16, height 5' 6.14" (1.68 m), weight 81.965 kg (180 lb 11.2 oz), SpO2 97.00%. No results found. No results found for this or any previous visit (from the past 72 hour(s)).   HEENT: normal Cardio: tachy and no murmurs Resp: CTA B/L and unlabored GI: BS positive and non tender Extremity:  Pulses positive and No Edema Skin:   Intact Neuro: Alert/Oriented, Cranial Nerve II-XII normal, Abnormal Sensory decreased LUE/LLE LT (distal more so than prox), Abnormal Motor 3-/5 L delt,bi,tri,grip,3+ HF,KE ADF,4/5 on R side  Abnormal FMC Ataxic/ dec FMC and Tone  Hypertonic on L side  2-3/4, Tone:  normal tone on R side and Dysarthric Musc/Skel:  No R shoulder pain with overhead activity, - impingement signs Gen NAD   Assessment/Plan: 1. 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 Physiatrist is providing close team supervision and 24 hour management of active medical problems listed below. Physiatrist and rehab team continue to assess barriers to discharge/monitor patient progress toward functional and medical goals. FIM: FIM - Bathing Bathing Steps Patient Completed: Chest;Right Arm;Left Arm;Abdomen;Front perineal area;Buttocks;Right upper leg;Left upper leg;Right lower leg (including foot);Left lower leg (including foot) Bathing: 5: Supervision: Safety issues/verbal cues  FIM - Upper Body Dressing/Undressing Upper body dressing/undressing steps patient completed: Thread/unthread right sleeve of pullover shirt/dresss;Thread/unthread left sleeve of pullover shirt/dress;Put head through opening of pull over shirt/dress;Pull shirt over trunk Upper body dressing/undressing: 5: Supervision: Safety issues/verbal cues FIM - Lower Body Dressing/Undressing Lower body dressing/undressing steps patient completed: Thread/unthread right underwear leg;Thread/unthread left underwear leg;Pull underwear up/down;Thread/unthread right pants leg;Thread/unthread left pants leg;Don/Doff right shoe;Don/Doff left shoe;Don/Doff right sock;Don/Doff left sock Lower body dressing/undressing: 4: Steadying Assist  FIM - Toileting Toileting steps completed by patient: Adjust clothing prior to toileting;Performs perineal hygiene Toileting Assistive Devices: Grab bar or rail for support Toileting: 4: Steadying assist  FIM - Diplomatic Services operational officer Devices: Grab bars Toilet Transfers: 4-To  toilet/BSC: Min A (steadying Pt. > 75%)  FIM - Bed/Chair Transfer Bed/Chair Transfer Assistive Devices: Therapist, occupational: 4: Bed > Chair or W/C: Min A (steadying Pt. > 75%)  FIM - Locomotion: Wheelchair  Distance: 100 Locomotion: Wheelchair: 2: Travels 50 - 149 ft with minimal assistance (Pt.>75%) FIM - Locomotion: Ambulation Locomotion: Ambulation Assistive Devices: Designer, industrial/product Ambulation/Gait Assistance: 4: Min assist Locomotion: Ambulation: 4: Travels 150 ft or more with minimal assistance (Pt.>75%)  Comprehension Comprehension Mode: Auditory Comprehension: 5-Understands basic 90% of the time/requires cueing < 10% of the time  Expression Expression Mode: Verbal Expression: 5-Expresses basic needs/ideas: With extra time/assistive device  Social Interaction Social Interaction: 5-Interacts appropriately 90% of the time - Needs monitoring or encouragement for participation or interaction.  Problem Solving Problem Solving: 5-Solves basic 90% of the time/requires cueing < 10% of the time  Memory Memory: 5-Recognizes or recalls 90% of the time/requires cueing < 10% of the time  Medical Problem List and Plan:  1. Thrombotic left periventricular,EC infarct  2. DVT Prophylaxis/Anticoagulation: Subcutaneous Lovenox. Monitor platelet counts and any signs of bleeding  3. Pain Management: Tylenol as needed, Celebrex for 1 week total  4. Neuropsych: This patient is capable of making decisions on his own behalf.   - trazodone for sleep 5. Hypertension. Presently on Norvasc 10 mg daily. Monitor with increased mobility. Patient also on Tenormin 50 mg daily, hydrochlorothiazide 25 mg daily and lisinopril 40 mg daily prior to admission.   -continue to follow and adjust as needed 6. 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 Latest urine drug screen negative  7. Hyperlipidemia. Zocor  8. 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  -baclofen  10mg  qid,     -continue ROM, left resting hand splint ordered Will 9. Postop shoulder pain right side. Suspect subacromial impingement syndrome. Continue OT/PT, Celebrex for 7 days  LOS (Days) 18 A FACE TO FACE EVALUATION WAS PERFORMED  SWARTZ,ZACHARY T 06/29/2013, 7:44 AM

## 2013-06-30 ENCOUNTER — Inpatient Hospital Stay (HOSPITAL_COMMUNITY): Payer: Medicare Other | Admitting: *Deleted

## 2013-06-30 ENCOUNTER — Inpatient Hospital Stay (HOSPITAL_COMMUNITY): Payer: Medicare Other | Admitting: Physical Therapy

## 2013-06-30 MED ORDER — LISINOPRIL 20 MG PO TABS
20.0000 mg | ORAL_TABLET | Freq: Every day | ORAL | Status: DC
  Administered 2013-06-30 – 2013-07-09 (×10): 20 mg via ORAL
  Filled 2013-06-30 (×12): qty 1

## 2013-06-30 NOTE — Progress Notes (Signed)
Occupational Therapy Note   Patient Details  Name: [name] MRN: [id number] Date of Birth: 1961/05/14 Today's Date: 06/30/2013 Time:  0945-1020  (35 min) Pain:  Neck and shoulders 5/10 Individual session  Transferred to mat with SBA and engaged in NMRE, AAROM, AROM of BUE.  Did trunk segmental rotation, lateral weight shifts, anterior, posterior pelvic tilts.  Placed LUE in weight bearing position and did sit to half stand and held for 20 seconds.  LUE needed mod assist to maintain contact with mat.  Pt. Complained of feeling nauseated and dizzzy that lasted 5 minutes.  Applied heat back to neck and shoulders and took pt back to room.  NT in room and planned to put pt back to bed after making bed.     Humberto Seals 06/30/2013, 10:25 AM

## 2013-06-30 NOTE — Progress Notes (Signed)
Physical Therapy Session Note  Patient Details  Name: [name] MRN: [id number] Date of Birth: 1961/05/10  Today's Date: 06/30/2013 Time: 0800-0900 Time Calculation (min): 60 min  Short Term Goals: Week 1:  PT Short Term Goal 1 (Week 1): Pt will perform stand pivot transfer at mod assist and min cuing for technique.  PT Short Term Goal 1 - Progress (Week 1): Met PT Short Term Goal 2 (Week 1): Pt will perform dynamic sitting balance at min assist level PT Short Term Goal 2 - Progress (Week 1): Met PT Short Term Goal 3 (Week 1): Pt will ambulate x 1' with LRAD at max assist PT Short Term Goal 3 - Progress (Week 1): Progressing toward goal PT Short Term Goal 4 (Week 1): Pt will correct R lateral leaning in sitting/standing with min assist and min cues.  PT Short Term Goal 4 - Progress (Week 1): Progressing toward goal PT Short Term Goal 5 (Week 1): Pt will perform dynamic standing balance activities at mod assist.  PT Short Term Goal 5 - Progress (Week 1): Met  Skilled Therapeutic Interventions/Progress Updates:  Pt was seen bedside in the am, finishing breakfast. Pt transferred supine to edge of bed with head of bed elevated, side rail and S. Pt transferred edge of bed to standing with min Ax1 and rolling walker. Pt able to stand about to 2 mins with utilize the urinal in standing position with min Ax1. Pt ambulated x2 with rolling walker and min Ax1, verbal cues for technique. While in rehab gym, focused on sit to stand transfers x 10, focusing on both eccentric and concentric control. Pt left sitting up in w/c at end of treatment session.   Therapy Documentation Precautions:  Precautions Precautions: Fall Precaution Comments: increased extensor tone in L UE/LE, impulsive, ataxic Restrictions Weight Bearing Restrictions: No General:  Pain: Pain Assessment Pain Score: 0-No pain  Locomotion : Ambulation Ambulation/Gait Assistance: 4: Min assist   See FIM for current  functional status  Therapy/Group: Individual Therapy   Rayford Halsted 06/30/2013, 11:45 AM

## 2013-06-30 NOTE — Progress Notes (Signed)
Patient ID: [name], male   DOB: Mar 13, 1961, 52 y.o.   MRN: [id number] Subjective/Complaints: 52 y.o. 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 Patient states to be independent prior to admission living with his wife who works day shift. Admitted 06/08/2013 with right-sided weakness. MRI of the brain showed a 1.5 cm region of acute infarction affecting the white matter adjacent to posterior body of the left lateral ventricle with extension into the external capsule region as well as extensive ischemic changes throughout the brain. Carotid Dopplers with less than 40% ICA stenosis. Echocardiogram with ejection fraction of 70% grade 1 diastolic dysfunction. Urine drug screen was negative. Patient did not receive TPA. Neurology services consulted maintained on aspirin therapy for CVA prophylaxis as well as subcutaneous Lovenox for DVT prophylaxis  Slept well. No complaints  A 12 point review of systems has been performed and if not noted above is otherwise negative.   Objective: Vital Signs: Blood pressure 132/91, pulse 74, temperature 98.4 F (36.9 C), temperature source Oral, resp. rate 18, height 5' 6.14" (1.68 m), weight 81.965 kg (180 lb 11.2 oz), SpO2 95.00%. No results found. No results found for this or any previous visit (from the past 72 hour(s)).   HEENT: normal Cardio: tachy and no murmurs Resp: CTA B/L and unlabored GI: BS positive and non tender Extremity:  Pulses positive and No Edema Skin:   Intact Neuro: Alert/Oriented, Cranial Nerve II-XII normal, Abnormal Sensory decreased LUE/LLE LT (distal more so than prox), Abnormal Motor 3-/5 L delt,bi,tri,grip,3+ HF,KE ADF,4/5 on R side  Abnormal FMC Ataxic/ dec FMC and Tone  Hypertonic on L side 2-3/4, Tone:  normal tone on R side and Dysarthric Musc/Skel:  No R shoulder pain with overhead activity, - impingement signs Gen  NAD   Assessment/Plan: 1. 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 Physiatrist is providing close team supervision and 24 hour management of active medical problems listed below. Physiatrist and rehab team continue to assess barriers to discharge/monitor patient progress toward functional and medical goals. FIM: FIM - Bathing Bathing Steps Patient Completed: Chest;Right Arm;Left Arm;Abdomen;Front perineal area;Buttocks;Right upper leg;Left upper leg;Right lower leg (including foot);Left lower leg (including foot) Bathing: 5: Supervision: Safety issues/verbal cues  FIM - Upper Body Dressing/Undressing Upper body dressing/undressing steps patient completed: Thread/unthread right sleeve of pullover shirt/dresss;Thread/unthread left sleeve of pullover shirt/dress;Put head through opening of pull over shirt/dress;Pull shirt over trunk Upper body dressing/undressing: 5: Supervision: Safety issues/verbal cues FIM - Lower Body Dressing/Undressing Lower body dressing/undressing steps patient completed: Thread/unthread right underwear leg;Thread/unthread left underwear leg;Pull underwear up/down;Thread/unthread right pants leg;Thread/unthread left pants leg;Don/Doff right shoe;Don/Doff left shoe;Don/Doff right sock;Don/Doff left sock Lower body dressing/undressing: 4: Steadying Assist  FIM - Toileting Toileting steps completed by patient: Adjust clothing prior to toileting;Performs perineal hygiene Toileting Assistive Devices: Grab bar or rail for support Toileting: 4: Steadying assist  FIM - Diplomatic Services operational officer Devices: Grab bars Toilet Transfers: 4-To toilet/BSC: Min A (steadying Pt. > 75%)  FIM - Bed/Chair Transfer Bed/Chair Transfer Assistive Devices: Therapist, occupational: 4:  Bed > Chair or W/C: Min A (steadying Pt. > 75%)  FIM - Locomotion: Wheelchair Distance: 100 Locomotion: Wheelchair: 2: Travels 50 - 149 ft with minimal assistance (Pt.>75%) FIM - Locomotion:  Ambulation Locomotion: Ambulation Assistive Devices: Walker - Rolling Ambulation/Gait Assistance: 4: Min assist Locomotion: Ambulation: 4: Travels 150 ft or more with minimal assistance (Pt.>75%)  Comprehension Comprehension Mode: Auditory Comprehension: 5-Understands basic 90% of the time/requires cueing < 10% of the time  Expression Expression Mode: Verbal Expression: 5-Expresses basic needs/ideas: With extra time/assistive device  Social Interaction Social Interaction: 5-Interacts appropriately 90% of the time - Needs monitoring or encouragement for participation or interaction.  Problem Solving Problem Solving: 5-Solves basic 90% of the time/requires cueing < 10% of the time  Memory Memory: 5-Recognizes or recalls 90% of the time/requires cueing < 10% of the time  Medical Problem List and Plan:  1. Thrombotic left periventricular,EC infarct  2. DVT Prophylaxis/Anticoagulation: Subcutaneous Lovenox. Monitor platelet counts and any signs of bleeding  3. Pain Management: Tylenol as needed, Celebrex for 1 week total  4. Neuropsych: This patient is capable of making decisions on his own behalf.   - trazodone for sleep 5. Hypertension. Presently on Norvasc 10 mg daily. Monitor with increased mobility.  , hydrochlorothiazide 25 mg daily. Resume  Lisinopril at 20mg  ( 40 mg daily prior to admission. ) (also on tenormin PTA).   -continue to follow and adjust as needed, DBP still elevated  -?celebrex effect 6. 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 Latest urine drug screen negative  7. Hyperlipidemia. Zocor  8. 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  -baclofen 10mg  qid,     -continue ROM, left resting hand splint  9. Postop shoulder pain right side.  Suspect subacromial impingement syndrome. Continue OT/PT, Celebrex for 7 days  LOS (Days) 19 A FACE TO FACE EVALUATION WAS PERFORMED  [name] 06/30/2013, 7:29 AM

## 2013-07-01 ENCOUNTER — Inpatient Hospital Stay (HOSPITAL_COMMUNITY): Payer: Medicare Other | Admitting: Rehabilitation

## 2013-07-01 ENCOUNTER — Inpatient Hospital Stay (HOSPITAL_COMMUNITY): Payer: Medicare Other | Admitting: Occupational Therapy

## 2013-07-01 DIAGNOSIS — I6330 Cerebral infarction due to thrombosis of unspecified cerebral artery: Secondary | ICD-10-CM

## 2013-07-01 DIAGNOSIS — G8110 Spastic hemiplegia affecting unspecified side: Secondary | ICD-10-CM

## 2013-07-01 NOTE — Progress Notes (Signed)
Physical Therapy Session Note  Patient Details  Name: [name] MRN: [id number] Date of Birth: 1961-04-18  Today's Date: 07/01/2013 Time: 4098-1191 Time Calculation (min): 34 min  Short Term Goals: Week 2:  PT Short Term Goal 1 (Week 2): Pt will perform transfers at min assist level. PT Short Term Goal 1 - Progress (Week 2): Met PT Short Term Goal 2 (Week 2): Pt will perform dynamic sitting balance at supervision level PT Short Term Goal 2 - Progress (Week 2): Met PT Short Term Goal 3 (Week 2): Pt will ambulate x 65' with LRAD at max assist PT Short Term Goal 3 - Progress (Week 2): Met PT Short Term Goal 4 (Week 2): Pt will perform dynamic standing balance activities at min assist.  PT Short Term Goal 4 - Progress (Week 2): Progressing toward goal  Skilled Therapeutic Interventions/Progress Updates:   Focus of session was gait training and NMR for improved weight bearing/weight shifting during gait.  Ambulated >150' x 2 to/from room with RW at min assist with cues for upright posture, maintaining position closer to walker, increased step length on LLE.  Note that pt demos increased tremors today in LLE mostly with ambulation.  Had pt ambulate using R handrail and practicing marching with focus on slow controlled movements for increased weight bearing and weight shifting.  Pt with increased difficulty when lifting LLE and note that he would lean into wall to prevent fall.  Also gait trained without use of AD at mod/max assist with focus on slow movements for improved gait pattern.  Pt very anxious about ambulating without AD.  Assisted back to room and into bed with call [place]/phone in place.     Therapy Documentation Precautions:  Precautions Precautions: Fall Precaution Comments: increased extensor tone in L UE/LE, impulsive, ataxic Restrictions Weight Bearing Restrictions: No   Pain: Pain Assessment Pain Assessment: 0-10 Pain Score: 0-No pain   Locomotion  : Ambulation Ambulation/Gait Assistance: 4: Min assist   See FIM for current functional status  Therapy/Group: Individual Therapy  [name] 07/01/2013, 12:26 PM

## 2013-07-01 NOTE — Progress Notes (Signed)
Physical Therapy Session Note  Patient Details  Name: [name] MRN: [id number] Date of Birth: 12/07/1960  Today's Date: 07/01/2013 Time: 4098-1191 Time Calculation (min): 58 min  Short Term Goals: Week 3:  PT Short Term Goal 1 (Week 3): Pt will perform stand pivot transfers with RW at supervision PT Short Term Goal 2 (Week 3): Pt will perform dynamic sitting balance at mod I level PT Short Term Goal 3 (Week 3): Pt will ambulate x 150' w/ LRAD at min/guard assist for steadying PT Short Term Goal 4 (Week 3): Pt will perform dynamic standing balance activities at min assist.   Skilled Therapeutic Interventions/Progress Updates:   Focus of session was gait training on outdoor and community surfaces.  Assisted pt outside via w/c with cousin present for part of session.  Performed gait training >150' up/down several grades of inclines with RW and single handrail, curbs, and stairs with single handrail.  Discussed need for improved weight shift/activation of glutes on RLE to avoid leaning into wall for support.  Also performed side stepping up/down part of incline with handrail.  Requires cues for weight shifting and maintaining trunk in forward alignment.  Pt very appreciative to go outside as he had not been outside yet during his stay.  Pt fatigued at end of session.  Returned to room in w/c with call [place] and phone in reach.    Therapy Documentation Precautions:  Precautions Precautions: Fall Precaution Comments: increased extensor tone in the LLE and LUE Restrictions Weight Bearing Restrictions: No   Pain: Pain Assessment Pain Assessment: Pt continues to have some pain in shoulders and back, but states that he is "learning to relax" more now and it is improved from last week.    Locomotion : Ambulation Ambulation/Gait Assistance: 4: Min assist   See FIM for current functional status  Therapy/Group: Individual Therapy  [name] 07/01/2013, 4:55 PM

## 2013-07-01 NOTE — Progress Notes (Signed)
Patient ID: [name], male   DOB: 1960-12-15, 52 y.o.   MRN: [id number] Subjective/Complaints: 52 y.o. 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 Patient states to be independent prior to admission living with his wife who works day shift. Admitted 06/08/2013 with right-sided weakness. MRI of the brain showed a 1.5 cm region of acute infarction affecting the white matter adjacent to posterior body of the left lateral ventricle with extension into the external capsule region as well as extensive ischemic changes throughout the brain. Carotid Dopplers with less than 40% ICA stenosis. Echocardiogram with ejection fraction of 70% grade 1 diastolic dysfunction. Urine drug screen was negative. Patient did not receive TPA. Neurology services consulted maintained on aspirin therapy for CVA prophylaxis as well as subcutaneous Lovenox for DVT prophylaxis  Had issues regarding privacy with voiding in room.  Needs sup for safety A 12 point review of systems has been performed and if not noted above is otherwise negative.   Objective: Vital Signs: Blood pressure 139/94, pulse 74, temperature 98.2 F (36.8 C), temperature source Oral, resp. rate 18, height 5' 6.14" (1.68 m), weight 81.965 kg (180 lb 11.2 oz), SpO2 96.00%. No results found. No results found for this or any previous visit (from the past 72 hour(s)).   HEENT: normal Cardio: tachy and no murmurs Resp: CTA B/L and unlabored GI: BS positive and non tender Extremity:  Pulses positive and No Edema Skin:   Intact Neuro: Alert/Oriented, Cranial Nerve II-XII normal, Abnormal Sensory decreased LUE/LLE LT (distal more so than prox), Abnormal Motor 3-/5 L delt,bi,tri,grip,3+ HF,KE ADF,4/5 on R side  Abnormal FMC Ataxic/ dec FMC and Tone  Hypertonic on L side 2-3/4, Tone:  normal tone on R side and Dysarthric Musc/Skel:  No R shoulder pain with  overhead activity, - impingement signs Gen NAD   Assessment/Plan: 1. 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 Physiatrist is providing close team supervision and 24 hour management of active medical problems listed below. Physiatrist and rehab team continue to assess barriers to discharge/monitor patient progress toward functional and medical goals. FIM: FIM - Bathing Bathing Steps Patient Completed: Chest;Right Arm;Left Arm;Abdomen;Front perineal area;Buttocks;Right upper leg;Left upper leg;Left lower leg (including foot);Right lower leg (including foot) Bathing: 5: Set-up assist to: Obtain items  FIM - Upper Body Dressing/Undressing Upper body dressing/undressing steps patient completed: Thread/unthread right sleeve of pullover shirt/dresss;Thread/unthread left sleeve of pullover shirt/dress;Put head through opening of pull over shirt/dress;Pull shirt over trunk Upper body dressing/undressing: 5: Supervision: Safety issues/verbal cues FIM - Lower Body Dressing/Undressing Lower body dressing/undressing steps patient completed: Thread/unthread right underwear leg;Thread/unthread left underwear leg;Pull underwear up/down;Thread/unthread right pants leg;Thread/unthread left pants leg;Don/Doff right sock;Don/Doff left sock Lower body dressing/undressing: 4: Steadying Assist  FIM - Toileting Toileting steps completed by patient: Adjust clothing prior to toileting;Performs perineal hygiene Toileting Assistive Devices: Grab bar or rail for support Toileting: 4: Steadying assist  FIM - Diplomatic Services operational officer Devices: Grab bars Toilet Transfers: 4-To toilet/BSC: Min A (steadying Pt. > 75%)  FIM - Bed/Chair Transfer Bed/Chair Transfer Assistive Devices: HOB elevated;Bed rails Bed/Chair  Transfer: 5: Supine > Sit: Supervision (verbal cues/safety issues);4: Bed > Chair or W/C: Min A (steadying Pt. > 75%);4: Chair or W/C > Bed: Min A (steadying  Pt. > 75%)  FIM - Locomotion: Wheelchair Distance: 100 Locomotion: Wheelchair: 2: Travels 50 - 149 ft with minimal assistance (Pt.>75%) FIM - Locomotion: Ambulation Locomotion: Ambulation Assistive Devices: Designer, industrial/product Ambulation/Gait Assistance: 4: Min assist Locomotion: Ambulation: 4: Travels 150 ft or more with minimal assistance (Pt.>75%)  Comprehension Comprehension Mode: Auditory Comprehension: 5-Understands basic 90% of the time/requires cueing < 10% of the time  Expression Expression Mode: Verbal Expression: 5-Expresses basic needs/ideas: With extra time/assistive device  Social Interaction Social Interaction: 5-Interacts appropriately 90% of the time - Needs monitoring or encouragement for participation or interaction.  Problem Solving Problem Solving: 5-Solves basic 90% of the time/requires cueing < 10% of the time  Memory Memory: 5-Recognizes or recalls 90% of the time/requires cueing < 10% of the time  Medical Problem List and Plan:  1. Thrombotic left periventricular,EC infarct  2. DVT Prophylaxis/Anticoagulation: Subcutaneous Lovenox. Monitor platelet counts and any signs of bleeding  3. Pain Management: Tylenol as needed, Celebrex for 1 week total  4. Neuropsych: This patient is capable of making decisions on his own behalf.   - trazodone for sleep 5. Hypertension. Presently on Norvasc 10 mg daily. Monitor with increased mobility.  , hydrochlorothiazide 25 mg daily. Resume  Lisinopril at 20mg  ( 40 mg daily prior to admission. ) (also on tenormin PTA).   -continue to follow and adjust as needed, DBP still elevated  -?celebrex effect 6. 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 Latest urine drug screen negative  7. Hyperlipidemia. Zocor  8. Spasticity, chronic left sided tone  from remote cva.  -baclofen 10mg  qid,     -continue ROM, left resting hand splint  9. Postop shoulder pain . 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 Continue OT/PT, Celebrex for 7 days, also has chronic left shoulder pain  LOS (Days) 20 A FACE TO FACE EVALUATION WAS PERFORMED  Lucienne Sawyers E 07/01/2013, 8:29 AM

## 2013-07-01 NOTE — Progress Notes (Signed)
Occupational Therapy Session Note  Patient Details  Name: [name] MRN: [id number] Date of Birth: 04/06/1961  Today's Date: 07/01/2013 Time: 4098-1191 Time Calculation (min): 34 min  Short Term Goals: Week 3:  OT Short Term Goal 1 (Week 3): Pt will perform bathing sit to stand in the shower with AE and supervison. OT Short Term Goal 2 (Week 3): Pt will peform dressing sit to stand at the sink with supervision. OT Short Term Goal 3 (Week 3): Pt will perform toilet transfers stand/squat pivot with supervision. OT Short Term Goal 4 (Week 3): Pt will perform shower transfers with close supervision  Skilled Therapeutic Interventions/Progress Updates:    Pt performed shower this morning with min assist to walk to the shower seat and toilet using the RW.  Pt with initial LOB when standing from EOB.  Needs min instructional cueing to position feet up under him further to assist with sit to stand.  Performed dressing with min assist as well at the sink.  He needed mod assist for donning the left shoe with the blue rocker AFO in place.  Decreased ability to get his heel in the shoe.  Will try next time with use of a long handle shoe horn.    Therapy Documentation Precautions:  Precautions Precautions: Fall Precaution Comments: increased extensor tone in the LLE and LUE Restrictions Weight Bearing Restrictions: No  Pain: Pain Assessment Pain Assessment: 0-10 Pain Score: 0-No pain ADL:  Other Treatments:   See FIM for current functional status  Therapy/Group: Individual Therapy  Meah Jiron OTR/L 07/01/2013, 12:29 PM

## 2013-07-01 NOTE — Progress Notes (Signed)
Occupational Therapy Session Note  Patient Details  Name: [name] MRN: [id number] Date of Birth: [birth year]  Today's Date: 07/01/2013 Time: 4098-1191 Time Calculation (min): 45 min  Skilled Therapeutic Interventions/Progress Updates:    Began session working on toilet transfer with use of the RW.  Pt able to ambulate to the toilet with min assist and stand to urinate with min guard for balance and use of the grab bar.  Pt then used to wheelchair to mobilize to the rehab gym.  Worked on stretching the left trunk and activation of the left arm with reaching task from sitting position.  Pt with increased tone in the pectoral and biceps limiting his ability to reach out in front and place large sized pegs.  While reaching incorporated neuro re-ed to his trunk and LLE.  Encouraged pt to reach toward his left and forward for pegs to increase weightbearing and use of the LLE for sit to stand.  He tends to perform sit to stand with increased lean and reliance on the right leg instead of maintaining forward weightshift at midline.  Pt needing min to mod facilitation to achieve neutral trunk position and maintain for sit to stand.  Also with need for min assist when reaching out to table secondary to only having approximately 80-90 degrees shoulder flexion.    Therapy Documentation Precautions:  Precautions Precautions: Fall Precaution Comments: increased extensor tone in the LLE and LUE Restrictions Weight Bearing Restrictions: No  Pain: Pain Assessment Pain Assessment: No/denies pain  See FIM for current functional status  Therapy/Group: Individual Therapy  Saahil Herbster OTR/L 07/01/2013, 3:41 PM

## 2013-07-02 ENCOUNTER — Inpatient Hospital Stay (HOSPITAL_COMMUNITY): Payer: Medicare Other | Admitting: Occupational Therapy

## 2013-07-02 ENCOUNTER — Inpatient Hospital Stay (HOSPITAL_COMMUNITY): Payer: Medicare Other | Admitting: Rehabilitation

## 2013-07-02 DIAGNOSIS — I6330 Cerebral infarction due to thrombosis of unspecified cerebral artery: Secondary | ICD-10-CM

## 2013-07-02 DIAGNOSIS — G8110 Spastic hemiplegia affecting unspecified side: Secondary | ICD-10-CM

## 2013-07-02 MED ORDER — BACLOFEN 20 MG PO TABS
20.0000 mg | ORAL_TABLET | Freq: Four times a day (QID) | ORAL | Status: DC
  Administered 2013-07-02 – 2013-07-04 (×10): 20 mg via ORAL
  Filled 2013-07-02 (×17): qty 1

## 2013-07-02 NOTE — Progress Notes (Signed)
Physical Therapy Session Note  Patient Details  Name: [name] MRN: [id number] Date of Birth: 03-Jul-1961  Today's Date: 07/02/2013 Time: 6213-0865 Time Calculation (min): 27 min  Short Term Goals: Week 3:  PT Short Term Goal 1 (Week 3): Pt will perform stand pivot transfers with RW at supervision PT Short Term Goal 2 (Week 3): Pt will perform dynamic sitting balance at mod I level PT Short Term Goal 3 (Week 3): Pt will ambulate x 150' w/ LRAD at min/guard assist for steadying PT Short Term Goal 4 (Week 3): Pt will perform dynamic standing balance activities at min assist.   Skilled Therapeutic Interventions/Progress Updates:   Focus of session was NMR for upright posture while providing equal, slow controlled weight shifts.  Performed stepping on Kinetron in standing position with moderate resistance to encourage slower stepping pattern.  Requires B UE support on handrails for safety with manual assist for upright posture with cues for posture, glute activation, slow and equal steps.  Pt with increased difficulty to complete task, however was able to perform about 10 steps in a row with improved technique.  Pt very determined to perform task well.  Returned to room in w/c with call [place] in place to await OT.   Therapy Documentation Precautions:  Precautions Precautions: Fall Precaution Comments: increased extensor tone in the LLE and LUE Restrictions Weight Bearing Restrictions: No   Pain:  No c/o pain during this session.     See FIM for current functional status  Therapy/Group: Individual Therapy  [name] 07/02/2013, 2:21 PM

## 2013-07-02 NOTE — Progress Notes (Signed)
Patient ID: [name], male   DOB: 11/29/1960, 52 y.o.   MRN: [id number] Subjective/Complaints: 52 y.o. 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 Patient states to be independent prior to admission living with his wife who works day shift. Admitted 06/08/2013 with right-sided weakness. MRI of the brain showed a 1.5 cm region of acute infarction affecting the white matter adjacent to posterior body of the left lateral ventricle with extension into the external capsule region as well as extensive ischemic changes throughout the brain. Carotid Dopplers with less than 40% ICA stenosis. Echocardiogram with ejection fraction of 70% grade 1 diastolic dysfunction. Urine drug screen was negative. Patient did not receive TPA. Neurology services consulted maintained on aspirin therapy for CVA prophylaxis as well as subcutaneous Lovenox for DVT prophylaxis  LUE spasticity is main c/o A 12 point review of systems has been performed and if not noted above is otherwise negative.   Objective: Vital Signs: Blood pressure 136/83, pulse 69, temperature 98 F (36.7 C), temperature source Oral, resp. rate 18, height 5' 6.14" (1.68 m), weight 81.965 kg (180 lb 11.2 oz), SpO2 99.00%. No results found. No results found for this or any previous visit (from the past 72 hour(s)).   HEENT: normal Cardio: tachy and no murmurs Resp: CTA B/L and unlabored GI: BS positive and non tender Extremity:  Pulses positive and No Edema Skin:   Intact Neuro: Alert/Oriented, Cranial Nerve II-XII normal, Abnormal Sensory decreased LUE/LLE LT (distal more so than prox), Abnormal Motor 3-/5 L delt,bi,tri,grip,3+ HF,KE ADF,4/5 on R side  Abnormal FMC Ataxic/ dec FMC and Tone  Hypertonic on L side 2-3/4 mainly arm and wrist and finger flexors, Tone:  normal tone on R side and Dysarthric Musc/Skel:  No R shoulder pain with overhead  activity, - impingement signs Gen NAD   Assessment/Plan: 1. 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 Physiatrist is providing close team supervision and 24 hour management of active medical problems listed below. Physiatrist and rehab team continue to assess barriers to discharge/monitor patient progress toward functional and medical goals. FIM: FIM - Bathing Bathing Steps Patient Completed: Chest;Right Arm;Left Arm;Abdomen;Front perineal area;Buttocks;Right upper leg;Left upper leg Bathing: 4: Min-Patient completes 8-9 51f 10 parts or 75+ percent  FIM - Upper Body Dressing/Undressing Upper body dressing/undressing steps patient completed: Thread/unthread right sleeve of pullover shirt/dresss;Thread/unthread left sleeve of pullover shirt/dress;Put head through opening of pull over shirt/dress;Pull shirt over trunk Upper body dressing/undressing: 5: Supervision: Safety issues/verbal cues FIM - Lower Body Dressing/Undressing Lower body dressing/undressing steps patient completed: Thread/unthread right underwear leg;Thread/unthread left underwear leg;Pull underwear up/down;Thread/unthread right pants leg;Thread/unthread left pants leg;Pull pants up/down;Don/Doff right shoe Lower body dressing/undressing: 4: Min-Patient completed 75 plus % of tasks  FIM - Toileting Toileting steps completed by patient: Adjust clothing prior to toileting;Performs perineal hygiene;Adjust clothing after toileting Toileting Assistive Devices: Grab bar or rail for support Toileting: 4: Steadying assist  FIM - Diplomatic Services operational officer Devices: Grab bars;Elevated toilet seat Toilet Transfers: 4-To toilet/BSC: Min A (steadying Pt. > 75%);4-From toilet/BSC: Min A (steadying Pt. > 75%)  FIM - Bed/Chair  Transfer Bed/Chair Transfer Assistive Devices: Bed rails Bed/Chair Transfer: 5: Sit > Supine: Supervision (verbal cues/safety issues);4: Bed > Chair or W/C: Min A (steadying  Pt. > 75%)  FIM - Locomotion: Wheelchair Distance: 100 Locomotion: Wheelchair: 2: Travels 50 - 149 ft with minimal assistance (Pt.>75%) FIM - Locomotion: Ambulation Locomotion: Ambulation Assistive Devices: Designer, industrial/product Ambulation/Gait Assistance: 4: Min assist Locomotion: Ambulation: 4: Travels 150 ft or more with minimal assistance (Pt.>75%)  Comprehension Comprehension Mode: Auditory Comprehension: 5-Understands complex 90% of the time/Cues < 10% of the time  Expression Expression Mode: Verbal Expression: 5-Expresses basic needs/ideas: With extra time/assistive device  Social Interaction Social Interaction: 5-Interacts appropriately 90% of the time - Needs monitoring or encouragement for participation or interaction.  Problem Solving Problem Solving: 5-Solves basic problems: With no assist  Memory Memory: 5-Recognizes or recalls 90% of the time/requires cueing < 10% of the time  Medical Problem List and Plan:  1. Thrombotic left periventricular,EC infarct  2. DVT Prophylaxis/Anticoagulation: Subcutaneous Lovenox. Monitor platelet counts and any signs of bleeding  3. Pain Management: Tylenol as needed, Celebrex for 1 week total  4. Neuropsych: This patient is capable of making decisions on his own behalf.   - trazodone for sleep 5. Hypertension. Presently on Norvasc 10 mg daily. Monitor with increased mobility.  , hydrochlorothiazide 25 mg daily. Resume  Lisinopril at 20mg  ( 40 mg daily prior to admission. ) (also on tenormin PTA).   -continue to follow and adjust as needed, DBP still elevated  -?celebrex effect 6. 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 Latest urine drug screen negative  7. Hyperlipidemia. Zocor  8. 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   - increase baclofen to 20mg  qid,     -continue ROM, left resting hand splint  9. Postop shoulder pain . 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 Continue OT/PT, finished Celebrex , also has chronic left shoulder pain  LOS (Days) 21 A FACE TO FACE EVALUATION WAS PERFORMED  Analeia Ismael E 07/02/2013, 8:47 AM

## 2013-07-02 NOTE — Progress Notes (Signed)
Occupational Therapy Session Note  Patient Details  Name: [name] MRN: [id number] Date of Birth: 20-Apr-1961  Today's Date: 07/02/2013 Time: 4098-1191 Time Calculation (min): 47 min  Skilled Therapeutic Interventions/Progress Updates:    Pt worked in supine initially on stretching of the left pectoral into abduction and external rotation.  Only able to tolerate approximately 100 degrees of shoulder flexion in this position secondary to tightness and increased tone.  Also performed scapular mobilizations to depression and adduction as well.  Pt worked on bilateral shoulder flexion in supine using therapy ball and dowel rod.  Emphasis on maintaining elbow extension with shoulder flexion.  Pt only able to tolerate and perform 0-100 degrees as therapist helped depress scapula.  Pt with increased difficulty maintaining left hand on the ball secondary to increased tone in the shoulder internal rotators as flexion increased.  Finished in sitting with pt working on reaching with the LUE to grasp rings and perform AROM arc to move them from one side of the arc to the other.  Positioned arc to help influence left trunk elongation when reaching as pt usually holds in collapsed position on this side in standing or with mobility.  Pt needing min facilitation to reach to the left side and maintain balance.    Therapy Documentation Precautions:  Precautions Precautions: Fall Precaution Comments: increased extensor tone in the LLE and LUE Restrictions Weight Bearing Restrictions: No  Pain:  No report of pain during session  See FIM for current functional status  Therapy/Group: Individual Therapy  Saleena Tamas OTR/L 07/02/2013, 3:47 PM

## 2013-07-02 NOTE — Progress Notes (Signed)
Physical Therapy Session Note  Patient Details  Name: [name] MRN: [id number] Date of Birth: 10/04/1961  Today's Date: 07/02/2013 Time: 1500-1600 Time Calculation (min): 60 min  Short Term Goals: Week 3:  PT Short Term Goal 1 (Week 3): Pt will perform stand pivot transfers with RW at supervision PT Short Term Goal 2 (Week 3): Pt will perform dynamic sitting balance at mod I level PT Short Term Goal 3 (Week 3): Pt will ambulate x 150' w/ LRAD at min/guard assist for steadying PT Short Term Goal 4 (Week 3): Pt will perform dynamic standing balance activities at min assist.   Skilled Therapeutic Interventions/Progress Updates:   Focus of session continues to be gait training and NMR for improved weight shift, balance and trunk control.  Performed Kinetron x 7 mins (with intermittent rest breaks) in standing position with assist to maintain upright stance and prevent L trunk rotation.  Also requires cues for posture and maintaining foot placement.  Performed treadmill gait training x 10 mins (with rest break in between) at 0% incline and speed of .5 mph with focus on decreased trunk rotation to L side (tends to maintain rather than rotating pelvis with each step). Also focused on increased weight shift to L LE, more heel to toe gait pattern on L foot and wider steps on R LE.  Provided cues and manual facilitation for these to occur.  Performed standing dynamic balance activity at // bars involving reaching and placing rings to target to encourage increased weight shift and increased trunk rotation.  Assisted pt somewhat with weight shifts and to help with L shoulder flex, scapular retraction.  Pt states shoulders are tight at end of session, therefore provided with heat packs for B shoulders and returned pt to room and in bed with bed alarm set and call Mangen in place.    Therapy Documentation Precautions:  Precautions Precautions: Fall Precaution Comments: increased extensor tone in the LLE  and LUE Restrictions Weight Bearing Restrictions: No   Vital Signs: Therapy Vitals Temp: 98.5 F (36.9 C) Temp src: Oral Pulse Rate: 87 Resp: 18 BP: 114/83 mmHg Patient Position, if appropriate: Sitting Oxygen Therapy SpO2: 99 % O2 Device: None (Room air) Pain: Pt c/o pain/tightness in B shoulders, therefore applied heat packs to B shoulders at end of session.    Locomotion : Ambulation Ambulation/Gait Assistance: 4: Min assist   See FIM for current functional status  Therapy/Group: Individual Therapy  [name] 07/02/2013, 4:29 PM

## 2013-07-02 NOTE — Progress Notes (Signed)
Occupational Therapy Session Note  Patient Details  Name: [name] MRN: [id number] Date of Birth: 10-14-1961  Today's Date: 07/02/2013 Time: 4098-1191 Time Calculation (min): 60 min  Short Term Goals: Week 3:  OT Short Term Goal 1 (Week 3): Pt will perform bathing sit to stand in the shower with AE and supervison. OT Short Term Goal 2 (Week 3): Pt will peform dressing sit to stand at the sink with supervision. OT Short Term Goal 3 (Week 3): Pt will perform toilet transfers stand/squat pivot with supervision. OT Short Term Goal 4 (Week 3): Pt will perform shower transfers with close supervision  Skilled Therapeutic Interventions/Progress Updates:     Pt performed bathing and dressing this session.  Performed transfer to the shower seat with min assist for initial sit to stand.  Demonstrated increased LOB posteriorly in standing.  Pt performed shower sit to stand, needing encouragement to attempt LUE use more during session.  Still with significant tone in the left elbow and digits with attempted use.  Close supervision for standing with use of the grab bar to wash peri area.  Performed dressing sit to stand at the sink with min assist for standing balance while pulling pants and underpants over his hips.  Pt needed min instructional cueing to use the edge of the sink to assist with balance instead of grabbing the faucet.  Pt also performed shaving in standing as well but tends to try and prop his hips up against the sink instead of balancing himself.    Therapy Documentation Precautions:  Precautions Precautions: Fall Precaution Comments: increased extensor tone in the LLE and LUE Restrictions Weight Bearing Restrictions: No  Pain: Pain Assessment Pain Assessment: No/denies pain Pain Score: 0-No pain ADL: See FIM for current functional status  Therapy/Group: Individual Therapy  Hines Kloss OTR/L 07/02/2013, 12:22 PM

## 2013-07-03 ENCOUNTER — Inpatient Hospital Stay (HOSPITAL_COMMUNITY): Payer: Medicare Other | Admitting: Rehabilitation

## 2013-07-03 ENCOUNTER — Inpatient Hospital Stay (HOSPITAL_COMMUNITY): Payer: Medicare Other | Admitting: Occupational Therapy

## 2013-07-03 NOTE — Progress Notes (Addendum)
Physical Therapy Session Note  Patient Details  Name: [name] MRN: [id number] Date of Birth: Mar 29, 1961  Today's Date: 07/03/2013 Time: 4401-0272 Time Calculation (min): 58 min  Short Term Goals: Week 3:  PT Short Term Goal 1 (Week 3): Pt will perform stand pivot transfers with RW at supervision PT Short Term Goal 2 (Week 3): Pt will perform dynamic sitting balance at mod I level PT Short Term Goal 3 (Week 3): Pt will ambulate x 150' w/ LRAD at min/guard assist for steadying PT Short Term Goal 4 (Week 3): Pt will perform dynamic standing balance activities at min assist.   Skilled Therapeutic Interventions/Progress Updates:   Focus of session was NMR for improved trunk control and weight shifting, w/c propulsion for reciprocating movements and gait training with small based quad cane (only one available in gym at time).  Performed standing activity while reaching posterior/laterally for slow controlled trunk rotation to both sides. Pt requires min to mod assist at times to prevent LOB.  Manual cues for proper weight shift and trunk rotation with cues for the same and to work on increasing L elbow extension when placing cone to L side.  Pt with increased difficulty, therefore performed in sitting with improved trunk rotation noted.  Performed gait training with small based quad cane in RUE at mod assist with cues for sequencing/technique with quad cane, upright posture and increased stride length.  Pt states he does not like quad cane due to instability.  May work on ambulation with hemi walker if available during next session,.  W/c propulsion x 100' with focus on increased hamstring activation and reciprocating pattern.  Pt with increased difficulty utilizing LLE.    Therapy Documentation Precautions:  Precautions Precautions: Fall Precaution Comments: increased extensor tone in the LLE and LUE Restrictions Weight Bearing Restrictions: No   Pain: Pain Assessment Pain Assessment:  No/denies pain   Locomotion : Ambulation Ambulation/Gait Assistance: 4: Min assist;3: Mod assist Wheelchair Mobility Distance: 100'   See FIM for current functional status  Therapy/Group: Individual Therapy  [name] 07/03/2013, 4:37 PM

## 2013-07-03 NOTE — Patient Care Conference (Signed)
Inpatient RehabilitationTeam Conference and Plan of Care Update Date: 07/03/2013   Time: 10:55 Am    Patient Name: [name]      Medical Record Number: [id number]  Date of Birth: 1961-01-09 Sex: Male         Room/Bed: 4W03C/4W03C-01 Payor Info: Payor: Joette Catching MEDICARE / Plan: [employer] MEDICARE / Product Type: *No Product type* /    Admitting Diagnosis: LT CVA  Admit Date/Time:  06/11/2013  4:56 PM Admission Comments: No comment available   Primary Diagnosis:  Thrombotic cerebral infarction Principal Problem: Thrombotic cerebral infarction  Patient Active Problem List   Diagnosis Date Noted  . 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 CVA (cerebral infarction) 06/08/2013  . Special screening for malignant neoplasms, colon 04/05/2013  . Tick bite 04/01/2013  . Microcytosis 01/08/2013  . Screening for viral disease 01/04/2013  . Plantar fasciitis 11/20/2012  . Shoulder pain 04/10/2012  . Osteoarthritis 03/27/2012  . BPH (benign prostatic hyperplasia) 06/06/2011  . Preventative health care 06/06/2011  . CVA (cerebral vascular accident) 05/06/2011  . TORTICOLLIS, SPASMODIC 04/27/2007  . ERECTILE DYSFUNCTION 09/19/2006  . DRUG ABUSE, HX OF 09/19/2006  . HYPERLIPIDEMIA 09/18/2006  . HYPERTENSION 09/18/2006    Expected Discharge Date: Expected Discharge Date: 07/09/13  Team Members Present: Physician leading conference: Dr. Claudette Laws Social Worker Present: Staci Acosta, LCSW;Becky Takuya Lariccia, LCSW Nurse Present: Gregor Hams, RN PT Present: Edman Circle, PT;Bridgett Ripa, PT;Caroline Sonia Side, PT OT Present: Rosalio Loud, OT;Kayla Perkinson, OT;Karen Cameron Sprang, Heath Lark, OT SLP Present: Fae Pippin, SLP     Current Status/Progress Goal Weekly Team Focus  Medical   Blood pressure well controlled, right shoulder pain improved, left-sided spasticity  Improve spasticity on left side  Neuromuscular reeducation, family teacher    Bowel/Bladder   continent o bowel and bladder  remain continent of bowel and bladder with min assist  cont to monitor   Swallow/Nutrition/ Hydration     na        ADL's   Min assist to min guard for toileting, shower transfers.  Needs min assist for LB dressing but only setup for UB dressing.  Goals are overall supervision for toileting, bathing, dressing, and modified independent for grooming and feeding.  Neuro re-ed for the trunk LUE and LLE, techniques to help decrease tone, functional transfers, standing balance.   Mobility   min assist to close SBA for transfers, min assist for ambulation with RW  supervision to min assist overall   slow controlled movements, dynamic standing balance, NMR for trunk and LLE/UE, gait training, safety.    Communication     na        Safety/Cognition/ Behavioral Observations    na        Pain             Skin   no s/s infection /breakdown  no skin infection/breakdown  monitor/assess skin q shift    Rehab Goals Patient on target to meet rehab goals: Yes *See Care Plan and progress notes for long and short-term goals.  Barriers to Discharge: See above    Possible Resolutions to Barriers:  See above    Discharge Planning/Teaching Needs:  Family to provide 24 hr care upon discharge-need to do family education with wife and son      Team Discussion:  Increase baclofen qid.  On target to reach his goals-family education end of week  Revisions to Treatment Plan:  None   Continued Need for Acute Rehabilitation Level of  Care: The patient requires daily medical management by a physician with specialized training in physical medicine and rehabilitation for the following conditions: Daily direction of a multidisciplinary physical rehabilitation program to ensure safe treatment while eliciting the highest outcome that is of practical value to the patient.: Yes Daily medical management of patient stability for increased activity during participation  in an intensive rehabilitation regime.: Yes Daily analysis of laboratory values and/or radiology reports with any subsequent need for medication adjustment of medical intervention for : Neurological problems  Hawthorne Day, Lemar Livings 07/03/2013, 3:53 PM

## 2013-07-03 NOTE — Progress Notes (Signed)
Physical Therapy Session Note  Patient Details  Name: [name] MRN: [id number] Date of Birth: Nov 01, 1961  Today's Date: 07/03/2013 Time: 1001-1044 Time Calculation (min): 43 min  Short Term Goals: Week 3:  PT Short Term Goal 1 (Week 3): Pt will perform stand pivot transfers with RW at supervision PT Short Term Goal 2 (Week 3): Pt will perform dynamic sitting balance at mod I level PT Short Term Goal 3 (Week 3): Pt will ambulate x 150' w/ LRAD at min/guard assist for steadying PT Short Term Goal 4 (Week 3): Pt will perform dynamic standing balance activities at min assist.   Skilled Therapeutic Interventions/Progress Updates:   Pt in room with son when PT arrived.  Pt states he wishes to use urinal before going to therapy gym and wants to stand, therefore provided stand by to min/guard assist when standing to use urinal.  Performed high level dynamic standing balance activities in gym.   Performed basketball activity in standing with feet apart then progressed to feet staggered (modified tandem) at intermittent min assist to steady and prevent LOB.  Focused on utilizing LUE when shooting ball as much as possible.  Pt with increased frustration when trying to use LUE.  Also performed standing activity hitting ball coming from varying directions.  Requires min to mod assist at times to prevent LOB.  Provided some assist at scapula for improved movement of LUE during activity.  Pt tends to get into laughing fits with son and loses balance more readily.    Therapy Documentation Precautions:  Precautions Precautions: Fall Precaution Comments: increased extensor tone in the LLE and LUE Restrictions Weight Bearing Restrictions: No   Pain: Pain Assessment Pain Assessment: No/denies pain Mobility:   See FIM for current functional status  Therapy/Group: Individual Therapy  [name] 07/03/2013, 11:18 AM

## 2013-07-03 NOTE — Progress Notes (Signed)
Occupational Therapy Session Note  Patient Details  Name: [name] MRN: [id number] Date of Birth: March 08, 1961  Today's Date: 07/03/2013 Time: 7829-5621 Time Calculation (min): 34 min  Skilled Therapeutic Interventions/Progress Updates:    Pt worked on Chief of Staff for the LLE and trunk.  He demonstrates decreased ability to perform midline weightshift during sit to stand and tends to rely more on the right side to achieve standing.  Facilitated pt's left hip and trunk to help align his body with standing and transitional stepping with the LLE.  Pt demonstrates decreased body awareness and tends to fight against weightshifting to the left in standing as well.  Increased tone in the LUE and LLE increases abnormal body alignment and posture with standing.  Pt exhibits increased shoulder hike on the right side and right side trunk elongation.    Therapy Documentation Precautions:  Precautions Precautions: Fall Precaution Comments: increased extensor tone in the LLE and LUE Restrictions Weight Bearing Restrictions: No  Pain: Pain Assessment Pain Assessment: No/denies pain ADL: See FIM for current functional status  Therapy/Group: Individual Therapy  Vaniah Chambers OTR/L 07/03/2013, 3:46 PM

## 2013-07-03 NOTE — Progress Notes (Signed)
Occupational Therapy Weekly Progress Note  Patient Details  Name: [name] MRN: [id number] Date of Birth: 01/03/1961  Today's Date: 07/03/2013 Time: 0800-0900 Time Calculation (min): 60 min  Patient has met 0 of 4 short term goals.  Mr. Akhtar continues to need min assist level for selfcare tasks,  bathing and dressing.  He continues to demonstrate moderate flexor tone in the LUE and LLE with all activities.  Have been focusing on decreasing tone and decreasing abnormal movement patterns in hopes of increasing LLE and LUE function.    Patient continues to demonstrate the following deficits: decreased balance, increased tone, decreased strength, and therefore will continue to benefit from skilled OT intervention to enhance overall performance with BADL.  Patient progressing toward long term goals..  Continue plan of care.  OT Short Term Goals Week 4:  OT Short Term Goal 1 (Week 4): Pt will perform bathing sit to stand in the shower with AE and supervison. OT Short Term Goal 2 (Week 4): Patient will perform LB dressing to include sit and stand with min assist OT Short Term Goal 3 (Week 4): Pt will perform toilet transfers stand/squat pivot with supervision. OT Short Term Goal 4 (Week 4): Pt will perform shower transfers with close supervision  Skilled Therapeutic Interventions/Progress Updates:    Pt performed bathing and dressing sit to stand this session.  Pt still needs min assist for shower transfers using the RW and min guard for bathing sit to stand using grab bar for support.  Pt is overall min assist for LB dressing secondary to donning left AFO and shoe.  Attempted use of shoe horn this session but pt still needed assistance for the shoe.  Therapist also applied new velcro strip to AFO secondary to a piece being worn off.  Pt still needing min assist with mod instructional cueing for positioning of the left foot prior to sit to stand.    Therapy Documentation Precautions:   Precautions Precautions: Fall Precaution Comments: increased extensor tone in the LLE and LUE Restrictions Weight Bearing Restrictions: No  Pain: Pain Assessment Pain Assessment: No/denies pain Pain Score: 0-No pain ADL: See FIM for current functional status  Therapy/Group: Individual Therapy  Yulian Gosney OTR/L 07/03/2013, 10:59 AM

## 2013-07-03 NOTE — Progress Notes (Signed)
Patient ID: [name], male   DOB: 1960/12/31, 52 y.o.   MRN: [id number] Subjective/Complaints: 52 y.o. 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 Patient states to be independent prior to admission living with his wife who works day shift. Admitted 06/08/2013 with right-sided weakness. MRI of the brain showed a 1.5 cm region of acute infarction affecting the white matter adjacent to posterior body of the left lateral ventricle with extension into the external capsule region as well as extensive ischemic changes throughout the brain. Carotid Dopplers with less than 40% ICA stenosis. Echocardiogram with ejection fraction of 70% grade 1 diastolic dysfunction. Urine drug screen was negative. Patient did not receive TPA. Neurology services consulted maintained on aspirin therapy for CVA prophylaxis as well as subcutaneous Lovenox for DVT prophylaxis  Right shoulder pain resolved patient is aware that he is soft Celebrex now. Patient complaining of stiffness in the left upper extremity we did explain that this was old stroke and not likely to have any spontaneous recovery. A 12 point review of systems has been performed and if not noted above is otherwise negative.   Objective: Vital Signs: Blood pressure 138/86, pulse 83, temperature 98.1 F (36.7 C), temperature source Oral, resp. rate 18, height 5' 6.14" (1.68 m), weight 81.1 kg (178 lb 12.7 oz), SpO2 98.00%. No results found. No results found for this or any previous visit (from the past 72 hour(s)).   HEENT: normal Cardio: tachy and no murmurs Resp: CTA B/L and unlabored GI: BS positive and non tender Extremity:  Pulses positive and No Edema Skin:   Intact Neuro: Alert/Oriented, Cranial Nerve II-XII normal, Abnormal Sensory decreased LUE/LLE LT (distal more so than prox), Abnormal Motor 3-/5 L delt,bi,tri,grip,3+ HF,KE ADF,4/5 on R  side  Abnormal FMC Ataxic/ dec FMC and Tone  Hypertonic on L side 2-3/4 mainly arm and wrist and finger flexors, Tone:  normal tone on R side and Dysarthric Musc/Skel:  No R shoulder pain with overhead activity, - impingement signs Gen NAD   Assessment/Plan: 1. 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 Physiatrist is providing close team supervision and 24 hour management of active medical problems listed below. Physiatrist and rehab team continue to assess barriers to discharge/monitor patient progress toward functional and medical goals. Team conference today please see physician documentation under team conference tab, met with team face-to-face to discuss problems,progress, and goals. Formulized individual treatment plan based on medical history, underlying problem and comorbidities. FIM: FIM - Bathing Bathing Steps Patient Completed: Chest;Right Arm;Left Arm;Abdomen;Front perineal area;Buttocks;Right upper leg;Left upper leg Bathing: 4: Min-Patient completes 8-9 40f 10 parts or 75+ percent  FIM - Upper Body Dressing/Undressing Upper body dressing/undressing steps patient completed: Thread/unthread right sleeve of pullover shirt/dresss;Thread/unthread left sleeve of pullover shirt/dress;Put head through opening of pull over shirt/dress;Pull shirt over trunk Upper body dressing/undressing: 5: Supervision: Safety issues/verbal cues FIM - Lower Body Dressing/Undressing Lower body dressing/undressing steps patient completed: Thread/unthread right underwear leg;Thread/unthread left underwear leg;Pull underwear up/down;Thread/unthread right pants leg;Thread/unthread left pants leg;Don/Doff right shoe;Pull pants up/down Lower body dressing/undressing: 4: Min-Patient completed 75 plus % of tasks  FIM -  Toileting Toileting steps completed by patient: Adjust clothing prior to toileting;Performs perineal hygiene;Adjust clothing after toileting Toileting Assistive Devices: Grab bar or rail  for support Toileting: 4: Steadying assist  FIM - Diplomatic Services operational officer Devices: Grab bars;Elevated toilet seat Toilet Transfers: 4-To toilet/BSC: Min A (steadying Pt. > 75%);4-From toilet/BSC: Min A (steadying Pt. > 75%)  FIM - Bed/Chair Transfer Bed/Chair Transfer Assistive Devices: Bed rails Bed/Chair Transfer: 5: Sit > Supine: Supervision (verbal cues/safety issues);4: Chair or W/C > Bed: Min A (steadying Pt. > 75%)  FIM - Locomotion: Wheelchair Distance: 100 Locomotion: Wheelchair: 2: Travels 50 - 149 ft with minimal assistance (Pt.>75%) FIM - Locomotion: Ambulation Locomotion: Ambulation Assistive Devices: Other (comment) (treadmill) Ambulation/Gait Assistance: 4: Min assist Locomotion: Ambulation: 4: Travels 150 ft or more with minimal assistance (Pt.>75%)  Comprehension Comprehension Mode: Auditory Comprehension: 5-Understands complex 90% of the time/Cues < 10% of the time  Expression Expression Mode: Verbal Expression: 5-Expresses basic needs/ideas: With extra time/assistive device  Social Interaction Social Interaction: 5-Interacts appropriately 90% of the time - Needs monitoring or encouragement for participation or interaction.  Problem Solving Problem Solving: 5-Solves basic problems: With no assist  Memory Memory: 5-Recognizes or recalls 90% of the time/requires cueing < 10% of the time  Medical Problem List and Plan:  1. Thrombotic left periventricular,EC infarct  2. DVT Prophylaxis/Anticoagulation: Subcutaneous Lovenox. Monitor platelet counts and any signs of bleeding  3. Pain Management: Tylenol as needed, Celebrex for 1 week total  4. Neuropsych: This patient is capable of making decisions on his own behalf.   - trazodone for sleep 5. Hypertension.  Presently on Norvasc 10 mg daily. Monitor with increased mobility.  , hydrochlorothiazide 25 mg daily. Resume  Lisinopril at 20mg  ( 40 mg daily prior to admission. ) (also on tenormin PTA).   -continue to follow and adjust as needed, DBP still elevated  -?celebrex effect 6. 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 Latest urine drug screen negative  7. Hyperlipidemia. Zocor  8. 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  - increase baclofen to 20mg  qid,     -continue ROM, left resting hand splint  9. Postop shoulder pain . 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 Continue OT/PT, finished Celebrex , also has chronic left shoulder pain  LOS (Days) 22 A FACE TO FACE EVALUATION WAS PERFORMED  Braniya Farrugia E 07/03/2013, 12:12 PM

## 2013-07-04 ENCOUNTER — Inpatient Hospital Stay (HOSPITAL_COMMUNITY): Payer: Medicare Other | Admitting: Occupational Therapy

## 2013-07-04 ENCOUNTER — Encounter (HOSPITAL_COMMUNITY): Payer: Medicare Other | Admitting: Occupational Therapy

## 2013-07-04 ENCOUNTER — Inpatient Hospital Stay (HOSPITAL_COMMUNITY): Payer: Medicare Other | Admitting: Rehabilitation

## 2013-07-04 NOTE — Plan of Care (Signed)
Problem: RH SAFETY Goal: RH STG ADHERE TO SAFETY PRECAUTIONS W/ASSISTANCE/DEVICE STG Adhere to Safety Precautions With Min Assistance/Device.  Outcome: Progressing Had to explain to pt why I couldn't leave him standing to use the urinal. He verbalized understanding.

## 2013-07-04 NOTE — Progress Notes (Signed)
Physical Therapy Session Note  Patient Details  Name: [name] MRN: [id number] Date of Birth: 1961/06/28  Today's Date: 07/04/2013 Time: 1400-1430 Time Calculation (min): 30 min  Short Term Goals: Week 3:  PT Short Term Goal 1 (Week 3): Pt will perform stand pivot transfers with RW at supervision PT Short Term Goal 2 (Week 3): Pt will perform dynamic sitting balance at mod I level PT Short Term Goal 3 (Week 3): Pt will ambulate x 150' w/ LRAD at min/guard assist for steadying PT Short Term Goal 4 (Week 3): Pt will perform dynamic standing balance activities at min assist.   Skilled Therapeutic Interventions/Progress Updates:    Focus of session was NMR for improved weight shifting and weight bearing on LLE and also w/c mobility utilizing BLEs for increased hamstring activation to carryover with ambulation.  Performed kinetron stepping in standing for increased weight shifting and weight bearing on LLE with cues for increased glute activation on L when in stance and for upright posture throughout.  Self propelled w/c from room to gym utilizing BLEs with cues for increasing use of LLE and increased knee flexion in B LEs.   Therapy Documentation Precautions:  Precautions Precautions: Fall Precaution Comments: increased extensor tone in the LLE and LUE Restrictions Weight Bearing Restrictions: No   Pain: Pt with no c/o pain during session.    Locomotion : Ambulation Ambulation/Gait Assistance: 4: Min assist Wheelchair Mobility Distance: 165'   See FIM for current functional status  Therapy/Group: Individual Therapy  [name] 07/04/2013, 4:06 PM

## 2013-07-04 NOTE — Progress Notes (Signed)
Patient ID: [name], male   DOB: 1961-01-06, 52 y.o.   MRN: [id number] Subjective/Complaints: 52 y.o. 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 Patient states to be independent prior to admission living with his wife who works day shift. Admitted 06/08/2013 with right-sided weakness. MRI of the brain showed a 1.5 cm region of acute infarction affecting the white matter adjacent to posterior body of the left lateral ventricle with extension into the external capsule region as well as extensive ischemic changes throughout the brain. Carotid Dopplers with less than 40% ICA stenosis. Echocardiogram with ejection fraction of 70% grade 1 diastolic dysfunction. Urine drug screen was negative. Patient did not receive TPA. Neurology services consulted maintained on aspirin therapy for CVA prophylaxis as well as subcutaneous Lovenox for DVT prophylaxis  No pain c/os Family training in therapy  A 12 point review of systems has been performed and if not noted above is otherwise negative.   Objective: Vital Signs: Blood pressure 141/97, pulse 65, temperature 98.2 F (36.8 C), temperature source Oral, resp. rate 20, height 5' 6.14" (1.68 m), weight 81.647 kg (180 lb), SpO2 97.00%. No results found. No results found for this or any previous visit (from the past 72 hour(s)).   HEENT: normal Cardio: tachy and no murmurs Resp: CTA B/L and unlabored GI: BS positive and non tender Extremity:  Pulses positive and No Edema Skin:   Intact Neuro: Alert/Oriented, Cranial Nerve II-XII normal, Abnormal Sensory decreased LUE/LLE LT (distal more so than prox), Abnormal Motor 3-/5 L delt,bi,tri,grip,3+ HF,KE ADF,4/5 on R side  Abnormal FMC Ataxic/ dec FMC and Tone  Hypertonic on L side 2-3/4 mainly arm and wrist and finger flexors, Tone:  normal tone on R side and Dysarthric Musc/Skel:  No R shoulder pain  with overhead activity, - impingement signs Gen NAD   Assessment/Plan: 1. 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 Physiatrist is providing close team supervision and 24 hour management of active medical problems listed below. Physiatrist and rehab team continue to assess barriers to discharge/monitor patient progress toward functional and medical goals.  FIM - Bathing Bathing Steps Patient Completed: Chest;Right Arm;Left Arm;Abdomen;Front perineal area;Buttocks;Right upper leg;Left upper leg Bathing: 4: Min-Patient completes 8-9 21f 10 parts or 75+ percent  FIM - Upper Body Dressing/Undressing Upper body dressing/undressing steps patient completed: Thread/unthread right sleeve of pullover shirt/dresss;Thread/unthread left sleeve of pullover shirt/dress;Put head through opening of pull over shirt/dress;Pull shirt over trunk Upper body dressing/undressing: 5: Supervision: Safety issues/verbal cues FIM - Lower Body Dressing/Undressing Lower body dressing/undressing steps patient completed: Thread/unthread right underwear leg;Thread/unthread left underwear leg;Pull underwear up/down;Thread/unthread right pants leg;Thread/unthread left pants leg;Don/Doff right shoe;Pull pants up/down Lower body dressing/undressing: 4: Min-Patient completed 75 plus % of tasks  FIM - Toileting Toileting steps completed by patient: Adjust clothing prior to toileting;Performs perineal hygiene;Adjust clothing after toileting Toileting Assistive Devices: Grab bar or rail for support Toileting: 4: Steadying assist  FIM - Diplomatic Services operational officer Devices: Grab bars;Elevated toilet seat Toilet Transfers: 4-To toilet/BSC: Min A (steadying Pt. > 75%);4-From toilet/BSC: Min A (steadying Pt. > 75%)  FIM - Bed/Chair  Transfer Bed/Chair Transfer Assistive Devices: Bed rails Bed/Chair Transfer: 5: Sit > Supine: Supervision (verbal cues/safety issues);4: Chair or W/C > Bed: Min A (  steadying Pt. > 75%)  FIM - Locomotion: Wheelchair Distance: 100' Locomotion: Wheelchair: 2: Travels 50 - 149 ft with supervision, cueing or coaxing FIM - Locomotion: Ambulation Locomotion: Ambulation Assistive Devices: Occupational hygienist Ambulation/Gait Assistance: 4: Min assist;3: Mod assist Locomotion: Ambulation: 2: Travels 50 - 149 ft with moderate assistance (Pt: 50 - 74%)  Comprehension Comprehension Mode: Auditory Comprehension: 5-Understands complex 90% of the time/Cues < 10% of the time  Expression Expression Mode: Verbal Expression: 5-Expresses basic needs/ideas: With extra time/assistive device  Social Interaction Social Interaction: 5-Interacts appropriately 90% of the time - Needs monitoring or encouragement for participation or interaction.  Problem Solving Problem Solving: 5-Solves basic problems: With no assist  Memory Memory: 5-Recognizes or recalls 90% of the time/requires cueing < 10% of the time  Medical Problem List and Plan:  1. Thrombotic left periventricular,EC infarct  2. DVT Prophylaxis/Anticoagulation: Subcutaneous Lovenox. Monitor platelet counts and any signs of bleeding  3. Pain Management: Tylenol as needed, Celebrex for 1 week total  4. Neuropsych: This patient is capable of making decisions on his own behalf.   - trazodone for sleep 5. Hypertension. Presently on Norvasc 10 mg daily. Monitor with increased mobility.  , hydrochlorothiazide 25 mg daily. Resume  Lisinopril at 20mg  ( 40 mg daily prior to admission. ) (also on tenormin PTA).   -continue to follow and adjust as needed, DBP still elevated  -?celebrex effect 6. 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 Latest urine drug screen negative  7. Hyperlipidemia. Zocor  8. Spasticity, chronic left sided tone  from remote cva.  - increased baclofen to 20mg  qid,  Seems to be controlling spasticity with the exception of wrist and finger flexors   -continue ROM, left resting hand splint  9. Postop shoulder pain . 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 Continue OT/PT, finished Celebrex , also has chronic left shoulder pain  LOS (Days) 23 A FACE TO FACE EVALUATION WAS PERFORMED  Krrish Freund E 07/04/2013, 7:59 AM

## 2013-07-04 NOTE — Progress Notes (Signed)
Physical Therapy Session Note  Patient Details  Name: [name] MRN: [id number] Date of Birth: [birth year]  Today's Date: 07/04/2013 Time: 0932-1030 Time Calculation (min): 58 min  Short Term Goals: Week 3:  PT Short Term Goal 1 (Week 3): Pt will perform stand pivot transfers with RW at supervision PT Short Term Goal 2 (Week 3): Pt will perform dynamic sitting balance at mod I level PT Short Term Goal 3 (Week 3): Pt will ambulate x 150' w/ LRAD at min/guard assist for steadying PT Short Term Goal 4 (Week 3): Pt will perform dynamic standing balance activities at min assist.   Skilled Therapeutic Interventions/Progress Updates:   Focus of session was gait training with RW and NMR for weight shifting and stepping w/ controlled movements and increased weight bearing on LLE when standing/sitting.  Gait training >150' with RW to gym at min assist with continued cues for improved heel to toe contact with LLE, wider step on RLE, upright posture and maintaining position inside of RW.  Pt very close to being supervision for ambulation.  Performed standing activity stepping to small step x 10 reps, however pt very untrusting of LLE and therefore can not adequately step with RLE.  Transitioned focus to equal weight bearing to increased weight bearing on LLE when standing.  Performed sit <> stand x 6 reps with small block under R foot.  Cues for hand placement on L leg when standing and slow controlled movements.    Therapy Documentation Precautions:  Precautions Precautions: Fall Precaution Comments: increased extensor tone in the LLE and LUE Restrictions Weight Bearing Restrictions: No   Pain: Pt with c/o back pain following session.  Assisted back to bed in supine position.    Locomotion : Ambulation Ambulation/Gait Assistance: 4: Min assist   See FIM for current functional status  Therapy/Group: Individual Therapy  [name] 07/04/2013, 12:23 PM

## 2013-07-04 NOTE — Progress Notes (Signed)
Occupational Therapy Session Note  Patient Details  Name: [name] MRN: [id number] Date of Birth: May 06, 1961  Today's Date: 07/04/2013 Time: 6578-4696 Time Calculation (min): 60 min  Short Term Goals: Week 4:  OT Short Term Goal 1 (Week 4): Pt will perform bathing sit to stand in the shower with AE and supervison. OT Short Term Goal 2 (Week 4): Patient will perform LB dressing to include sit and stand with min assist OT Short Term Goal 3 (Week 4): Pt will perform toilet transfers stand/squat pivot with supervision. OT Short Term Goal 4 (Week 4): Pt will perform shower transfers with close supervision  Skilled Therapeutic Interventions/Progress Updates:    Pt performed bathing and dressing this session.  Min assist for transfer to the shower using the RW.  Still with LOB posteriorly with initial standing from EOB.  Pt able to shower sit to stand with min guard assist.  Performed all dressing with min guard assist as well with the exception of donning the left shoe and AFO.  He needs min instructional cueing to position the LLE for sit to stand.  Pt is able to perform all grooming activities with supervision.    Session 2:   Took pt to the OT gym worked on scapular mobilizations, glenohumeral mobilizations and stretching to help increase AROM and decrease tone in the LUE.  Pt with increased tightness in the pectoral and rotator cuff.  Only able to tolerate approximately 0-110 degrees shoulder flexion/scaption PROM.  Focused on pt laying in supine and performing PNF movement with emphasis on extension of the elbow and horizontal abduction of the shoulder.  Pt unable to actively abduct shoulder to the mat in supine position.  He reports that he cannot feel his shoulder movements as well.  Encouraged pt to work on horizontal abduction in his bed and also external rotation by attempting to clasp his hands and place them behind his head while in bed watching TV.  Therapy Documentation Precautions:   Precautions Precautions: Fall Precaution Comments: increased extensor tone in the LLE and LUE Restrictions Weight Bearing Restrictions: No  Pain:  Pt with no pain reported during session  ADL: See FIM for current functional status  Therapy/Group: Individual Therapy  Saramarie Stinger OTR/L 07/04/2013, 12:36 PM

## 2013-07-05 ENCOUNTER — Inpatient Hospital Stay (HOSPITAL_COMMUNITY): Payer: Medicare Other | Admitting: Occupational Therapy

## 2013-07-05 ENCOUNTER — Inpatient Hospital Stay (HOSPITAL_COMMUNITY): Payer: Medicare Other | Admitting: Rehabilitation

## 2013-07-05 MED ORDER — BACLOFEN 20 MG PO TABS
20.0000 mg | ORAL_TABLET | Freq: Three times a day (TID) | ORAL | Status: DC
  Administered 2013-07-05 – 2013-07-09 (×12): 20 mg via ORAL
  Filled 2013-07-05 (×16): qty 1

## 2013-07-05 NOTE — Progress Notes (Signed)
Occupational Therapy Session Note  Patient Details  Name: [name] MRN: [id number] Date of Birth: May 14, 1961  Today's Date: 07/05/2013 Time: 0800-0900 Time Calculation (min): 60 min  Short Term Goals: Week 4:  OT Short Term Goal 1 (Week 4): Pt will perform bathing sit to stand in the shower with AE and supervison. OT Short Term Goal 2 (Week 4): Patient will perform LB dressing to include sit and stand with min assist OT Short Term Goal 3 (Week 4): Pt will perform toilet transfers stand/squat pivot with supervision. OT Short Term Goal 4 (Week 4): Pt will perform shower transfers with close supervision  Skilled Therapeutic Interventions/Progress Updates:    Pt chose to skip B/D this session so went down to the gym during session.  Placed pt in sidelying and performed scapular mobilizations in the left shoulder as well as glenohumeral mobilizations.  Had pt transition to supine position and worked on stretching pectoral and internal rotators of the shoulder.  Pt used dowel rod in supine for 2 sets of 10 repetitions of shoulder flexion and shoulder abduction to the left side only.  Pt only able to tolerate approximate 110 degrees shoulder flexion during repetitions.  Moderate tone noted in internal shoulder and pectoral.    Therapy Documentation Precautions:  Precautions Precautions: Fall Precaution Comments: increased extensor tone in the LLE and LUE Restrictions Weight Bearing Restrictions: No  Pain: Pain Assessment Pain Assessment: 0-10 Pain Score: 3  Pain Orientation: Left Pain Intervention(s): Repositioned;Massage   See FIM for current functional status  Therapy/Group: Individual Therapy  Unknown Schleyer OTR/L 07/05/2013, 9:21 AM

## 2013-07-05 NOTE — Progress Notes (Signed)
Physical Therapy Weekly Progress Note  Patient Details  Name: [name] MRN: [id number] Date of Birth: 02-06-1961  Today's Date: 07/05/2013 Time:  -     Patient has met 1 of 4 short term goals.  Pt partly met 2 additional goals, however is not consistently transferring with RW at supervision and is not consistently min/guard for ambulation with RW.  PT to continue working on ambulation with RW, transfers, as well as stairs in preparation for D/C home next week.  He also did not meet standing dynamic balance goal, as he requires min to mod assist for activity.  He does continue to progress towards all LTG's.    Patient continues to demonstrate the following deficits: dynamic balance, gait training, NMR for increased WB through R and LLE, functional movements of LUE and therefore will continue to benefit from skilled PT intervention to enhance overall performance with activity tolerance, balance, postural control, ability to compensate for deficits, functional use of  right upper extremity, right lower extremity, left upper extremity and left lower extremity and coordination.  Patient progressing toward long term goals..  Continue plan of care.  PT Short Term Goals Week 3:  PT Short Term Goal 1 (Week 3): Pt will perform stand pivot transfers with RW at supervision (not consistent) PT Short Term Goal 1 - Progress (Week 3): Partly met PT Short Term Goal 2 (Week 3): Pt will perform dynamic sitting balance at mod I level PT Short Term Goal 2 - Progress (Week 3): Met PT Short Term Goal 3 (Week 3): Pt will ambulate x 150' w/ LRAD at min/guard assist for steadying PT Short Term Goal 3 - Progress (Week 3): Partly met PT Short Term Goal 4 (Week 3): Pt will perform dynamic standing balance activities at min assist.  PT Short Term Goal 4 - Progress (Week 3): Progressing toward goal Week 4:  PT Short Term Goal 1 (Week 4): STG's=LTG's  Skilled Therapeutic Interventions/Progress Updates:   Focus of  session was gait training on varying surface types, up/down mild inclines/declines, ambulation in tight spaces, NMR for improved weight shift to left with standing and increased trunk control during scooting.  Assisted pt outside today where he ambulated over uneven concrete surfaces, up/down 12 stairs with single handrail at min/guard to min assist and max cues for safe technique and hand placement.  Pt continues to want to descent stairs backwards and PT continues to state safest way is down sideways.  Pt verbalized agreement.  Also ambulated on grassy inclined area outside with RW with min to mod assist, however with pt having to put more weight on LLE during activity.  Ambulated around gift shop with RW at min/guard to min assist, however pt had no issues bumping into objects.  Provided cues for side stepping when needed if could not fit through forwards.  Assisted back to gym and performed 5 reps of sit <> stand with small block under RLE to encourage increased weight bearing on LLE with standing and cues for increased glute activation.  Pt performed task somewhat better today and with better control.  End of session performed scooting activity forward/backward with feet unsupported and without use of hands.  Pt with increased difficulty isolating trunk movements.  Provided min tactile cues for movements.  Assisted back to room in w/c with call [place]/phone in place.    Therapy Documentation Precautions:  Precautions Precautions: Fall Precaution Comments: increased extensor tone in the LLE and LUE Restrictions Weight Bearing Restrictions: No Vital  Signs: Therapy Vitals Temp: 98.5 F (36.9 C) Temp src: Oral Pulse Rate: 79 Resp: 20 BP: 123/87 mmHg Patient Position, if appropriate: Lying Oxygen Therapy SpO2: 100 % O2 Device: None (Room air) Pain: Pt with some c/o pain in buttocks during controlled standing activity.   See FIM for current functional status  Therapy/Group: Individual  Therapy  [name] 07/05/2013, 8:23 AM

## 2013-07-05 NOTE — Progress Notes (Signed)
Social Work Patient ID: [name], male   DOB: [birth year], 52 y.o.   MRN: [id number] Met with pt and left a message for wife to discuss team conference and discharge needs.  Pt is asking about a scooter-he feels he could be more Independent with one.  Will ask Emily-PT to address with pt.  Aware of home health versus Op therapies.  Awaiting wife's return call regarding this option. Pt was asking for assistance with his care at home-informed of private duty care at home.  Informed of what insurance covers.  Will await return call from wife To address these issues and discuss her concerns.

## 2013-07-05 NOTE — Progress Notes (Signed)
Patient ID: [name], male   DOB: 1960-12-27, 52 y.o.   MRN: [id number] Subjective/Complaints: 52 y.o. 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 Patient states to be independent prior to admission living with his wife who works day shift. Admitted 06/08/2013 with right-sided weakness. MRI of the brain showed a 1.5 cm region of acute infarction affecting the white matter adjacent to posterior body of the left lateral ventricle with extension into the external capsule region as well as extensive ischemic changes throughout the brain. Carotid Dopplers with less than 40% ICA stenosis. Echocardiogram with ejection fraction of 70% grade 1 diastolic dysfunction. Urine drug screen was negative. Patient did not receive TPA. Neurology services consulted maintained on aspirin therapy for CVA prophylaxis as well as subcutaneous Lovenox for DVT prophylaxis  Feeling lethargic during therapy, wondering whether this may be secondary to increased baclofen dose  A 12 point review of systems has been performed and if not noted above is otherwise negative.   Objective: Vital Signs: Blood pressure 123/87, pulse 79, temperature 98.5 F (36.9 C), temperature source Oral, resp. rate 20, height 5' 6.14" (1.68 m), weight 81.647 kg (180 lb), SpO2 100.00%. No results found. No results found for this or any previous visit (from the past 72 hour(s)).   HEENT: normal Cardio: tachy and no murmurs Resp: CTA B/L and unlabored GI: BS positive and non tender Extremity:  Pulses positive and No Edema Skin:   Intact Neuro: Alert/Oriented, Cranial Nerve II-XII normal, Abnormal Sensory decreased LUE/LLE LT (distal more so than prox), Abnormal Motor 3-/5 L delt,bi,tri,grip,3+ HF,KE ADF,4/5 on R side  Abnormal FMC Ataxic/ dec FMC and Tone  Hypertonic on L side 2-3/4 mainly arm and wrist and finger flexors, Tone:  normal tone  on R side and Dysarthric Musc/Skel:  No R shoulder pain with overhead activity, - impingement signs Gen NAD   Assessment/Plan: 1. 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 Physiatrist is providing close team supervision and 24 hour management of active medical problems listed below. Physiatrist and rehab team continue to assess barriers to discharge/monitor patient progress toward functional and medical goals.  FIM - Bathing Bathing Steps Patient Completed: Chest;Right Arm;Left Arm;Abdomen;Front perineal area;Buttocks;Right upper leg;Left upper leg Bathing: 4: Min-Patient completes 8-9 52f 10 parts or 75+ percent  FIM - Upper Body Dressing/Undressing Upper body dressing/undressing steps patient completed: Thread/unthread right sleeve of pullover shirt/dresss;Thread/unthread left sleeve of pullover shirt/dress;Put head through opening of pull over shirt/dress;Pull shirt over trunk Upper body dressing/undressing: 5: Supervision: Safety issues/verbal cues FIM - Lower Body Dressing/Undressing Lower body dressing/undressing steps patient completed: Thread/unthread right underwear leg;Thread/unthread left underwear leg;Pull underwear up/down;Thread/unthread right pants leg;Thread/unthread left pants leg;Don/Doff right shoe;Pull pants up/down Lower body dressing/undressing: 4: Min-Patient completed 75 plus % of tasks  FIM - Toileting Toileting steps completed by patient: Adjust clothing prior to toileting;Performs perineal hygiene;Adjust clothing after toileting Toileting Assistive Devices: Grab bar or rail for support Toileting: 4: Steadying assist  FIM - Diplomatic Services operational officer Devices: Grab bars;Elevated toilet seat Toilet Transfers: 4-To toilet/BSC: Min A (steadying Pt. > 75%);4-From  toilet/BSC: Min A (steadying Pt. > 75%)  FIM - Bed/Chair Transfer Bed/Chair Transfer Assistive Devices: Bed rails Bed/Chair Transfer: 5: Sit > Supine: Supervision (verbal cues/safety issues);4:  Chair or W/C > Bed: Min A (steadying Pt. > 75%)  FIM - Locomotion: Wheelchair Distance: 165' Locomotion: Wheelchair: 5: Travels 150 ft or more: maneuvers on rugs and over door sills with supervision, cueing or coaxing FIM - Locomotion: Ambulation Locomotion: Ambulation Assistive Devices: Designer, industrial/product Ambulation/Gait Assistance: 4: Min assist Locomotion: Ambulation: 4: Travels 150 ft or more with minimal assistance (Pt.>75%)  Comprehension Comprehension Mode: Auditory Comprehension: 5-Understands complex 90% of the time/Cues < 10% of the time  Expression Expression Mode: Verbal Expression: 5-Expresses basic needs/ideas: With no assist  Social Interaction Social Interaction: 5-Interacts appropriately 90% of the time - Needs monitoring or encouragement for participation or interaction.  Problem Solving Problem Solving: 5-Solves basic problems: With no assist  Memory Memory: 5-Recognizes or recalls 90% of the time/requires cueing < 10% of the time  Medical Problem List and Plan:  1. Thrombotic left periventricular,EC infarct  2. DVT Prophylaxis/Anticoagulation: Subcutaneous Lovenox. Monitor platelet counts and any signs of bleeding  3. Pain Management: Tylenol as needed, Celebrex for 1 week total  4. Neuropsych: This patient is capable of making decisions on his own behalf.   - trazodone for sleep 5. Hypertension. Presently on Norvasc 10 mg daily. Monitor with increased mobility.  , hydrochlorothiazide 25 mg daily. Resume  Lisinopril at 20mg  ( 40 mg daily prior to admission. ) (also on tenormin PTA).   -continue to follow and adjust as needed, DBP still elevated  -?celebrex effect 6. 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 Latest urine drug screen negative   7. Hyperlipidemia. Zocor  8. 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  - increased baclofen to 20mg  qid,  Seems to be controlling spasticity with the exception of wrist and finger flexors, will reduce to TID   -continue ROM, left resting hand splint  9. Postop shoulder pain . 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 Continue OT/PT, finished Celebrex , also has chronic left shoulder pain  LOS (Days) 24 A FACE TO FACE EVALUATION WAS PERFORMED  [name] E 07/05/2013, 7:54 AM

## 2013-07-05 NOTE — Progress Notes (Addendum)
Occupational Therapy Session Note  Patient Details  Name: [name] MRN: [id number] Date of Birth: May 19, 1961  Today's Date: 07/05/2013 Time: 4098-1191 Time Calculation (min): 50 min   Skilled Therapeutic Interventions/Progress Updates:    Took pt to the OT gym and worked on Medical sales representative.  Applied NMES to the posterior delt/rhomboid region to help activation in hopes of decreasing his tone in the front of his shoulder.  Pre-set program used on level 35 intensity with pulse width 50 PPS and during 30 mins.  Simulation alternated on and off for 10 second intervals.  While stimulation was active had pt work on external rotation and abduction of the LUE sitting on the mat.  Pt needing mod facilitation to avoid trunk compensations and twisting during the activity.  Pt with no adverse reactions to stimuli.  Therapy Documentation Precautions:  Precautions Precautions: Fall Precaution Comments: increased extensor tone in the LLE and LUE Restrictions Weight Bearing Restrictions: No  Pain: Pain Assessment Pain Assessment: 0-10 Pain Score: 3  Pain Orientation: Left Pain Intervention(s): Repositioned;Massage ADL: See FIM for current functional status  Therapy/Group: Individual Therapy  Chick Cousins OTR/L 07/05/2013, 12:21 PM

## 2013-07-06 ENCOUNTER — Inpatient Hospital Stay (HOSPITAL_COMMUNITY): Payer: Medicare Other | Admitting: *Deleted

## 2013-07-06 NOTE — Progress Notes (Signed)
Patient ID: [name], male   DOB: [birth year], 52 y.o.   MRN: [id number] Subjective/Complaints: 52 y.o. 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 Patient states to be independent prior to admission living with his wife who works day shift. Admitted 06/08/2013 with right-sided weakness. MRI of the brain showed a 1.5 cm region of acute infarction affecting the white matter adjacent to posterior body of the left lateral ventricle with extension into the external capsule region as well as extensive ischemic changes throughout the brain. Carotid Dopplers with less than 40% ICA stenosis. Echocardiogram with ejection fraction of 70% grade 1 diastolic dysfunction. Urine drug screen was negative. Patient did not receive TPA. Neurology services consulted maintained on aspirin therapy for CVA prophylaxis as well as subcutaneous Lovenox for DVT prophylaxis  Subjective: Patient has no specific complaints. He does state that when he takes baclofen he feels a bit "woozy".  Review of systems patient denies chest pain, shortness of breath, headache.   Objective: Vital Signs: Blood pressure 132/88, pulse 99, temperature 97.9 F (36.6 C), temperature source Oral, resp. rate 18, height 5' 6.14" (1.68 m), weight 180 lb (81.647 kg), SpO2 97.00%.  Exam: No acute distress. Speech is somewhat slurred. Chest is clear to auscultation. Cardiac exam S1-S2 are regular. Abdominal exam active bowel sounds, soft. He is overweight. Extremities no edema.  Assessment/Plan: 1. 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 Thrombotic left periventricular,EC infarct  2. DVT Prophylaxis/Anticoagulation: Subcutaneous Lovenox. Monitor platelet counts and any signs of bleeding  3. Pain Management: Tylenol as  needed, Celebrex for 1 week total  4. Neuropsych: This patient is capable of making decisions on his own behalf.   - trazodone for sleep 5. Hypertension. Blood pressure currently controlled. 6. 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 Latest urine drug screen negative  7. Hyperlipidemia. Zocor  8. 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  - increased baclofen to 20mg  qid,  Seems to be controlling spasticity with the exception of wrist and finger flexors, will reduce to TID   -continue ROM, left resting hand splint Discussed baclofen dosing with the patient.  9. Postop shoulder pain . 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 Continue OT/PT, finished Celebrex , also has chronic left shoulder pain  LOS (Days) 25 A FACE TO FACE EVALUATION WAS PERFORMED  [name] 07/06/2013, 9:06 AM

## 2013-07-06 NOTE — Progress Notes (Signed)
Physical Therapy Note  Patient Details  Name: [name] MRN: [id number] Date of Birth: 1961/10/12 Today's Date: 07/06/2013  1400-1455 (55 minutes) group Pain: no reported c/o pain Pt participated in PT group session focused on gait training/safety/endurance; Nustep for bilateral LE strengthening/control X 20 minutes Level 4; gait 80 feet X 3 RW min to close SBA.    [name],JIM 07/06/2013, 3:12 PM

## 2013-07-07 ENCOUNTER — Inpatient Hospital Stay (HOSPITAL_COMMUNITY): Payer: Medicare Other | Admitting: *Deleted

## 2013-07-07 NOTE — Progress Notes (Signed)
Physical Therapy Session Note  Patient Details  Name: [name] MRN: [id number] Date of Birth: [birth year]  Today's Date: 07/07/2013 Time: 0800-0857 Time Calculation (min): 57 min  Short Term Goals: Week 4:  PT Short Term Goal 1 (Week 4): STG's=LTG's  Skilled Therapeutic Interventions/Progress Updates:    Patient received from nurse tech sitting on toilet. Patient perform sit>stand from toilet with use of grab bar and is able to perform hygiene after BM and manage lower body dressing with min guard for standing balance. Patient requires total A for donning of TEDs and max A to for donning of L shoe with AFO. Patient performed wheelchair mobility x75' with B LE to facilitate strengthening and improved motor control, requires encouragement to use L LE. Patient performed gait training x90' with RW and min guard, verbal cues to maintain BOS within RW and for increased step length on the L LE. Patient performed obstacle course x3 trials requiring him to weave in/out of 4 cones and step over 2 bolsters. During first trial, patient requires verbal cues for sequencing and technique for stepping over bolsters and requires modA for negotiation of one bolster. During second and third trials, patient requires minA progressing to minguard for stepping over obstacles. Emphasis on RW safety and management with small, confined spaces.  Patient practiced picking up 4 cones from floor x2 trials. Patient requires minA for activity and attempts to use B UE to pick up cones, however is unable to successfully complete with L UE secondary to decreased grip strength. Patient demonstrates good safety awareness as he takes steps forward and widens BOS prior to attempting to pick cones up. Patient requires increased time to pick up each cone and requires rest break between trials due to fatigue. Patient performs wheelchair mobility x50' towards room with B LE, again requiring cues and encouragement to use L LE. Patient  states that "using L LE inhibits his progress". Patient returned to room and left seated in wheelchair with all needs within reach.  Therapy Documentation Precautions:  Precautions Precautions: Fall Precaution Comments: increased extensor tone in the LLE and LUE Restrictions Weight Bearing Restrictions: No Pain: Pain Assessment Pain Assessment: No/denies pain Pain Score: 0-No pain Locomotion : Ambulation Ambulation/Gait Assistance: 4: Min guard Wheelchair Mobility Distance: 75   See FIM for current functional status  Therapy/Group: Individual Therapy  Chipper Herb. Rylynn Schoneman, PT, DPT 07/07/2013, 8:58 AM

## 2013-07-07 NOTE — Progress Notes (Signed)
Patient ID: [name], male   DOB: [birth year], 52 y.o.   MRN: [id number] Subjective/Complaints: 52 y.o. 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 Patient states to be independent prior to admission living with his wife who works day shift. Admitted 06/08/2013 with right-sided weakness. MRI of the brain showed a 1.5 cm region of acute infarction affecting the white matter adjacent to posterior body of the left lateral ventricle with extension into the external capsule region as well as extensive ischemic changes throughout the brain. Carotid Dopplers with less than 40% ICA stenosis. Echocardiogram with ejection fraction of 70% grade 1 diastolic dysfunction. Urine drug screen was negative. Patient did not receive TPA. Neurology services consulted maintained on aspirin therapy for CVA prophylaxis as well as subcutaneous Lovenox for DVT prophylaxis  Subjective: No complaints today. He still has some spasms but thinks that those are improving. He likes the lower dose of baclofen because he feels less "woozy".  Review of systems: Patient denies chest pain, shortness of breath or headache.   Objective: Vital Signs: Blood pressure 132/93, pulse 81, temperature 98.1 F (36.7 C), temperature source Oral, resp. rate 18, height 5' 6.14" (1.68 m), weight 180 lb (81.647 kg), SpO2 97.00%.  Exam: No acute distress. Speech is somewhat slurred. Chest is clear to auscultation. Cardiac exam S1-S2 are regular. Abdominal exam active bowel sounds, soft. He is overweight. Extremities no edema.  Assessment/Plan: 1. 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 Thrombotic left periventricular,EC infarct  2. DVT Prophylaxis/Anticoagulation: Subcutaneous Lovenox. Monitor platelet counts and any  signs of bleeding  3. Pain Management: Tylenol as needed, Celebrex for 1 week total  4. Neuropsych: This patient is capable of making decisions on his own behalf.   - trazodone for sleep 5. Hypertension. Blood pressure range 132-131/93-98. There is likely a mis-type results of 317/98 in the patient's record. 6. 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 Latest urine drug screen negative  7. Hyperlipidemia. Zocor  8. 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  - increased baclofen to 20mg  qid,  Seems to be controlling spasticity with the exception of wrist and finger flexors, will reduce to TID   -continue ROM, left resting hand splint Discussed baclofen dosing with the patient.  9. Postop shoulder pain . Seems to be improving.  LOS (Days) 26 A FACE TO FACE EVALUATION WAS PERFORMED  Jannah Guardiola HENRY 07/07/2013, 8:39 AM

## 2013-07-08 ENCOUNTER — Inpatient Hospital Stay (HOSPITAL_COMMUNITY): Payer: Medicare Other | Admitting: Occupational Therapy

## 2013-07-08 ENCOUNTER — Inpatient Hospital Stay (HOSPITAL_COMMUNITY): Payer: Medicare Other | Admitting: Rehabilitation

## 2013-07-08 DIAGNOSIS — I6330 Cerebral infarction due to thrombosis of unspecified cerebral artery: Secondary | ICD-10-CM

## 2013-07-08 DIAGNOSIS — G8110 Spastic hemiplegia affecting unspecified side: Secondary | ICD-10-CM

## 2013-07-08 NOTE — Progress Notes (Signed)
Patient ID: [name], male   DOB: 10-May-1961, 52 y.o.   MRN: [id number] Subjective/Complaints: 52 y.o. 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 Patient states to be independent prior to admission living with his wife who works day shift. Admitted 06/08/2013 with right-sided weakness. MRI of the brain showed a 1.5 cm region of acute infarction affecting the white matter adjacent to posterior body of the left lateral ventricle with extension into the external capsule region as well as extensive ischemic changes throughout the brain. Carotid Dopplers with less than 40% ICA stenosis. Echocardiogram with ejection fraction of 70% grade 1 diastolic dysfunction. Urine drug screen was negative. Patient did not receive TPA. Neurology services consulted maintained on aspirin therapy for CVA prophylaxis as well as subcutaneous Lovenox for DVT prophylaxis  No c/o lethargy, no other new issues, looking fwd to D/C in am  A 12 point review of systems has been performed and if not noted above is otherwise negative.   Objective: Vital Signs: Blood pressure 123/84, pulse 77, temperature 98.4 F (36.9 C), temperature source Oral, resp. rate 19, height 5' 6.14" (1.68 m), weight 81.647 kg (180 lb), SpO2 98.00%. No results found. No results found for this or any previous visit (from the past 72 hour(s)).   HEENT: normal Cardio: tachy and no murmurs Resp: CTA B/L and unlabored GI: BS positive and non tender Extremity:  Pulses positive and No Edema Skin:   Intact Neuro: Alert/Oriented, Cranial Nerve II-XII normal, Abnormal Sensory decreased LUE/LLE LT (distal more so than prox), Abnormal Motor 3-/5 L delt,bi,tri,grip,3+ HF,KE ADF,4/5 on R side  Abnormal FMC Ataxic/ dec FMC and Tone  Hypertonic on L side 2-3/4 mainly arm and wrist and finger flexors, Tone:  normal tone on R side and Dysarthric Musc/Skel:   No R shoulder pain with overhead activity, - impingement signs Gen NAD   Assessment/Plan: 1. 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 Physiatrist is providing close team supervision and 24 hour management of active medical problems listed below. Physiatrist and rehab team continue to assess barriers to discharge/monitor patient progress toward functional and medical goals.  FIM - Bathing Bathing Steps Patient Completed: Chest;Right Arm;Left Arm;Abdomen;Front perineal area;Buttocks;Right upper leg;Left upper leg Bathing: 4: Min-Patient completes 8-9 66f 10 parts or 75+ percent  FIM - Upper Body Dressing/Undressing Upper body dressing/undressing steps patient completed: Thread/unthread right sleeve of pullover shirt/dresss;Thread/unthread left sleeve of pullover shirt/dress;Put head through opening of pull over shirt/dress;Pull shirt over trunk Upper body dressing/undressing: 5: Supervision: Safety issues/verbal cues FIM - Lower Body Dressing/Undressing Lower body dressing/undressing steps patient completed: Thread/unthread right underwear leg;Thread/unthread left underwear leg;Pull underwear up/down;Thread/unthread right pants leg;Thread/unthread left pants leg;Don/Doff right shoe;Pull pants up/down Lower body dressing/undressing: 4: Min-Patient completed 75 plus % of tasks  FIM - Toileting Toileting steps completed by patient: Adjust clothing prior to toileting;Performs perineal hygiene;Adjust clothing after toileting Toileting Assistive Devices: Grab bar or rail for support Toileting: 4: Steadying assist  FIM - Diplomatic Services operational officer Devices: Grab bars;Elevated toilet seat Toilet Transfers: 4-To toilet/BSC: Min A (steadying Pt. > 75%);4-From toilet/BSC: Min A (steadying Pt. >  75%)  FIM - Bed/Chair Transfer Bed/Chair Transfer Assistive Devices: Arm rests;Walker Bed/Chair Transfer: 4: Chair or W/C > Bed: Min A (steadying  Pt. > 75%);4: Bed > Chair or W/C: Min A (steadying Pt. > 75%)  FIM - Locomotion: Wheelchair Distance: 75 Locomotion: Wheelchair: 2: Travels 50 - 149 ft with supervision, cueing or coaxing FIM - Locomotion: Ambulation Locomotion: Ambulation Assistive Devices: Designer, industrial/product Ambulation/Gait Assistance: 4: Min guard Locomotion: Ambulation: 2: Travels 50 - 149 ft with minimal assistance (Pt.>75%)  Comprehension Comprehension Mode: Auditory Comprehension: 5-Understands complex 90% of the time/Cues < 10% of the time  Expression Expression Mode: Verbal Expression: 6-Expresses complex ideas: With extra time/assistive device  Social Interaction Social Interaction: 5-Interacts appropriately 90% of the time - Needs monitoring or encouragement for participation or interaction.  Problem Solving Problem Solving: 5-Solves basic problems: With no assist  Memory Memory: 5-Recognizes or recalls 90% of the time/requires cueing < 10% of the time  Medical Problem List and Plan:  1. Thrombotic left periventricular,EC infarct  2. DVT Prophylaxis/Anticoagulation: Subcutaneous Lovenox. Monitor platelet counts and any signs of bleeding  3. Pain Management: Tylenol as needed,  4. Neuropsych: This patient is capable of making decisions on his own behalf.   - trazodone for sleep 5. Hypertension. Presently on Norvasc 10 mg daily. Monitor with increased mobility.  , hydrochlorothiazide 25 mg daily. Resume  Lisinopril at 20mg  ( 40 mg daily prior to admission. ) (also on tenormin PTA).   -continue to follow and adjust as needed, DBP still elevated  -?celebrex effect 6. 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 Latest urine drug screen negative  7. Hyperlipidemia. Zocor  8. 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  -  increased baclofen to 20mg  tid,  Seems to be controlling spasticity with the exception of wrist and finger flexors,   -continue ROM, left resting hand splint  9. Postop shoulder pain . 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 Continue OT/PT, finished Celebrex , also has chronic left shoulder pain  LOS (Days) 27 A FACE TO FACE EVALUATION WAS PERFORMED  KIRSTEINS,ANDREW E 07/08/2013, 8:12 AM

## 2013-07-08 NOTE — Progress Notes (Signed)
Physical Therapy Discharge Summary  Patient Details  Name: [name] MRN: [id number] Date of Birth: 1960/12/05  Today's Date: 07/08/2013 Time: 1000-1100 and 830-900 and  Time Calculation (min): 60 min and 30 mins  Patient has met 9 of 9 long term goals due to improved activity tolerance, improved balance, improved postural control, increased strength, decreased pain, ability to compensate for deficits, functional use of  right upper extremity, right lower extremity, left upper extremity and left lower extremity and improved coordination.  Patient to discharge at an ambulatory level Supervision.   Patient's care partner is independent to provide the necessary physical assistance at discharge.  Reasons goals not met: n/a  Recommendation:  Patient will benefit from ongoing skilled PT services in home health setting to continue to advance safe functional mobility, address ongoing impairments in balance, functional use of LLE/UE, gait, safety ataxia, and minimize fall risk.  Equipment: RW, manual w/c  Reasons for discharge: treatment goals met and discharge from hospital  Patient/family agrees with progress made and goals achieved: Yes  PT intervention and treatment: 1st AM session:  Pt received supine in bed and ready to begin last day of therapy.  Performed supine to sit with HOB flat at supervision level and transferred to w/c supervision level.  Able to don shoes and L AFO independently, however did assist with donning of TEDs.  States he needed to use restroom, therefore had pt utilize w/c in restroom to perform toileting at supervision to mod I level.  Performed car transfer from w/c with RW at supervision level and also 4 steps with R handrail at supervision with single cue for descending sideways for safety.  Assisted back to room in w/c with all needs in reach.   2nd AM session:  Focus of session was performing bed mobility, furniture transfers, floor transfers and ambulation with  RW.  Pt able to perform all parts of bed mobility and transfers to varying surfaces at supervision level.  Performed floor transfer at min assist (more so for getting into floor than getting up to bed) and cues for technique and safety.  Ambulated over 150' with RW over controlled environment and also in crowded day room (carpeted surface) at supervision level with min cues for crossing over threshold.  Also performed w/c mobility over 150' at Mod I level in controlled area, over carpet and thresholds.  Pt left in room in w/c with all needs in place.   PM session: Performed obstacle course over and around many different types/sizes of obstacles with min assist and max cues for safe technique.  Pt very insistent on performing task as he would like to, despite safety concerns.  Performed seated Nu step x 10 mins at level 5 with BLEs only.  Performed standing balance on foam pad x 30 secs and progressed to reaching for targets in diagonal pattern for increased trunk rotation.  Trunk rotation to R is much improved, however continues to have increased difficulty with trunk rotation to L side as tone tends to kick in and limit pts mobility.  He was able to perform dynamic standing at supervision level (close supervision) with cues for achieving upright balance in between each reaching activity.  Assisted back to room in w/c with all needs in reach.      PT Discharge Precautions/Restrictions Precautions Precautions: Fall Precaution Comments: increased extensor tone in the LLE and LUE Restrictions Weight Bearing Restrictions: No   Pain Pain Assessment Pain Assessment: No/denies pain Pain Score: 0-No pain  Cognition Overall Cognitive Status: Within Functional Limits for tasks assessed Arousal/Alertness: Awake/alert Orientation Level: Oriented X4 Sensation Sensation Light Touch: Appears Intact Proprioception: Appears Intact Coordination Gross Motor Movements are Fluid and Coordinated: No Heel Shin  Test: Pt with slower and non smooth movements during both heel to shin tests, however is slight improved with R over L Motor  Motor Motor: Hemiplegia;Abnormal postural alignment and control;Ataxia;Abnormal tone Motor - Skilled Clinical Observations: Pt with increased extensor posturing with attempted dynamic movement and also exhibits increased ataxia with function use of the RUE.  Increased tone noted in the LUE flexors and extensors of the elbow and flexers of the hand. Motor - Discharge Observations: Pt continues to have increased tone in LUE/LE and ataxic movements, however pt able to compensate safely during functional tasks.      Locomotion  Ambulation Ambulation/Gait Assistance: 5: Supervision  Trunk/Postural Assessment  Cervical Assessment Cervical Assessment: Within Functional Limits Thoracic Assessment Thoracic Assessment: Within Functional Limits Lumbar Assessment Lumbar Assessment: Within Functional Limits Postural Control Trunk Control: Pt continues to have difficulty with controlling trunk during dyanmic balance activities, however is now at supervision level with lower level dynamic standing balance activities.   Balance Balance Balance Assessed: Yes Static Standing Balance Static Standing - Balance Support: Left upper extremity supported;During functional activity Static Standing - Level of Assistance: 5: Stand by assistance Static Standing - Comment/# of Minutes: Pt able to stand 3-4 mins in restroom while toileting and could manage pants on his own.  Extremity Assessment      RLE Assessment RLE Assessment: Exceptions to [facility] RLE Strength RLE Overall Strength: Deficits RLE Overall Strength Comments: hip flex 4/5 (compensates with trunk extension), knee flex/ext 5/5, ankle DF4/5, ankle PF 5/5 LLE Assessment LLE Assessment: Exceptions to [facility] LLE Strength LLE Overall Strength: Deficits LLE Overall Strength Comments: hip flex 4/5 (compensates with trunk extension),  knee flex/ext 5/5, ankle DF 3+/5, ankle PF 5/5  See FIM for current functional status  [name] 07/08/2013, 12:26 PM

## 2013-07-08 NOTE — Discharge Summary (Signed)
  Discharge summary job # [id number]

## 2013-07-08 NOTE — Discharge Summary (Signed)
[name], [name]                ACCOUNT NO.:  [id number]  MEDICAL RECORD NO.:  [id number]  LOCATION:  [id number]                        FACILITY:  [facility]  PHYSICIAN:  Erick Colace, M.D.DATE OF BIRTH:  [birth year]  DATE OF ADMISSION:  06/11/2013 DATE OF DISCHARGE:  07/09/2013                              DISCHARGE SUMMARY   DISCHARGE DIAGNOSES: 1. Thrombotic left periventricular external capsule infarction. 2. Subcutaneous Lovenox for deep vein thrombosis prophylaxis. 3. Hypertension. 4. History of ICH x2 secondary to cocaine with residual left-sided     weakness. 5. Hyperlipidemia.  HISTORY OF PRESENT ILLNESS:  This is a 52 year old right-handed African American male with history of hypertension, ICH x2 with residual left- sided weakness.  The patient independent prior to admission, living with his wife.  Admitted June 08, 2013, with right-sided weakness.  MRI of the brain showed a 1.5 cm region of acute infarction affecting the white matter adjacent to posterior body of the left lateral ventricle with extension to the external capsule region as well as extensive ischemic changes throughout the brain.  Carotid Dopplers with less than 40% ICA stenosis.  Echocardiogram with ejection fraction of 70% grade 1 diastolic dysfunction.  Urine drug screen negative.  The patient did not receive tPA.  Neurology Service was consulted, maintained on aspirin therapy for CVA prophylaxis as well as subcutaneous Lovenox for DVT prophylaxis.  The patient was tolerating a regular consistency diet. Physical and occupational therapy ongoing.  The patient was admitted for comprehensive rehab program.  PAST MEDICAL HISTORY:  See discharge diagnoses.  SOCIAL HISTORY:  Lives with spouse.  FUNCTIONAL HISTORY PRIOR TO ADMISSION:  Independent on disability.  FUNCTIONAL STATUS UPON ADMISSION TO REHAB SERVICES:  +2 total assist for stand pivot transfers ambulating 40 feet with a rolling walker +2  total assist.  PHYSICAL EXAMINATION:  VITAL SIGNS:  Blood pressure 131/91, pulse 92, temperature 98.4, respirations 20. GENERAL:  This was an alert male, somewhat anxious.  He could provide his name, place as well as date of birth and follows simple commands. EYES:  Pupils round and reactive to light. LUNGS:  Clear to auscultation. CARDIAC:  Regular rate and rhythm. ABDOMEN:  Soft, nontender.  Good bowel sounds.  REHABILITATION HOSPITAL COURSE:  The patient was admitted to inpatient rehab services with therapies initiated on a 3-hour daily basis consisting of physical therapy, occupational therapy, and rehabilitation nursing.  The following issues were addressed during the patient's rehabilitation stay.  Pertaining to [name] thrombotic left periventricular infarction remain stable, maintained on aspirin therapy. The patient would follow up with Neurology Services.  Subcutaneous Lovenox ongoing for DVT prophylaxis throughout rehab course.  Blood pressures monitored on Norvasc 10 mg daily, hydrochlorothiazide 25 mg daily, and lisinopril 20 mg daily.  The patient with no orthostatic changes and would follow up with primary MD.  The patient noted with history of ICH x2 felt to be secondary to cocaine with residual left- sided weakness, as well as spasticity.  He was placed on a trial of baclofen 20 mg 3 times daily and monitored.  The patient received weekly collaborative interdisciplinary team conferences to discuss estimated length of stay, family teaching, and any barriers to  discharge.  The patient performed wheelchair mobility with bilateral lower extremities to facilitate strengthening and improve motor control, ambulating 90 feet with a rolling walker, and minimal assist with some verbal cues. The patient practiced picking up four cones from the floor x2 requiring minimal assist for activity, attempts to use bilateral upper extremity to pick up cones.  The patient required  minimal assist for basic activities of daily living tasks to include some verbal cues for safety and increased time.  He was continent of bowel and bladder.  Full family teaching was completed and plan will be discharged to home.  DISCHARGE MEDICATIONS: 1. Norvasc 10 mg p.o. daily. 2. Aspirin 325 mg p.o. daily. 3. Lipitor 10 mg p.o. daily. 4. Baclofen 20 mg p.o. t.i.d. 5. Flonase 2 sprays each nostril twice daily. 6. Hydrochlorothiazide 25 mg daily. 7. Lisinopril 20 mg daily. 8. Multivitamin 1 tablet daily. 9. Ultram 50-100 mg twice daily as needed for moderate pain.  DIET:  Regular.  SPECIAL INSTRUCTIONS:  The patient would follow up Dr. Claudette Laws at the outpatient rehab center as directed, Dr. Clyde Lundborg, medical management, appointment to be made, Dr. Delia Heady, Neurology Services 1 month call for appointment.     Mariam Dollar, P.A.   ______________________________ Erick Colace, M.D.    DA/MEDQ  D:  07/08/2013  T:  07/08/2013  Job:  [id number]  cc:   Lorretta Harp, MD Pramod P. Pearlean Brownie, MD

## 2013-07-08 NOTE — Progress Notes (Signed)
Occupational Therapy Session Note  Patient Details  Name: [name] MRN: [id number] Date of Birth: June 11, 1961  Today's Date: 07/08/2013 Time: 1000-1100 Time Calculation (min): 60 min  Short Term Goals: Week 1:  OT Short Term Goal 1 (Week 1): Pt will maintain dynamic sitting balance EOC or bed during bathing with min assist. OT Short Term Goal 1 - Progress (Week 1): Met OT Short Term Goal 2 (Week 1): Pt will perform sit to stand with mod assist during LB selfcare. OT Short Term Goal 2 - Progress (Week 1): Met OT Short Term Goal 3 (Week 1): Pt will perform toilet transfer with mod assist stand pivot. OT Short Term Goal 3 - Progress (Week 1): Met OT Short Term Goal 4 (Week 1): Pt will perform LB bathing sit to stand with mod assist and AE PRN. OT Short Term Goal 4 - Progress (Week 1): Met  Skilled Therapeutic Interventions/Progress Updates:    Pt seen for ADL retraining to address all self care, transfers with the w/c and transfers with the walker, bathroom DME transfers, kitchen mobility techniques with kitchen cart and walker bag, kitchen safety.  Pt is now mod I in the room to complete toilet transfers from his w/c.  He can also use his walker in the room with supervision.  At home, advised pt to only cook with supervision unless he is making a cold cut sandwich from his w/c.  Discussed home safety recommendations. Pt will be going home tomorrow.  Therapy Documentation Precautions:  Precautions Precautions: Fall Precaution Comments: increased extensor tone in the LLE and LUE Restrictions Weight Bearing Restrictions: No   Pain: Pain Assessment Pain Assessment: No/denies pain Pain Score: 0-No pain  ADL:  See FIM for current functional status  Therapy/Group: Individual Therapy  Estephanie Hubbs 07/08/2013, 12:19 PM

## 2013-07-08 NOTE — Progress Notes (Signed)
Occupational Therapy Discharge Summary  Patient Details  Name: [name] MRN: [id number] Date of Birth: Sep 30, 1961  Today's Date: 07/08/2013  Patient has met 12 of 12 long term goals due to improved balance, postural control, ability to compensate for deficits, functional use of  RIGHT upper extremity, improved awareness and improved coordination.  Patient to discharge at overall Supervision level.  Patient's care partner is independent to provide the necessary physical assistance at discharge.    Reasons goals not met: n/a  Recommendation:  Patient will benefit from ongoing skilled OT services in home health setting to continue to advance functional skills in the area of BADL and iADL.  Equipment: tub bench  Reasons for discharge: treatment goals met  Patient/family agrees with progress made and goals achieved: Yes  OT Discharge Precautions/Restrictions  Precautions Precautions: Fall Precaution Comments: increased extensor tone in the LLE and LUE Restrictions Weight Bearing Restrictions: No  ADL  supervision - mod I - refer to FIM  Vision/Perception  Vision - History Baseline Vision: Wears glasses only for reading Patient Visual Report: No change from baseline Vision - Assessment Eye Alignment: Within Functional Limits Perception Perception: Within Functional Limits Praxis Praxis: Intact  Cognition Overall Cognitive Status: Within Functional Limits for tasks assessed Arousal/Alertness: Awake/alert Orientation Level: Oriented X4 Sensation Sensation Light Touch: Appears Intact Proprioception: Appears Intact Coordination Gross Motor Movements are Fluid and Coordinated: No Heel Shin Test: Pt with slower and non smooth movements during both heel to shin tests, however is slight improved with R over L Motor  Motor Motor: Hemiplegia;Abnormal postural alignment and control;Ataxia;Abnormal tone Motor - Skilled Clinical Observations: Pt with increased extensor  posturing with attempted dynamic movement and also exhibits increased ataxia with function use of the RUE.  Increased tone noted in the LUE flexors and extensors of the elbow and flexers of the hand. Motor - Discharge Observations: Pt continues to have increased tone in LUE/LE and ataxic movements, however pt able to compensate safely during functional tasks.   Mobility    refer to FIM Trunk/Postural Assessment  Cervical Assessment Cervical Assessment: Within Functional Limits Thoracic Assessment Thoracic Assessment: Within Functional Limits Lumbar Assessment Lumbar Assessment: Within Functional Limits Postural Control Trunk Control: Pt continues to have difficulty with controlling trunk during dyanmic balance activities, however is now at supervision level with lower level dynamic standing balance activities.   Balance Balance Balance Assessed: Yes Static Standing Balance Static Standing - Balance Support: Left upper extremity supported;During functional activity Static Standing - Level of Assistance: 5: Stand by assistance Static Standing - Comment/# of Minutes: Pt able to stand 3-4 mins in restroom while toileting and could manage pants on his own.  Extremity/Trunk Assessment RUE Strength RUE Overall Strength Comments: Pt with full AROM in all joints, demonstrates ataxia with FM and reaching tasks.  Overall strength at least 4/5 throughout. LUE Strength LUE Overall Strength Comments: Pt with history of left hemiparesis from previous CVA.  Pt with increased elbow extensor and flexor tone as well as digit flexion but he is able to use as an active assist for bathing and dressing but with limited coordination.  Brunnstrum stage V movement in the arm and hand.  See FIM for current functional status  SAGUIER,JULIA 07/08/2013, 12:30 PM

## 2013-07-08 NOTE — Progress Notes (Signed)
Social Work Patient ID: [name], male   DOB: [birth year], 52 y.o.   MRN: [id number] Met with pt to discuss discharge tomorrow.  He and wife have decided home health better option right now over OP. Concerned about co-pays and transportation.  Have switched to home health.  Agrees with wheelchair, rolling walker and tub bench. Pt does need the wheelchair to be able to self propel and uses for self care.  He is unable to self propel a lightweight wheelchair. He is still  considering a scooter and will pursue this on his own.  Wife has not returned this worker's call so will assume concerns addressed and Will see on discharge tomorrow, to wrap up any loses ends.

## 2013-07-09 DIAGNOSIS — G8110 Spastic hemiplegia affecting unspecified side: Secondary | ICD-10-CM

## 2013-07-09 DIAGNOSIS — I6330 Cerebral infarction due to thrombosis of unspecified cerebral artery: Secondary | ICD-10-CM

## 2013-07-09 MED ORDER — BACLOFEN 20 MG PO TABS: 20.0000 mg | ORAL_TABLET | Freq: Three times a day (TID) | ORAL | Status: DC

## 2013-07-09 MED ORDER — LISINOPRIL 20 MG PO TABS: 20.0000 mg | ORAL_TABLET | Freq: Every day | ORAL | Status: DC

## 2013-07-09 MED ORDER — ASPIRIN 325 MG PO TBEC: 325.0000 mg | DELAYED_RELEASE_TABLET | Freq: Every day | ORAL | Status: DC

## 2013-07-09 MED ORDER — TRAMADOL HCL 50 MG PO TABS: 50.0000 mg | ORAL_TABLET | Freq: Two times a day (BID) | ORAL | Status: DC | PRN

## 2013-07-09 MED ORDER — HYDROCHLOROTHIAZIDE 25 MG PO TABS: 25.0000 mg | ORAL_TABLET | Freq: Every morning | ORAL | Status: DC

## 2013-07-09 MED ORDER — ATORVASTATIN CALCIUM 10 MG PO TABS: 10.0000 mg | ORAL_TABLET | Freq: Every day | ORAL | Status: DC

## 2013-07-09 MED ORDER — ACETAMINOPHEN 325 MG PO TABS: 325.0000 mg | ORAL_TABLET | ORAL | Status: DC | PRN

## 2013-07-09 MED ORDER — AMLODIPINE BESYLATE 10 MG PO TABS: 10.0000 mg | ORAL_TABLET | Freq: Every morning | ORAL | Status: DC

## 2013-07-09 NOTE — Progress Notes (Signed)
Patient ID: [name], male   DOB: [birth year], 52 y.o.   MRN: [id number] Subjective/Complaints: 52 y.o. 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 Patient states to be independent prior to admission living with his wife who works day shift. Admitted 06/08/2013 with right-sided weakness. MRI of the brain showed a 1.5 cm region of acute infarction affecting the white matter adjacent to posterior body of the left lateral ventricle with extension into the external capsule region as well as extensive ischemic changes throughout the brain. Carotid Dopplers with less than 40% ICA stenosis. Echocardiogram with ejection fraction of 70% grade 1 diastolic dysfunction. Urine drug screen was negative. Patient did not receive TPA. Neurology services consulted maintained on aspirin therapy for CVA prophylaxis as well as subcutaneous Lovenox for DVT prophylaxis  No issues overnite  A 12 point review of systems has been performed and if not noted above is otherwise negative.   Objective: Vital Signs: Blood pressure 122/88, pulse 77, temperature 98.4 F (36.9 C), temperature source Oral, resp. rate 18, height 5' 6.14" (1.68 m), weight 81.647 kg (180 lb), SpO2 98.00%. No results found. No results found for this or any previous visit (from the past 72 hour(s)).   HEENT: normal Cardio: tachy and no murmurs Resp: CTA B/L and unlabored GI: BS positive and non tender Extremity:  Pulses positive and No Edema Skin:   Intact Neuro: Alert/Oriented, Cranial Nerve II-XII normal, Abnormal Sensory decreased LUE/LLE LT (distal more so than prox), Abnormal Motor 3-/5 L delt,bi,tri,grip,3+ HF,KE ADF,4/5 on R side  Abnormal FMC Ataxic/ dec FMC and Tone  Hypertonic on L side 2-3/4 mainly arm and wrist and finger flexors, Tone:  normal tone on R side and Dysarthric Musc/Skel:  No R shoulder pain with overhead activity, -  impingement signs Gen NAD   Assessment/Plan: 1. 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 Physiatrist is providing close team supervision and 24 hour management of active medical problems listed below. Physiatrist and rehab team continue to assess barriers to discharge/monitor patient progress toward functional and medical goals.  FIM - Bathing Bathing Steps Patient Completed: Chest;Right Arm;Left Arm;Abdomen;Left upper leg;Right upper leg;Buttocks;Left lower leg (including foot);Right lower leg (including foot);Front perineal area Bathing: 5: Supervision: Safety issues/verbal cues  FIM - Upper Body Dressing/Undressing Upper body dressing/undressing steps patient completed: Thread/unthread right sleeve of pullover shirt/dresss;Thread/unthread left sleeve of pullover shirt/dress;Put head through opening of pull over shirt/dress;Pull shirt over trunk Upper body dressing/undressing: 6: More than reasonable amount of time FIM - Lower Body Dressing/Undressing Lower body dressing/undressing steps patient completed: Thread/unthread right underwear leg;Thread/unthread left underwear leg;Pull underwear up/down;Thread/unthread right pants leg;Thread/unthread left pants leg;Don/Doff right shoe;Pull pants up/down;Don/Doff right sock;Don/Doff left sock;Don/Doff left shoe;Fasten/unfasten right shoe;Fasten/unfasten left shoe Lower body dressing/undressing: 5: Supervision: Safety issues/verbal cues  FIM - Toileting Toileting steps completed by patient: Adjust clothing prior to toileting;Performs perineal hygiene;Adjust clothing after toileting Toileting Assistive Devices: Grab bar or rail for support Toileting: 6: More than reasonable amount of time  FIM - Diplomatic Services operational officer Devices: Grab  bars;Elevated toilet seat Toilet Transfers: 6-More than reasonable amt of time  FIM - Banker Devices: Therapist, occupational: 6: Supine > Sit: No assist;6: Sit > Supine: No assist;5: Bed >  Chair or W/C: Supervision (verbal cues/safety issues);5: Chair or W/C > Bed: Supervision (verbal cues/safety issues)  FIM - Locomotion: Wheelchair Distance: 75 Locomotion: Wheelchair: 6: Travels 150 ft or more, turns around, maneuvers to table, bed or toilet, negotiates 3% grade: maneuvers on rugs and over door sills independently FIM - Locomotion: Ambulation Locomotion: Ambulation Assistive Devices: Designer, industrial/product Ambulation/Gait Assistance: 5: Supervision Locomotion: Ambulation: 5: Travels 150 ft or more with supervision/safety issues  Comprehension Comprehension Mode: Auditory Comprehension: 5-Understands complex 90% of the time/Cues < 10% of the time  Expression Expression Mode: Verbal Expression: 6-Expresses complex ideas: With extra time/assistive device  Social Interaction Social Interaction: 5-Interacts appropriately 90% of the time - Needs monitoring or encouragement for participation or interaction.  Problem Solving Problem Solving: 5-Solves basic problems: With no assist  Memory Memory: 5-Recognizes or recalls 90% of the time/requires cueing < 10% of the time  Medical Problem List and Plan:  1. Thrombotic left periventricular,EC infarct  2. DVT Prophylaxis/Anticoagulation: Subcutaneous Lovenox. Monitor platelet counts and any signs of bleeding  3. Pain Management: Tylenol as needed,  4. Neuropsych: This patient is capable of making decisions on his own behalf.   - trazodone for sleep 5. Hypertension. Presently on Norvasc 10 mg daily. Monitor with increased mobility.  , hydrochlorothiazide 25 mg daily. Resume  Lisinopril at 20mg  ( 40 mg daily prior to admission. ) (also on tenormin PTA).   -continue to follow and adjust as needed, DBP  still elevated   6. 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 Latest urine drug screen negative  7. Hyperlipidemia. Zocor  8. 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  - increased baclofen to 20mg  tid,  Seems to be controlling spasticity with the exception of wrist and finger flexors,   -continue ROM, left resting hand splint  9. Postop shoulder pain . 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 Continue OT/PT,  , also has chronic left shoulder pain  LOS (Days) 28 A FACE TO FACE EVALUATION WAS PERFORMED  Griffin Dewilde E 07/09/2013, 8:04 AM

## 2013-07-09 NOTE — Discharge Instructions (Signed)
Inpatient Rehab Discharge Instructions  [name] Discharge date and time: No discharge date for patient encounter.   Activities/Precautions/ Functional Status: Activity: activity as tolerated Diet: regular diet Wound Care: none needed Functional status:  ___ No restrictions     ___ Walk up steps independently ___ 24/7 supervision/assistance   ___ Walk up steps with assistance ___ Intermittent supervision/assistance  ___ Bathe/dress independently ___ Walk with walker     ___ Bathe/dress with assistance ___ Walk Independently    ___ Shower independently _x STROKE/TIA DISCHARGE INSTRUCTIONS SMOKING Cigarette smoking nearly doubles your risk of having a stroke & is the single most alterable risk factor  If you smoke or have smoked in the last 12 months, you are advised to quit smoking for your health.  Most of the excess cardiovascular risk related to smoking disappears within a year of stopping.  Ask you doctor about anti-smoking medications  [name] Quit Line: 1-800-QUIT NOW  Free Smoking Cessation Classes [phone]  CHOLESTEROL Know your levels; limit fat & cholesterol in your diet  Lipid Panel     Component Value Date/Time   CHOL 260* 06/08/2013 0040   TRIG 105 06/08/2013 0040   HDL 52 06/08/2013 0040   CHOLHDL 5.0 06/08/2013 0040   VLDL 21 06/08/2013 0040   LDLCALC 187* 06/08/2013 0040      Many patients benefit from treatment even if their cholesterol is at goal.  Goal: Total Cholesterol (CHOL) less than 160  Goal:  Triglycerides (TRIG) less than 150  Goal:  HDL greater than 40  Goal:  LDL (LDLCALC) less than 100   BLOOD PRESSURE American Stroke Association blood pressure target is less that 120/80 mm/Hg  Your discharge blood pressure is:  BP: 123/84 mmHg  Monitor your blood pressure  Limit your salt and alcohol intake  Many individuals will require more than one medication for high blood pressure  DIABETES (A1c is a blood sugar average for last 3 months) Goal  HGBA1c is under 7% (HBGA1c is blood sugar average for last 3 months)  Diabetes: No known diagnosis of diabetes    Lab Results  Component Value Date   HGBA1C 5.8* 06/08/2013     Your HGBA1c can be lowered with medications, healthy diet, and exercise.  Check your blood sugar as directed by your physician  Call your physician if you experience unexplained or low blood sugars.  PHYSICAL ACTIVITY/REHABILITATION Goal is 30 minutes at least 4 days per week  Activity: {STROKE DC ACTIVITIES:22360} Therapies: {STROKE DC THERAPIES:22361} Return to work: ***  Activity decreases your risk of heart attack and stroke and makes your heart stronger.  It helps control your weight and blood pressure; helps you relax and can improve your mood.  Participate in a regular exercise program.  Talk with your doctor about the best form of exercise for you (dancing, walking, swimming, cycling).  DIET/WEIGHT Goal is to maintain a healthy weight  Your discharge diet is: Cardiac *** liquids Your height is:  Height: 5' 6.14" (168 cm) Your current weight is: Weight: 81.647 kg (180 lb) Your Body Mass Index (BMI) is:  BMI (Calculated): 22.5  Following the type of diet specifically designed for you will help prevent another stroke.  Your goal weight range is:  ***  Your goal Body Mass Index (BMI) is 19-24.  Healthy food habits can help reduce 3 risk factors for stroke:  High cholesterol, hypertension, and excess weight.  RESOURCES Stroke/Support Group:  Call [phone]   STROKE EDUCATION PROVIDED/REVIEWED AND  GIVEN TO PATIENT Stroke warning signs and symptoms How to activate emergency medical system (call 911). Medications prescribed at discharge. Need for follow-up after discharge. Personal risk factors for stroke. Pneumonia vaccine given: {STROKE DC YES/NO/DATE:22363} Flu vaccine given: {STROKE DC YES/NO/DATE:22363} My questions have been answered, the writing is legible, and I understand these  instructions.  I will adhere to these goals & educational materials that have been provided to me after my discharge from the hospital.    __ Walk with assistance    ___ Shower with assistance ___ No alcohol     ___ Return to work/school ________  Special Instructions:    COMMUNITY REFERRALS UPON DISCHARGE:    Home Health:   PT, OT, RN  Agency:[facility] Phone:[phone] Date of last service:07/09/2013  Medical Equipment/Items Ordered:WHEELCHAIR,  [name] ON TUB BENCH DUE TO COST  Agency/Supplier:[facility] Other:  GENERAL COMMUNITY RESOURCES FOR PATIENT/FAMILY: Support Groups:STROKE WARRIORS & CVA SUPPORT GROUP  My questions have been answered and I understand these instructions. I will adhere to these goals and the provided educational materials after my discharge from the hospital.  Patient/Caregiver Signature _______________________________ Date __________  Clinician Signature _______________________________________ Date __________  Please bring this form and your medication list with you to all your follow-up doctor's appointments.

## 2013-07-09 NOTE — Progress Notes (Signed)
Social Work Discharge Note Discharge Note  The overall goal for the admission was met for:   Discharge location: Yes-HOME WITH WIFE AND SON WHO WILL PROVIDE 24 HR SUPERVISION  Length of Stay: Yes-28 DAYS  Discharge activity level: Yes-SUPERVISION/MIN LEVEL  Home/community participation: Yes  Services provided included: MD, RD, PT, OT, RN, TR, Pharmacy and SW  Financial Services: Private Insurance: [name]  Follow-up services arranged: Home Health: [facility]-PT,OT,RN, DME: [facility]-WHEELCHAIR, ROLLING WALKER and Patient/Family has no preference for HH/DME agencies  Comments (or additional information):PT PLANS TO GET TUB BENCH LATER-WANTS TO TRY TUB SEAT ALREADY HAVE.  AWARE 24 HR SUPERVISION RECOMMENDED. FAMILY EDUCATION COMPLETED AND READY FOR DISCHARGE  Patient/Family verbalized understanding of follow-up arrangements: Yes  Individual responsible for coordination of the follow-up plan: SELF & AMY-WIFE  Confirmed correct DME delivered: Lucy Chris 07/09/2013    Lucy Chris

## 2013-07-09 NOTE — Progress Notes (Signed)
Social Work Patient ID: [name], male   DOB: 1961-01-16, 52 y.o.   MRN: [id number] Wife expressed no concerns feels comfortable with pt's discharge.  Aware of cost of tub bench and will wait to see if tub seat works at home, already have. Pt ready to go home and reports son will be there with while wife works.  No further questions or concerns.

## 2013-07-09 NOTE — Progress Notes (Signed)
Patient discharged home at 1115 with family. NT accompanied patient out of building via wheelchair with all patient equipment and belongings.  Harvel Ricks, PA gave and reviewed discharge instructions with patient and family.  Patient and family verbalized understanding of discharge instructions Dani Gobble

## 2013-07-11 ENCOUNTER — Ambulatory Visit: Payer: Medicare Other

## 2013-07-16 ENCOUNTER — Encounter: Payer: Medicare Other | Admitting: Internal Medicine

## 2013-07-16 ENCOUNTER — Encounter: Payer: Self-pay | Admitting: Internal Medicine

## 2013-07-16 ENCOUNTER — Ambulatory Visit: Payer: Medicare Other | Admitting: Occupational Therapy

## 2013-07-19 ENCOUNTER — Encounter: Payer: Self-pay | Admitting: Internal Medicine

## 2013-07-19 ENCOUNTER — Ambulatory Visit (INDEPENDENT_AMBULATORY_CARE_PROVIDER_SITE_OTHER): Payer: Medicare Other | Admitting: Internal Medicine

## 2013-07-19 VITALS — BP 125/84 | HR 83 | Temp 97.3°F | Ht 66.0 in | Wt 182.3 lb

## 2013-07-19 DIAGNOSIS — I6350 Cerebral infarction due to unspecified occlusion or stenosis of unspecified cerebral artery: Secondary | ICD-10-CM

## 2013-07-19 DIAGNOSIS — E785 Hyperlipidemia, unspecified: Secondary | ICD-10-CM

## 2013-07-19 DIAGNOSIS — I639 Cerebral infarction, unspecified: Secondary | ICD-10-CM

## 2013-07-19 DIAGNOSIS — Z0000 Encounter for general adult medical examination without abnormal findings: Secondary | ICD-10-CM

## 2013-07-19 DIAGNOSIS — I10 Essential (primary) hypertension: Secondary | ICD-10-CM

## 2013-07-19 LAB — COMPLETE METABOLIC PANEL WITH GFR
AST: 26 U/L (ref 0–37)
Albumin: 4.2 g/dL (ref 3.5–5.2)
Alkaline Phosphatase: 92 U/L (ref 39–117)
Calcium: 9.9 mg/dL (ref 8.4–10.5)
Chloride: 103 mEq/L (ref 96–112)
GFR, Est Non African American: 59 mL/min — ABNORMAL LOW
Glucose, Bld: 95 mg/dL (ref 70–99)
Potassium: 3.9 mEq/L (ref 3.5–5.3)
Sodium: 140 mEq/L (ref 135–145)
Total Protein: 7.3 g/dL (ref 6.0–8.3)

## 2013-07-19 NOTE — Progress Notes (Signed)
Patient ID: [name], male   DOB: 06/30/1961, 52 y.o.   MRN: [id number] Subjective:   Patient ID: [name] male   DOB: May 02, 1961 52 y.o.   MRN: [id number]  CC:  Hospital followup visit.  HPI:  [name] is a 52 y.o. man with past medical history as outlined below, who presents for a hospital followup visit today.  1. Stroke: Patient had ICH in 1999 as well as in 2005 secondary to cocaine with residual left-sided weakness. Recently, he was hospitalized from 7/29-8/26 because of new ischemic stroke, confirmed by MRI of brain. He had a 2-D echo in hospital, which showed EF 65-70% with grade 1 diastolic dysfunction. His carotid Doppler showed low grade stenoses (less than 40%) in ICA. Patient completed a five-week course of rehabilitation. He's currently taking aspirin. Today he feels good. He does not have any specific compliance. He has no difficulty in swallowing. He does not have choking episode when eating food. He does not have headache, new episode of weakness, numbness or decreased sensations. His muscle strength is okay, but he has poor balance. He is on wheelchair because of her poor balance.  2. HTN: Patient is currently taking hydrochlorothiazide, lisinopril and amlodipine. Today his blood pressure is normal, 125/84 mmHg. He does not have chest pain, shortness of breath, palpitation, leg edema.  3. HLD: Patient's LDL was 187 on 06/08/13. His goal for LDL should be less than 100, ideally less than 70. He was taking pravastatin before, which was switched to Lipitor 10 mg daily. His liver function was abnormal. His AST and ALT was 74 and 88, respectively on 06/12/13. He has negative hepatitis panel on 01/03/13. He taking Tylenol occasionally. I advised him to stop taking these Tylenol from now.   ROS:  Denies fever, chills, fatigue, headaches,  cough, chest pain, SOB,  abdominal pain,diarrhea, constipation, dysuria, urgency, frequency, hematuria.    Past Medical History   Diagnosis Date  . Hyperlipidemia     elevated transaminases on statin?-40's (2/06) - Hep B/C negative 4/06; resolution on 4/06 labs; off statin in 2006  . Hypertension     concentric LVH 2d echo 12/05; negative proteinuria 11/00  . 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 Diastolic dysfunction     impaired L ventricle relatation with an EF of 65% and echo in December 2005  . Onychomycosis April 2003    right big toe  . Chest pain     with a negative adenosine cardiolite in December 2006. Dr. Robet Leu   . Tinea versicolor     dermatology referral; secondary to no treatement response to selenium sulfide; currently resolive  . 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 Wanda Plump in 9/05. no BPH at that visit  . Erectile dysfunction   . Arthritis    Current Outpatient Prescriptions  Medication Sig Dispense Refill  . amLODipine (NORVASC) 10 MG tablet Take 1 tablet (10 mg total) by mouth every morning.  30 tablet  1  . aspirin  EC 325 MG EC tablet Take 1 tablet (325 mg total) by mouth daily.  30 tablet  0  . atorvastatin (LIPITOR) 10 MG tablet Take 1 tablet (10 mg total) by mouth daily at 6 PM.  30 tablet  1  . baclofen (LIORESAL) 20 MG tablet Take 1 tablet (20 mg total) by mouth 3 (three) times daily.  90 each  1  . hydrochlorothiazide (HYDRODIURIL) 25 MG tablet Take 1 tablet (25 mg total) by mouth every morning.  30 tablet  1  . lisinopril (PRINIVIL,ZESTRIL) 20 MG tablet Take 1 tablet  (20 mg total) by mouth daily.  30 tablet  1  . Multiple Vitamins-Minerals (HM MULTIVITAMIN ADULT GUMMY PO) Take 1 tablet by mouth every morning.      . traMADol (ULTRAM) 50 MG tablet Take 1-2 tablets (50-100 mg total) by mouth 2 (two) times daily as needed.  60 tablet  0   No current facility-administered medications for this visit.   No family history on file. History   Social History  . Marital Status: Married    Spouse Name: N/A    Number of Children: N/A  . Years of Education: N/A   Social History Main Topics  . Smoking status: Never Smoker   . Smokeless tobacco: Never Used  . Alcohol Use: Yes     Comment: ocassional use  . Drug Use: No     Comment: former cocaine abuse, used marjuana 1999  . Sexual Activity: Not on file     Comment: recently finished [employer] computer associate degree, looking for job, on disability for now   Other Topics Concern  . Not on file   Social History Narrative  . No narrative on file    Review of Systems: Full 14-point review of systems otherwise negative. See HPI.   Objective:  Physical Exam: Filed Vitals:   07/19/13 1130  BP: 125/84  Pulse: 83  Temp: 97.3 F (36.3 C)  TempSrc: Oral  Height: 5\' 6"  (1.676 m)  Weight: 182 lb 4.8 oz (82.691 kg)  SpO2: 98%   Gen: pt sitting in bed. Generally restless, anxious HENT: dentition fair Head: Normocephalic.   Eyes: EOM are normal.   Neck: Normal range of motion. Neck supple. No thyromegaly present.   Cardiovascular: Normal rate and regular rhythm. No murmurs Pulmonary/Chest: Effort normal and breath sounds normal. No respiratory distress. No wheezes, rales, rhonchi Abdominal: Soft. Bowel sounds are normal. He exhibits no distension.  Neurological: oriented X 3,  muscles strength 4+/5 in arms bilaterally, 5/5 in lower extremities bilaterally. Has mild muscle rigidity in left arm. Decreased sensation to LT in right hand. DTR 2+ in brachial and knee reflex bilaterally. Skin: Skin is warm and  dry.    Assessment & Plan:

## 2013-07-19 NOTE — Assessment & Plan Note (Signed)
Patient has very significant history of stroke, including both hemorrhagic stroke in the past and a new ischemic stroke recently. He is generally doing okay. His muscle strength is fine, but he has poor balance. He is currently using wheelchairs. He completed a five-week course of rehabilitation. Today he does not have any complaints. He did not have episode of choking when he eats food. He's currently taking aspirin 325 mg daily for secondary prevention. His blood pressure is well controlled. Today blood pressure is normal. He's taking low dose of Lipitor 10 mg daily.   -will continue current regimen and follow up in three months.

## 2013-07-19 NOTE — Assessment & Plan Note (Addendum)
Patient's LDL was 187 on 06/08/13. He was switched from pravastatin 40 mg daily to Lipitor 10 mg daily. He might be benefited from a high dose of Lipitor. His liver function was abnormal with elevated AST and ALT on 70/30/14. His hepatitis panel was negative on 01/03/13.   -Will recheck his CMP today. If her liver function is okay, we'll increase his Lipitor to at least 40 or even 80 mg daily.  - Will d/c his tylenol today.

## 2013-07-19 NOTE — Assessment & Plan Note (Signed)
-   patient refused flu shot today. Will postpone

## 2013-07-19 NOTE — Patient Instructions (Signed)
1. Please stop taking tylenol from now, since your liver function is not completely normal as I explained to you.  2. Please take all medications as prescribed.  3. If you have worsening of your symptoms or new symptoms arise, please call the clinic ([phone]), or go to the ER immediately if symptoms are severe.  You have done great job in taking all your medications. I appreciate it very much. Please continue doing that.

## 2013-07-19 NOTE — Assessment & Plan Note (Signed)
Blood pressure is well controlled. Today blood pressure is 125/84. Willl continue current regimen.

## 2013-07-20 NOTE — Progress Notes (Signed)
Case discussed with Dr. Niu at the time of the visit.  We reviewed the resident's history and exam and pertinent patient test results.  I agree with the assessment, diagnosis, and plan of care documented in the resident's note.    

## 2013-08-06 ENCOUNTER — Ambulatory Visit (HOSPITAL_BASED_OUTPATIENT_CLINIC_OR_DEPARTMENT_OTHER): Payer: Medicare Other | Admitting: Physical Medicine & Rehabilitation

## 2013-08-06 ENCOUNTER — Encounter: Payer: Medicare Other | Attending: Physical Medicine & Rehabilitation

## 2013-08-06 ENCOUNTER — Encounter: Payer: Self-pay | Admitting: Physical Medicine & Rehabilitation

## 2013-08-06 VITALS — BP 130/86 | HR 84 | Resp 14 | Ht 66.0 in | Wt 182.0 lb

## 2013-08-06 DIAGNOSIS — I69959 Hemiplegia and hemiparesis following unspecified cerebrovascular disease affecting unspecified side: Secondary | ICD-10-CM | POA: Insufficient documentation

## 2013-08-06 DIAGNOSIS — G8110 Spastic hemiplegia affecting unspecified side: Secondary | ICD-10-CM | POA: Insufficient documentation

## 2013-08-06 NOTE — Patient Instructions (Addendum)
OnabotulinumtoxinA injection (Medical Use) What is this medicine? ONABOTULINUMTOXINA is a neuro-muscular blocker. This medicine is used to treat crossed eyes, eyelid spasms, severe neck muscle spasms, and elbow, wrist, and finger muscle spasms. It is also used to treat excessive underarm sweating, to prevent chronic migraine headaches, and to treat loss of bladder control due to neurologic conditions such as multiple sclerosis or spinal cord injury. This medicine may be used for other purposes; ask your health care provider or pharmacist if you have questions. What should I tell my health care provider before I take this medicine? They need to know if you have any of these conditions: -breathing problems -cerebral palsy spasms -difficulty urinating -heart problems -history of surgery where this medicine is going to be used -infection at the site where this medicine is going to be used -myasthenia gravis or other neurologic disease -nerve or muscle disease -surgery plans -take medicines that treat or prevent blood clots -thyroid problems -an unusual or allergic reaction to botulinum toxin, albumin, other medicines, foods, dyes, or preservatives -pregnant or trying to get pregnant -breast-feeding How should I use this medicine? This medicine is for injection into a muscle. It is given by a health care professional in a hospital or clinic setting. Talk to your pediatrician regarding the use of this medicine in children. While this drug may be prescribed for children as young as 63 years old for selected conditions, precautions do apply. Overdosage: If you think you have taken too much of this medicine contact a poison control center or emergency room at once. NOTE: This medicine is only for you. Do not share this medicine with others. What if I miss a dose? This does not apply. What may interact with this medicine? -aminoglycoside antibiotics like gentamicin, neomycin, tobramycin -muscle  relaxants -other botulinum toxin injections This list may not describe all possible interactions. Give your health care provider a list of all the medicines, herbs, non-prescription drugs, or dietary supplements you use. Also tell them if you smoke, drink alcohol, or use illegal drugs. Some items may interact with your medicine. What should I watch for while using this medicine? Visit your doctor for regular check ups. This medicine will cause weakness in the muscle where it is injected. Tell your doctor if you feel unusually weak in other muscles. Get medical help right away if you have problems with breathing, swallowing, or talking. This medicine might make your eyelids droop or make you see blurry or double. If you have weak muscles or trouble seeing do not drive a car, use machinery, or do other dangerous activities. This medicine contains albumin from human blood. It may be possible to pass an infection in this medicine, but no cases have been reported. Talk to your doctor about the risks and benefits of this medicine. If your activities have been limited by your condition, go back to your regular routine slowly after treatment with this medicine. What side effects may I notice from receiving this medicine? Side effects that you should report to your doctor or health care professional as soon as possible: -allergic reactions like skin rash, itching or hives, swelling of the face, lips, or tongue -breathing problems -changes in vision -chest pain or tightness -eye irritation, pain -fast, irregular heartbeat -infection -numbness -speech problems -swallowing problems -unusual weakness Side effects that usually do not require medical attention (report to your doctor or health care professional if they continue or are bothersome): -bruising or pain at site where injected -drooping eyelid -dry eyes  or mouth -headache -muscles aches, pains -sensitivity to light -tearing This list may not  describe all possible side effects. Call your doctor for medical advice about side effects. You may report side effects to FDA at 1-800-FDA-1088. Where should I keep my medicine? This drug is given in a hospital or clinic and will not be stored at home. NOTE: This sheet is a summary. It may not cover all possible information. If you have questions about this medicine, talk to your doctor, pharmacist, or health care provider.  2013, [employer]. (09/07/2010 8:06:56 AM)

## 2013-08-06 NOTE — Progress Notes (Signed)
Subjective:    Patient ID: [name], male    DOB: [birth year], 52 y.o.   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 The patient independent prior to admission, living with  his wife. Admitted June 08, 2013, with right-sided weakness. MRI of  the brain showed a 1.5 cm region of acute infarction affecting the white  matter adjacent to posterior body of the left lateral ventricle with  extension to the external capsule region as well as extensive ischemic  changes throughout the brain. Carotid Dopplers with less than 40% ICA  stenosis. Echocardiogram with ejection fraction of 70% grade 1  diastolic dysfunction  HPI DATE OF ADMISSION: 06/11/2013  DATE OF DISCHARGE: 07/09/2013  Home health PT and OT Finishing this week Still complaining of left upper extremity tightness difficulty with positioning as well as difficulty with functional use Pain Inventory Average Pain 5 Pain Right Now 5 My pain is sharp  In the last 24 hours, has pain interfered with the following? General activity 5 Relation with others 5 Enjoyment of life 5 What TIME of day is your pain at its worst? n/a Sleep (in general) Fair  Pain is worse with: unsure Pain improves with: n/a Relief from Meds: n/a  Mobility walk with assistance use a cane use a walker ability to climb steps?  yes use a wheelchair transfers alone  Function not employed: date last employed . disabled: date disabled .  Neuro/Psych weakness numbness tingling trouble walking spasms dizziness  Prior Studies CT/MRI  Physicians involved in your care Primary care .   History reviewed. No pertinent family history. History   Social History  . Marital Status: Married    Spouse Name: N/A    Number of Children: N/A  . Years of Education: N/A   Social History Main Topics  . Smoking status: Never Smoker   . Smokeless  tobacco: Never Used  . Alcohol Use: Yes     Comment: ocassional use  . Drug Use: No     Comment: former cocaine abuse, used marjuana 1999  . Sexual Activity: None     Comment: recently finished [employer] associate degree, looking for job, on disability for now   Other Topics Concern  . None   Social History Narrative  . None   Past Surgical History  Procedure Laterality Date  . Colonoscopy with propofol N/A 05/22/2013    Procedure: COLONOSCOPY WITH PROPOFOL;  Surgeon: Willis Modena, MD;  Location: [facility];  Service: Endoscopy;  Laterality: N/A;   Past Medical History  Diagnosis Date  . Hyperlipidemia     elevated transaminases on statin?-40's (2/06) - Hep B/C negative 4/06; resolution on 4/06 labs; off statin in 2006  . Hypertension     concentric LVH 2d echo 12/05; negative proteinuria 11/00  . 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 Diastolic dysfunction     impaired L ventricle relatation with an EF of 65% and echo in December 2005  . Onychomycosis April 2003    right big toe  .  Chest pain     with a negative adenosine cardiolite in December 2006. Dr. Robet Leu   . Tinea versicolor     dermatology referral; secondary to no treatement response to selenium sulfide; currently resolive  . 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 Wanda Plump in 9/05. no BPH at that visit  . Erectile dysfunction   . Arthritis    BP 130/86  Pulse 84  Resp 14  Ht 5\' 6"  (1.676 m)   Wt 182 lb (82.555 kg)  BMI 29.39 kg/m2  SpO2 96%     Review of Systems  Gastrointestinal: Positive for nausea and constipation.  Musculoskeletal: Positive for gait problem.  Neurological: Positive for dizziness, weakness and numbness.  All other systems reviewed and are negative.       Objective:   Physical Exam Ashworth grade 3 spasticity in the left finger flexors Ashworth grade 2 in the wrist flexors Ashworth grade 3 at the elbow flexors and Ashworth grade 3 at the arm adductors Motor strength is 4/5 in the right deltoid, bicep, tricep, grip, hip flexor, knee extensors, ankle dorsiflexor and plantar flexor 3 minus/5 in the left deltoid, bicep, tricep, grip, hip flexor, knee extensors, ankle dorsiflexor plantar flexor Mood and affect are appropriate General no acute distress     Assessment & Plan:  1. 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 This does inhibit functional usage as well as contribute to pain from difficulty with positioning. Will treat with Botox 100 units daily bicep, 50 units brachioradialis, 50 units pectoralis, 50 units FCR, 50 units FCU, 50 units FDS, 50 units FDP  Discussed risks and benefits. Discussed reasonable expectations.  Also will make referral to neuro rehabilitation PT and OT to begin once home health is finished. Also recommend that patient makes appointment with neurology  Primary care followup through the internal medicine clinic at [facility]

## 2013-08-08 ENCOUNTER — Other Ambulatory Visit: Payer: Self-pay | Admitting: *Deleted

## 2013-08-08 ENCOUNTER — Telehealth: Payer: Self-pay | Admitting: *Deleted

## 2013-08-08 DIAGNOSIS — G8110 Spastic hemiplegia affecting unspecified side: Secondary | ICD-10-CM

## 2013-08-08 DIAGNOSIS — I10 Essential (primary) hypertension: Secondary | ICD-10-CM

## 2013-08-08 DIAGNOSIS — M25519 Pain in unspecified shoulder: Secondary | ICD-10-CM

## 2013-08-08 DIAGNOSIS — E785 Hyperlipidemia, unspecified: Secondary | ICD-10-CM

## 2013-08-08 NOTE — Telephone Encounter (Signed)
Return pt's call about message he had left; no answer - message left to call the clinic.

## 2013-08-08 NOTE — Telephone Encounter (Signed)
Call from pt - requesting 90 day supply of all medications and changing to home delivery pharmacy which he states will be easier for him - request [facility]. Thanks

## 2013-08-09 MED ORDER — LISINOPRIL 20 MG PO TABS: 20.0000 mg | ORAL_TABLET | Freq: Every day | ORAL | Status: DC

## 2013-08-09 MED ORDER — BACLOFEN 20 MG PO TABS: 20.0000 mg | ORAL_TABLET | Freq: Three times a day (TID) | ORAL | Status: DC

## 2013-08-09 MED ORDER — AMLODIPINE BESYLATE 10 MG PO TABS: 10.0000 mg | ORAL_TABLET | Freq: Every morning | ORAL | Status: DC

## 2013-08-09 MED ORDER — TRAMADOL HCL 50 MG PO TABS: 50.0000 mg | ORAL_TABLET | Freq: Two times a day (BID) | ORAL | Status: DC | PRN

## 2013-08-09 MED ORDER — ASPIRIN 325 MG PO TBEC: 325.0000 mg | DELAYED_RELEASE_TABLET | Freq: Every day | ORAL | Status: DC

## 2013-08-09 MED ORDER — HYDROCHLOROTHIAZIDE 25 MG PO TABS: 25.0000 mg | ORAL_TABLET | Freq: Every morning | ORAL | Status: DC

## 2013-08-09 MED ORDER — ATORVASTATIN CALCIUM 10 MG PO TABS: 10.0000 mg | ORAL_TABLET | Freq: Every day | ORAL | Status: DC

## 2013-08-09 NOTE — Telephone Encounter (Signed)
Norvasc, Lipitor,Baclofen, HCTZ ,Lisinopril, and Tramadol rxs faxed to [phone]).

## 2013-08-12 ENCOUNTER — Ambulatory Visit: Payer: Medicare Other | Admitting: Physical Therapy

## 2013-08-12 ENCOUNTER — Encounter: Payer: Medicare Other | Admitting: Occupational Therapy

## 2013-08-16 ENCOUNTER — Telehealth: Payer: Self-pay | Admitting: *Deleted

## 2013-08-16 NOTE — Telephone Encounter (Signed)
Calling about needing 90 day refills [facility].  I called him back and told him Dr Wynn Banker was not ordering his meds and he needs to call DR Brien Few Niu's office

## 2013-08-21 ENCOUNTER — Ambulatory Visit: Payer: Medicare Other | Admitting: Physical Therapy

## 2013-08-21 ENCOUNTER — Ambulatory Visit: Payer: Medicare Other | Attending: Physical Medicine & Rehabilitation | Admitting: Occupational Therapy

## 2013-08-21 DIAGNOSIS — M6281 Muscle weakness (generalized): Secondary | ICD-10-CM | POA: Insufficient documentation

## 2013-08-21 DIAGNOSIS — M2420 Disorder of ligament, unspecified site: Secondary | ICD-10-CM | POA: Insufficient documentation

## 2013-08-21 DIAGNOSIS — R269 Unspecified abnormalities of gait and mobility: Secondary | ICD-10-CM | POA: Insufficient documentation

## 2013-08-21 DIAGNOSIS — I69959 Hemiplegia and hemiparesis following unspecified cerebrovascular disease affecting unspecified side: Secondary | ICD-10-CM | POA: Insufficient documentation

## 2013-08-21 DIAGNOSIS — R279 Unspecified lack of coordination: Secondary | ICD-10-CM | POA: Insufficient documentation

## 2013-08-21 DIAGNOSIS — M629 Disorder of muscle, unspecified: Secondary | ICD-10-CM | POA: Insufficient documentation

## 2013-08-21 DIAGNOSIS — M25519 Pain in unspecified shoulder: Secondary | ICD-10-CM | POA: Insufficient documentation

## 2013-08-27 ENCOUNTER — Ambulatory Visit: Payer: Medicare Other | Admitting: Physical Therapy

## 2013-08-27 ENCOUNTER — Ambulatory Visit: Payer: Medicare Other | Admitting: Occupational Therapy

## 2013-08-29 ENCOUNTER — Ambulatory Visit: Payer: Medicare Other | Admitting: Occupational Therapy

## 2013-08-29 ENCOUNTER — Ambulatory Visit: Payer: Medicare Other | Admitting: Physical Therapy

## 2013-09-03 ENCOUNTER — Encounter: Payer: Self-pay | Admitting: Physical Medicine & Rehabilitation

## 2013-09-03 ENCOUNTER — Ambulatory Visit (HOSPITAL_BASED_OUTPATIENT_CLINIC_OR_DEPARTMENT_OTHER): Payer: Medicare Other | Admitting: Physical Medicine & Rehabilitation

## 2013-09-03 ENCOUNTER — Ambulatory Visit: Payer: Medicare Other | Admitting: Occupational Therapy

## 2013-09-03 ENCOUNTER — Ambulatory Visit: Payer: Medicare Other | Admitting: Physical Therapy

## 2013-09-03 ENCOUNTER — Encounter: Payer: Medicare Other | Attending: Physical Medicine & Rehabilitation

## 2013-09-03 VITALS — BP 119/80 | HR 91 | Resp 14 | Ht 66.0 in | Wt 178.0 lb

## 2013-09-03 DIAGNOSIS — I69959 Hemiplegia and hemiparesis following unspecified cerebrovascular disease affecting unspecified side: Secondary | ICD-10-CM | POA: Insufficient documentation

## 2013-09-03 DIAGNOSIS — G8110 Spastic hemiplegia affecting unspecified side: Secondary | ICD-10-CM

## 2013-09-03 NOTE — Patient Instructions (Signed)

## 2013-09-03 NOTE — Progress Notes (Signed)
Botox Injection for spasticity using needle EMG guidance  Dilution: 50 Units/ml Indication: Severe spasticity which interferes with ADL,mobility and/or  hygiene and is unresponsive to medication management and other conservative care Informed consent was obtained after describing risks and benefits of the procedure with the patient. This includes bleeding, bruising, infection, excessive weakness, or medication side effects. A REMS form is on file and signed. Needle: 27gauge 1 inch needle electrode Number of units per muscle Pectoralis100 Biceps100 FCR25 FCU25 FDS25 FDP50 FPL25 Brachiorad 50 All injections were done after obtaining appropriate EMG activity and after negative drawback for blood. The patient tolerated the procedure well. Post procedure instructions were given. A followup appointment was made.

## 2013-09-05 ENCOUNTER — Ambulatory Visit: Payer: Medicare Other | Admitting: Occupational Therapy

## 2013-09-05 ENCOUNTER — Ambulatory Visit: Payer: Medicare Other | Admitting: Physical Therapy

## 2013-09-09 ENCOUNTER — Ambulatory Visit: Payer: Medicare Other | Admitting: Physical Therapy

## 2013-09-09 ENCOUNTER — Ambulatory Visit: Payer: Medicare Other | Admitting: Occupational Therapy

## 2013-09-11 ENCOUNTER — Ambulatory Visit: Payer: Medicare Other | Admitting: Occupational Therapy

## 2013-09-11 ENCOUNTER — Ambulatory Visit: Payer: Medicare Other | Admitting: Physical Therapy

## 2013-09-17 ENCOUNTER — Ambulatory Visit: Payer: Medicare Other | Admitting: Occupational Therapy

## 2013-09-17 ENCOUNTER — Ambulatory Visit: Payer: Medicare Other | Attending: Physical Medicine & Rehabilitation | Admitting: Physical Therapy

## 2013-09-17 DIAGNOSIS — M629 Disorder of muscle, unspecified: Secondary | ICD-10-CM | POA: Insufficient documentation

## 2013-09-17 DIAGNOSIS — R269 Unspecified abnormalities of gait and mobility: Secondary | ICD-10-CM | POA: Insufficient documentation

## 2013-09-17 DIAGNOSIS — I69959 Hemiplegia and hemiparesis following unspecified cerebrovascular disease affecting unspecified side: Secondary | ICD-10-CM | POA: Insufficient documentation

## 2013-09-17 DIAGNOSIS — M2420 Disorder of ligament, unspecified site: Secondary | ICD-10-CM | POA: Insufficient documentation

## 2013-09-17 DIAGNOSIS — M25519 Pain in unspecified shoulder: Secondary | ICD-10-CM | POA: Insufficient documentation

## 2013-09-17 DIAGNOSIS — M6281 Muscle weakness (generalized): Secondary | ICD-10-CM | POA: Insufficient documentation

## 2013-09-17 DIAGNOSIS — R279 Unspecified lack of coordination: Secondary | ICD-10-CM | POA: Insufficient documentation

## 2013-09-19 ENCOUNTER — Ambulatory Visit: Payer: Medicare Other | Admitting: Physical Therapy

## 2013-09-19 ENCOUNTER — Ambulatory Visit: Payer: Medicare Other | Admitting: Occupational Therapy

## 2013-09-24 ENCOUNTER — Ambulatory Visit: Payer: Medicare Other | Admitting: Physical Therapy

## 2013-09-24 ENCOUNTER — Ambulatory Visit: Payer: Medicare Other | Admitting: *Deleted

## 2013-09-25 ENCOUNTER — Encounter: Payer: Medicare Other | Admitting: Occupational Therapy

## 2013-09-25 ENCOUNTER — Ambulatory Visit: Payer: Medicare Other | Admitting: Physical Therapy

## 2013-10-01 ENCOUNTER — Ambulatory Visit: Payer: Medicare Other | Admitting: Occupational Therapy

## 2013-10-01 ENCOUNTER — Ambulatory Visit: Payer: Medicare Other | Admitting: Physical Therapy

## 2013-10-03 ENCOUNTER — Ambulatory Visit: Payer: Medicare Other | Admitting: Occupational Therapy

## 2013-10-03 ENCOUNTER — Ambulatory Visit: Payer: Medicare Other | Admitting: Physical Therapy

## 2013-10-04 ENCOUNTER — Ambulatory Visit: Payer: Medicare Other | Admitting: Physical Medicine & Rehabilitation

## 2013-10-04 ENCOUNTER — Encounter: Payer: Medicare Other | Attending: Physical Medicine & Rehabilitation

## 2013-10-04 DIAGNOSIS — I69959 Hemiplegia and hemiparesis following unspecified cerebrovascular disease affecting unspecified side: Secondary | ICD-10-CM | POA: Insufficient documentation

## 2013-10-07 ENCOUNTER — Ambulatory Visit: Payer: Medicare Other | Admitting: Occupational Therapy

## 2013-10-07 ENCOUNTER — Ambulatory Visit: Payer: Medicare Other | Admitting: Physical Therapy

## 2013-10-08 ENCOUNTER — Encounter: Payer: Medicare Other | Admitting: Internal Medicine

## 2013-10-09 ENCOUNTER — Ambulatory Visit: Payer: Medicare Other | Admitting: Physical Therapy

## 2013-10-09 ENCOUNTER — Ambulatory Visit: Payer: Medicare Other | Admitting: Occupational Therapy

## 2013-10-14 ENCOUNTER — Encounter: Payer: Medicare Other | Attending: Physical Medicine & Rehabilitation

## 2013-10-14 ENCOUNTER — Ambulatory Visit (HOSPITAL_BASED_OUTPATIENT_CLINIC_OR_DEPARTMENT_OTHER): Payer: Medicare Other | Admitting: Physical Medicine & Rehabilitation

## 2013-10-14 ENCOUNTER — Encounter: Payer: Self-pay | Admitting: Physical Medicine & Rehabilitation

## 2013-10-14 VITALS — BP 148/93 | HR 110 | Resp 14 | Ht 66.0 in | Wt 176.0 lb

## 2013-10-14 DIAGNOSIS — M7541 Impingement syndrome of right shoulder: Secondary | ICD-10-CM

## 2013-10-14 DIAGNOSIS — M75100 Unspecified rotator cuff tear or rupture of unspecified shoulder, not specified as traumatic: Secondary | ICD-10-CM

## 2013-10-14 DIAGNOSIS — G8110 Spastic hemiplegia affecting unspecified side: Secondary | ICD-10-CM

## 2013-10-14 DIAGNOSIS — I69959 Hemiplegia and hemiparesis following unspecified cerebrovascular disease affecting unspecified side: Secondary | ICD-10-CM | POA: Insufficient documentation

## 2013-10-14 NOTE — Patient Instructions (Signed)
Right shoulder subacromial cortisone injection  Plan for left tibial nerve block with phenol, this is to help the left toes from dragging

## 2013-10-14 NOTE — Progress Notes (Signed)
Subjective:    Patient ID: [name], male    DOB: [birth year], 52 y.o.   MRN: [id number] Pectoralis100  Biceps100  FCR25  FCU25  FDS25  FDP50  FPL25  Brachiorad 50  HPI Improvement in left upper extremity range of motion and use after Botox injection 09/03/2013. Complaining of right shoulder pain. States that he pushes up with that arm during transfers. Not a good candidate for nonsteroidal anti-inflammatory secondary to recurrent strokes.  No falls some near falls but no injuries. Also wondering if some type of injection may help the left leg. Pain Inventory Average Pain 0 Pain Right Now 0 My pain is no pain  In the last 24 hours, has pain interfered with the following? General activity 0 Relation with others 0 Enjoyment of life 0 What TIME of day is your pain at its worst? no pain Sleep (in general) Good  Pain is worse with: no pain Pain improves with: no pain Relief from Meds: no pain  Mobility walk with assistance use a walker transfers alone  Function I need assistance with the following:  meal prep, household duties and shopping  Neuro/Psych trouble walking spasms  Prior Studies Any changes since last visit?  no  Physicians involved in your care Any changes since last visit?  no   Family History  Problem Relation Age of Onset  . Alcohol abuse Father   . Liver cancer Father   . Diabetes Father    History   Social History  . Marital Status: Married    Spouse Name: N/A    Number of Children: N/A  . Years of Education: N/A   Social History Main Topics  . Smoking status: Never Smoker   . Smokeless tobacco: Never Used  . Alcohol Use: Yes     Comment: ocassional use  . Drug Use: No     Comment: former cocaine abuse, used marjuana 1999  . Sexual Activity: None     Comment: recently finished [employer] associate degree, looking for job, on disability for now   Other Topics Concern  . None   Social History Narrative  . None   Past  Surgical History  Procedure Laterality Date  . Colonoscopy with propofol N/A 05/22/2013    Procedure: COLONOSCOPY WITH PROPOFOL;  Surgeon: Willis Modena, MD;  Location: [facility];  Service: Endoscopy;  Laterality: N/A;   Past Medical History  Diagnosis Date  . Hyperlipidemia     elevated transaminases on statin?-40's (2/06) - Hep B/C negative 4/06; resolution on 4/06 labs; off statin in 2006  . Hypertension     concentric LVH 2d echo 12/05; negative proteinuria 11/00  . 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 Diastolic dysfunction     impaired L ventricle relatation with an EF of 65% and echo in December 2005  . Onychomycosis April 2003    right big toe  . Chest pain     with a negative adenosine cardiolite in December 2006. Dr. Robet Leu   . Tinea versicolor     dermatology referral; secondary to no treatement response to selenium sulfide; currently resolive  .  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 Wanda Plump in 9/05. no BPH at that visit  . Erectile dysfunction   . Arthritis    BP 148/93  Pulse 110  Resp 14  Ht 5\' 6"  (1.676 m)  Wt 176 lb (79.833 kg)  BMI 28.42 kg/m2  SpO2 96%     Review of Systems  Gastrointestinal: Positive for constipation.  Musculoskeletal: Positive for gait problem.       Spasms  Neurological: Positive for weakness.  All other systems reviewed and are negative.       Objective:   Physical Exam  Positive Hawkins Kennedy maneuver on  the right side. Ashworth grade one in the left pectoralis left biceps 0 at the finger flexor and wrist flexor. Clonus at the left greater than right ankle Ambulation is toe drag left lower extremity greater than right lower extremity evidence of clonus during ambulation.      Assessment & Plan:  #1. Right shoulder pain subacromial impingement syndrome by examination Given he has not responded to physical therapy and not a candidate for nonsteroidal swivel injected corticosteroids day  Shoulder injection Right No ultrasound guidance  Indication:Right Shoulder pain not relieved by medication management and other conservative care.  Informed consent was obtained after describing risks and benefits of the procedure with the patient, this includes bleeding, bruising, infection and medication side effects. The patient wishes to proceed and has given written consent. Patient was placed in a seated position. The Right shoulder was marked and prepped with betadine in the subacromial area. A 25-gauge 1-1/2 inch needle was inserted into the subacromial area. After negative draw back for blood, a solution containing 1 mL of 40 mg per ML depomedrol and 3 mL of 1% lidocaine was injected. A band aid was applied. The patient tolerated the procedure well. Post procedure instructions were given.  #2. Left upper extremity spasticity improved after Botox injection recommend repeat in 2-3 months  3. Left greater than right equinovarus spasticity will do left tibial nerve neuro lysis next visit may need to do right tibial nerve at a subsequent visit

## 2013-10-15 ENCOUNTER — Ambulatory Visit: Payer: Medicare Other | Attending: Physical Medicine & Rehabilitation | Admitting: Physical Therapy

## 2013-10-15 ENCOUNTER — Ambulatory Visit: Payer: Medicare Other | Admitting: Occupational Therapy

## 2013-10-15 DIAGNOSIS — M25519 Pain in unspecified shoulder: Secondary | ICD-10-CM | POA: Insufficient documentation

## 2013-10-15 DIAGNOSIS — R269 Unspecified abnormalities of gait and mobility: Secondary | ICD-10-CM | POA: Insufficient documentation

## 2013-10-15 DIAGNOSIS — M2420 Disorder of ligament, unspecified site: Secondary | ICD-10-CM | POA: Insufficient documentation

## 2013-10-15 DIAGNOSIS — M6281 Muscle weakness (generalized): Secondary | ICD-10-CM | POA: Insufficient documentation

## 2013-10-15 DIAGNOSIS — M629 Disorder of muscle, unspecified: Secondary | ICD-10-CM | POA: Insufficient documentation

## 2013-10-15 DIAGNOSIS — R279 Unspecified lack of coordination: Secondary | ICD-10-CM | POA: Insufficient documentation

## 2013-10-15 DIAGNOSIS — I69959 Hemiplegia and hemiparesis following unspecified cerebrovascular disease affecting unspecified side: Secondary | ICD-10-CM | POA: Insufficient documentation

## 2013-10-17 ENCOUNTER — Ambulatory Visit: Payer: Medicare Other | Admitting: Physical Therapy

## 2013-10-17 ENCOUNTER — Ambulatory Visit: Payer: Medicare Other | Admitting: Occupational Therapy

## 2013-11-18 ENCOUNTER — Ambulatory Visit: Payer: Medicare Other | Admitting: Physical Medicine & Rehabilitation

## 2013-11-18 ENCOUNTER — Encounter: Payer: Medicare Other | Attending: Physical Medicine & Rehabilitation

## 2013-11-18 DIAGNOSIS — I69959 Hemiplegia and hemiparesis following unspecified cerebrovascular disease affecting unspecified side: Secondary | ICD-10-CM | POA: Insufficient documentation

## 2013-12-17 ENCOUNTER — Ambulatory Visit (INDEPENDENT_AMBULATORY_CARE_PROVIDER_SITE_OTHER): Payer: Medicare Other | Admitting: Internal Medicine

## 2013-12-17 ENCOUNTER — Encounter: Payer: Self-pay | Admitting: Internal Medicine

## 2013-12-17 VITALS — BP 130/89 | HR 110 | Temp 97.8°F

## 2013-12-17 DIAGNOSIS — Z0000 Encounter for general adult medical examination without abnormal findings: Secondary | ICD-10-CM

## 2013-12-17 DIAGNOSIS — I10 Essential (primary) hypertension: Secondary | ICD-10-CM

## 2013-12-17 DIAGNOSIS — I639 Cerebral infarction, unspecified: Secondary | ICD-10-CM

## 2013-12-17 DIAGNOSIS — M25519 Pain in unspecified shoulder: Secondary | ICD-10-CM

## 2013-12-17 DIAGNOSIS — N184 Chronic kidney disease, stage 4 (severe): Secondary | ICD-10-CM

## 2013-12-17 DIAGNOSIS — M25511 Pain in right shoulder: Secondary | ICD-10-CM

## 2013-12-17 DIAGNOSIS — E785 Hyperlipidemia, unspecified: Secondary | ICD-10-CM

## 2013-12-17 DIAGNOSIS — Z8673 Personal history of transient ischemic attack (TIA), and cerebral infarction without residual deficits: Secondary | ICD-10-CM

## 2013-12-17 LAB — LIPID PANEL
CHOL/HDL RATIO: 3.5 ratio
CHOLESTEROL: 168 mg/dL (ref 0–200)
HDL: 48 mg/dL (ref >39)
LDL Cholesterol: 100 mg/dL — ABNORMAL HIGH (ref 0–99)
TRIGLYCERIDES: 98 mg/dL (ref <150)
VLDL: 20 mg/dL (ref 0–40)

## 2013-12-17 LAB — COMPLETE METABOLIC PANEL WITH GFR
ALT: 47 U/L (ref 0–53)
AST: 25 U/L (ref 0–37)
Albumin: 4.2 g/dL (ref 3.5–5.2)
Alkaline Phosphatase: 83 U/L (ref 39–117)
BUN: 14 mg/dL (ref 6–23)
CO2: 31 mEq/L (ref 19–32)
Calcium: 9.5 mg/dL (ref 8.4–10.5)
Chloride: 101 mEq/L (ref 96–112)
Creat: 1.16 mg/dL (ref 0.50–1.35)
GFR, EST NON AFRICAN AMERICAN: 73 mL/min
GFR, Est African American: 84 mL/min
Glucose, Bld: 117 mg/dL — ABNORMAL HIGH (ref 70–99)
POTASSIUM: 3.6 meq/L (ref 3.5–5.3)
Sodium: 142 mEq/L (ref 135–145)
Total Bilirubin: 0.7 mg/dL (ref 0.2–1.2)
Total Protein: 7.1 g/dL (ref 6.0–8.3)

## 2013-12-17 MED ORDER — DICLOFENAC SODIUM 1 % TD GEL: 2.0000 g | Freq: Four times a day (QID) | TRANSDERMAL | Status: DC

## 2013-12-17 NOTE — Assessment & Plan Note (Signed)
Last LDL was 187 on 06/08/13. Due to elevated liver enzymes, patient was on low dose of Lipitor, 10 mg daily. -will check Lipid profile -continue Lipitor 10 mg daily.

## 2013-12-17 NOTE — Assessment & Plan Note (Addendum)
He has chronic shoulder pain and is responding to naproxen. Because of CKD, patient should not take NSAIDs systemically; because of elevated liver enzyms, he should not take tylenol.  -will d/c naproxen  -will start Local Voltaren gel to avoid systemic side effects.  -check CMP today

## 2013-12-17 NOTE — Assessment & Plan Note (Signed)
BP Readings from Last 3 Encounters:  12/17/13 130/89  10/14/13 148/93  09/03/13 119/80    Lab Results  Component Value Date   NA 140 07/19/2013   K 3.9 07/19/2013   CREATININE 1.37* 07/19/2013    Assessment: Blood pressure control: controlled Progress toward BP goal:  at goal Comments:   Plan: Medications:  continue current medications Educational resources provided: brochure Self management tools provided:   Other plans: Blood pressure is well controlled. Will continue current regimen.

## 2013-12-17 NOTE — Assessment & Plan Note (Signed)
-  pt refused Flu shot, will postpone

## 2013-12-17 NOTE — Progress Notes (Addendum)
Patient ID: [name], male   DOB: 08-28-1961, 53 y.o.   MRN: [id number] Subjective:   Patient ID: [name] male   DOB: August 09, 1961 53 y.o.   MRN: [id number]  CC:   Follow up visit.  HPI:  [name] is a 53 y.o. man with past medical history as outlined below, who presents for a followup visit today  1. 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 He was diagnosed with ischemic stroke on 05/2013. He had 2-D echo on 05/2013 which showed EF 65-70% with grade 1 diastolic dysfunction. His carotid Doppler showed low grade stenoses (less than 40%) in ICA on 7/14. He's currently taking aspirin for secondary prevention. He has weakness in the left arm. He has poor balance. Today he has no difficulty in swallowing. He does not have headache or new episode of weakness, numbness or decreased sensations. He is using walker b/c of poor balance.  2. HTN: Patient is currently taking hydrochlorothiazide, lisinopril and amlodipine. Today his blood pressure is normal, 139/89 mmHg. He does not have chest pain, shortness of breath, palpitation, leg edema.  3. HLD: Patient's LDL was 187 on 06/08/13. His goal for LDL should be less than 100, ideally less than 70. He was taking pravastatin before, which was switched to Lipitor 10 mg daily. His liver function was abnormal. His AST and ALT was 74 and 88, respectively on 06/12/13 and the repeat CMP showed AST 26 and ALT 55, therefore his Lipitor dose was not increased. He has negative hepatitis panel on 01/03/13. His tylenol was discontinued in previous visit on 07/2013.   4. Shoulder pain: He has chronic shoulder pain bilaterally. Currently his left shoulder pain is well controlled. He has moderate shoulder pain over the right shoulder. It is aggravated by extension of his left arm. He does not have swelling or redness over his right shoulder. No fever or chills. Patient is taking over-the-counter naproxen  which partially relieves his shoulder pain.   ROS:  Denies fever, chills, fatigue, headaches,  cough, chest pain, SOB,  abdominal pain,diarrhea, constipation, dysuria, urgency, frequency, hematuria.   Past Medical History  Diagnosis Date  . Hyperlipidemia     elevated transaminases on statin?-40's (2/06) - Hep B/C negative 4/06; resolution on 4/06 labs; off statin in 2006  . Hypertension     concentric LVH 2d echo 12/05; negative proteinuria 11/00  . 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 Diastolic dysfunction     impaired L ventricle relatation with an EF of 65% and echo in December 2005  . Onychomycosis April 2003    right big toe  . Chest pain     with a negative adenosine cardiolite in December 2006. Dr. Liana Crocker   . Tinea versicolor     dermatology referral; secondary to no treatement response to selenium sulfide; currently resolive  . 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 Reece Agar in 9/05. no BPH at that visit  .  Erectile dysfunction   . Arthritis    Current Outpatient Prescriptions  Medication Sig Dispense Refill  . amLODipine (NORVASC) 10 MG tablet Take 1 tablet (10 mg total) by mouth every morning.  90 tablet  3  . aspirin 325 MG EC tablet Take 1 tablet (325 mg total) by mouth daily.  90 tablet    . atorvastatin (LIPITOR) 10 MG tablet Take 1 tablet (10 mg total) by mouth daily at 6 PM.  90 tablet  3  . baclofen (LIORESAL) 20 MG tablet Take 1 tablet (20 mg total)  by mouth 3 (three) times daily.  180 each  3  . hydrochlorothiazide (HYDRODIURIL) 25 MG tablet Take 1 tablet (25 mg total) by mouth every morning.  90 tablet  3  . lisinopril (PRINIVIL,ZESTRIL) 20 MG tablet Take 1 tablet (20 mg total) by mouth daily.  90 tablet  3  . Multiple Vitamins-Minerals (HM MULTIVITAMIN ADULT GUMMY PO) Take 1 tablet by mouth every morning.      . traMADol (ULTRAM) 50 MG tablet Take 1-2 tablets (50-100 mg total) by mouth 2 (two) times daily as needed.  180 tablet  0  . diclofenac sodium (VOLTAREN) 1 % GEL Apply 2 g topically 4 (four) times daily.  100 g  3  . fish oil-omega-3 fatty acids 1000 MG capsule Take 2 g by mouth daily.       No current facility-administered medications for this visit.   Family History  Problem Relation Age of Onset  . Alcohol abuse Father   . Liver cancer Father   . Diabetes Father    History   Social History  . Marital Status: Married    Spouse Name: N/A    Number of Children: N/A  . Years of Education: N/A   Social History Main Topics  . Smoking status: Never Smoker   . Smokeless tobacco: Never Used  . Alcohol Use: Yes     Comment: ocassional use  . Drug Use: No     Comment: former cocaine abuse, used [place]  . Sexual Activity: Not on file     Comment: recently finished [employer] associate degree, looking for job, on disability for now   Other Topics Concern  . Not on file   Social History Narrative  . No narrative on file    Review of Systems: Full 14-point review of systems otherwise negative. See HPI.   Objective:  Physical Exam: Filed Vitals:   12/17/13 1504  BP: 130/89  Pulse: 110  Temp: 97.8 F (36.6 C)  TempSrc: Oral     Gen: pt sitting in bed. Generally restless, anxious HENT: dentition fair Head: Normocephalic.   Eyes: EOM are normal.   Neck: Normal range of motion. Neck supple. No thyromegaly present.   Cardiovascular: Normal rate and regular rhythm. No murmurs Musculoskeletal: there  is tenderness over right shoulder which is worse on the left arm extention. No swelling or redness or warmth over right shoulder. Pulmonary/Chest: Effort normal and breath sounds normal. No respiratory distress. No wheezes, rales, rhonchi Abdominal: Soft. Bowel sounds are normal. He exhibits no distension.  Neurological: oriented X 3,  muscles strength 3/5 in left arm bilaterally, 5/5 in lower extremities bilaterally and in right arm. Has mild muscle rigidity in left arm. Decreased sensation to LT in right hand. DTR 2+ in brachial and knee reflex bilaterally. Skin: Skin is warm and dry.    Assessment & Plan:   Addendum: Patient's creatinine and liver function were normalized.  His LDL is 100, it is okay, but ideally it should be less than 70. I will increase his Lipitor dose to 20 mg daily.  Ivor Costa, MD PGY3, Internal Medicine Teaching Service Pager: [phone]

## 2013-12-17 NOTE — Assessment & Plan Note (Signed)
Patient had hx of ICH and ischemic stroke. He has weakness in the left arm and poor balance. No new episode of weakness, numbness or decreased sensations. He is using walker b/c of poor balance. -will continue aspirin 325 mg daily for secondary prevention -bp control and lipitor for HLD.

## 2013-12-17 NOTE — Patient Instructions (Addendum)
1. Please stop taking naproxen because of your chronic kidney disease. Please use Voltaren gel for your shoulder pain from now.  2. Please take all medications as prescribed.  3. If you have worsening of your symptoms or new symptoms arise, please call the clinic ([phone]), or go to the ER immediately if symptoms are severe.  You have done great job in taking all your medications. I appreciate it very much. Please continue doing that.  Please bring in all your medication bottles with you in next visit.

## 2013-12-18 ENCOUNTER — Other Ambulatory Visit: Payer: Self-pay | Admitting: Internal Medicine

## 2013-12-18 ENCOUNTER — Encounter: Payer: Self-pay | Admitting: Internal Medicine

## 2013-12-18 DIAGNOSIS — E785 Hyperlipidemia, unspecified: Secondary | ICD-10-CM

## 2013-12-18 MED ORDER — ATORVASTATIN CALCIUM 20 MG PO TABS: 10.0000 mg | ORAL_TABLET | Freq: Every day | ORAL | Status: DC

## 2013-12-18 NOTE — Progress Notes (Signed)
INTERNAL MEDICINE TEACHING ATTENDING ADDENDUM - Twyla Dais, DO: I reviewed and discussed at the time of visit with the resident Dr. Niu, the patient's medical history, physical examination, diagnosis and results of tests and treatment and I agree with the patient's care as documented. 

## 2014-01-10 ENCOUNTER — Ambulatory Visit: Payer: Medicare Other | Admitting: Physical Medicine & Rehabilitation

## 2014-01-13 ENCOUNTER — Encounter: Payer: Self-pay | Admitting: Physical Medicine & Rehabilitation

## 2014-01-13 ENCOUNTER — Encounter: Payer: Medicare Other | Attending: Physical Medicine & Rehabilitation

## 2014-01-13 ENCOUNTER — Ambulatory Visit (HOSPITAL_BASED_OUTPATIENT_CLINIC_OR_DEPARTMENT_OTHER): Payer: Medicare Other | Admitting: Physical Medicine & Rehabilitation

## 2014-01-13 VITALS — BP 124/90 | HR 96 | Resp 14 | Ht 66.0 in | Wt 179.0 lb

## 2014-01-13 DIAGNOSIS — Z8673 Personal history of transient ischemic attack (TIA), and cerebral infarction without residual deficits: Secondary | ICD-10-CM | POA: Insufficient documentation

## 2014-01-13 DIAGNOSIS — I10 Essential (primary) hypertension: Secondary | ICD-10-CM | POA: Insufficient documentation

## 2014-01-13 DIAGNOSIS — G8110 Spastic hemiplegia affecting unspecified side: Secondary | ICD-10-CM

## 2014-01-13 DIAGNOSIS — M62838 Other muscle spasm: Secondary | ICD-10-CM | POA: Insufficient documentation

## 2014-01-13 DIAGNOSIS — E785 Hyperlipidemia, unspecified: Secondary | ICD-10-CM | POA: Insufficient documentation

## 2014-01-13 NOTE — Patient Instructions (Signed)
Left tibial nerve block with phenol today  Right tibial nerve block with phenol in one month

## 2014-01-13 NOTE — Progress Notes (Signed)
Subjective:    Patient ID: [name], male    DOB: 20-Jan-1961, 53 y.o.   MRN: [id number]  HPI   Pain Inventory Average Pain 8 Pain Right Now 8 My pain is intermittent and stabbing  In the last 24 hours, has pain interfered with the following? General activity 8 Relation with others 8 Enjoyment of life 8 What TIME of day is your pain at its worst? night Sleep (in general) Fair  Pain is worse with: bending Pain improves with: rest Relief from Meds: 3  Mobility walk with assistance use a walker how many minutes can you walk? 15 ability to climb steps?  yes use a wheelchair  Function disabled: date disabled na  Neuro/Psych weakness numbness tremor  Prior Studies Any changes since last visit?  no  Physicians involved in your care Any changes since last visit?  no   Family History  Problem Relation Age of Onset  . Alcohol abuse Father   . Liver cancer Father   . Diabetes Father    History   Social History  . Marital Status: Married    Spouse Name: N/A    Number of Children: N/A  . Years of Education: N/A   Social History Main Topics  . Smoking status: Never Smoker   . Smokeless tobacco: Never Used  . Alcohol Use: Yes     Comment: ocassional use  . Drug Use: No     Comment: former cocaine abuse, used [place]  . Sexual Activity: None     Comment: recently finished [employer] associate degree, looking for job, on disability for now   Other Topics Concern  . None   Social History Narrative  . None   Past Surgical History  Procedure Laterality Date  . Colonoscopy with propofol N/A 05/22/2013    Procedure: COLONOSCOPY WITH PROPOFOL;  Surgeon: Arta Silence, MD;  Location: [facility];  Service: Endoscopy;  Laterality: N/A;   Past Medical History  Diagnosis Date  . Hyperlipidemia     elevated transaminases on statin?-40's (2/06) - Hep B/C negative 4/06; resolution on 4/06 labs; off statin in 2006  . Hypertension     concentric LVH  2d echo 12/05; negative proteinuria 11/00  . 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 Diastolic dysfunction     impaired L ventricle relatation with an EF of 65% and echo in December 2005  . Onychomycosis April 2003    right big toe  . Chest pain     with a negative adenosine cardiolite in December 2006. Dr. Liana Crocker   . Tinea versicolor     dermatology referral; secondary to no treatement response to selenium sulfide; currently resolive  . 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 Reece Agar in 9/05. no BPH at that visit  . Erectile dysfunction   . Arthritis    BP 124/90  Pulse 96  Resp 14  Ht 5\' 6"  (1.676 m)  Wt 179 lb (81.194 kg)  BMI 28.91 kg/m2  SpO2  98%  Opioid Risk Score:   Fall Risk Score: Moderate Fall Risk (6-13 points) (pt educated on fall risk, brochure given to pt.)    Review of Systems  Genitourinary: Positive for urgency.  Neurological: Positive for tremors, weakness and numbness.  All other systems reviewed and are negative.       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  Informed consent was obtained after describing the risks and benefits of the procedure with the patient this includes bleeding bruising and infection as  well as medication side effects. The patient elected to proceed and has given written consent. Patient placed in a prone position on the exam table. External DC stimulation was applied to the popliteal space using a nerve stimulator. Plantar flexion twitch was obtained. The popliteal region was prepped with Betadine and then entered with a 22-gauge 40 mm needle electrode under electrical stimulation guidance. Plantar flexion which was obtained and confirmed. Then 4 cc of 5% phenol were injected. The patient tolerated procedure well. Post procedure instructions and followup visit were given.

## 2014-02-13 ENCOUNTER — Ambulatory Visit: Payer: Medicare Other | Admitting: Physical Medicine & Rehabilitation

## 2014-02-13 ENCOUNTER — Encounter: Payer: Medicare Other | Attending: Physical Medicine & Rehabilitation

## 2014-02-13 DIAGNOSIS — I69959 Hemiplegia and hemiparesis following unspecified cerebrovascular disease affecting unspecified side: Secondary | ICD-10-CM | POA: Insufficient documentation

## 2014-03-18 ENCOUNTER — Encounter: Payer: Medicare Other | Admitting: Internal Medicine

## 2014-03-19 ENCOUNTER — Encounter: Payer: Medicare Other | Admitting: Internal Medicine

## 2014-03-21 ENCOUNTER — Telehealth: Payer: Self-pay | Admitting: Physical Medicine & Rehabilitation

## 2014-03-21 ENCOUNTER — Other Ambulatory Visit: Payer: Self-pay | Admitting: *Deleted

## 2014-03-21 DIAGNOSIS — E785 Hyperlipidemia, unspecified: Secondary | ICD-10-CM

## 2014-03-21 DIAGNOSIS — I10 Essential (primary) hypertension: Secondary | ICD-10-CM

## 2014-03-21 MED ORDER — LISINOPRIL 20 MG PO TABS: 20.0000 mg | ORAL_TABLET | Freq: Every day | ORAL | Status: DC

## 2014-03-21 MED ORDER — ATORVASTATIN CALCIUM 20 MG PO TABS: 10.0000 mg | ORAL_TABLET | Freq: Every day | ORAL | Status: DC

## 2014-03-21 MED ORDER — HYDROCHLOROTHIAZIDE 25 MG PO TABS
25.0000 mg | ORAL_TABLET | Freq: Every morning | ORAL | Status: DC
Start: 2014-03-21 — End: 2015-05-07

## 2014-03-21 MED ORDER — AMLODIPINE BESYLATE 10 MG PO TABS: 10.0000 mg | ORAL_TABLET | Freq: Every morning | ORAL | Status: DC

## 2014-03-21 NOTE — Telephone Encounter (Signed)
Amlodipine, HCTZ, and Lisinopril rxs faxed to [facility].

## 2014-03-21 NOTE — Telephone Encounter (Signed)
Pt only needs a 30 day supply from this pharmacy.

## 2014-03-21 NOTE — Telephone Encounter (Signed)
Pt needs refill on the following medications: atorvastatin (LIPITOR) 20 MG tablet hydrochlorothiazide (HYDRODIURIL) 25 MG tablet lisinopril (PRINIVIL,ZESTRIL) 20 MG tablet amLODipine (NORVASC) 10 MG tablet   Please call pt when ready on the following [phone] home number and cell number [phone]...   [facility]  Pt advise per shannon to contact to go to pcp or urgent care

## 2014-03-30 NOTE — Progress Notes (Signed)
Physical Therapy  Note  (late entry for missed G Code entry)   Jun 22, 2013 1809  PT G-Codes **NOT FOR INPATIENT CLASS**  Functional Assessment Tool Used Clinical Judgement  Functional Limitation Mobility: Walking and moving around  Mobility: Walking and Moving Around Current Status (A3557) CL  Mobility: Walking and Moving Around Goal Status (D2202) CI   Roney Marion, [place] Pager [phone] Office [phone]

## 2015-02-12 ENCOUNTER — Telehealth: Payer: Self-pay | Admitting: Internal Medicine

## 2015-02-12 NOTE — Telephone Encounter (Signed)
Call to patient to confirm appointment for 02/13/15 at 10:15. No answer on home cell is disconnected

## 2015-02-13 ENCOUNTER — Ambulatory Visit (HOSPITAL_COMMUNITY)
Admission: RE | Admit: 2015-02-13 | Discharge: 2015-02-13 | Disposition: A | Discharge status: Home / Self Care | Payer: Medicare Other | Source: Ambulatory Visit | Attending: Internal Medicine | Admitting: Internal Medicine

## 2015-02-13 ENCOUNTER — Ambulatory Visit (INDEPENDENT_AMBULATORY_CARE_PROVIDER_SITE_OTHER): Payer: Medicare Other | Admitting: Internal Medicine

## 2015-02-13 VITALS — BP 133/99 | HR 87 | Temp 98.0°F | Wt 180.8 lb

## 2015-02-13 DIAGNOSIS — R109 Unspecified abdominal pain: Secondary | ICD-10-CM | POA: Diagnosis present

## 2015-02-13 DIAGNOSIS — M549 Dorsalgia, unspecified: Secondary | ICD-10-CM

## 2015-02-13 DIAGNOSIS — M545 Low back pain, unspecified: Secondary | ICD-10-CM

## 2015-02-13 DIAGNOSIS — G8110 Spastic hemiplegia affecting unspecified side: Secondary | ICD-10-CM | POA: Diagnosis not present

## 2015-02-13 DIAGNOSIS — E785 Hyperlipidemia, unspecified: Secondary | ICD-10-CM

## 2015-02-13 DIAGNOSIS — H5200 Hypermetropia, unspecified eye: Secondary | ICD-10-CM | POA: Insufficient documentation

## 2015-02-13 DIAGNOSIS — M5136 Other intervertebral disc degeneration, lumbar region: Secondary | ICD-10-CM | POA: Diagnosis not present

## 2015-02-13 DIAGNOSIS — G8929 Other chronic pain: Secondary | ICD-10-CM

## 2015-02-13 DIAGNOSIS — K068 Other specified disorders of gingiva and edentulous alveolar ridge: Secondary | ICD-10-CM | POA: Insufficient documentation

## 2015-02-13 DIAGNOSIS — I10 Essential (primary) hypertension: Secondary | ICD-10-CM | POA: Diagnosis not present

## 2015-02-13 DIAGNOSIS — I639 Cerebral infarction, unspecified: Secondary | ICD-10-CM

## 2015-02-13 DIAGNOSIS — R058 Other specified cough: Secondary | ICD-10-CM | POA: Insufficient documentation

## 2015-02-13 DIAGNOSIS — K055 Other periodontal diseases: Secondary | ICD-10-CM

## 2015-02-13 DIAGNOSIS — H5203 Hypermetropia, bilateral: Secondary | ICD-10-CM

## 2015-02-13 DIAGNOSIS — N400 Enlarged prostate without lower urinary tract symptoms: Secondary | ICD-10-CM

## 2015-02-13 DIAGNOSIS — R05 Cough: Secondary | ICD-10-CM

## 2015-02-13 LAB — CBC WITH DIFFERENTIAL/PLATELET
BASOS ABS: 0 10*3/uL (ref 0.0–0.1)
BASOS PCT: 0 % (ref 0–1)
EOS PCT: 5 % (ref 0–5)
Eosinophils Absolute: 0.2 10*3/uL (ref 0.0–0.7)
HEMATOCRIT: 37.7 % — AB (ref 39.0–52.0)
HEMOGLOBIN: 12.7 g/dL — AB (ref 13.0–17.0)
Lymphocytes Relative: 34 % (ref 12–46)
Lymphs Abs: 1 10*3/uL (ref 0.7–4.0)
MCH: 23.3 pg — AB (ref 26.0–34.0)
MCHC: 33.7 g/dL (ref 30.0–36.0)
MCV: 69.3 fL — ABNORMAL LOW (ref 78.0–100.0)
MPV: 9 fL (ref 8.6–12.4)
Monocytes Absolute: 0.4 10*3/uL (ref 0.1–1.0)
Monocytes Relative: 12 % (ref 3–12)
NEUTROS ABS: 1.5 10*3/uL — AB (ref 1.7–7.7)
Neutrophils Relative %: 49 % (ref 43–77)
PLATELETS: 242 10*3/uL (ref 150–400)
RBC: 5.44 MIL/uL (ref 4.22–5.81)
RDW: 16.4 % — ABNORMAL HIGH (ref 11.5–15.5)
WBC: 3 10*3/uL — ABNORMAL LOW (ref 4.0–10.5)

## 2015-02-13 LAB — COMPLETE METABOLIC PANEL WITH GFR
ALBUMIN: 4.2 g/dL (ref 3.5–5.2)
ALT: 25 U/L (ref 0–53)
AST: 21 U/L (ref 0–37)
Alkaline Phosphatase: 76 U/L (ref 39–117)
BUN: 13 mg/dL (ref 6–23)
CALCIUM: 9.6 mg/dL (ref 8.4–10.5)
CO2: 29 meq/L (ref 19–32)
Chloride: 102 mEq/L (ref 96–112)
Creat: 1.08 mg/dL (ref 0.50–1.35)
GFR, EST NON AFRICAN AMERICAN: 78 mL/min
GFR, Est African American: >89 mL/min
GLUCOSE: 89 mg/dL (ref 70–99)
Potassium: 3.6 mEq/L (ref 3.5–5.3)
Sodium: 141 mEq/L (ref 135–145)
Total Bilirubin: 0.5 mg/dL (ref 0.2–1.2)
Total Protein: 7.9 g/dL (ref 6.0–8.3)

## 2015-02-13 LAB — LIPID PANEL
CHOL/HDL RATIO: 4.7 ratio
CHOLESTEROL: 264 mg/dL — AB (ref 0–200)
HDL: 56 mg/dL (ref >=40)
LDL Cholesterol: 190 mg/dL — ABNORMAL HIGH (ref 0–99)
Triglycerides: 88 mg/dL (ref <150)
VLDL: 18 mg/dL (ref 0–40)

## 2015-02-13 MED ORDER — ASPIRIN EC 81 MG PO TBEC
81.0000 mg | DELAYED_RELEASE_TABLET | Freq: Every day | ORAL | Status: AC
Start: 2015-02-13 — End: 2016-02-13

## 2015-02-13 NOTE — Assessment & Plan Note (Signed)
Patient reports worsening of his chronic back pain since he had a fall 1 month ago. Pain is controlled with ibuprofen.  - Check lumbar x-ray today - Recommended to continue ibuprofen 200 mg daily. Can increase if pain continues to be an issue. - Also recommended using heating pads and icy-hot topical for pain relief

## 2015-02-13 NOTE — Assessment & Plan Note (Signed)
Patient has not been taking atorvastatin.  - Check lipid profile today - Restart atorvastatin 10 mg daily for secondary stroke prevention

## 2015-02-13 NOTE — Patient Instructions (Addendum)
It was a pleasure seeing you today, [name].   1. Blood pressure - Continue Amlodipine 10 mg daily - Continue HCTZ 25 mg daily - Continue Lisinopril 20 mg daily  2. Stroke prevention - Start Aspirin 81 mg daily - Start taking Atorvastatin 10 mg daily  3. Cough, likely due to ace inhibitor  - Monitor for now  4. Back pain - X-ray today - Continue ibuprofen 200 mg daily  - Continue voltaren gel - Try heat pads  5. Referral to optometrist for eye sight 6. Referral to dentist for gums  General Instructions:   Please bring your medicines with you each time you come to clinic.  Medicines may include prescription medications, over-the-counter medications, herbal remedies, eye drops, vitamins, or other pills.   Progress Toward Treatment Goals:  Treatment Goal 12/17/2013  Blood pressure at goal    Self Care Goals & Plans:  Self Care Goal 02/13/2015  Manage my medications take my medicines as prescribed; bring my medications to every visit  Monitor my health keep track of my blood pressure; bring my blood pressure log to each visit  Eat healthy foods eat foods that are low in salt; eat baked foods instead of fried foods  Be physically active find an activity I enjoy  Prevent falls use home fall prevention checklist to improve safety; have my vision checked    No flowsheet data found.   Care Management & Community Referrals:  Referral 01/07/2013  Referrals made for care management support none needed

## 2015-02-13 NOTE — Assessment & Plan Note (Signed)
Patient reports a dry cough for the past few months that seems to be associated with food. Concerned that cough may be due to ACE inhibitor. Suggested to patient to switch Lisinopril to Losartan, but patient recently refilled Lisinopril for 90 day supply. Will switch to Losartan once he runs out of Lisinopril since the cough does seem to bother him much. If switching to Losartan does not relieve cough then will consider starting PPI for possible GERD related cough.

## 2015-02-13 NOTE — Assessment & Plan Note (Signed)
Patient with history of multiple strokes. He has not been taking aspirin or atorvastatin for secondary prevention measures. Discussed risks and benefits of taking aspirin and atorvastatin to prevent further strokes. Patient has agreed to restart these medications. - Restart aspirin 81 mg daily - Restart atorvastatin 10 mg daily

## 2015-02-13 NOTE — Progress Notes (Signed)
   Subjective:    Patient ID: [name], male    DOB: 1961-09-11, 54 y.o.   MRN: [id number]  HPI Mr. Brunton is a 54yo man with PMHx of HTN, CVAs (4 total), and HLD who presents today for the following:  1. HTN: BP 133/99 today. Patient takes Amlodipine 10 mg daily, HCTZ 25 mg daily, and Lisinopril 20 mg daily.   2. Back Pain: Patient reports he has a history of chronic back pain. He states his back pain was stable until he had a fall in the bathtub about 1 month ago. He states his back pain has worsened since that time. He has been taking ibuprofen 200 mg daily which helps the pain. He denies any saddle anesthesia and loss of bowel or bladder function. He reports increased difficulty in ambulating. He currently uses a walker and also has a wheelchair that he uses at home.   3. Dry Cough: Patient notes he has had a dry cough for the past few months. He states he noticed it most about 20-30 minutes after eating. He denies food getting caught and denies dysphagia. He denies chest pain or burning sensation. He denies dyspnea, wheezing, and sputum production. He reports overall the cough doesn't bother him much but occasionally can be "annoying."   4. BPH Symptoms: Patient reports hesitancy with urination. He states sometimes it is difficult for him to start a stream. He denies dysuria, hematuria, frequency, fevers/chills. He denies nocturia.   5. Bleeding gums: Patient reports increased bleeding of his gums recently. He denies that he is brushing his teeth too roughly. He states he used toothpaste with hydrogen peroxide that seemed to help the bleeding. He reports he has never seen a dentist.   6. HLD: Last lipid profile on 12/17/13 shows Chol 168, Trigly 98, HDL 48, and LDL 100. Patient states he has not been taking his atorvastatin for several months. He states he heard "bad things" about statins and thinks he doesn't need to take it.   7. Hx CVAs: Patient denies taking his atorvastatin as above and  denies taking an aspirin daily. He denies any changes in speech, sudden eye changes, weakness, and tingling/numbness.   8. Difficulty seeing close up: Patient reports he has been having increased difficulty seeing up close with his current glasses. He states he wants to see an eye doctor to get a new eye glasses prescription.    Review of Systems General: Denies fever, chills, night sweats, changes in weight, changes in appetite HEENT: Denies headaches, ear pain, rhinorrhea, sore throat CV: Denies CP, palpitations, SOB, orthopnea Pulm: Denies SOB, wheezing GI: Denies abdominal pain, nausea, vomiting, diarrhea, constipation, melena, hematochezia GU: Denies dysuria, hematuria, frequency Msk: Denies muscle cramps, joint pains Neuro: Denies new weakness, numbness, tingling Skin: Denies rashes, bruising    Objective:   Physical Exam General: sitting up in chair, NAD HEENT: [place]/AT, EOMI, poor dentition, mucus membranes moist  CV: RRR, no m/g/r. No carotid bruits heard.  Pulm: CTA bilaterally, breaths non-labored  Abd: BS+, soft, non-tender, non-distended  Ext: warm, no edema Neuro: alert and oriented x 3. Strength in left-sided extremities is 3/5 compared to 5/5 in right sided extremities. He requires assistance to get off his sweatshirt. He uses a walker to ambulate.      Assessment & Plan:

## 2015-02-13 NOTE — Assessment & Plan Note (Signed)
Patient reports hesitancy recently. Discussed placing on Flomax but patient does not wish to start this medication at this time.  - Consider adding Flomax in future if continues to be an issue

## 2015-02-13 NOTE — Assessment & Plan Note (Signed)
Patient reports bleeding gums recently. He has never been to a dentist and has poor oral hygiene.  - Check CBC to assess platelets - Refer to dentistry for likely gum disease (gingivitis)

## 2015-02-13 NOTE — Assessment & Plan Note (Signed)
BP Readings from Last 3 Encounters:  02/13/15 133/99  01/13/14 124/90  12/17/13 130/89    Lab Results  Component Value Date   NA 142 12/17/2013   K 3.6 12/17/2013   CREATININE 1.16 12/17/2013    Assessment: Blood pressure control: controlled Progress toward BP goal:  at goal  Plan: Medications:  continue current medications Educational resources provided: brochure Self management tools provided: home blood pressure logbook Other plans:  BP well controlled. Continue Amlodipine 10 mg daily, HCTZ 25 mg daily, and Lisinopril 20 mg daily. Concern that his dry cough may be related to lisinopril. Patient just refilled for 90 day supply of Lisinopril and states cough does not bother him much. Discussed switching to Losartan once his Lisinopril runs out. Will reassess in 3 months.

## 2015-02-13 NOTE — Assessment & Plan Note (Signed)
Patient reports difficulty in seeing up close lately.  - Refer to optometry for new eye glasses prescription

## 2015-02-16 NOTE — Progress Notes (Signed)
Internal Medicine Clinic Attending  Case discussed with Dr. Rivet at the time of the visit.  We reviewed the resident's history and exam and pertinent patient test results.  I agree with the assessment, diagnosis, and plan of care documented in the resident's note.  

## 2015-02-20 ENCOUNTER — Telehealth: Payer: Self-pay | Admitting: *Deleted

## 2015-02-20 NOTE — Telephone Encounter (Signed)
Pt wants the results of labwork done last visit. Hilda Blades Witney Huie RN 02/20/15 3PM

## 2015-02-23 ENCOUNTER — Telehealth: Payer: Self-pay | Admitting: Internal Medicine

## 2015-02-23 NOTE — Telephone Encounter (Signed)
Tried calling patient to discuss lab results but no answer. Will try again tomorrow.

## 2015-02-24 ENCOUNTER — Telehealth: Payer: Self-pay | Admitting: Internal Medicine

## 2015-02-24 NOTE — Telephone Encounter (Signed)
Discussed lab results and lumbar x-ray with patient. Advised him to go up on ibuprofen to 200 mg twice a day as needed for his back pain. Will discuss pain med options at next visit.

## 2015-02-25 NOTE — Telephone Encounter (Signed)
Dr Arcelia Jew talked by phone to pt 02/24/15.

## 2015-04-23 ENCOUNTER — Encounter: Payer: Self-pay | Admitting: *Deleted

## 2015-05-07 ENCOUNTER — Other Ambulatory Visit: Payer: Self-pay | Admitting: Oncology

## 2015-07-01 ENCOUNTER — Encounter: Payer: Self-pay | Admitting: Internal Medicine

## 2015-07-01 ENCOUNTER — Ambulatory Visit (HOSPITAL_COMMUNITY)
Admission: RE | Admit: 2015-07-01 | Discharge: 2015-07-01 | Disposition: A | Discharge status: Home / Self Care | Payer: Medicare Other | Source: Ambulatory Visit | Attending: Internal Medicine | Admitting: Internal Medicine

## 2015-07-01 ENCOUNTER — Ambulatory Visit (INDEPENDENT_AMBULATORY_CARE_PROVIDER_SITE_OTHER): Payer: Medicare Other | Admitting: Internal Medicine

## 2015-07-01 VITALS — BP 114/89 | HR 92 | Temp 98.0°F | Ht 66.0 in | Wt 175.1 lb

## 2015-07-01 DIAGNOSIS — R05 Cough: Secondary | ICD-10-CM

## 2015-07-01 DIAGNOSIS — D509 Iron deficiency anemia, unspecified: Secondary | ICD-10-CM | POA: Insufficient documentation

## 2015-07-01 DIAGNOSIS — R079 Chest pain, unspecified: Secondary | ICD-10-CM | POA: Diagnosis present

## 2015-07-01 DIAGNOSIS — I10 Essential (primary) hypertension: Secondary | ICD-10-CM | POA: Diagnosis not present

## 2015-07-01 DIAGNOSIS — K055 Other periodontal diseases: Secondary | ICD-10-CM

## 2015-07-01 DIAGNOSIS — N400 Enlarged prostate without lower urinary tract symptoms: Secondary | ICD-10-CM

## 2015-07-01 DIAGNOSIS — H5203 Hypermetropia, bilateral: Secondary | ICD-10-CM

## 2015-07-01 DIAGNOSIS — G8929 Other chronic pain: Secondary | ICD-10-CM

## 2015-07-01 DIAGNOSIS — M549 Dorsalgia, unspecified: Secondary | ICD-10-CM | POA: Diagnosis not present

## 2015-07-01 DIAGNOSIS — R718 Other abnormality of red blood cells: Secondary | ICD-10-CM

## 2015-07-01 DIAGNOSIS — K068 Other specified disorders of gingiva and edentulous alveolar ridge: Secondary | ICD-10-CM

## 2015-07-01 DIAGNOSIS — R058 Other specified cough: Secondary | ICD-10-CM

## 2015-07-01 DIAGNOSIS — I639 Cerebral infarction, unspecified: Secondary | ICD-10-CM | POA: Diagnosis not present

## 2015-07-01 MED ORDER — IBUPROFEN 600 MG PO TABS
600.0000 mg | ORAL_TABLET | Freq: Three times a day (TID) | ORAL | Status: DC | PRN
Start: 2015-07-01 — End: 2015-12-11

## 2015-07-01 MED ORDER — NITROGLYCERIN 0.4 MG SL SUBL: 0.4000 mg | SUBLINGUAL_TABLET | SUBLINGUAL | Status: DC | PRN

## 2015-07-01 MED ORDER — CARVEDILOL 3.125 MG PO TABS: 3.1250 mg | ORAL_TABLET | Freq: Two times a day (BID) | ORAL | Status: DC

## 2015-07-01 MED ORDER — FERROUS SULFATE 325 (65 FE) MG PO TABS: 325.0000 mg | ORAL_TABLET | Freq: Every day | ORAL | Status: DC

## 2015-07-01 NOTE — Assessment & Plan Note (Addendum)
Patient describes having left-sided chest pain when working out. He describes pain as mild, left-sided radiating to right side in his upper chest, "tight", and occasionally associated with nausea. He states this occurs about 1-2 times per month. CP lasts about 10 minutes and resolves with rest when it occurs. Pain last occurred about 1 month ago. He denies pain at rest. He denies associated dyspnea and diaphoresis.   His chest pain is certainly concerning for cardiac-related chest pain. He also has risk factors of hypertension and hyperlipidemia. He is a never smoker. An EKG was performed in the office and shows normal sinus rhythm and no changes compared to prior EKGs. Patient states he had a treadmill test several years ago before his strokes. I cannot find this in the system. I think he warrants a referral to cardiology for possible stress testing.   Plan: - Referral to cardiology - Start Coreg 3.125 mg BID - Given rx for nitro 0.4 mg PRN - Instructed to go to ED if chest pain worsens or becomes severe - Continue risk factor modification- BP and hyperlipidemia control - Continue ASA 81 mg daily

## 2015-07-01 NOTE — Assessment & Plan Note (Signed)
BP Readings from Last 3 Encounters:  07/01/15 114/89  02/13/15 133/99  01/13/14 124/90    Lab Results  Component Value Date   NA 141 02/13/2015   K 3.6 02/13/2015   CREATININE 1.08 02/13/2015    Assessment: Blood pressure control: controlled Progress toward BP goal:  at goal Comments: BP well controlled.  Plan: Medications:  Continue Amlodipine 10 mg daily, HCTZ 25 mg daily, and Lisinopril 20 mg daily. Start Coreg 3.125 mg BID for stable angina symptoms (see separate note).  Educational resources provided: brochure (denies)

## 2015-07-01 NOTE — Assessment & Plan Note (Signed)
MCV consistently in 69-70 range. His Hgb remains stable in 12.7-13.6 range, mildly anemic. He has not had extensive work up for the etiology of his microcytosis. He does have a history of alcohol use, which was heavier in the past. He currently drinks 1x/week and 3 beers when he does drink. His last anemia panel was in 2014 and showed Fe 74 (nml), TIBC 282 (nml), and mildly high ferritin at 408. Possible etiologies for his microcytosis include iron deficiency anemia, thalassemia, anemia of chronic disease, alcohol use, or other rare causes. Will check anemia panel today.  - Will wait to start iron until results come back

## 2015-07-01 NOTE — Assessment & Plan Note (Signed)
Patient notes he still has urinary hesitancy occasionally, but that it has improved since last visit. No medication necessary at this time. - Continue to monitor

## 2015-07-01 NOTE — Assessment & Plan Note (Signed)
Patient reports he is taking his ASA 81 mg daily and Atorvastatin 10 mg daily. We discussed the importance of taking these medications to prevent future strokes.

## 2015-07-01 NOTE — Assessment & Plan Note (Signed)
His cough was initially thought to be due to lisinopril. He did not stop this medication last visit as he had already refilled his 3 month prescription. He states the cough has resolved on its own and he is still taking lisinopril. Will readdress if the cough returns.

## 2015-07-01 NOTE — Patient Instructions (Signed)
-   Start taking ibuprofen 600 mg up to 3 times daily for your back pain - Start taking Coreg 3.125 mg twice daily - If you have chest pain, try taking a nitroglycerin tablet to see if that helps your pain - Go to the emergency room if your chest pain persists or worsens - Continue taking your blood pressure medicines, aspirin, and lipitor as these are important for preventing future strokes - Blood work today  General Instructions:   Please bring your medicines with you each time you come to clinic.  Medicines may include prescription medications, over-the-counter medications, herbal remedies, eye drops, vitamins, or other pills.   Progress Toward Treatment Goals:  Treatment Goal 02/13/2015  Blood pressure at goal  Prevent falls deteriorated    Self Care Goals & Plans:  Self Care Goal 07/01/2015  Manage my medications take my medicines as prescribed; bring my medications to every visit; refill my medications on time  Monitor my health -  Eat healthy foods drink diet soda or water instead of juice or soda; eat more vegetables; eat foods that are low in salt; eat baked foods instead of fried foods; eat fruit for snacks and desserts  Be physically active -  Prevent falls -    No flowsheet data found.   Care Management & Community Referrals:  Referral 01/07/2013  Referrals made for care management support none needed

## 2015-07-01 NOTE — Progress Notes (Signed)
   Subjective:    Patient ID: [name], male    DOB: 10-14-1961, 54 y.o.   MRN: [id number]  HPI Mr. Montesinos is a 54yo man with PMHx of HTN, multiple CVAs, and hyperlipidemia who presents today for a routine visit.  Please refer to A&P documentation.    Review of Systems General: Denies fever, chills, night sweats, changes in weight, changes in appetite HEENT: Denies headaches, ear pain, changes in vision, rhinorrhea, sore throat CV: Reports chest pain- about 1x/month. Denies palpitations, SOB, orthopnea Pulm: Denies SOB, cough, wheezing GI: Reports occasional nausea with chest pain. Denies abdominal pain, vomiting, diarrhea, constipation, melena, hematochezia GU: Reports hesitancy occasionally, but improved. Denies dysuria, hematuria, frequency Msk: Denies muscle cramps, joint pains Neuro: Reports weakness, numbness, tingling- from prior strokes Skin: Denies rashes, bruising    Objective:   Physical Exam General: alert, sitting up in chair, NAD HEENT: [place]/AT, EOMI, sclera anicteric, mucus membranes moist CV: RRR, no m/g/r Pulm: CTA bilaterally, breaths non-labored Abd: BS+, soft, non-tender Ext: warm, no peripheral edema Neuro: alert and oriented x 3, strength in left-sided extremities is 3/5 compared to 5/5 in right sided extremities. Uses walker to ambulate    Assessment & Plan:  Refer to A&P documentation.

## 2015-07-01 NOTE — Assessment & Plan Note (Signed)
Patient reports his gum bleeding has improved since last visit. He notes occasional bleeding when brushing his teeth. He has not had a teeth cleaning in over 2 years. He has not made an appointment with the dentist yet. His platelet count at last visit was normal at 242,000. Likely a result of poor dental hygiene.  - Recommended to schedule appointment with dentist

## 2015-07-01 NOTE — Assessment & Plan Note (Signed)
Patient saw the optometrist and now has a new pair of glasses. His vision has improved.

## 2015-07-01 NOTE — Assessment & Plan Note (Signed)
Patient describes back pain as ranging from 5-8/10 in severity, constant, located in his mid lower back, and worse when lying down. He denies radiation to his legs. He is taking ibuprofen 400 mg daily without much relief. He notes the voltaren gel does provide relief, but that it is expensive ($75). He is not interested in PT as he works out on his own and has been frustrated when working with PT in the past.  - Increase ibuprofen to 600 mg TID PRN - Discussed trying steroid injection if increased ibuprofen does not provide relief

## 2015-07-02 LAB — ANEMIA PROFILE B
Basophils Absolute: 0 10*3/uL (ref 0.0–0.2)
Basos: 0 %
EOS (ABSOLUTE): 0.2 10*3/uL (ref 0.0–0.4)
Eos: 6 %
FOLATE: 13 ng/mL (ref >3.0)
Ferritin: 391 ng/mL (ref 30–400)
Hematocrit: 39.2 % (ref 37.5–51.0)
Hemoglobin: 12.6 g/dL (ref 12.6–17.7)
Immature Grans (Abs): 0 10*3/uL (ref 0.0–0.1)
Immature Granulocytes: 0 %
Iron Saturation: 25 % (ref 15–55)
Iron: 65 ug/dL (ref 38–169)
LYMPHS ABS: 1 10*3/uL (ref 0.7–3.1)
Lymphs: 28 %
MCH: 23.3 pg — AB (ref 26.6–33.0)
MCHC: 32.1 g/dL (ref 31.5–35.7)
MCV: 73 fL — AB (ref 79–97)
MONOS ABS: 0.4 10*3/uL (ref 0.1–0.9)
Monocytes: 10 %
NEUTROS ABS: 2 10*3/uL (ref 1.4–7.0)
Neutrophils: 56 %
PLATELETS: 214 10*3/uL (ref 150–379)
RBC: 5.41 x10E6/uL (ref 4.14–5.80)
RDW: 16.5 % — AB (ref 12.3–15.4)
Retic Ct Pct: 0.6 % (ref 0.6–2.6)
Total Iron Binding Capacity: 261 ug/dL (ref 250–450)
UIBC: 196 ug/dL (ref 111–343)
VITAMIN B 12: 468 pg/mL (ref 211–946)
WBC: 3.7 10*3/uL (ref 3.4–10.8)

## 2015-07-06 NOTE — Progress Notes (Signed)
Internal Medicine Clinic Attending  Case discussed with Dr. Rivet soon after the resident saw the patient.  We reviewed the resident's history and exam and pertinent patient test results.  I agree with the assessment, diagnosis, and plan of care documented in the resident's note.  

## 2015-07-23 ENCOUNTER — Encounter: Payer: Self-pay | Admitting: Cardiovascular Disease

## 2015-07-23 ENCOUNTER — Ambulatory Visit (INDEPENDENT_AMBULATORY_CARE_PROVIDER_SITE_OTHER): Payer: Medicare Other | Admitting: Cardiovascular Disease

## 2015-07-23 VITALS — BP 130/98 | HR 90 | Ht 66.0 in | Wt 176.0 lb

## 2015-07-23 DIAGNOSIS — R079 Chest pain, unspecified: Secondary | ICD-10-CM | POA: Diagnosis not present

## 2015-07-23 DIAGNOSIS — I10 Essential (primary) hypertension: Secondary | ICD-10-CM | POA: Diagnosis not present

## 2015-07-23 NOTE — Progress Notes (Signed)
Cardiology Office Note   Date:  07/23/2015   ID:  [name], DOB [birth year], MRN [id number]  PCP:  Albin Felling, MD  Cardiologist:   Sharol Harness, MD   Chief Complaint  Patient presents with  . New Evaluation  . Chest Pain    when stress on right side   . Edema    feet      History of Present Illness: [name] is a 54 y.o. male with hypertension, hyperlipidemia, ICH,  and chronic diastolic dysfunction who presents for an evaluation of chest pain.  He reports intermittent episodes of chest pain that occur during times of stress.  He episodes last for 10-15 minutes and are not associated with shortness of breath, nausea, diaphoresis, palpitations, lightheadedness or dizziness.  They resolve when he is able to calm himself down and relax.  [name] has suffered 4 strokes and has residual L sided weakness.  He is unable to get much exercise, but he does walk with a walker.  He denies CP or SOB with walking.  He does get fatigued easily.  This has not changed much recently.  [name] was evaluated by his PCP on 8/17 and reported L sided chest pain.  At that time ECG was unremarkable.  He was started on carvedilol and received a prescription for nitroglycerin prn.  He was referred to cardiology for evaluation.   Past Medical History  Diagnosis Date  . Hyperlipidemia     elevated transaminases on statin?-40's (2/06) - Hep B/C negative 4/06; resolution on 4/06 labs; off statin in 2006  . Hypertension     concentric LVH 2d echo 12/05; negative proteinuria 11/00  . 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 Diastolic dysfunction     impaired L ventricle relatation with an EF of 65% and echo in December 2005  . Onychomycosis April 2003    right big toe  . Chest pain     with a negative adenosine cardiolite in December 2006. Dr. Liana Crocker   . Tinea versicolor     dermatology referral; secondary to no treatement response to selenium sulfide; currently resolive  . 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 Reece Agar in 9/05. no BPH at that visit  . Erectile dysfunction   . Arthritis     Past Surgical History  Procedure Laterality Date  . Colonoscopy with propofol N/A 05/22/2013    Procedure: COLONOSCOPY WITH PROPOFOL;  Surgeon: Arta Silence, MD;  Location: [facility];  Service: Endoscopy;  Laterality: N/A;     Current Outpatient Prescriptions  Medication Sig Dispense Refill  . amLODipine (NORVASC) 10 MG tablet TAKE 1 TABLET BY MOUTH EVERY MORNING 90 tablet 3  . aspirin EC 81 MG tablet Take 1 tablet (81 mg total) by mouth daily. 150 tablet 2  . atorvastatin (LIPITOR) 20 MG tablet TAKE 1/2 TABLET BY MOUTH DAILY AT 6:00PM 90 tablet 3  . diclofenac sodium (VOLTAREN) 1 % GEL Apply 2 g topically 4 (four)  times daily. 100 g 3  . hydrochlorothiazide (HYDRODIURIL) 25 MG tablet TAKE 1 TABLET BY MOUTH EVERY MORNING 90 tablet 3  . ibuprofen (ADVIL,MOTRIN) 600 MG tablet Take 1 tablet (600 mg total) by mouth every 8 (eight) hours as needed for moderate pain. 30 tablet 1  . lisinopril (PRINIVIL,ZESTRIL) 20 MG tablet TAKE 1 TABLET BY MOUTH DAILY 90 tablet 3  . Multiple Vitamins-Minerals (HM MULTIVITAMIN ADULT GUMMY PO) Take 1 tablet by mouth every morning.    . carvedilol (COREG) 3.125 MG tablet Take 1 tablet (3.125 mg total) by mouth 2 (two) times daily. (Patient not taking: Reported on 07/23/2015) 60 tablet 3  . nitroGLYCERIN (NITROSTAT) 0.4 MG SL tablet  Place 1 tablet (0.4 mg total) under the tongue every 5 (five) minutes as needed for chest pain. (Patient not taking: Reported on 07/23/2015) 100 tablet 1   No current facility-administered medications for this visit.    Allergies:   Review of patient's allergies indicates no known allergies.    Social History:  The patient  reports that he has never smoked. He has never used smokeless tobacco. He reports that he drinks alcohol. He reports that he does not use illicit drugs.   Family History:  The patient's family history includes Alcohol abuse in his father; Diabetes in his father; Liver cancer in his father.    ROS:  Please see the history of present illness.   Otherwise, review of systems are positive for sinus congestion, L LE edema, and back pain..   All other systems are reviewed and negative.    PHYSICAL EXAM: VS:  BP 130/98 mmHg  Pulse 90  Ht 5\' 6"  (1.676 m)  Wt 79.833 kg (176 lb)  BMI 28.42 kg/m2 , BMI Body mass index is 28.42 kg/(m^2). GENERAL:  Chronically ill-appearing HEENT:  Pupils equal round and reactive, fundi not visualized, oral mucosa unremarkable NECK:  No jugular venous distention, waveform within normal limits, carotid upstroke brisk and symmetric, no bruits, no thyromegaly LYMPHATICS:  No cervical adenopathy LUNGS:  Clear to auscultation bilaterally HEART:  RRR.  PMI not displaced or sustained,S1 and S2 within normal limits, no S3, no S4, no clicks, no rubs, no murmurs ABD:  Flat, positive bowel sounds normal in frequency in pitch, no bruits, no rebound, no guarding, no midline pulsatile mass, no hepatomegaly, no splenomegaly EXT:  2 plus pulses throughout, Trace edema on the LLE.  No cyanosis no clubbing.  L sided weakness. SKIN:  No rashes no nodules NEURO:  Cranial nerves II through XII grossly intact, motor grossly intact throughout PSYCH:  Cognitively intact, oriented to person place and time    EKG:  EKG is ordered today. The ekg ordered today  demonstrates sinus rhythm at 93 bpm.     Recent Labs: 02/13/2015: ALT 25; BUN 13; Creat 1.08; Hemoglobin 12.7*; Platelets 242; Potassium 3.6; Sodium 141    Lipid Panel    Component Value Date/Time   CHOL 264* 02/13/2015 1117   TRIG 88 02/13/2015 1117   HDL 56 02/13/2015 1117   CHOLHDL 4.7 02/13/2015 1117   VLDL 18 02/13/2015 1117   LDLCALC 190* 02/13/2015 1117      Wt Readings from Last 3 Encounters:  07/23/15 79.833 kg (176 lb)  07/01/15 79.425 kg (175 lb 1.6 oz)  02/13/15 82.01 kg (180 lb 12.8 oz)      Other studies Reviewed: Additional studies/ records that were reviewed today include:  Review of the above records demonstrates:  Please see elsewhere in the  note.     ASSESSMENT AND PLAN:  # Chest pain: [name] chest pain is concerning for ischemia.  He does not exert himself enough to tell if it is exertional in nature.  He is unable to walk on a stress test, so we will obtain a Lexiscan Myoview to evaluate for ischemia.  He understands that if his CP recurs and does not resolve within 15 minutes, he should seek emergency care.  He has nitroglycerin to use as needed.  Continue aspirin and beta blocker.  # Hypertension: BP poorly-controlled.  [name] does not want to titrate his anti-hypertensives.  He would prefer natural alternatives.  We discussed the fact that he will likely need to take medications for the rest of his life, and to avoid future strokes or cardiovascular events, I recommend increasing his lisinopril.  He does not wish to do so at this time.  Continue amlodipine, carvedilol and lisinopril.   Current medicines are reviewed at length with the patient today.  The patient does not have concerns regarding medicines.  The following changes have been made:  no change  Labs/ tests ordered today include:   Orders Placed This Encounter  Procedures  . Myocardial Perfusion Imaging  . EKG 12-Lead     Disposition:   FU with Dr. Jonelle Sidle C. [place] prn.     Signed, Sharol Harness, MD  07/23/2015 11:11 PM    [place]

## 2015-07-23 NOTE — Patient Instructions (Signed)
Your physician has requested that you have a lexiscan myoview. For further information please visit [email or web address]. Please follow instruction sheet, as given.  .Dr Oval Linsey recommends that you follow-up with her as needed.

## 2015-07-28 ENCOUNTER — Telehealth (HOSPITAL_COMMUNITY): Payer: Self-pay

## 2015-07-28 NOTE — Telephone Encounter (Signed)
Encounter complete. 

## 2015-07-29 ENCOUNTER — Telehealth (HOSPITAL_COMMUNITY): Payer: Self-pay

## 2015-07-29 NOTE — Telephone Encounter (Signed)
Encounter complete. 

## 2015-07-30 ENCOUNTER — Ambulatory Visit (HOSPITAL_COMMUNITY)
Admission: RE | Admit: 2015-07-30 | Discharge: 2015-07-30 | Disposition: A | Discharge status: Home / Self Care | Payer: Medicare Other | Source: Ambulatory Visit | Attending: Cardiology | Admitting: Cardiology

## 2015-07-30 DIAGNOSIS — E785 Hyperlipidemia, unspecified: Secondary | ICD-10-CM | POA: Diagnosis not present

## 2015-07-30 DIAGNOSIS — R079 Chest pain, unspecified: Secondary | ICD-10-CM | POA: Diagnosis not present

## 2015-07-30 DIAGNOSIS — F17200 Nicotine dependence, unspecified, uncomplicated: Secondary | ICD-10-CM | POA: Diagnosis not present

## 2015-07-30 DIAGNOSIS — I10 Essential (primary) hypertension: Secondary | ICD-10-CM | POA: Diagnosis not present

## 2015-07-30 MED ORDER — REGADENOSON 0.4 MG/5ML IV SOLN
0.4000 mg | Freq: Once | INTRAVENOUS | Status: AC
  Administered 2015-07-30: 0.4 mg via INTRAVENOUS

## 2015-07-30 MED ORDER — TECHNETIUM TC 99M SESTAMIBI GENERIC - CARDIOLITE
31.8000 | Freq: Once | INTRAVENOUS | Status: AC | PRN
  Administered 2015-07-30: 31.8 via INTRAVENOUS

## 2015-07-30 MED ORDER — TECHNETIUM TC 99M SESTAMIBI GENERIC - CARDIOLITE
10.5000 | Freq: Once | INTRAVENOUS | Status: AC | PRN
  Administered 2015-07-30: 10.5 via INTRAVENOUS

## 2015-07-31 LAB — MYOCARDIAL PERFUSION IMAGING
CHL CUP RESTING HR STRESS: 69 {beats}/min
LVDIAVOL: 82 mL
LVSYSVOL: 29 mL
NUC STRESS TID: 1.33
Peak HR: 105 {beats}/min
SDS: 0
SRS: 2
SSS: 2

## 2015-08-04 ENCOUNTER — Telehealth: Payer: Self-pay | Admitting: *Deleted

## 2015-08-04 NOTE — Telephone Encounter (Signed)
-----   Message from Skeet Latch, MD sent at 07/31/2015  4:53 PM EDT ----- Normal stress.  Can stop nitroglycerin.

## 2015-08-04 NOTE — Telephone Encounter (Signed)
Spoke to patient. Result given . Verbalized understanding  

## 2015-12-11 ENCOUNTER — Other Ambulatory Visit: Payer: Self-pay | Admitting: Internal Medicine

## 2016-06-24 IMAGING — NM NM MISC PROCEDURE
6 series · 36 of 36 positions shown · non-contrast
Comparison: none

[Series 1: wbr_r-proj_st wbr rest · 6.40mm/px · 6 of 64 frames shown]
[frame 6/64]
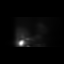
[frame 16/64]
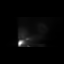
[frame 27/64]
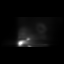
[frame 38/64]
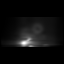
[frame 48/64]
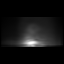
[frame 59/64]
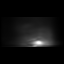

[Series 1: wbr rest · 6.40mm/px · 6 of 64 frames shown]
[frame 6/64]
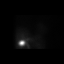
[frame 16/64]
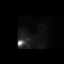
[frame 27/64]
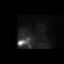
[frame 38/64]
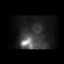
[frame 48/64]
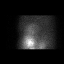
[frame 59/64]
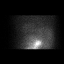

[Series 2: wbr_s-proj_st wbr stress-gsp · 6.40mm/px · 6 of 512 frames shown]
[frame 43/512]
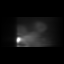
[frame 128/512]
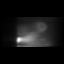
[frame 214/512]
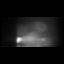
[frame 299/512]
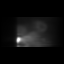
[frame 384/512]
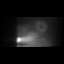
[frame 470/512]
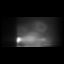

[Series 2: wbr stress-gsp · 6.40mm/px · 6 of 512 frames shown]
[frame 43/512]
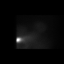
[frame 128/512]
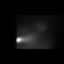
[frame 214/512]
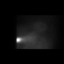
[frame 299/512]
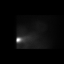
[frame 384/512]
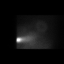
[frame 470/512]
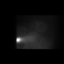

[Series 3: wbr stress-sum-em · 6.40mm/px · 6 of 64 frames shown]
[frame 6/64]
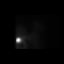
[frame 16/64]
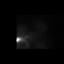
[frame 27/64]
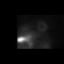
[frame 38/64]
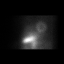
[frame 48/64]
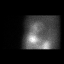
[frame 59/64]
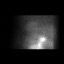

[Series 3: wbr_s-proj_st wbr stress-sum-em · 6.40mm/px · 6 of 64 frames shown]
[frame 6/64]
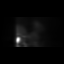
[frame 16/64]
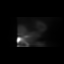
[frame 27/64]
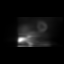
[frame 38/64]
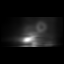
[frame 48/64]
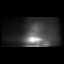
[frame 59/64]
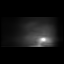

[36 of 36 positions shown; findings below may reference images not displayed]

Canned report from images found in remote index.

Refer to host system for actual result text.

## 2016-07-16 ENCOUNTER — Other Ambulatory Visit: Payer: Self-pay | Admitting: Internal Medicine

## 2016-07-19 NOTE — Telephone Encounter (Signed)
Patient needs appointment please.  °

## 2016-10-04 ENCOUNTER — Encounter: Payer: Self-pay | Admitting: Internal Medicine

## 2016-10-04 ENCOUNTER — Ambulatory Visit (INDEPENDENT_AMBULATORY_CARE_PROVIDER_SITE_OTHER): Payer: Medicare Other | Admitting: Internal Medicine

## 2016-10-04 VITALS — BP 142/98 | HR 90 | Temp 98.2°F | Wt 177.6 lb

## 2016-10-04 DIAGNOSIS — N529 Male erectile dysfunction, unspecified: Secondary | ICD-10-CM | POA: Insufficient documentation

## 2016-10-04 DIAGNOSIS — R079 Chest pain, unspecified: Secondary | ICD-10-CM

## 2016-10-04 DIAGNOSIS — Z7982 Long term (current) use of aspirin: Secondary | ICD-10-CM

## 2016-10-04 DIAGNOSIS — R0789 Other chest pain: Secondary | ICD-10-CM | POA: Diagnosis not present

## 2016-10-04 DIAGNOSIS — E785 Hyperlipidemia, unspecified: Secondary | ICD-10-CM | POA: Diagnosis not present

## 2016-10-04 DIAGNOSIS — I69354 Hemiplegia and hemiparesis following cerebral infarction affecting left non-dominant side: Secondary | ICD-10-CM | POA: Diagnosis not present

## 2016-10-04 DIAGNOSIS — Z0000 Encounter for general adult medical examination without abnormal findings: Secondary | ICD-10-CM

## 2016-10-04 DIAGNOSIS — M79605 Pain in left leg: Secondary | ICD-10-CM

## 2016-10-04 DIAGNOSIS — M545 Low back pain: Secondary | ICD-10-CM

## 2016-10-04 DIAGNOSIS — I10 Essential (primary) hypertension: Secondary | ICD-10-CM

## 2016-10-04 DIAGNOSIS — Z79899 Other long term (current) drug therapy: Secondary | ICD-10-CM

## 2016-10-04 DIAGNOSIS — I639 Cerebral infarction, unspecified: Secondary | ICD-10-CM

## 2016-10-04 MED ORDER — TADALAFIL 5 MG PO TABS: 5.0000 mg | ORAL_TABLET | Freq: Every day | ORAL | 0 refills | Status: DC | PRN

## 2016-10-04 MED ORDER — GABAPENTIN 100 MG PO CAPS: 100.0000 mg | ORAL_CAPSULE | Freq: Three times a day (TID) | ORAL | 1 refills | Status: DC

## 2016-10-04 MED ORDER — ASPIRIN 81 MG PO TABS: 81.0000 mg | ORAL_TABLET | Freq: Every day | ORAL | 2 refills | Status: AC

## 2016-10-04 NOTE — Assessment & Plan Note (Signed)
Stable.  Continue Lipitor 10 mg daily. 

## 2016-10-04 NOTE — Progress Notes (Signed)
CC: Hypertension  HPI:  [name] is a 55 y.o. man with PMHx as noted below who presents today for follow up of his hypertension.   HTN: BP 142/98 today. He is taking Amlodipine 10 mg daily, HCTZ 25 mg daily, and Lisinopril 20 mg daily. Reports he stopped taking Carvedilol several months ago.   Hx CVA: Reports he is taking ASA 81 mg daily and Lipitor 10 mg daily. Denies any changes in speech, changes in vision, new weakness, numbness/tingling. He does have some residual left upper extremity weakness from prior stroke.  Hyperlipidemia: Stable. He is taking Lipitor 10 mg daily. Denies any muscle cramps.   Low Back Pain: Reports pain in his mid to lower back that is unchanged from prior. He states pain is constant, 8/10 at its worst, sharp, and radiates down both legs. He reports it is difficult to sleep at night. Denies loss of bladder/bowel control, saddle anesthesia. He has been taking ibuprofen 200 mg 1-2 times per week as needed when pain is severe.   Erectile Dysfunction: Reports difficulty maintaining an erection. This has been an issue for several years for him. He thinks it is related to his medications he is on. He is interested in trying Cialis as Viagra did not work for him in the past.   Exertional Chest Pain: He was experiencing exertional chest pain at his last visit. He denies any chest pain, SOB, palpitations since that time. He saw Cardiology last year and underwent Myoview stress test that was low risk. He has not had to use nitroglycerin.   Past Medical History:  Diagnosis Date  . Arthritis   . Chest pain    with a negative adenosine cardiolite in December 2006. Dr. Liana Crocker   . Cocaine abuse    last used in 2000; currently in remission; no IVDU to my knowledge  . Diastolic dysfunction    impaired L ventricle relatation with an EF of 65% and echo in December 2005  . Erectile dysfunction   . Hyperlipidemia    elevated transaminases on statin?-40's (2/06) - Hep  B/C negative 4/06; resolution on 4/06 labs; off statin in 2006  . Hypertension    concentric LVH 2d echo 12/05; negative proteinuria 11/00  . 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 Onychomycosis April 2003   right big toe  . Prostate disorder    prostate irregularity with obstructive voiding symptoms (PSA 0.67); -likely related to overstimulation of alpha receptors at bladder neck; seen by Dr. Reece Agar in 9/05. no BPH at that visit  . Tinea versicolor    dermatology referral; secondary to no treatement response to selenium sulfide; currently resolive    Review of Systems:   General: Denies fever, chills, night sweats, changes in weight, changes in appetite HEENT: Denies headaches, ear pain, changes in vision, rhinorrhea, sore throat CV: Denies CP, palpitations, SOB, orthopnea Pulm: Denies SOB, cough, wheezing GI: Denies abdominal pain, nausea, vomiting, diarrhea, constipation, melena, hematochezia GU: Denies dysuria, hematuria, frequency Msk: Denies muscle cramps Neuro: See HPI Skin: Denies rashes, bruising Psych: Denies depression, anxiety, hallucinations  Physical Exam:  Vitals:   10/04/16 1429  BP: (!) 142/98  Pulse: 90  Temp: 98.2 F (36.8 C)  TempSrc: Oral  SpO2:  99%  Weight: 177 lb 9.6 oz (80.6 kg)   General: alert, cooperative, NAD HEENT: [place]/AT, EOMI, sclera anicteric, mucus membranes moist CV: RRR, no m/g/r Pulm: CTA bilaterally, breaths non-labored  Back: No tenderness to palpation along spinous processes Ext: warm, no peripheral edema  Neuro: alert and oriented x 3. Strength 5/5 in RUE and bilateral lower extremities and 4+/5 in LUE.   Assessment & Plan:   See  Encounters Tab for problem based charting.  Patient discussed with Dr. Daryll Drown

## 2016-10-04 NOTE — Assessment & Plan Note (Signed)
Very close to goal at 142/98. Will continue current regimen of Amlodipine 10 mg daily, HCTZ 25 mg daily, and Lisinopril 20 mg daily. Advised him to monitor his sodium intake and decrease this if possible. Can consider increasing Lisinopril if BP elevated at next visit.

## 2016-10-04 NOTE — Assessment & Plan Note (Signed)
Reports his low back pain is radiating to both legs. He had a lumbar x-ray in 2016 which showed mild degenerative disc space narrowing at L4-5. No warning symptoms. He is able to ambulate without difficulty. Will have him try Gabapentin 100 mg TID. Also advised to take ibuprofen 400 mg TID if needed. Can consider repeat imaging if pain worsening.

## 2016-10-04 NOTE — Patient Instructions (Addendum)
General Instructions: - Try to decrease salt intake as much as possible to keep blood pressure in good control - Start taking Gabapentin 100 mg three times daily for your back pain - Can also take ibuprofen 400 mg three times daily as needed - Please bring back stool cards to next visit   Please bring your medicines with you each time you come to clinic.  Medicines may include prescription medications, over-the-counter medications, herbal remedies, eye drops, vitamins, or other pills.   Progress Toward Treatment Goals:  Treatment Goal 07/01/2015  Blood pressure at goal  Prevent falls -    Self Care Goals & Plans:  Self Care Goal 10/04/2016  Manage my medications take my medicines as prescribed; bring my medications to every visit; refill my medications on time  Monitor my health keep track of my blood pressure  Eat healthy foods eat more vegetables; eat foods that are low in salt; eat baked foods instead of fried foods  Be physically active find an activity I enjoy  Prevent falls have my vision checked; wear appropriate shoes    No flowsheet data found.   Care Management & Community Referrals:  Referral 01/07/2013  Referrals made for care management support none needed

## 2016-10-04 NOTE — Assessment & Plan Note (Signed)
Declined flu shot today. Will do stool cards and bring to next visit.

## 2016-10-04 NOTE — Assessment & Plan Note (Signed)
Has not had any further episodes of chest pain. Myoview low risk in 2016. Will continue to monitor for any recurrence of symptoms.

## 2016-10-04 NOTE — Assessment & Plan Note (Signed)
Reports this has been an issue for him for several years. Unclear etiology. I reviewed his medications but do not see any that would potentially cause erectile dysfunction. He has not been on a beta blocker for several months. Will have him try Cialis 5 mg PRN. We discussed the side effects of this medication and if he maintains an erection longer than 4 hours he should go to the emergency room.

## 2016-10-04 NOTE — Assessment & Plan Note (Signed)
Stable. Continue ASA and Lipitor.

## 2016-10-10 NOTE — Progress Notes (Signed)
Internal Medicine Clinic Attending  Case discussed with Dr. Rivet soon after the resident saw the patient.  We reviewed the resident's history and exam and pertinent patient test results.  I agree with the assessment, diagnosis, and plan of care documented in the resident's note.  

## 2016-12-09 ENCOUNTER — Other Ambulatory Visit: Payer: Self-pay | Admitting: Internal Medicine

## 2017-03-28 ENCOUNTER — Encounter: Payer: Medicare Other | Admitting: Internal Medicine

## 2017-04-04 ENCOUNTER — Encounter: Payer: Medicare Other | Admitting: Internal Medicine

## 2017-05-29 ENCOUNTER — Ambulatory Visit (INDEPENDENT_AMBULATORY_CARE_PROVIDER_SITE_OTHER): Payer: Medicare Other | Admitting: Internal Medicine

## 2017-05-29 VITALS — BP 147/111 | HR 95 | Wt 181.9 lb

## 2017-05-29 DIAGNOSIS — Z791 Long term (current) use of non-steroidal anti-inflammatories (NSAID): Secondary | ICD-10-CM | POA: Diagnosis not present

## 2017-05-29 DIAGNOSIS — E785 Hyperlipidemia, unspecified: Secondary | ICD-10-CM | POA: Diagnosis not present

## 2017-05-29 DIAGNOSIS — I10 Essential (primary) hypertension: Secondary | ICD-10-CM

## 2017-05-29 DIAGNOSIS — Z0000 Encounter for general adult medical examination without abnormal findings: Secondary | ICD-10-CM

## 2017-05-29 DIAGNOSIS — M545 Low back pain, unspecified: Secondary | ICD-10-CM

## 2017-05-29 DIAGNOSIS — Z79899 Other long term (current) drug therapy: Secondary | ICD-10-CM | POA: Diagnosis not present

## 2017-05-29 DIAGNOSIS — Z1211 Encounter for screening for malignant neoplasm of colon: Secondary | ICD-10-CM

## 2017-05-29 DIAGNOSIS — M5416 Radiculopathy, lumbar region: Secondary | ICD-10-CM | POA: Diagnosis not present

## 2017-05-29 MED ORDER — LISINOPRIL 20 MG PO TABS: 20.0000 mg | ORAL_TABLET | Freq: Every day | ORAL | 1 refills | Status: AC

## 2017-05-29 MED ORDER — MELOXICAM 15 MG PO TABS: 15.0000 mg | ORAL_TABLET | Freq: Every day | ORAL | 3 refills | Status: DC

## 2017-05-29 MED ORDER — ATORVASTATIN CALCIUM 20 MG PO TABS: 20.0000 mg | ORAL_TABLET | Freq: Every day | ORAL | 1 refills | Status: DC

## 2017-05-29 MED ORDER — AMLODIPINE BESYLATE 10 MG PO TABS: 10.0000 mg | ORAL_TABLET | Freq: Every morning | ORAL | 1 refills | Status: DC

## 2017-05-29 MED ORDER — HYDROCHLOROTHIAZIDE 25 MG PO TABS: 25.0000 mg | ORAL_TABLET | Freq: Every morning | ORAL | 1 refills | Status: DC

## 2017-05-29 MED ORDER — LISINOPRIL 20 MG PO TABS: 20.0000 mg | ORAL_TABLET | Freq: Every day | ORAL | 1 refills | Status: DC

## 2017-05-29 MED ORDER — AMLODIPINE BESYLATE 10 MG PO TABS: 10.0000 mg | ORAL_TABLET | Freq: Every morning | ORAL | 1 refills | Status: AC

## 2017-05-29 NOTE — Assessment & Plan Note (Addendum)
The pt was previously close to goal BP on a regimen of amlodipine, hctz, and lisinopril. He has not been taking lisinopril for the past 2-3 months. Today, BP is 147/111. The pt is initially expresses hesitancy about his medications and adverse effects, however, after discussion on the risks/benefits, especially in preventing another stroke, he agreed with the importance of his regimen. Will check labs today  -Continue previous regimen: Amlodipine 10 mg daily, HCTZ 25 mg daily, Lisinopril 20 mg daily  -CMP -F/u in 1 month after restarting of multiple meds

## 2017-05-29 NOTE — Assessment & Plan Note (Addendum)
Was previously stable and tolerating Atorvastatin, reorder medication. Also, recheck lipid panel today, last >21yr ago and CMP.

## 2017-05-29 NOTE — Assessment & Plan Note (Addendum)
He has a history of low back pain with degenerative changes on XR. 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 The deficits may also be causing compensation in other areas to cause pain. He has had relief with acetaminophen. Today, he declines a referral to physical therapy given his experiences in therapy in the past.   -Meloxican 15 mg daily -Continue OTC acetaminophen

## 2017-05-29 NOTE — Assessment & Plan Note (Signed)
FiT test given, last in 2016 was negative

## 2017-05-29 NOTE — Patient Instructions (Signed)
Nice to meet you [name].   We've refilled your medications to help prevent further strokes or other health problems. We also gave a prescription for Meloxicam which is a similar but stronger, and longer acting pain medicine than ibuprofen. You can also take Acetaminophen or Tylenol- up to 1000 mg every 8 hours, or 650 mg every 6 hours. Also, take your FiT test home for colon cancer screening.   I'll see you back on 8/21 to see how you're doing and we can go over blood work at that appointment as well.

## 2017-05-29 NOTE — Progress Notes (Signed)
   CC: Medication management  HPI:  [name] is a 56 y.o. M with past medical history as described below who presents for medication management and routine check up.   [name] states he ran out of some of his medicines about 2 months ago, but has still been taking HCTZ and Amlodipine. He expresses apprehension about taking many medicines as he has read about adverse effects people have experienced, especially cholesterol medicines. He has continued to feel lower back pain and extremity pain, worse in mornings, described as a tightness. Also notes LE edema in L leg, wears compression stocking and elevates for relief. He has tried gabapentin, ibuprofen with minimal relief. He has seen physical therapy in the past but has been frustrated in previous attempts because he feels he can progress faster than they allow. He notes frustration with his functional status but is trying to improve his health by eating healthy and working out as he is able.     Past Medical History:  Diagnosis Date  . Arthritis   . Chest pain    with a negative adenosine cardiolite in December 2006. Dr. Liana Crocker   . Cocaine abuse    last used in 2000; currently in remission; no IVDU to my knowledge  . Diastolic dysfunction    impaired L ventricle relatation with an EF of 65% and echo in December 2005  . Erectile dysfunction   . Hyperlipidemia    elevated transaminases on statin?-40's (2/06) - Hep B/C negative 4/06; resolution on 4/06 labs; off statin in 2006  . Hypertension    concentric LVH 2d echo 12/05; negative proteinuria 11/00  . 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 Onychomycosis April 2003   right big toe   . Prostate disorder    prostate irregularity with obstructive voiding symptoms (PSA 0.67); -likely related to overstimulation of alpha receptors at bladder neck; seen by Dr. Reece Agar in 9/05. no BPH at that visit  . Tinea versicolor    dermatology referral; secondary to no treatement response to selenium sulfide; currently resolive   Review of Systems:  Review of Systems  Constitutional: Negative for fever.  Musculoskeletal: Positive for myalgias.  Psychiatric/Behavioral: Negative for depression and suicidal ideas.     Physical Exam:  Vitals:   05/29/17 1413  BP: (!) 147/111  Pulse: 95  SpO2: 98%  Weight: 181 lb 14.4 oz (82.5 kg)   Physical Exam  Constitutional: He is oriented to person, place, and time. He appears well-developed.  HENT:  Head: Normocephalic and atraumatic.  Cardiovascular: Normal rate and regular rhythm.   Pulmonary/Chest: Effort normal and breath sounds normal.  Neurological: He is alert and oriented to person, place, and time.  Stiffness and decreased strength in L upper and lower extremities, full strength on R side    Assessment & Plan:   See Encounters Tab for problem based charting.  Patient seen with Dr. Daryll Drown

## 2017-05-31 LAB — LIPID PANEL
Chol/HDL Ratio: 4.9 ratio (ref 0.0–5.0)
Cholesterol, Total: 295 mg/dL — ABNORMAL HIGH (ref 100–199)
HDL: 60 mg/dL (ref >39)
LDL CALC: 224 mg/dL — AB (ref 0–99)
Triglycerides: 57 mg/dL (ref 0–149)
VLDL CHOLESTEROL CAL: 11 mg/dL (ref 5–40)

## 2017-05-31 LAB — CMP14 + ANION GAP
ALBUMIN: 4.4 g/dL (ref 3.5–5.5)
ALT: 33 IU/L (ref 0–44)
ANION GAP: 17 mmol/L (ref 10.0–18.0)
AST: 26 IU/L (ref 0–40)
Albumin/Globulin Ratio: 1.3 (ref 1.2–2.2)
Alkaline Phosphatase: 89 IU/L (ref 39–117)
BUN / CREAT RATIO: 13 (ref 9–20)
BUN: 16 mg/dL (ref 6–24)
Bilirubin Total: 0.4 mg/dL (ref 0.0–1.2)
CALCIUM: 9.6 mg/dL (ref 8.7–10.2)
CO2: 24 mmol/L (ref 20–29)
CREATININE: 1.21 mg/dL (ref 0.76–1.27)
Chloride: 101 mmol/L (ref 96–106)
GFR calc Af Amer: 77 mL/min/{1.73_m2} (ref >59)
GFR, EST NON AFRICAN AMERICAN: 67 mL/min/{1.73_m2} (ref >59)
GLOBULIN, TOTAL: 3.4 g/dL (ref 1.5–4.5)
Glucose: 95 mg/dL (ref 65–99)
Potassium: 3.6 mmol/L (ref 3.5–5.2)
SODIUM: 142 mmol/L (ref 134–144)
TOTAL PROTEIN: 7.8 g/dL (ref 6.0–8.5)

## 2017-05-31 NOTE — Progress Notes (Signed)
Internal Medicine Clinic Attending  I saw and evaluated the patient.  I personally confirmed the key portions of the history and exam documented by Dr. Harden and I reviewed pertinent patient test results.  The assessment, diagnosis, and plan were formulated together and I agree with the documentation in the resident's note.  

## 2017-06-01 LAB — FECAL OCCULT BLOOD, IMMUNOCHEMICAL: Fecal Occult Bld: NEGATIVE

## 2017-07-04 ENCOUNTER — Ambulatory Visit (INDEPENDENT_AMBULATORY_CARE_PROVIDER_SITE_OTHER): Payer: Medicare Other | Admitting: Internal Medicine

## 2017-07-04 ENCOUNTER — Encounter: Payer: Self-pay | Admitting: Internal Medicine

## 2017-07-04 VITALS — BP 142/111 | HR 96 | Temp 98.2°F | Wt 182.0 lb

## 2017-07-04 DIAGNOSIS — I10 Essential (primary) hypertension: Secondary | ICD-10-CM | POA: Diagnosis present

## 2017-07-04 DIAGNOSIS — N529 Male erectile dysfunction, unspecified: Secondary | ICD-10-CM | POA: Diagnosis not present

## 2017-07-04 DIAGNOSIS — Z79899 Other long term (current) drug therapy: Secondary | ICD-10-CM | POA: Diagnosis not present

## 2017-07-04 DIAGNOSIS — E785 Hyperlipidemia, unspecified: Secondary | ICD-10-CM

## 2017-07-04 MED ORDER — TADALAFIL 10 MG PO TABS: 10.0000 mg | ORAL_TABLET | Freq: Every day | ORAL | 5 refills | Status: DC | PRN

## 2017-07-04 MED ORDER — TADALAFIL 10 MG PO TABS
10.0000 mg | ORAL_TABLET | Freq: Every day | ORAL | 5 refills | Status: DC | PRN
Start: 2017-07-04 — End: 2017-11-26

## 2017-07-04 NOTE — Assessment & Plan Note (Signed)
BP today is 132/106, further elevated on recheck. He has resumed his regimen of Amlodipine 10 mg, HCTZ 25 mg and Lisinopril 20 mg and reports adherence despite his continued dislike of medications and the perceived risk of side effects. I extensively counseled the patient that, while medications do have side effects, these are uncommon and monitored for; we also discussed that the benefits of preventing recurrent CVAs, MI and other adverse health effects greatly outweigh the small risk of side effects in his case. He is agreeable to continuing his current regimen, and was instructed to use a BP cuff at home and record BP to give a more accurate picture of his blood pressure.   -Resume current regimen -BMP after restarting meds last visit

## 2017-07-04 NOTE — Assessment & Plan Note (Signed)
Lipid panel one month ago revealed Total chol 295, LDL 224, HDL 60. Pt currently on Atorvastatin 20 mg and would benefit from an increased dose. However, after the discussions regarding his medications (detailed in HTN A&P), will hold off on increasing his statin dose.

## 2017-07-04 NOTE — Patient Instructions (Addendum)
Good to see you again today [name]   Your blood pressure still looks elevated today but it may be because of rushing to get to the appointment and being in the clinic. Try to pick up a blood pressure cuff to take your pressure at home. Then write down the values and bring them in to your next appointment so we can compare. I think the medicines are very important to prevent any further strokes or other health issues. We'll see you back in about 2-3 months to see how your blood pressure is doing.    Your cholesterol was very high when we checked last time so I'm glad we started the cholesterol medicine. This is another important factor in preventing other health events like stroke or heart attack.   I sent a refill of the Cialis 10 mg to your pharmacy. Take them at least 30 minutes before a sexual encounter

## 2017-07-04 NOTE — Progress Notes (Signed)
   CC: Hypertension follow up  HPI:  [name] is a 56 y.o. M with past medical history as described below who presents to the clinic for follow up of his hypertension after recent restarting of his regimen.   HTN: He was seen one month ago after he reported stopping his medications for the preceding two months. He was restarted on his anti-HTN regimen and statin at that time. Since then he reports medication adherence. However, he continues to express that he dislikes taking medications, researches their side effects, and feels he can recover with more natural means.   ED: He also notes recurrent issues with erectile dysfunction since running out of his previously prescribed cialis. A dose of 10 mg was effective, he has used vacuum devices in the past without success.    Past Medical History:  Diagnosis Date  . Arthritis   . Chest pain    with a negative adenosine cardiolite in December 2006. Dr. Liana Crocker   . Cocaine abuse    last used in 2000; currently in remission; no IVDU to my knowledge  . Diastolic dysfunction    impaired L ventricle relatation with an EF of 65% and echo in December 2005  . Erectile dysfunction   . Hyperlipidemia    elevated transaminases on statin?-40's (2/06) - Hep B/C negative 4/06; resolution on 4/06 labs; off statin in 2006  . Hypertension    concentric LVH 2d echo 12/05; negative proteinuria 11/00  . 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 Onychomycosis April 2003   right big toe  . Prostate disorder    prostate irregularity with obstructive voiding symptoms (PSA 0.67); -likely related to overstimulation of alpha receptors at bladder neck; seen by Dr.  Reece Agar in 9/05. no BPH at that visit  . Tinea versicolor    dermatology referral; secondary to no treatement response to selenium sulfide; currently resolive   Review of Systems:  Negative except as noted in the HPI.   Physical Exam:  Vitals:   07/04/17 1556  BP: (!) 132/106  Pulse: 96  Temp: 98.2 F (36.8 C)  TempSrc: Oral  SpO2: 99%  Weight: 182 lb (82.6 kg)   Physical Exam  Constitutional: He is oriented to person, place, and time. He appears well-developed.  Male using walker, with apparent left sided extremity deficits  Cardiovascular: Normal rate.   Pulmonary/Chest: Effort normal. No respiratory distress.  Neurological: He is alert and oriented to person, place, and time.  Skin: Skin is warm and dry.     Assessment & Plan:   See Encounters Tab for problem based charting.  Patient seen with [name]

## 2017-07-04 NOTE — Assessment & Plan Note (Signed)
Ongoing issue which was previously well treated with Cialis 10 mg. He has also tried vacuum type pumps without significant improvement.   -Cialis 10 mg PRN #10

## 2017-07-05 LAB — BMP8+ANION GAP
ANION GAP: 16 mmol/L (ref 10.0–18.0)
BUN/Creatinine Ratio: 14 (ref 9–20)
BUN: 16 mg/dL (ref 6–24)
CO2: 22 mmol/L (ref 20–29)
Calcium: 9.5 mg/dL (ref 8.7–10.2)
Chloride: 100 mmol/L (ref 96–106)
Creatinine, Ser: 1.15 mg/dL (ref 0.76–1.27)
GFR calc Af Amer: 82 mL/min/{1.73_m2} (ref >59)
GFR calc non Af Amer: 71 mL/min/{1.73_m2} (ref >59)
Glucose: 85 mg/dL (ref 65–99)
POTASSIUM: 4 mmol/L (ref 3.5–5.2)
SODIUM: 138 mmol/L (ref 134–144)

## 2017-07-07 NOTE — Progress Notes (Signed)
Internal Medicine Clinic Attending  I saw and evaluated the patient.  I personally confirmed the key portions of the history and exam documented by Dr. Harden and I reviewed pertinent patient test results.  The assessment, diagnosis, and plan were formulated together and I agree with the documentation in the resident's note.  

## 2017-11-25 ENCOUNTER — Emergency Department (HOSPITAL_COMMUNITY): Payer: Medicare HMO

## 2017-11-25 ENCOUNTER — Other Ambulatory Visit: Payer: Self-pay

## 2017-11-25 ENCOUNTER — Encounter (HOSPITAL_COMMUNITY): Payer: Self-pay

## 2017-11-25 ENCOUNTER — Inpatient Hospital Stay (HOSPITAL_COMMUNITY)
Admission: EM | Admit: 2017-11-25 | Discharge: 2017-12-22 | DRG: 004 | Disposition: A | Discharge status: Skilled Nursing Facility | Payer: Medicare HMO | Attending: Internal Medicine | Admitting: Internal Medicine

## 2017-11-25 DIAGNOSIS — Z79899 Other long term (current) drug therapy: Secondary | ICD-10-CM

## 2017-11-25 DIAGNOSIS — J9811 Atelectasis: Secondary | ICD-10-CM

## 2017-11-25 DIAGNOSIS — E876 Hypokalemia: Secondary | ICD-10-CM | POA: Diagnosis present

## 2017-11-25 DIAGNOSIS — R29717 NIHSS score 17: Secondary | ICD-10-CM | POA: Diagnosis not present

## 2017-11-25 DIAGNOSIS — I639 Cerebral infarction, unspecified: Secondary | ICD-10-CM | POA: Diagnosis not present

## 2017-11-25 DIAGNOSIS — I5032 Chronic diastolic (congestive) heart failure: Secondary | ICD-10-CM | POA: Diagnosis present

## 2017-11-25 DIAGNOSIS — J9621 Acute and chronic respiratory failure with hypoxia: Secondary | ICD-10-CM | POA: Diagnosis present

## 2017-11-25 DIAGNOSIS — M1990 Unspecified osteoarthritis, unspecified site: Secondary | ICD-10-CM | POA: Diagnosis present

## 2017-11-25 DIAGNOSIS — R471 Dysarthria and anarthria: Secondary | ICD-10-CM | POA: Diagnosis present

## 2017-11-25 DIAGNOSIS — Z978 Presence of other specified devices: Secondary | ICD-10-CM | POA: Diagnosis not present

## 2017-11-25 DIAGNOSIS — Z8673 Personal history of transient ischemic attack (TIA), and cerebral infarction without residual deficits: Secondary | ICD-10-CM

## 2017-11-25 DIAGNOSIS — E870 Hyperosmolality and hypernatremia: Secondary | ICD-10-CM | POA: Diagnosis present

## 2017-11-25 DIAGNOSIS — R479 Unspecified speech disturbances: Secondary | ICD-10-CM | POA: Diagnosis not present

## 2017-11-25 DIAGNOSIS — N529 Male erectile dysfunction, unspecified: Secondary | ICD-10-CM | POA: Diagnosis present

## 2017-11-25 DIAGNOSIS — R29713 NIHSS score 13: Secondary | ICD-10-CM | POA: Diagnosis not present

## 2017-11-25 DIAGNOSIS — R29708 NIHSS score 8: Secondary | ICD-10-CM | POA: Diagnosis not present

## 2017-11-25 DIAGNOSIS — N139 Obstructive and reflux uropathy, unspecified: Secondary | ICD-10-CM | POA: Diagnosis present

## 2017-11-25 DIAGNOSIS — R509 Fever, unspecified: Secondary | ICD-10-CM

## 2017-11-25 DIAGNOSIS — D509 Iron deficiency anemia, unspecified: Secondary | ICD-10-CM

## 2017-11-25 DIAGNOSIS — Z4659 Encounter for fitting and adjustment of other gastrointestinal appliance and device: Secondary | ICD-10-CM

## 2017-11-25 DIAGNOSIS — J384 Edema of larynx: Secondary | ICD-10-CM | POA: Diagnosis present

## 2017-11-25 DIAGNOSIS — G8191 Hemiplegia, unspecified affecting right dominant side: Secondary | ICD-10-CM | POA: Diagnosis present

## 2017-11-25 DIAGNOSIS — E785 Hyperlipidemia, unspecified: Secondary | ICD-10-CM | POA: Diagnosis present

## 2017-11-25 DIAGNOSIS — G1222 Progressive bulbar palsy: Secondary | ICD-10-CM | POA: Clinically undetermined

## 2017-11-25 DIAGNOSIS — I7774 Dissection of vertebral artery: Secondary | ICD-10-CM | POA: Diagnosis present

## 2017-11-25 DIAGNOSIS — E1151 Type 2 diabetes mellitus with diabetic peripheral angiopathy without gangrene: Secondary | ICD-10-CM | POA: Diagnosis present

## 2017-11-25 DIAGNOSIS — D72819 Decreased white blood cell count, unspecified: Secondary | ICD-10-CM | POA: Diagnosis present

## 2017-11-25 DIAGNOSIS — E860 Dehydration: Secondary | ICD-10-CM | POA: Diagnosis present

## 2017-11-25 DIAGNOSIS — R1312 Dysphagia, oropharyngeal phase: Secondary | ICD-10-CM | POA: Diagnosis present

## 2017-11-25 DIAGNOSIS — Z8679 Personal history of other diseases of the circulatory system: Secondary | ICD-10-CM

## 2017-11-25 DIAGNOSIS — I69354 Hemiplegia and hemiparesis following cerebral infarction affecting left non-dominant side: Secondary | ICD-10-CM

## 2017-11-25 DIAGNOSIS — Z811 Family history of alcohol abuse and dependence: Secondary | ICD-10-CM

## 2017-11-25 DIAGNOSIS — Z9911 Dependence on respirator [ventilator] status: Secondary | ICD-10-CM

## 2017-11-25 DIAGNOSIS — N429 Disorder of prostate, unspecified: Secondary | ICD-10-CM | POA: Diagnosis present

## 2017-11-25 DIAGNOSIS — I130 Hypertensive heart and chronic kidney disease with heart failure and stage 1 through stage 4 chronic kidney disease, or unspecified chronic kidney disease: Secondary | ICD-10-CM | POA: Diagnosis present

## 2017-11-25 DIAGNOSIS — Z01818 Encounter for other preprocedural examination: Secondary | ICD-10-CM

## 2017-11-25 DIAGNOSIS — J9601 Acute respiratory failure with hypoxia: Secondary | ICD-10-CM

## 2017-11-25 DIAGNOSIS — E1122 Type 2 diabetes mellitus with diabetic chronic kidney disease: Secondary | ICD-10-CM | POA: Diagnosis present

## 2017-11-25 DIAGNOSIS — R000 Tachycardia, unspecified: Secondary | ICD-10-CM | POA: Diagnosis not present

## 2017-11-25 DIAGNOSIS — R4701 Aphasia: Secondary | ICD-10-CM | POA: Diagnosis not present

## 2017-11-25 DIAGNOSIS — F1411 Cocaine abuse, in remission: Secondary | ICD-10-CM | POA: Diagnosis present

## 2017-11-25 DIAGNOSIS — R29701 NIHSS score 1: Secondary | ICD-10-CM | POA: Diagnosis present

## 2017-11-25 DIAGNOSIS — X58XXXA Exposure to other specified factors, initial encounter: Secondary | ICD-10-CM | POA: Diagnosis not present

## 2017-11-25 DIAGNOSIS — J690 Pneumonitis due to inhalation of food and vomit: Secondary | ICD-10-CM | POA: Diagnosis present

## 2017-11-25 DIAGNOSIS — R0902 Hypoxemia: Secondary | ICD-10-CM

## 2017-11-25 DIAGNOSIS — T17908A Unspecified foreign body in respiratory tract, part unspecified causing other injury, initial encounter: Secondary | ICD-10-CM

## 2017-11-25 DIAGNOSIS — J9382 Other air leak: Secondary | ICD-10-CM | POA: Diagnosis not present

## 2017-11-25 DIAGNOSIS — Z7982 Long term (current) use of aspirin: Secondary | ICD-10-CM

## 2017-11-25 DIAGNOSIS — R9401 Abnormal electroencephalogram [EEG]: Secondary | ICD-10-CM | POA: Diagnosis present

## 2017-11-25 DIAGNOSIS — Z930 Tracheostomy status: Secondary | ICD-10-CM

## 2017-11-25 DIAGNOSIS — F482 Pseudobulbar affect: Secondary | ICD-10-CM | POA: Diagnosis present

## 2017-11-25 DIAGNOSIS — T17990A Other foreign object in respiratory tract, part unspecified in causing asphyxiation, initial encounter: Secondary | ICD-10-CM | POA: Diagnosis not present

## 2017-11-25 DIAGNOSIS — N182 Chronic kidney disease, stage 2 (mild): Secondary | ICD-10-CM | POA: Diagnosis present

## 2017-11-25 DIAGNOSIS — Q382 Macroglossia: Secondary | ICD-10-CM

## 2017-11-25 DIAGNOSIS — N179 Acute kidney failure, unspecified: Secondary | ICD-10-CM | POA: Diagnosis present

## 2017-11-25 DIAGNOSIS — I10 Essential (primary) hypertension: Secondary | ICD-10-CM | POA: Diagnosis present

## 2017-11-25 DIAGNOSIS — Z833 Family history of diabetes mellitus: Secondary | ICD-10-CM

## 2017-11-25 DIAGNOSIS — E871 Hypo-osmolality and hyponatremia: Secondary | ICD-10-CM | POA: Diagnosis not present

## 2017-11-25 DIAGNOSIS — J9589 Other postprocedural complications and disorders of respiratory system, not elsewhere classified: Secondary | ICD-10-CM

## 2017-11-25 LAB — PROTIME-INR
INR: 0.98
Prothrombin Time: 12.9 seconds (ref 11.4–15.2)

## 2017-11-25 LAB — CBC
HEMATOCRIT: 41.9 % (ref 39.0–52.0)
HEMOGLOBIN: 14.1 g/dL (ref 13.0–17.0)
MCH: 23.6 pg — ABNORMAL LOW (ref 26.0–34.0)
MCHC: 33.7 g/dL (ref 30.0–36.0)
MCV: 70.1 fL — AB (ref 78.0–100.0)
Platelets: 242 10*3/uL (ref 150–400)
RBC: 5.98 MIL/uL — ABNORMAL HIGH (ref 4.22–5.81)
RDW: 14.2 % (ref 11.5–15.5)
WBC: 3.8 10*3/uL — ABNORMAL LOW (ref 4.0–10.5)

## 2017-11-25 LAB — I-STAT CHEM 8, ED
BUN: 14 mg/dL (ref 6–20)
CHLORIDE: 102 mmol/L (ref 101–111)
CREATININE: 1.3 mg/dL — AB (ref 0.61–1.24)
Calcium, Ion: 1.12 mmol/L — ABNORMAL LOW (ref 1.15–1.40)
GLUCOSE: 99 mg/dL (ref 65–99)
HEMATOCRIT: 44 % (ref 39.0–52.0)
Hemoglobin: 15 g/dL (ref 13.0–17.0)
POTASSIUM: 3.3 mmol/L — AB (ref 3.5–5.1)
Sodium: 142 mmol/L (ref 135–145)
TCO2: 25 mmol/L (ref 22–32)

## 2017-11-25 LAB — I-STAT TROPONIN, ED: Troponin i, poc: 0.01 ng/mL (ref 0.00–0.08)

## 2017-11-25 LAB — CBG MONITORING, ED: Glucose-Capillary: 87 mg/dL (ref 65–99)

## 2017-11-25 LAB — APTT: aPTT: 30 seconds (ref 24–36)

## 2017-11-25 MED ORDER — IOPAMIDOL (ISOVUE-370) INJECTION 76%
INTRAVENOUS | Status: AC
  Administered 2017-11-25: 100 mL
  Filled 2017-11-25: qty 100

## 2017-11-25 NOTE — ED Triage Notes (Signed)
Onset 8pm expressive aphasia that lasted 5-10 minutes, occurred again when pt was in parking lot of ED lasting 5-10 minutes.  No new weakness or other symptoms.

## 2017-11-25 NOTE — Consult Note (Addendum)
Neurology Consultation  Reason for Consult: Aphasia Referring Physician: Dr. Billy Fischer  CC: Speech problems  History is obtained from: Patient, wife  HPI: [name] is a 57 y.o. male with a history of multiple prior strokes with residual weakness which is worse on the left, polysubstance abuse in the past, hypertension, hyperlipidemia, presented to the emergency room when his wife noted that he has been having trouble with his speech. She said that they were doing the chores this morning, went to [employer], at around 8 PM when she was cooking and came back to check on him in the living room, it looked like he could not talk.  He stared at her and was not able to talk.  This is not like his normal self.  This episode lasted about 5-10 minutes and resolved on its own.  Then the similar kind of episode happened again.  She drove him to the emergency room and he was brought into the emergency room via the triage.  A code stroke was paged at the triage because of word finding difficulty and complaints of aphasia. On my examination, he did not have any word finding difficulty. He was taken for a noncontrast CT of the head, which on initial review was unremarkable, formal read suggests a possible new left lacunar infarct. CT angiogram of the head and neck was performed, that did not show any evidence of large vessel occlusion.  There is no obvious abnormality on the rapid perfusion but the regular CT perfusion shows possible with decreased T-max on the left.   LKW: Noted to have problems at 8 PM.  Last known normal probably 7:30 PM. tpa given?: no, symptoms resolved, fluctuating symptoms Premorbid modified Rankin scale (mRS): 2  Review of Systems  Constitutional: Negative for chills and fever.  HENT: Negative for hearing loss and tinnitus.   Eyes: Negative for blurred vision and double vision.  Respiratory: Negative for cough and hemoptysis.   Cardiovascular: Negative for chest pain and  palpitations.  Gastrointestinal: Negative for heartburn and nausea.  Genitourinary: Negative for dysuria and urgency.  Musculoskeletal: Negative for myalgias and neck pain.  Skin: Negative for itching and rash.  Neurological: Negative for dizziness, tingling and headaches.  Endo/Heme/Allergies: Negative for environmental allergies. Does not bruise/bleed easily.  Psychiatric/Behavioral: Negative for depression and suicidal ideas.     Past Medical History:  Diagnosis Date  . Arthritis   . Chest pain    with a negative adenosine cardiolite in December 2006. Dr. Liana Crocker   . Cocaine abuse ([place])    last used in 2000; currently in remission; no IVDU to my knowledge  . Diastolic dysfunction    impaired L ventricle relatation with an EF of 65% and echo in December 2005  . Erectile dysfunction   . Hyperlipidemia    elevated transaminases on statin?-40's (2/06) - Hep B/C negative 4/06; resolution on 4/06 labs; off statin in 2006  . Hypertension    concentric LVH 2d echo 12/05; negative proteinuria 11/00  . 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 Onychomycosis April 2003   right big toe  . Prostate disorder    prostate irregularity with obstructive voiding symptoms (PSA 0.67); -likely related to overstimulation of alpha receptors at  bladder neck; seen by Dr. Reece Agar in 9/05. no BPH at that visit  . Tinea versicolor    dermatology referral; secondary to no treatement response to selenium sulfide; currently resolive    Family History  Problem Relation Age of Onset  . Alcohol abuse Father   . Liver cancer Father   . Diabetes Father    Social History:   reports that  has never smoked. he has  never used smokeless tobacco. He reports that he drinks alcohol. He reports that he does not use drugs.  Medications No current facility-administered medications for this encounter.   Current Outpatient Medications:  .  amLODipine (NORVASC) 10 MG tablet, Take 1 tablet (10 mg total) by mouth every morning., Disp: 90 tablet, Rfl: 1 .  aspirin 81 MG tablet, Take 1 tablet (81 mg total) by mouth daily., Disp: 30 tablet, Rfl: 2 .  hydrochlorothiazide (HYDRODIURIL) 25 MG tablet, Take 1 tablet (25 mg total) by mouth every morning., Disp: 90 tablet, Rfl: 1 .  lisinopril (PRINIVIL,ZESTRIL) 20 MG tablet, Take 1 tablet (20 mg total) by mouth daily., Disp: 90 tablet, Rfl: 1 .  Multiple Vitamins-Minerals (HM MULTIVITAMIN ADULT GUMMY PO), Take 1 tablet by mouth every morning., Disp: , Rfl:  .  atorvastatin (LIPITOR) 20 MG tablet, Take 1 tablet (20 mg total) by mouth daily. (Patient not taking: Reported on 11/25/2017), Disp: 90 tablet, Rfl: 1 .  diclofenac sodium (VOLTAREN) 1 % GEL, Apply 2 g topically 4 (four) times daily. (Patient not taking: Reported on 11/25/2017), Disp: 100 g, Rfl: 3 .  ibuprofen (ADVIL,MOTRIN) 600 MG tablet, TAKE 1 TABLET BY MOUTH EVERY 8 HOURS AS NEEDED FOR MODERATE PAIN (Patient not taking: Reported on 11/25/2017), Disp: 30 tablet, Rfl: 1 .  meloxicam (MOBIC) 15 MG tablet, Take 1 tablet (15 mg total) by mouth daily. (Patient not taking: Reported on 11/25/2017), Disp: 30 tablet, Rfl: 3 .  tadalafil (CIALIS) 10 MG tablet, Take 1 tablet (10 mg total) by mouth daily as needed for erectile dysfunction. (Patient not taking: Reported on 11/25/2017), Disp: 10 tablet, Rfl: 5  Exam: Current vital signs: BP (!) 163/125   Pulse 93   Temp 98.5 F (36.9 C) (Oral)   Resp 16   Ht 5\' 7"  (1.702 m)   Wt 81.6 kg (179 lb 14.3 oz)   SpO2 98%   BMI 28.18 kg/m  Vital signs in last 24 hours: Temp:  [98.5 F (36.9 C)] 98.5 F (36.9 C) (01/12 2254) Pulse Rate:  [93] 93 (01/12 2254) Resp:  [16] 16 (01/12  2254) BP: (163)/(125) 163/125 (01/12 2254) SpO2:  [98 %] 98 % (01/12 2254) Weight:  [81.6 kg (179 lb 14.3 oz)] 81.6 kg (179 lb 14.3 oz) (01/12 2338)  GENERAL: Awake, alert in NAD HEENT: - Normocephalic and atraumatic, dry mm, no LN++, no Thyromegally LUNGS - Clear to auscultation bilaterally with no wheezes CV - S1S2 RRR, no m/r/g, equal pulses bilaterally. ABDOMEN - Soft, nontender, nondistended with normoactive BS Ext: warm, well perfused, intact peripheral pulses, no edema  NEURO:  Mental Status: AA&Ox3  Language: speech is dysarthric.  Naming, repetition, fluency, and comprehension intact, albeit slow Cranial Nerves: PERRL. EOMI, visual fields full, no facial asymmetry, facial sensation intact, hearing intact, tongue/uvula/soft palate midline, normal sternocleidomastoid and trapezius muscle strength. No evidence of tongue atrophy or fibrillations Motor: Grossly symmetric antigravity in all 4 extremities without vertical drift. Tone: Mildly increased tone in the left upper and lower extremity, normal tone in the right upper and  lower extremity. Sensation- Intact to light touch bilaterally Coordination: FTN intact bilaterally Gait- deferred  NIHSS - 1 for dysarthria   Labs I have reviewed labs in epic and the results pertinent to this consultation are:  CBC    Component Value Date/Time   WBC 3.8 (L) 11/25/2017 2246   RBC 5.98 (H) 11/25/2017 2246   HGB 15.0 11/25/2017 2259   HGB 12.6 07/01/2015 1034   HCT 44.0 11/25/2017 2259   HCT 39.2 07/01/2015 1034   PLT 242 11/25/2017 2246   PLT 214 07/01/2015 1034   MCV 70.1 (L) 11/25/2017 2246   MCV 73 (L) 07/01/2015 1034   MCH 23.6 (L) 11/25/2017 2246   MCHC 33.7 11/25/2017 2246   RDW 14.2 11/25/2017 2246   RDW 16.5 (H) 07/01/2015 1034   LYMPHSABS PENDING 11/25/2017 2246   LYMPHSABS 1.0 07/01/2015 1034   MONOABS PENDING 11/25/2017 2246   EOSABS PENDING 11/25/2017 2246   EOSABS 0.2 07/01/2015 1034   BASOSABS PENDING  11/25/2017 2246   BASOSABS 0.0 07/01/2015 1034    CMP     Component Value Date/Time   NA 142 11/25/2017 2259   NA 138 07/04/2017 1648   K 3.3 (L) 11/25/2017 2259   CL 102 11/25/2017 2259   CO2 22 07/04/2017 1648   GLUCOSE 99 11/25/2017 2259   BUN 14 11/25/2017 2259   BUN 16 07/04/2017 1648   CREATININE 1.30 (H) 11/25/2017 2259   CREATININE 1.08 02/13/2015 1117   CALCIUM 9.5 07/04/2017 1648   PROT 7.8 05/29/2017 1537   ALBUMIN 4.4 05/29/2017 1537   AST 26 05/29/2017 1537   ALT 33 05/29/2017 1537   ALKPHOS 89 05/29/2017 1537   BILITOT 0.4 05/29/2017 1537   GFRNONAA 71 07/04/2017 1648   GFRNONAA 78 02/13/2015 1117   GFRAA 82 07/04/2017 1648   GFRAA >89 02/13/2015 1117    Lipid Panel     Component Value Date/Time   CHOL 295 (H) 05/29/2017 1537   TRIG 57 05/29/2017 1537   HDL 60 05/29/2017 1537   CHOLHDL 4.9 05/29/2017 1537   CHOLHDL 4.7 02/13/2015 1117   VLDL 18 02/13/2015 1117   LDLCALC 224 (H) 05/29/2017 1537     Imaging I have reviewed the images obtained:  CT-scan of the brain -old bilateral basal ganglia encephalomalacia, moderate to severe white matter changes, global atrophy, questionable potential acute left corona radiata/basal ganglia infarct. CT angiogram of the head and neck-no large vessel occlusion.  Multiple areas of stenoses intracranially in anterior and posterior circulation, possible chronic vert dissection without stenosis. CT perfusion-decreased T-max in the left cerebral hemisphere ? 2/2 Seizure  Assessment:  57 year old man with history of multiple prior strokes with residual weakness worse on the left, polysubstance abuse, hypertension, hyperlipidemia presented to the emergency room with multiple episodes of what sounds like either speech arrest or a aphasia that completely resolved. No acute abnormalities other than a possible potential acute left corona radiata/basal ganglia infarct- unlikely to cause aphasia. Possible decreased T-max on the  CT perfusion study in the left cerebral hemisphere which could be indicative of seizure-like phenomenon. Given his bad history in terms of strokes, left grossly abnormal looking head and neck blood vessels, it is reasonable to evaluate him further with advanced imaging and also worked him up for stroke risk factors. Seizure workup is also recommended at this time.  TPA was not administered as his symptoms had resolved at the time of encounter.  He is not a candidate for endovascular thrombectomy due to  no large vessel occlusion.  Impression: Evaluate for stroke Evaluate for seizure  Recommendations: Ordered stat MRI of the brain-patient refused as he is extremely claustrophobic.  Will need MRI with sedation in the morning. Stroke workup to include: -telemetry -Hemoglobin A1c LDL -MRI of the brain without contrast -2D echocardiogram --For prophylaxis-currently on aspirin.  Change to Plavix 75 p.o. Once daily for now.  Can consider dual antiplatelets on him but he has had a history of ICH-will defer the decision to the stroke team. --Atorvastatin 80 mg -Allow for permissive hypertension in the first 24-48 hours.  Treat only if systolic blood pressures greater than 220 as needed.  Normalize blood pressure on discharge. -Frequent neuro checks -PT OT and speech consult  For seizure workup: -Obtain a routine EEG -Hold off on starting antiepileptics for now.  Will consider if EEG shows abnormality and or he has another clinical episode with cortical findings without any evidence of an infarct on MRI. -Maintain seizure precautions  Please page stroke NP/PA/MD (listed on AMION)  from 8am-4 pm as this patient will be followed by the stroke team at this point.  -- Amie Portland, MD [facility] Pager: [phone] If 7pm to 7am, please call on call as listed on AMION.   CRITICAL CARE ATTESTATION This patient is critically ill and at significant risk of neurological worsening,  death and care requires constant monitoring of vital signs, hemodynamics,respiratory and cardiac monitoring. I spent 35  minutes of neurocritical care time performing neurological assessment, discussion with family, other specialists and medical decision making of high complexityin the care of  this patient.

## 2017-11-25 NOTE — ED Triage Notes (Signed)
Expressive aphasia at this time.  Dr. Billy Fischer at bedside.

## 2017-11-26 ENCOUNTER — Inpatient Hospital Stay (HOSPITAL_COMMUNITY): Payer: Medicare HMO

## 2017-11-26 ENCOUNTER — Observation Stay (HOSPITAL_COMMUNITY): Payer: Medicare HMO

## 2017-11-26 ENCOUNTER — Encounter (HOSPITAL_COMMUNITY): Payer: Self-pay | Admitting: Family Medicine

## 2017-11-26 DIAGNOSIS — R29717 NIHSS score 17: Secondary | ICD-10-CM | POA: Diagnosis not present

## 2017-11-26 DIAGNOSIS — I63312 Cerebral infarction due to thrombosis of left middle cerebral artery: Secondary | ICD-10-CM | POA: Diagnosis not present

## 2017-11-26 DIAGNOSIS — I63512 Cerebral infarction due to unspecified occlusion or stenosis of left middle cerebral artery: Secondary | ICD-10-CM | POA: Diagnosis not present

## 2017-11-26 DIAGNOSIS — R4182 Altered mental status, unspecified: Secondary | ICD-10-CM | POA: Diagnosis not present

## 2017-11-26 DIAGNOSIS — N181 Chronic kidney disease, stage 1: Secondary | ICD-10-CM

## 2017-11-26 DIAGNOSIS — J9382 Other air leak: Secondary | ICD-10-CM | POA: Diagnosis not present

## 2017-11-26 DIAGNOSIS — I5032 Chronic diastolic (congestive) heart failure: Secondary | ICD-10-CM | POA: Diagnosis not present

## 2017-11-26 DIAGNOSIS — E876 Hypokalemia: Secondary | ICD-10-CM | POA: Diagnosis not present

## 2017-11-26 DIAGNOSIS — I130 Hypertensive heart and chronic kidney disease with heart failure and stage 1 through stage 4 chronic kidney disease, or unspecified chronic kidney disease: Secondary | ICD-10-CM | POA: Diagnosis not present

## 2017-11-26 DIAGNOSIS — F482 Pseudobulbar affect: Secondary | ICD-10-CM | POA: Diagnosis present

## 2017-11-26 DIAGNOSIS — D509 Iron deficiency anemia, unspecified: Secondary | ICD-10-CM | POA: Diagnosis not present

## 2017-11-26 DIAGNOSIS — Z9911 Dependence on respirator [ventilator] status: Secondary | ICD-10-CM | POA: Diagnosis not present

## 2017-11-26 DIAGNOSIS — R471 Dysarthria and anarthria: Secondary | ICD-10-CM | POA: Diagnosis present

## 2017-11-26 DIAGNOSIS — R4701 Aphasia: Secondary | ICD-10-CM | POA: Diagnosis present

## 2017-11-26 DIAGNOSIS — N179 Acute kidney failure, unspecified: Secondary | ICD-10-CM | POA: Diagnosis not present

## 2017-11-26 DIAGNOSIS — E871 Hypo-osmolality and hyponatremia: Secondary | ICD-10-CM | POA: Diagnosis not present

## 2017-11-26 DIAGNOSIS — I69354 Hemiplegia and hemiparesis following cerebral infarction affecting left non-dominant side: Secondary | ICD-10-CM | POA: Diagnosis not present

## 2017-11-26 DIAGNOSIS — R29701 NIHSS score 1: Secondary | ICD-10-CM | POA: Diagnosis present

## 2017-11-26 DIAGNOSIS — J9621 Acute and chronic respiratory failure with hypoxia: Secondary | ICD-10-CM | POA: Diagnosis not present

## 2017-11-26 DIAGNOSIS — G8191 Hemiplegia, unspecified affecting right dominant side: Secondary | ICD-10-CM | POA: Diagnosis present

## 2017-11-26 DIAGNOSIS — I351 Nonrheumatic aortic (valve) insufficiency: Secondary | ICD-10-CM | POA: Diagnosis not present

## 2017-11-26 DIAGNOSIS — X58XXXA Exposure to other specified factors, initial encounter: Secondary | ICD-10-CM | POA: Diagnosis not present

## 2017-11-26 DIAGNOSIS — I6300 Cerebral infarction due to thrombosis of unspecified precerebral artery: Secondary | ICD-10-CM | POA: Diagnosis not present

## 2017-11-26 DIAGNOSIS — J384 Edema of larynx: Secondary | ICD-10-CM | POA: Diagnosis present

## 2017-11-26 DIAGNOSIS — R0902 Hypoxemia: Secondary | ICD-10-CM | POA: Diagnosis not present

## 2017-11-26 DIAGNOSIS — R509 Fever, unspecified: Secondary | ICD-10-CM | POA: Diagnosis not present

## 2017-11-26 DIAGNOSIS — E870 Hyperosmolality and hypernatremia: Secondary | ICD-10-CM | POA: Diagnosis not present

## 2017-11-26 DIAGNOSIS — Z978 Presence of other specified devices: Secondary | ICD-10-CM | POA: Diagnosis not present

## 2017-11-26 DIAGNOSIS — Z8673 Personal history of transient ischemic attack (TIA), and cerebral infarction without residual deficits: Secondary | ICD-10-CM

## 2017-11-26 DIAGNOSIS — R9401 Abnormal electroencephalogram [EEG]: Secondary | ICD-10-CM | POA: Diagnosis present

## 2017-11-26 DIAGNOSIS — J9811 Atelectasis: Secondary | ICD-10-CM | POA: Diagnosis not present

## 2017-11-26 DIAGNOSIS — R29713 NIHSS score 13: Secondary | ICD-10-CM | POA: Diagnosis not present

## 2017-11-26 DIAGNOSIS — Q382 Macroglossia: Secondary | ICD-10-CM | POA: Diagnosis not present

## 2017-11-26 DIAGNOSIS — Z930 Tracheostomy status: Secondary | ICD-10-CM | POA: Diagnosis not present

## 2017-11-26 DIAGNOSIS — I639 Cerebral infarction, unspecified: Principal | ICD-10-CM

## 2017-11-26 DIAGNOSIS — J9601 Acute respiratory failure with hypoxia: Secondary | ICD-10-CM

## 2017-11-26 DIAGNOSIS — J690 Pneumonitis due to inhalation of food and vomit: Secondary | ICD-10-CM | POA: Diagnosis not present

## 2017-11-26 DIAGNOSIS — I10 Essential (primary) hypertension: Secondary | ICD-10-CM | POA: Diagnosis not present

## 2017-11-26 DIAGNOSIS — R0603 Acute respiratory distress: Secondary | ICD-10-CM

## 2017-11-26 DIAGNOSIS — T17908A Unspecified foreign body in respiratory tract, part unspecified causing other injury, initial encounter: Secondary | ICD-10-CM | POA: Diagnosis not present

## 2017-11-26 DIAGNOSIS — I7774 Dissection of vertebral artery: Secondary | ICD-10-CM | POA: Diagnosis not present

## 2017-11-26 DIAGNOSIS — R1312 Dysphagia, oropharyngeal phase: Secondary | ICD-10-CM | POA: Diagnosis present

## 2017-11-26 DIAGNOSIS — T17908S Unspecified foreign body in respiratory tract, part unspecified causing other injury, sequela: Secondary | ICD-10-CM | POA: Diagnosis not present

## 2017-11-26 DIAGNOSIS — G1222 Progressive bulbar palsy: Secondary | ICD-10-CM | POA: Clinically undetermined

## 2017-11-26 DIAGNOSIS — I63039 Cerebral infarction due to thrombosis of unspecified carotid artery: Secondary | ICD-10-CM | POA: Diagnosis not present

## 2017-11-26 DIAGNOSIS — R29708 NIHSS score 8: Secondary | ICD-10-CM | POA: Diagnosis not present

## 2017-11-26 DIAGNOSIS — R1310 Dysphagia, unspecified: Secondary | ICD-10-CM | POA: Diagnosis not present

## 2017-11-26 LAB — BLOOD GAS, ARTERIAL
ACID-BASE DEFICIT: 0.6 mmol/L (ref 0.0–2.0)
ACID-BASE EXCESS: 1.8 mmol/L (ref 0.0–2.0)
BICARBONATE: 25.8 mmol/L (ref 20.0–28.0)
BICARBONATE: 24 mmol/L (ref 20.0–28.0)
Drawn by: 27022
Drawn by: 51191
FIO2: 60
LHR: 16 {breaths}/min
O2 CONTENT: 2 L/min
O2 SAT: 98.2 %
O2 Saturation: 99.1 %
PATIENT TEMPERATURE: 98.6
PEEP/CPAP: 5 cmH2O
PO2 ART: 249 mmHg — AB (ref 83.0–108.0)
Patient temperature: 99.9
VT: 530 mL
pCO2 arterial: 40.1 mmHg (ref 32.0–48.0)
pCO2 arterial: 43.7 mmHg (ref 32.0–48.0)
pH, Arterial: 7.424 (ref 7.350–7.450)
pH, Arterial: 7.362 (ref 7.350–7.450)
pO2, Arterial: 122 mmHg — ABNORMAL HIGH (ref 83.0–108.0)

## 2017-11-26 LAB — BASIC METABOLIC PANEL
ANION GAP: 13 (ref 5–15)
Anion gap: 12 (ref 5–15)
BUN: 9 mg/dL (ref 6–20)
BUN: 15 mg/dL (ref 6–20)
CALCIUM: 9.3 mg/dL (ref 8.9–10.3)
CALCIUM: 9.2 mg/dL (ref 8.9–10.3)
CO2: 23 mmol/L (ref 22–32)
CO2: 21 mmol/L — ABNORMAL LOW (ref 22–32)
CREATININE: 1.39 mg/dL — AB (ref 0.61–1.24)
CREATININE: 1.97 mg/dL — AB (ref 0.61–1.24)
Chloride: 103 mmol/L (ref 101–111)
Chloride: 106 mmol/L (ref 101–111)
GFR calc Af Amer: >60 mL/min (ref >60)
GFR, EST AFRICAN AMERICAN: 42 mL/min — AB (ref >60)
GFR, EST NON AFRICAN AMERICAN: 56 mL/min — AB (ref >60)
GFR, EST NON AFRICAN AMERICAN: 36 mL/min — AB (ref >60)
Glucose, Bld: 119 mg/dL — ABNORMAL HIGH (ref 65–99)
Glucose, Bld: 113 mg/dL — ABNORMAL HIGH (ref 65–99)
POTASSIUM: 3.2 mmol/L — AB (ref 3.5–5.1)
Potassium: 3.9 mmol/L (ref 3.5–5.1)
SODIUM: 138 mmol/L (ref 135–145)
SODIUM: 140 mmol/L (ref 135–145)

## 2017-11-26 LAB — COMPREHENSIVE METABOLIC PANEL
ALBUMIN: 3.9 g/dL (ref 3.5–5.0)
ALT: 21 U/L (ref 17–63)
AST: 27 U/L (ref 15–41)
Alkaline Phosphatase: 92 U/L (ref 38–126)
Anion gap: 11 (ref 5–15)
BUN: 13 mg/dL (ref 6–20)
CO2: 23 mmol/L (ref 22–32)
CREATININE: 1.3 mg/dL — AB (ref 0.61–1.24)
Calcium: 9.1 mg/dL (ref 8.9–10.3)
Chloride: 102 mmol/L (ref 101–111)
GFR calc Af Amer: >60 mL/min (ref >60)
GFR calc non Af Amer: >60 mL/min (ref >60)
GLUCOSE: 94 mg/dL (ref 65–99)
POTASSIUM: 3.3 mmol/L — AB (ref 3.5–5.1)
Sodium: 136 mmol/L (ref 135–145)
Total Bilirubin: 1 mg/dL (ref 0.3–1.2)
Total Protein: 7.5 g/dL (ref 6.5–8.1)

## 2017-11-26 LAB — DIFFERENTIAL
Basophils Absolute: 0 10*3/uL (ref 0.0–0.1)
Basophils Relative: 1 %
EOS PCT: 8 %
Eosinophils Absolute: 0.3 10*3/uL (ref 0.0–0.7)
LYMPHS ABS: 1.3 10*3/uL (ref 0.7–4.0)
Lymphocytes Relative: 35 %
MONO ABS: 0.5 10*3/uL (ref 0.1–1.0)
MONOS PCT: 13 %
NEUTROS ABS: 1.7 10*3/uL (ref 1.7–7.7)
Neutrophils Relative %: 43 %

## 2017-11-26 LAB — LIPID PANEL
CHOLESTEROL: 322 mg/dL — AB (ref 0–200)
HDL: 57 mg/dL (ref >40)
LDL Cholesterol: 251 mg/dL — ABNORMAL HIGH (ref 0–99)
Total CHOL/HDL Ratio: 5.6 RATIO
Triglycerides: 69 mg/dL (ref <150)
VLDL: 14 mg/dL (ref 0–40)

## 2017-11-26 LAB — URINALYSIS, ROUTINE W REFLEX MICROSCOPIC
Bacteria, UA: NONE SEEN
Bilirubin Urine: NEGATIVE
GLUCOSE, UA: NEGATIVE mg/dL
Ketones, ur: NEGATIVE mg/dL
Leukocytes, UA: NEGATIVE
Nitrite: NEGATIVE
Protein, ur: 30 mg/dL — AB
Specific Gravity, Urine: 1.009 (ref 1.005–1.030)
pH: 7 (ref 5.0–8.0)

## 2017-11-26 LAB — RAPID URINE DRUG SCREEN, HOSP PERFORMED
Amphetamines: NOT DETECTED
BENZODIAZEPINES: NOT DETECTED
Barbiturates: NOT DETECTED
COCAINE: NOT DETECTED
Opiates: NOT DETECTED
Tetrahydrocannabinol: NOT DETECTED

## 2017-11-26 LAB — CBC
HCT: 41.8 % (ref 39.0–52.0)
Hemoglobin: 13.9 g/dL (ref 13.0–17.0)
MCH: 23.4 pg — AB (ref 26.0–34.0)
MCHC: 33.3 g/dL (ref 30.0–36.0)
MCV: 70.3 fL — ABNORMAL LOW (ref 78.0–100.0)
PLATELETS: 249 10*3/uL (ref 150–400)
RBC: 5.95 MIL/uL — AB (ref 4.22–5.81)
RDW: 14.4 % (ref 11.5–15.5)
WBC: 5.2 10*3/uL (ref 4.0–10.5)

## 2017-11-26 LAB — TRIGLYCERIDES: Triglycerides: 92 mg/dL (ref <150)

## 2017-11-26 LAB — MAGNESIUM: Magnesium: 1.9 mg/dL (ref 1.7–2.4)

## 2017-11-26 MED ORDER — CHLORHEXIDINE GLUCONATE 0.12% ORAL RINSE (MEDLINE KIT)
15.0000 mL | Freq: Two times a day (BID) | OROMUCOSAL | Status: DC
  Administered 2017-11-26 – 2017-12-08 (×25): 15 mL via OROMUCOSAL

## 2017-11-26 MED ORDER — ACETAMINOPHEN 160 MG/5ML PO SOLN
650.0000 mg | ORAL | Status: DC | PRN
  Administered 2017-12-04 – 2017-12-10 (×8): 650 mg
  Filled 2017-11-26 (×8): qty 20.3

## 2017-11-26 MED ORDER — MIDAZOLAM HCL 2 MG/2ML IJ SOLN
INTRAMUSCULAR | Status: AC
  Administered 2017-11-26: 2 mg
  Filled 2017-11-26: qty 2

## 2017-11-26 MED ORDER — ETOMIDATE 2 MG/ML IV SOLN
0.3000 mg/kg | Freq: Once | INTRAVENOUS | Status: AC
  Administered 2017-11-26: 25 mg via INTRAVENOUS

## 2017-11-26 MED ORDER — FENTANYL CITRATE (PF) 100 MCG/2ML IJ SOLN
100.0000 ug | INTRAMUSCULAR | Status: AC | PRN
  Administered 2017-11-29 – 2017-12-01 (×3): 100 ug via INTRAVENOUS
  Filled 2017-11-26 (×4): qty 2

## 2017-11-26 MED ORDER — ALBUTEROL SULFATE (2.5 MG/3ML) 0.083% IN NEBU
2.5000 mg | INHALATION_SOLUTION | RESPIRATORY_TRACT | Status: DC | PRN
  Administered 2017-12-01 – 2017-12-10 (×3): 2.5 mg via RESPIRATORY_TRACT
  Filled 2017-11-26 (×3): qty 3

## 2017-11-26 MED ORDER — POTASSIUM CHLORIDE 10 MEQ/100ML IV SOLN
10.0000 meq | INTRAVENOUS | Status: AC
  Administered 2017-11-26 (×3): 10 meq via INTRAVENOUS
  Filled 2017-11-26 (×4): qty 100

## 2017-11-26 MED ORDER — ENOXAPARIN SODIUM 40 MG/0.4ML ~~LOC~~ SOLN
40.0000 mg | SUBCUTANEOUS | Status: DC
  Administered 2017-11-26 – 2017-11-29 (×4): 40 mg via SUBCUTANEOUS
  Filled 2017-11-26 (×4): qty 0.4

## 2017-11-26 MED ORDER — POTASSIUM CHLORIDE IN NACL 40-0.9 MEQ/L-% IV SOLN
INTRAVENOUS | Status: DC
  Administered 2017-11-26: 75 mL/h via INTRAVENOUS
  Filled 2017-11-26: qty 1000

## 2017-11-26 MED ORDER — MIDAZOLAM HCL 2 MG/2ML IJ SOLN
2.0000 mg | INTRAMUSCULAR | Status: AC | PRN
  Administered 2017-11-26 – 2017-11-27 (×3): 2 mg via INTRAVENOUS
  Filled 2017-11-26 (×2): qty 2

## 2017-11-26 MED ORDER — FAMOTIDINE IN NACL 20-0.9 MG/50ML-% IV SOLN
20.0000 mg | Freq: Two times a day (BID) | INTRAVENOUS | Status: DC
  Administered 2017-11-26: 20 mg via INTRAVENOUS
  Filled 2017-11-26: qty 50

## 2017-11-26 MED ORDER — POTASSIUM CHLORIDE IN NACL 40-0.9 MEQ/L-% IV SOLN
INTRAVENOUS | Status: AC
  Administered 2017-11-26: 110 mL/h via INTRAVENOUS
  Filled 2017-11-26 (×2): qty 1000

## 2017-11-26 MED ORDER — PROPOFOL 1000 MG/100ML IV EMUL
INTRAVENOUS | Status: AC
  Filled 2017-11-26: qty 100

## 2017-11-26 MED ORDER — FENTANYL CITRATE (PF) 100 MCG/2ML IJ SOLN
100.0000 ug | INTRAMUSCULAR | Status: DC | PRN
  Administered 2017-11-26 – 2017-12-06 (×7): 100 ug via INTRAVENOUS
  Filled 2017-11-26 (×8): qty 2

## 2017-11-26 MED ORDER — SODIUM CHLORIDE 0.9 % IV SOLN
250.0000 mL | INTRAVENOUS | Status: DC | PRN
  Administered 2017-11-26 – 2017-12-03 (×2): 250 mL via INTRAVENOUS

## 2017-11-26 MED ORDER — ACETAMINOPHEN 650 MG RE SUPP: 650.0000 mg | RECTAL | Status: DC | PRN

## 2017-11-26 MED ORDER — SENNOSIDES 8.8 MG/5ML PO SYRP
5.0000 mL | ORAL_SOLUTION | Freq: Two times a day (BID) | ORAL | Status: DC
  Administered 2017-11-27 – 2017-12-02 (×11): 5 mL via ORAL
  Filled 2017-11-26 (×11): qty 5

## 2017-11-26 MED ORDER — STROKE: EARLY STAGES OF RECOVERY BOOK
Freq: Once | Status: AC
  Administered 2017-11-26: 04:00:00
  Filled 2017-11-26: qty 1

## 2017-11-26 MED ORDER — MIDAZOLAM HCL 2 MG/2ML IJ SOLN
2.0000 mg | INTRAMUSCULAR | Status: DC | PRN
  Administered 2017-11-29 – 2017-12-06 (×7): 2 mg via INTRAVENOUS
  Filled 2017-11-26 (×9): qty 2

## 2017-11-26 MED ORDER — SODIUM CHLORIDE 0.9 % IV SOLN: INTRAVENOUS | Status: DC

## 2017-11-26 MED ORDER — ACETAMINOPHEN 325 MG PO TABS
650.0000 mg | ORAL_TABLET | ORAL | Status: DC | PRN
  Administered 2017-12-10: 650 mg via ORAL
  Filled 2017-11-26 (×2): qty 2

## 2017-11-26 MED ORDER — ATORVASTATIN CALCIUM 80 MG PO TABS
80.0000 mg | ORAL_TABLET | Freq: Every day | ORAL | Status: DC
  Administered 2017-11-27 – 2017-11-30 (×4): 80 mg via ORAL
  Filled 2017-11-26 (×4): qty 1

## 2017-11-26 MED ORDER — SUCCINYLCHOLINE CHLORIDE 20 MG/ML IJ SOLN
100.0000 mg | Freq: Once | INTRAMUSCULAR | Status: AC
  Administered 2017-11-26: 100 mg via INTRAVENOUS
  Filled 2017-11-26: qty 5

## 2017-11-26 MED ORDER — PROPOFOL 1000 MG/100ML IV EMUL
0.0000 ug/kg/min | INTRAVENOUS | Status: DC
  Administered 2017-11-27: 50 ug/kg/min via INTRAVENOUS
  Administered 2017-11-27: 35 ug/kg/min via INTRAVENOUS
  Administered 2017-11-27 (×3): 50 ug/kg/min via INTRAVENOUS
  Administered 2017-11-28: 30 ug/kg/min via INTRAVENOUS
  Administered 2017-11-28: 35 ug/kg/min via INTRAVENOUS
  Administered 2017-11-28 – 2017-11-29 (×2): 20 ug/kg/min via INTRAVENOUS
  Administered 2017-12-02: 50 ug/kg/min via INTRAVENOUS
  Administered 2017-12-02: 10 ug/kg/min via INTRAVENOUS
  Administered 2017-12-02: 30 ug/kg/min via INTRAVENOUS
  Administered 2017-12-03: 10 ug/kg/min via INTRAVENOUS
  Filled 2017-11-26 (×14): qty 100

## 2017-11-26 MED ORDER — LABETALOL HCL 5 MG/ML IV SOLN
5.0000 mg | INTRAVENOUS | Status: DC | PRN
  Administered 2017-11-26 – 2017-12-21 (×24): 5 mg via INTRAVENOUS
  Filled 2017-11-26 (×26): qty 4

## 2017-11-26 MED ORDER — CLOPIDOGREL BISULFATE 75 MG PO TABS
75.0000 mg | ORAL_TABLET | Freq: Every day | ORAL | Status: DC
  Administered 2017-11-26 – 2017-11-27 (×2): 75 mg via ORAL
  Filled 2017-11-26 (×2): qty 1

## 2017-11-26 MED ORDER — DOCUSATE SODIUM 50 MG/5ML PO LIQD: 100.0000 mg | Freq: Two times a day (BID) | ORAL | Status: DC | PRN

## 2017-11-26 MED ORDER — DEXTROSE-NACL 5-0.45 % IV SOLN: INTRAVENOUS | Status: DC

## 2017-11-26 MED ORDER — DOCUSATE SODIUM 50 MG/5ML PO LIQD
100.0000 mg | Freq: Two times a day (BID) | ORAL | Status: DC
  Administered 2017-11-27 – 2017-12-22 (×47): 100 mg
  Filled 2017-11-26 (×48): qty 10

## 2017-11-26 MED ORDER — ONDANSETRON HCL 4 MG/2ML IJ SOLN: 4.0000 mg | Freq: Four times a day (QID) | INTRAMUSCULAR | Status: DC | PRN

## 2017-11-26 MED ORDER — SENNOSIDES-DOCUSATE SODIUM 8.6-50 MG PO TABS: 1.0000 | ORAL_TABLET | Freq: Every evening | ORAL | Status: DC | PRN

## 2017-11-26 MED ORDER — ASPIRIN 300 MG RE SUPP
300.0000 mg | Freq: Every day | RECTAL | Status: DC
  Filled 2017-11-26: qty 1

## 2017-11-26 MED ORDER — LORAZEPAM 2 MG/ML IJ SOLN: 1.0000 mg | Freq: Once | INTRAMUSCULAR | Status: DC | PRN

## 2017-11-26 MED ORDER — ORAL CARE MOUTH RINSE
15.0000 mL | Freq: Four times a day (QID) | OROMUCOSAL | Status: DC
  Administered 2017-11-27: 15 mL via OROMUCOSAL

## 2017-11-26 MED ORDER — SODIUM CHLORIDE 0.9 % IV SOLN
INTRAVENOUS | Status: DC
  Administered 2017-11-27 – 2017-12-03 (×11): via INTRAVENOUS

## 2017-11-26 NOTE — H&P (Signed)
History and Physical    KATIE MOCH JKD:[id number] DOB: 06-06-1961 DOA: 11/25/2017  PCP: Tawny Asal, MD   Patient coming from: Home  Chief Complaint: Speech difficulty   HPI: [name] is a 57 y.o. male with medical history significant for hypertension, hyperlipidemia, polysubstance abuse, and history of CVAs with residual weakness, now presenting to the emergency department for evaluation of an acute speech difficulty.  Patient was noted by his wife to be staring at her at approximately 8 PM, but seemed unable to speak.  The patient remembers the episode and states that he could not speak.  This resolved spontaneously over the course of a few minutes and recurred in the parking lot of the emergency department after his wife had driven him to the hospital.  There was no recent fall or trauma, no recent fevers or chills, no chest pain or palpitations, and no recent alcohol or illicit substance use.  He had never experienced similar symptoms previously.  ED Course: Upon arrival to the ED, patient is found to be afebrile, saturating well on room air, hypertensive to 165/120, and with vitals otherwise normal.  EKG features a normal sinus rhythm.  Chemistry panel is notable for potassium 3.3 and creatinine of 1.30.  CBC is notable for a stable chronic mild leukopenia with WBC 3800 cytosis with MCV of 70.1.  Troponin are within normal limits.  Noncontrast head CT is concerning for possible acute left basal ganglia infarct as well as old bilateral basal ganglia infarcts.  CTA head and neck is negative for emergent large vessel occlusion or hemodynamically significant stenosis, but notable for non-flow-limiting left vertebral artery dissection and right M2/M3 occlusion.  Neurology was consulted by the ED physician and recommended a medical admission for ongoing evaluation and management of possible CVA versus seizure.  Review of Systems:  All other systems reviewed and apart from HPI, are  negative.  Past Medical History:  Diagnosis Date  . Arthritis   . Chest pain    with a negative adenosine cardiolite in December 2006. Dr. Liana Crocker   . 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 Diastolic dysfunction    impaired L ventricle relatation with an EF of 65% and echo in December 2005  . Erectile dysfunction   . Hyperlipidemia    elevated transaminases on statin?-40's (2/06) - Hep B/C negative 4/06; resolution on 4/06 labs; off statin in 2006  . Hypertension    concentric LVH 2d echo 12/05; negative proteinuria 11/00  . 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 Onychomycosis April 2003   right big toe  . Prostate disorder    prostate irregularity with obstructive voiding symptoms (PSA 0.67); -likely related to overstimulation of alpha receptors at bladder neck; seen by Dr. Reece Agar in 9/05. no BPH at that visit  . Tinea versicolor    dermatology referral; secondary to no treatement response to selenium sulfide; currently resolive    Past Surgical History:  Procedure Laterality Date  . COLONOSCOPY WITH PROPOFOL N/A 05/22/2013   Procedure: COLONOSCOPY WITH PROPOFOL;  Surgeon: Arta Silence, MD;  Location: [facility];  Service: Endoscopy;  Laterality: N/A;     reports that  has never smoked. he has never used smokeless tobacco. He reports that he drinks alcohol. He reports that he does not use drugs.  No Known Allergies  Family History  Problem Relation Age of Onset  . Alcohol abuse Father   . Liver cancer Father   . Diabetes Father      Prior to Admission medications   Medication Sig Start Date End Date Taking?  Authorizing Provider  amLODipine (NORVASC) 10 MG tablet Take 1 tablet (10 mg total) by mouth every morning. 05/29/17  Yes Tawny Asal, MD  aspirin 81 MG tablet Take 1 tablet (81 mg total) by mouth daily. 10/04/16  Yes Rivet, Sindy Guadeloupe, MD  hydrochlorothiazide (HYDRODIURIL) 25 MG tablet Take 1 tablet (25 mg total) by mouth every morning. 05/29/17  Yes Tawny Asal, MD  lisinopril (PRINIVIL,ZESTRIL) 20 MG tablet Take 1 tablet (20 mg total) by mouth daily. 05/29/17  Yes Tawny Asal, MD  Multiple Vitamins-Minerals (HM MULTIVITAMIN ADULT GUMMY PO) Take 1 tablet by mouth every morning.   Yes [provider]  atorvastatin (LIPITOR) 20 MG tablet Take 1 tablet (20 mg total) by mouth daily. Patient not taking: Reported on 11/25/2017 05/29/17   Tawny Asal, MD  diclofenac sodium (VOLTAREN) 1 % GEL Apply 2 g topically 4 (four) times daily. Patient not taking: Reported on 11/25/2017 12/17/13   Ivor Costa, MD  ibuprofen (ADVIL,MOTRIN) 600 MG tablet TAKE 1 TABLET BY MOUTH EVERY 8 HOURS AS NEEDED FOR MODERATE PAIN Patient not taking: Reported on 11/25/2017 12/12/15   Rivet, Sindy Guadeloupe, MD  meloxicam (MOBIC) 15 MG tablet Take 1 tablet (15 mg total) by mouth daily. Patient not taking: Reported on 11/25/2017 05/29/17   Tawny Asal, MD  tadalafil (CIALIS) 10 MG tablet Take 1 tablet (10 mg total) by mouth daily as needed for erectile dysfunction. Patient not taking: Reported on 11/25/2017 07/04/17   Tawny Asal, MD    Physical Exam: Vitals:   11/25/17 2254 11/25/17 2338  BP: (!) 163/125   Pulse: 93   Resp: 16   Temp: 98.5 F (36.9 C)   TempSrc: Oral   SpO2: 98%   Weight:  81.6 kg (179 lb 14.3 oz)  Height:  5\' 7"  (1.702 m)      Constitutional: NAD, calm  Eyes: PERTLA, lids and conjunctivae normal ENMT: Mucous membranes are moist. Posterior pharynx clear of any exudate or lesions.   Neck: normal, supple, no masses, no thyromegaly Respiratory: clear to auscultation bilaterally, no wheezing, no  crackles. Normal respiratory effort. No accessory muscle use.  Cardiovascular: S1 & S2 heard, regular rate and rhythm. No significant JVD. Abdomen: No distension, no tenderness, no masses palpated. Bowel sounds normal.  Musculoskeletal: no clubbing / cyanosis. No joint deformity upper and lower extremities.    Skin: no significant rashes, lesions, ulcers. Warm, dry, well-perfused. Neurologic: Dysarthria and expressive aphasia noted, CN II-XII intact otherwise. Sensation to light touch intact. Moving all extremities.  Psychiatric: Alert and oriented x 3. Calm. Cooperative.     Labs on Admission: I have personally reviewed following labs and imaging studies  CBC: Recent Labs  Lab 11/25/17 2246 11/25/17 2259  WBC 3.8*  --   NEUTROABS 1.7  --   HGB 14.1 15.0  HCT 41.9 44.0  MCV 70.1*  --   PLT 242  --    Basic Metabolic Panel: Recent Labs  Lab 11/25/17 2246 11/25/17 2259  NA 136 142  K 3.3* 3.3*  CL 102 102  CO2 23  --  GLUCOSE 94 99  BUN 13 14  CREATININE 1.30* 1.30*  CALCIUM 9.1  --    GFR: Estimated Creatinine Clearance: 65.7 mL/min (A) (by C-G formula based on SCr of 1.3 mg/dL (H)). Liver Function Tests: Recent Labs  Lab 11/25/17 2246  AST 27  ALT 21  ALKPHOS 92  BILITOT 1.0  PROT 7.5  ALBUMIN 3.9   No results for input(s): LIPASE, AMYLASE in the last 168 hours. No results for input(s): AMMONIA in the last 168 hours. Coagulation Profile: Recent Labs  Lab 11/25/17 2246  INR 0.98   Cardiac Enzymes: No results for input(s): CKTOTAL, CKMB, CKMBINDEX, TROPONINI in the last 168 hours. BNP (last 3 results) No results for input(s): PROBNP in the last 8760 hours. HbA1C: No results for input(s): HGBA1C in the last 72 hours. CBG: Recent Labs  Lab 11/25/17 2256  GLUCAP 87   Lipid Profile: No results for input(s): CHOL, HDL, LDLCALC, TRIG, CHOLHDL, LDLDIRECT in the last 72 hours. Thyroid Function Tests: No results for input(s): TSH, T4TOTAL, FREET4,  T3FREE, THYROIDAB in the last 72 hours. Anemia Panel: No results for input(s): VITAMINB12, FOLATE, FERRITIN, TIBC, IRON, RETICCTPCT in the last 72 hours. Urine analysis:    Component Value Date/Time   COLORURINE YELLOW 06/07/2013 1844   APPEARANCEUR CLEAR 06/07/2013 1844   LABSPEC 1.022 06/07/2013 1844   PHURINE 6.0 06/07/2013 1844   GLUCOSEU NEGATIVE 06/07/2013 1844   HGBUR NEGATIVE 06/07/2013 1844   BILIRUBINUR NEGATIVE 06/07/2013 1844   KETONESUR NEGATIVE 06/07/2013 1844   PROTEINUR NEGATIVE 06/07/2013 1844   UROBILINOGEN 0.2 06/07/2013 1844   NITRITE NEGATIVE 06/07/2013 1844   LEUKOCYTESUR NEGATIVE 06/07/2013 1844   Sepsis Labs: @LABRCNTIP (procalcitonin:4,lacticidven:4) )No results found for this or any previous visit (from the past 240 hour(s)).   Radiological Exams on Admission: Ct Angio Head W Or Wo Contrast  Result Date: 11/26/2017 CLINICAL DATA:  Follow-up code stroke. History of stroke, substance abuse, hypertension, hyperlipidemia. EXAM: CT ANGIOGRAPHY HEAD AND NECK CT PERFUSION BRAIN TECHNIQUE: Multidetector CT imaging of the head and neck was performed using the standard protocol during bolus administration of intravenous contrast. Multiplanar CT image reconstructions and MIPs were obtained to evaluate the vascular anatomy. Carotid stenosis measurements (when applicable) are obtained utilizing NASCET criteria, using the distal internal carotid diameter as the denominator. Multiphase CT imaging of the brain was performed following IV bolus contrast injection. Subsequent parametric perfusion maps were calculated using RAPID software. CONTRAST:  112mL ISOVUE-370 IOPAMIDOL (ISOVUE-370) INJECTION 76% COMPARISON:  CT HEAD November 25, 2017 at 11:11 p.m. MRA head and neck January 07, 2007 FINDINGS: CTA NECK FINDINGS: AORTIC ARCH: 3.6 cm ascending aorta, normal branch pattern. Mild intimal thickening aortic arch. The origins of the innominate, left Common carotid artery and subclavian  artery are widely patent. Mild stenosis mid LEFT subclavian artery. RIGHT CAROTID SYSTEM: Common carotid artery is widely patent, coursing in a straight line fashion. Normal appearance of the carotid bifurcation without hemodynamically significant stenosis by NASCET criteria. Tortuous course associated with hypertension. LEFT CAROTID SYSTEM: Common carotid artery is widely patent, coursing in a straight line fashion. Normal appearance of the carotid bifurcation without hemodynamically significant stenosis by NASCET criteria. Tortuous course associated with hypertension, widely patent; mild luminal irregularity most compatible with atherosclerosis. VERTEBRAL ARTERIES:New, age indeterminate LEFT V1 non flow limiting dissection flap without pseudo aneurysm. Mild LEFT vertebral artery luminal irregularity. Mild calcific atherosclerosis RIGHT vertebral artery origin. Bilateral vertebral arteries are patent. SKELETON: No acute osseous process though bone windows have not been submitted.  Poor dentition. OTHER NECK: Soft tissues of the neck are nonacute though, not tailored for evaluation. UPPER CHEST: Included lung apices are clear. No superior mediastinal lymphadenopathy. CTA HEAD FINDINGS: ANTERIOR CIRCULATION: Patent cervical internal carotid arteries, petrous, cavernous and supra clinoid internal carotid arteries. Calcific atherosclerosis resulting in moderate to severe stenosis RIGHT para clinoid internal carotid artery. Bilateral 2 mm paraophthalmic outpouchings (axial 103/352). Patent anterior communicating artery. Patent anterior and middle cerebral arteries. Moderate tandem stenoses bilateral anterior cerebral arteries. Moderate stenosis RIGHT M1 origin. Moderate to severe stenosis distal RIGHT M2. Focally occluded versus severe stenosis proximal RIGHT M3 segment. Severe stenosis LEFT M2 inferior division origin. No large vessel occlusion, significant stenosis, contrast extravasation or aneurysm. POSTERIOR  CIRCULATION: Patent vertebral arteries, vertebrobasilar junction and basilar artery, as well as main branch vessels with mild luminal irregularity. Moderate stenosis RIGHT V4 segment with mild poststenotic dilatation. Patent posterior cerebral arteries. Moderate to severe tandem stenoses RIGHT posterior cerebral artery. Moderate tandem stenosis LEFT posterior cerebral artery. No large vessel occlusion, significant stenosis, contrast extravasation or aneurysm. VENOUS SINUSES: Major dural venous sinuses are patent though not tailored for evaluation on this angiographic examination. ANATOMIC VARIANTS: None. DELAYED PHASE: Not performed. MIP images reviewed. CT Brain Perfusion Findings: CBF (<30%) Volume: 67mL Perfusion (Tmax>6.0s) volume: 47mL Mismatch Volume: 3mL Infarction Location:Perfusion mismatch bifrontal lobes, likely artifact. Prolonged mean transit time RIGHT basal ganglia and to lesser extent RIGHT MCA territory. IMPRESSION: CTA NECK: 1. No hemodynamically significant stenosis or acute vascular process in the neck. 2. 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 No pseudoaneurysm. 3. **An incidental finding of potential clinical significance has been found. Ectatic 3.6 cm ascending aorta. Recommend annual imaging followup by CTA or MRA. This recommendation follows 2010 ACCF/AHA/AATS/ACR/ASA/SCA/SCAI/SIR/STS/SVM Guidelines for the Diagnosis and Management of Patients with Thoracic Aortic Disease. Circulation.2010; 121: I786-V672** CTA HEAD: 1. No emergent large vessel occlusion. Focally occluded RIGHT M2/M3 segment. 2. Advanced intracranial atherosclerosis with multifocal high-grade stenosis including severe stenosis LEFT M2 segment, moderate to severe stenoses RIGHT middle cerebral artery and LEFT posterior cerebral artery. 3. 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 CT PERFUSION: 1. No acute perfusion defect. 2. Prolonged mean transit time RIGHT MCA  consistent with stenoses. Acute findings discussed with and reconfirmed by Dr.ASHISH ARORA on 11/25/2017 at 12:00 am. Electronically Signed   By: Elon Alas M.D.   On: 11/26/2017 00:03   Ct Angio Neck W Or Wo Contrast  Result Date: 11/26/2017 CLINICAL DATA:  Follow-up code stroke. History of stroke, substance abuse, hypertension, hyperlipidemia. EXAM: CT ANGIOGRAPHY HEAD AND NECK CT PERFUSION BRAIN TECHNIQUE: Multidetector CT imaging of the head and neck was performed using the standard protocol during bolus administration of intravenous contrast. Multiplanar CT image reconstructions and MIPs were obtained to evaluate the vascular anatomy. Carotid stenosis measurements (when applicable) are obtained utilizing NASCET criteria, using the distal internal carotid diameter as the denominator. Multiphase CT imaging of the brain was performed following IV bolus contrast injection. Subsequent parametric perfusion maps were calculated using RAPID software. CONTRAST:  140mL ISOVUE-370 IOPAMIDOL (ISOVUE-370) INJECTION 76% COMPARISON:  CT HEAD November 25, 2017 at 11:11 p.m. MRA head and neck January 07, 2007 FINDINGS: CTA NECK FINDINGS: AORTIC ARCH: 3.6 cm ascending aorta, normal branch pattern. Mild intimal thickening aortic arch. The origins of the innominate, left Common carotid artery and subclavian artery are widely patent. Mild stenosis mid LEFT subclavian artery. RIGHT CAROTID SYSTEM: Common carotid artery is widely patent, coursing in a straight line fashion.  Normal appearance of the carotid bifurcation without hemodynamically significant stenosis by NASCET criteria. Tortuous course associated with hypertension. LEFT CAROTID SYSTEM: Common carotid artery is widely patent, coursing in a straight line fashion. Normal appearance of the carotid bifurcation without hemodynamically significant stenosis by NASCET criteria. Tortuous course associated with hypertension, widely patent; mild luminal irregularity most  compatible with atherosclerosis. VERTEBRAL ARTERIES:New, age indeterminate LEFT V1 non flow limiting dissection flap without pseudo aneurysm. Mild LEFT vertebral artery luminal irregularity. Mild calcific atherosclerosis RIGHT vertebral artery origin. Bilateral vertebral arteries are patent. SKELETON: No acute osseous process though bone windows have not been submitted. Poor dentition. OTHER NECK: Soft tissues of the neck are nonacute though, not tailored for evaluation. UPPER CHEST: Included lung apices are clear. No superior mediastinal lymphadenopathy. CTA HEAD FINDINGS: ANTERIOR CIRCULATION: Patent cervical internal carotid arteries, petrous, cavernous and supra clinoid internal carotid arteries. Calcific atherosclerosis resulting in moderate to severe stenosis RIGHT para clinoid internal carotid artery. Bilateral 2 mm paraophthalmic outpouchings (axial 103/352). Patent anterior communicating artery. Patent anterior and middle cerebral arteries. Moderate tandem stenoses bilateral anterior cerebral arteries. Moderate stenosis RIGHT M1 origin. Moderate to severe stenosis distal RIGHT M2. Focally occluded versus severe stenosis proximal RIGHT M3 segment. Severe stenosis LEFT M2 inferior division origin. No large vessel occlusion, significant stenosis, contrast extravasation or aneurysm. POSTERIOR CIRCULATION: Patent vertebral arteries, vertebrobasilar junction and basilar artery, as well as main branch vessels with mild luminal irregularity. Moderate stenosis RIGHT V4 segment with mild poststenotic dilatation. Patent posterior cerebral arteries. Moderate to severe tandem stenoses RIGHT posterior cerebral artery. Moderate tandem stenosis LEFT posterior cerebral artery. No large vessel occlusion, significant stenosis, contrast extravasation or aneurysm. VENOUS SINUSES: Major dural venous sinuses are patent though not tailored for evaluation on this angiographic examination. ANATOMIC VARIANTS: None. DELAYED PHASE: Not  performed. MIP images reviewed. CT Brain Perfusion Findings: CBF (<30%) Volume: 13mL Perfusion (Tmax>6.0s) volume: 26mL Mismatch Volume: 31mL Infarction Location:Perfusion mismatch bifrontal lobes, likely artifact. Prolonged mean transit time RIGHT basal ganglia and to lesser extent RIGHT MCA territory. IMPRESSION: CTA NECK: 1. No hemodynamically significant stenosis or acute vascular process in the neck. 2. 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 No pseudoaneurysm. 3. **An incidental finding of potential clinical significance has been found. Ectatic 3.6 cm ascending aorta. Recommend annual imaging followup by CTA or MRA. This recommendation follows 2010 ACCF/AHA/AATS/ACR/ASA/SCA/SCAI/SIR/STS/SVM Guidelines for the Diagnosis and Management of Patients with Thoracic Aortic Disease. Circulation.2010; 121: V956-L875** CTA HEAD: 1. No emergent large vessel occlusion. Focally occluded RIGHT M2/M3 segment. 2. Advanced intracranial atherosclerosis with multifocal high-grade stenosis including severe stenosis LEFT M2 segment, moderate to severe stenoses RIGHT middle cerebral artery and LEFT posterior cerebral artery. 3. 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 CT PERFUSION: 1. No acute perfusion defect. 2. Prolonged mean transit time RIGHT MCA consistent with stenoses. Acute findings discussed with and reconfirmed by Dr.ASHISH ARORA on 11/25/2017 at 12:00 am. Electronically Signed   By: Elon Alas M.D.   On: 11/26/2017 00:03   Ct Cerebral Perfusion W Contrast  Result Date: 11/26/2017 CLINICAL DATA:  Follow-up code stroke. History of stroke, substance abuse, hypertension, hyperlipidemia. EXAM: CT ANGIOGRAPHY HEAD AND NECK CT PERFUSION BRAIN TECHNIQUE: Multidetector CT imaging of the head and neck was performed using the standard protocol during bolus administration of intravenous contrast. Multiplanar CT image reconstructions and MIPs were obtained to  evaluate the vascular anatomy. Carotid stenosis measurements (when applicable) are obtained utilizing NASCET criteria, using the distal internal carotid diameter as  the denominator. Multiphase CT imaging of the brain was performed following IV bolus contrast injection. Subsequent parametric perfusion maps were calculated using RAPID software. CONTRAST:  114mL ISOVUE-370 IOPAMIDOL (ISOVUE-370) INJECTION 76% COMPARISON:  CT HEAD November 25, 2017 at 11:11 p.m. MRA head and neck January 07, 2007 FINDINGS: CTA NECK FINDINGS: AORTIC ARCH: 3.6 cm ascending aorta, normal branch pattern. Mild intimal thickening aortic arch. The origins of the innominate, left Common carotid artery and subclavian artery are widely patent. Mild stenosis mid LEFT subclavian artery. RIGHT CAROTID SYSTEM: Common carotid artery is widely patent, coursing in a straight line fashion. Normal appearance of the carotid bifurcation without hemodynamically significant stenosis by NASCET criteria. Tortuous course associated with hypertension. LEFT CAROTID SYSTEM: Common carotid artery is widely patent, coursing in a straight line fashion. Normal appearance of the carotid bifurcation without hemodynamically significant stenosis by NASCET criteria. Tortuous course associated with hypertension, widely patent; mild luminal irregularity most compatible with atherosclerosis. VERTEBRAL ARTERIES:New, age indeterminate LEFT V1 non flow limiting dissection flap without pseudo aneurysm. Mild LEFT vertebral artery luminal irregularity. Mild calcific atherosclerosis RIGHT vertebral artery origin. Bilateral vertebral arteries are patent. SKELETON: No acute osseous process though bone windows have not been submitted. Poor dentition. OTHER NECK: Soft tissues of the neck are nonacute though, not tailored for evaluation. UPPER CHEST: Included lung apices are clear. No superior mediastinal lymphadenopathy. CTA HEAD FINDINGS: ANTERIOR CIRCULATION: Patent cervical internal  carotid arteries, petrous, cavernous and supra clinoid internal carotid arteries. Calcific atherosclerosis resulting in moderate to severe stenosis RIGHT para clinoid internal carotid artery. Bilateral 2 mm paraophthalmic outpouchings (axial 103/352). Patent anterior communicating artery. Patent anterior and middle cerebral arteries. Moderate tandem stenoses bilateral anterior cerebral arteries. Moderate stenosis RIGHT M1 origin. Moderate to severe stenosis distal RIGHT M2. Focally occluded versus severe stenosis proximal RIGHT M3 segment. Severe stenosis LEFT M2 inferior division origin. No large vessel occlusion, significant stenosis, contrast extravasation or aneurysm. POSTERIOR CIRCULATION: Patent vertebral arteries, vertebrobasilar junction and basilar artery, as well as main branch vessels with mild luminal irregularity. Moderate stenosis RIGHT V4 segment with mild poststenotic dilatation. Patent posterior cerebral arteries. Moderate to severe tandem stenoses RIGHT posterior cerebral artery. Moderate tandem stenosis LEFT posterior cerebral artery. No large vessel occlusion, significant stenosis, contrast extravasation or aneurysm. VENOUS SINUSES: Major dural venous sinuses are patent though not tailored for evaluation on this angiographic examination. ANATOMIC VARIANTS: None. DELAYED PHASE: Not performed. MIP images reviewed. CT Brain Perfusion Findings: CBF (<30%) Volume: 56mL Perfusion (Tmax>6.0s) volume: 47mL Mismatch Volume: 70mL Infarction Location:Perfusion mismatch bifrontal lobes, likely artifact. Prolonged mean transit time RIGHT basal ganglia and to lesser extent RIGHT MCA territory. IMPRESSION: CTA NECK: 1. No hemodynamically significant stenosis or acute vascular process in the neck. 2. 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 No pseudoaneurysm. 3. **An incidental finding of potential clinical significance has been found. Ectatic 3.6 cm ascending aorta. Recommend  annual imaging followup by CTA or MRA. This recommendation follows 2010 ACCF/AHA/AATS/ACR/ASA/SCA/SCAI/SIR/STS/SVM Guidelines for the Diagnosis and Management of Patients with Thoracic Aortic Disease. Circulation.2010; 121: K270-W237** CTA HEAD: 1. No emergent large vessel occlusion. Focally occluded RIGHT M2/M3 segment. 2. Advanced intracranial atherosclerosis with multifocal high-grade stenosis including severe stenosis LEFT M2 segment, moderate to severe stenoses RIGHT middle cerebral artery and LEFT posterior cerebral artery. 3. 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 CT PERFUSION: 1. No acute perfusion defect. 2. Prolonged mean transit time RIGHT MCA consistent with stenoses. Acute findings discussed with and reconfirmed by Dr.ASHISH ARORA on  11/25/2017 at 12:00 am. Electronically Signed   By: Elon Alas M.D.   On: 11/26/2017 00:03   Ct Head Code Stroke Wo Contrast  Result Date: 11/25/2017 CLINICAL DATA:  Code stroke. TIA. Initial evaluation. History of substance abuse, hypertension, hyperlipidemia, intracranial hemorrhage. EXAM: CT HEAD WITHOUT CONTRAST TECHNIQUE: Contiguous axial images were obtained from the base of the skull through the vertex without intravenous contrast. COMPARISON:  MRI of the head June 08, 2013 FINDINGS: BRAIN: No intraparenchymal hemorrhage, mass effect nor midline shift. Age indeterminate LEFT corona radiata to the basal ganglia infarct. Old bilateral basal ganglia infarcts. Ex vacuo dilatation RIGHT greater than LEFT lateral ventricles. 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 Mild global parenchymal brain volume loss for age without hydrocephalus. Confluent supratentorial white matter hypodensities. No acute large vascular territory infarct. No abnormal extra-axial fluid collections. Basal cisterns are patent. VASCULAR: Mildly dense cerebral artery's compatible with hemoconcentration. Mild calcific  atherosclerosis carotid siphon and included vertebral artery's. SKULL/SOFT TISSUES: No skull fracture. No significant soft tissue swelling. ORBITS/SINUSES: The included ocular globes and orbital contents are normal.Mild paranasal sinus mucosal thickening without air-fluid levels. Mastoid air cells are well aerated. OTHER: None. ASPECTS [facility] Stroke Program Early CT Score) - Ganglionic level infarction (caudate, lentiform nuclei, internal capsule, insula, M1-M3 cortex): 6 - Supraganglionic infarction (M4-M6 cortex): 3 Total score (0-10 with 10 being normal): 10 IMPRESSION: 1. Potentially acute LEFT corona radiata/basal ganglia infarct. 2. ASPECTS is 9. 3. Old bilateral basal ganglia infarcts. 4. Moderate to severe white matter changes compatible chronic small vessel ischemic disease. 5. Mild global parenchymal brain volume loss for age. Critical Value/emergent results were called by telephone at the time of interpretation on 11/25/2017 at 11:20 pm to Dr. Rory Percy, Neurology, who verbally acknowledged these results. Electronically Signed   By: Elon Alas M.D.   On: 11/25/2017 23:21    EKG: Independently reviewed. Normal sinus rhythm.   Assessment/Plan  1. 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 Hypertension  - Permit HTN in acute-phase of suspected ischemic stroke   3. Hypokalemia  - Serum potassium is 3.3 on admission  - KCl added to IVF,  repeat chem panel in am    DVT prophylaxis: Lovenox  Code Status: Full  Family Communication: Wife updated at bedside Disposition Plan: Observe on telemetry Consults called: Neurology Admission status: Observation    Vianne Bulls, MD [facility] Pager [phone]  If 7PM-7AM, please contact night-coverage [email or web address] Password [facility]  11/26/2017, 12:43 AM

## 2017-11-26 NOTE — Evaluation (Signed)
Physical Therapy Evaluation Patient Details Name: [name] MRN: [id number] DOB: 06/09/1961 Today's Date: 11/26/2017   History of Present Illness  57 y.o. male with a history of multiple prior strokes with residual weakness which is worse on the left, polysubstance abuse in the past, hypertension, hyperlipidemia, presented to the emergency room when his wife noted that he has been having trouble with his speech.  Clinical Impression  Orders received for PT evaluation. Patient demonstrates deficits in functional mobility as indicated below. Will benefit from continued skilled PT to address deficits and maximize function. Will see as indicated and progress as tolerated. Feel patient will need comprehensive therapies given magnitude of impairments, recommend CIR consult at this time.  OF NOTE: patient presenting with extreme difficulty managing secretions, Patient extremely diaphoretic and BP 168/105. HR elevated 130s to 150s limiting factor of session. Nsg aware.     Follow Up Recommendations CIR    Equipment Recommendations  (tbd)    Recommendations for Other Services Rehab consult     Precautions / Restrictions Precautions Precautions: Fall Precaution Comments: watch secretions      Mobility  Bed Mobility Overal bed mobility: Needs Assistance Bed Mobility: Rolling;Supine to Sit;Sit to Supine Rolling: Mod assist   Supine to sit: Max assist Sit to supine: Max assist   General bed mobility comments: Patient max assist for elevation to upright and to lower to supine, patient was able to assist with rolling  Transfers Overall transfer level: Needs assistance   Transfers: Sit to/from Stand Sit to Stand: Max assist         General transfer comment: Max assist to power up to standing, noted Left extensor tone in upright  Ambulation/Gait Ambulation/Gait assistance: Max assist Ambulation Distance (Feet): 2 Feet Assistive device: (wrap around maximal assist ) Gait  Pattern/deviations: Step-to pattern;Decreased stride length     General Gait Details: side steps to the [facility] (left) patient extremely hypertonic LLE and ataxic with difficulty placing LLE  Stairs            Wheelchair Mobility    Modified Rankin (Stroke Patients Only) Modified Rankin (Stroke Patients Only) Pre-Morbid Rankin Score: Moderate disability Modified Rankin: Severe disability     Balance Overall balance assessment: Needs assistance Sitting-balance support: Feet supported;Single extremity supported Sitting balance-Leahy Scale: Poor Sitting balance - Comments: patient with extreme difficulty maintaining sitting balance, right lateral lean and posterior bias secondary to extensor tone in left side, Postural control: Right lateral lean;Posterior lean   Standing balance-Leahy Scale: Zero                               Pertinent Vitals/Pain Pain Assessment: Faces Faces Pain Scale: No hurt    Home Living Family/patient expects to be discharged to:: Private residence     Type of Home: House         Home Equipment: Walker - 2 wheels Additional Comments: patient unable to verbally communicate at this time, provided some brief history but unable to completely obtain    Prior Function Level of Independence: Needs assistance   Gait / Transfers Assistance Needed: was using a Rw to mobilize since previous stroke           Hand Dominance   Dominant Hand: Right    Extremity/Trunk Assessment   Upper Extremity Assessment Upper Extremity Assessment: LUE deficits/detail LUE Deficits / Details: noted LUE flexor tone with synergy LUE Coordination: decreased fine motor;decreased gross motor  Lower Extremity Assessment Lower Extremity Assessment: LLE deficits/detail LLE Deficits / Details: noted LLE extensor tone with syngergy  LLE Coordination: decreased fine motor;decreased gross motor    Cervical / Trunk Assessment Cervical / Trunk  Assessment: (truncal ataxia and extensor tone)  Communication   Communication: No difficulties  Cognition Arousal/Alertness: Awake/alert Behavior During Therapy: WFL for tasks assessed/performed Overall Cognitive Status: Difficult to assess                                        General Comments      Exercises     Assessment/Plan    PT Assessment Patient needs continued PT services  PT Problem List Decreased strength;Decreased activity tolerance;Decreased balance;Decreased mobility;Decreased coordination;Cardiopulmonary status limiting activity;Impaired sensation;Impaired tone       PT Treatment Interventions DME instruction;Gait training;Functional mobility training;Therapeutic activities;Therapeutic exercise;Balance training;Neuromuscular re-education;Cognitive remediation;Patient/family education    PT Goals (Current goals can be found in the Care Plan section)  Acute Rehab PT Goals Patient Stated Goal: none stated PT Goal Formulation: With patient Time For Goal Achievement: 12/10/17 Potential to Achieve Goals: Fair    Frequency Min 4X/week   Barriers to discharge        Co-evaluation               AM-PAC PT "6 Clicks" Daily Activity  Outcome Measure Difficulty turning over in bed (including adjusting bedclothes, sheets and blankets)?: A Lot Difficulty moving from lying on back to sitting on the side of the bed? : Unable Difficulty sitting down on and standing up from a chair with arms (e.g., wheelchair, bedside commode, etc,.)?: Unable Help needed moving to and from a bed to chair (including a wheelchair)?: Total Help needed walking in hospital room?: Total Help needed climbing 3-5 steps with a railing? : Total 6 Click Score: 7    End of Session Equipment Utilized During Treatment: Gait belt;Oxygen Activity Tolerance: Treatment limited secondary to medical complications (Comment)(HR extremely elevated 150s with lowest rate of 130s  ) Patient left: in bed;with call [place]/phone within reach;with bed alarm set;with nursing/sitter in room Nurse Communication: Mobility status PT Visit Diagnosis: Ataxic gait (R26.0);Other symptoms and signs involving the nervous system (R29.898)    Time: 7681-1572 PT Time Calculation (min) (ACUTE ONLY): 22 min   Charges:   PT Evaluation $PT Eval Moderate Complexity: 1 Mod     PT G Codes:        Alben Deeds, PT DPT  Board Certified Neurologic Specialist [place] 11/26/2017, 12:12 PM

## 2017-11-26 NOTE — Evaluation (Signed)
Clinical/Bedside Swallow Evaluation Patient Details  Name: [name] MRN: [id number] Date of Birth: April 20, 1961  Today's Date: 11/26/2017 Time: SLP Start Time (ACUTE ONLY): 1230 SLP Stop Time (ACUTE ONLY): 1245 SLP Time Calculation (min) (ACUTE ONLY): 15 min  Past Medical History:  Past Medical History:  Diagnosis Date  . Arthritis   . Chest pain    with a negative adenosine cardiolite in December 2006. Dr. Liana Crocker   . 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 Diastolic dysfunction    impaired L ventricle relatation with an EF of 65% and echo in December 2005  . Erectile dysfunction   . Hyperlipidemia    elevated transaminases on statin?-40's (2/06) - Hep B/C negative 4/06; resolution on 4/06 labs; off statin in 2006  . Hypertension    concentric LVH 2d echo 12/05; negative proteinuria 11/00  . 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 Onychomycosis April 2003   right big toe  . Prostate disorder    prostate irregularity with obstructive voiding symptoms (PSA 0.67); -likely related to overstimulation of alpha receptors at bladder neck; seen by Dr. Reece Agar in 9/05. no BPH at that visit  . Tinea versicolor    dermatology referral; secondary to no treatement response to selenium sulfide; currently resolive   Past Surgical History:  Past Surgical History:  Procedure Laterality Date  . COLONOSCOPY WITH PROPOFOL N/A 05/22/2013   Procedure: COLONOSCOPY WITH PROPOFOL;  Surgeon: Arta Silence, MD;  Location: [facility];  Service: Endoscopy;  Laterality: N/A;   HPI:  57 y.o. male with a history of multiple prior strokes with residual  weakness which is worse on the left, polysubstance abuse in the past, hypertension, hyperlipidemia, presented to the emergency room when his wife noted that he has been having trouble with his speech.   Assessment / Plan / Recommendation Clinical Impression  PO not administered due to pt's aspiration risk. As SLP entered room, pt noted with copious anterior loss of secretions, wet, gurgling respirations. Pt attempting to self-suction however he requires assistance to extract secretions from within oral cavity. Palatal vibration noted with inspiration, suggestive of CN X involvement. With max cues, pt is able to initiate 2 volitional swallows with extended time, 10-15 seconds after cues. Volitional swallow followed by wheezing, wet breath sounds. Pt with anarthria, but gesturing to his chest with labored breathing noted. At this time pt is at high risk for aspiration. His poor management of secretions concerning for airway protection. Recommend he remain NPO with close monitoring and frequent/continuous suctioning of oral secretions. Discussed with RN. MD paged.  SLP Visit Diagnosis: Dysphagia, unspecified (R13.10)    Aspiration Risk  Severe aspiration risk    Diet Recommendation NPO        Other  Recommendations Oral Care Recommendations: Oral care QID;Other (Comment)(frequent oral suctioning) Other Recommendations: Have oral suction available   Follow up Recommendations Inpatient Rehab      Frequency and Duration min 3x week  2 weeks       Prognosis Prognosis for Safe Diet Advancement: Good Barriers to Reach Goals: Cognitive deficits;Language deficits      Swallow Study   General Date of Onset: 11/25/17  HPI: 57 y.o. male with a history of multiple prior strokes with residual weakness which is worse on the left, polysubstance abuse in the past, hypertension, hyperlipidemia, presented to the emergency room when his wife noted that he has been having trouble with his speech. Type of  Study: Bedside Swallow Evaluation Previous Swallow Assessment: none in chart; per SLP cognitive-linguistic evaluation 05/2013 pt has mild drooling from left when tired at baseline Diet Prior to this Study: NPO Temperature Spikes Noted: Yes(99.8) Respiratory Status: Nasal cannula History of Recent Intubation: No Behavior/Cognition: Alert;Cooperative;Distractible Oral Cavity Assessment: Excessive secretions Oral Care Completed by SLP: No Oral Cavity - Dentition: Adequate natural dentition Vision: Functional for self-feeding Self-Feeding Abilities: Other (Comment)(PO not assessed due to pt not managing secretions) Patient Positioning: Upright in bed Baseline Vocal Quality: Not observed Volitional Cough: Cognitively unable to elicit Volitional Swallow: Able to elicit(prolonged; 94-07 seconds after cues)    Oral/Motor/Sensory Function Overall Oral Motor/Sensory Function: Severe impairment Facial ROM: Reduced right;Reduced left Lingual ROM: Reduced right;Reduced left Lingual Strength: Reduced Velum: Other (comment);Suspected CN X (Vagus) dysfunction(infer impairment; soft palate vibration with inspiration) Mandible: Impaired;Suspected CN V (Trigeminal) dysfunction   Ice Chips Ice chips: Not tested   Thin Liquid Thin Liquid: Not tested    Nectar Thick Nectar Thick Liquid: Not tested   Honey Thick Honey Thick Liquid: Not tested   Puree Puree: Not tested   Solid   GO   Solid: Not tested       Deneise Lever, MS, CCC-SLP Speech-Language Pathologist [phone]  Aliene Altes 11/26/2017,1:14 PM

## 2017-11-26 NOTE — Progress Notes (Signed)
On initial assessment this am, patient appears to have complete expressive aphasia with guttural respirations and difficulty mobilizing secretions.  Appears to have difficulty with soft muscle utilization in his posterior palate.  O2 sat 100. 208/114.  Pulse = 127.

## 2017-11-26 NOTE — Progress Notes (Signed)
[name] is a 57 yo male with a history of HTN, hyperlipidemia, and CVA with residual weakness. He presents to ED via private vehicle with acute onset of speech difficulty. Pt states he was "unable to get his words out" states he knew what he wanted to say. Symptoms resolved. Admitted to [facility] for further evaluation. LKW 2000, NIH 1, CBG 87

## 2017-11-26 NOTE — Progress Notes (Addendum)
Dr. Algis Liming in to re-assess patient, who has declined neurologically, requiring increased oral suctioning in order to maintain his airway safely.  Has total aphasia at this point.  Only able to sit bolt upright in order to mobilize secretions and prevent aspiration.  LUE rigid.  Has BLE leg spasms.  Appears to comprehend what is being said to him AEB following commands appropriately.

## 2017-11-26 NOTE — Progress Notes (Signed)
STROKE TEAM PROGRESS NOTE   SUBJECTIVE (INTERVAL HISTORY) We were called by the nurse to see the patient for difficulty handling secretions and upper airway difficulties. The patient's wife was at the bedside. The patient remains aphasic. He was able to perform self suctioning. His respiratory status improved when sitting up. O2 sats were 100% on room air. ABGs were ordered. NG tube and nasal trumpet to be considered; however, the patient indicated that he did not want either. The patient remains NPO.   Past Medical History:  Diagnosis Date  . Arthritis   . Chest pain    with a negative adenosine cardiolite in December 2006. Dr. Liana Crocker   . Cocaine abuse ([place])    last used in 2000; currently in remission; no IVDU to my knowledge  . Diastolic dysfunction    impaired L ventricle relatation with an EF of 65% and echo in December 2005  . Erectile dysfunction   . Hyperlipidemia    elevated transaminases on statin?-40's (2/06) - Hep B/C negative 4/06; resolution on 4/06 labs; off statin in 2006  . Hypertension    concentric LVH 2d echo 12/05; negative proteinuria 11/00  . 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 Onychomycosis April 2003   right big toe  . Prostate disorder    prostate irregularity with obstructive voiding symptoms (PSA 0.67); -likely related to overstimulation of alpha receptors at bladder neck; seen by Dr. Reece Agar in 9/05. no BPH at that visit  . Tinea versicolor    dermatology referral; secondary to no treatement response to selenium sulfide; currently resolive    HOME MEDICATIONS:  Current Meds  Medication Sig  . amLODipine (NORVASC) 10 MG tablet Take 1 tablet (10 mg  total) by mouth every morning.  Marland Kitchen aspirin 81 MG tablet Take 1 tablet (81 mg total) by mouth daily.  . hydrochlorothiazide (HYDRODIURIL) 25 MG tablet Take 1 tablet (25 mg total) by mouth every morning.  Marland Kitchen lisinopril (PRINIVIL,ZESTRIL) 20 MG tablet Take 1 tablet (20 mg total) by mouth daily.  . Multiple Vitamins-Minerals (HM MULTIVITAMIN ADULT GUMMY PO) Take 1 tablet by mouth every morning.     HOSPITAL MEDICATIONS:  . atorvastatin  80 mg Oral q1800  . clopidogrel  75 mg Oral Daily  . enoxaparin (LOVENOX) injection  40 mg Subcutaneous Q24H    OBJECTIVE Temp:  [98.5 F (36.9 C)] 98.5 F (36.9 C) (01/13 0435) Pulse Rate:  [86-93] 93 (01/13 0435) Cardiac Rhythm: Sinus tachycardia (01/13 0753) Resp:  [16-25] 16 (01/13 0435) BP: (163-167)/(106-125) 167/107 (01/13 0435) SpO2:  [93 %-99 %] 98 % (01/13 0435) Weight:  [179 lb 14.3 oz (81.6 kg)] 179 lb 14.3 oz (81.6 kg) (01/12 2338)  CBC:  Recent Labs  Lab 11/25/17 2246 11/25/17 2259  WBC 3.8*  --   NEUTROABS 1.7  --   HGB 14.1 15.0  HCT 41.9 44.0  MCV 70.1*  --   PLT 242  --     Basic Metabolic Panel:  Recent Labs  Lab 11/25/17 2246 11/25/17 2259  NA 136 142  K 3.3* 3.3*  CL 102 102  CO2 23  --   GLUCOSE 94 99  BUN 13 14  CREATININE 1.30* 1.30*  CALCIUM 9.1  --  Lipid Panel:     Component Value Date/Time   CHOL 295 (H) 05/29/2017 1537   TRIG 57 05/29/2017 1537   HDL 60 05/29/2017 1537   CHOLHDL 4.9 05/29/2017 1537   CHOLHDL 4.7 02/13/2015 1117   VLDL 18 02/13/2015 1117   LDLCALC 224 (H) 05/29/2017 1537   HgbA1c:  Lab Results  Component Value Date   HGBA1C 5.8 (H) 06/08/2013   Urine Drug Screen:     Component Value Date/Time   LABOPIA NONE DETECTED 11/26/2017 0047   COCAINSCRNUR NONE DETECTED 11/26/2017 0047   LABBENZ NONE DETECTED 11/26/2017 0047   AMPHETMU NONE DETECTED 11/26/2017 0047   THCU NONE DETECTED 11/26/2017 0047   LABBARB NONE DETECTED 11/26/2017 0047    Alcohol Level     Component  Value Date/Time   ETH <11 06/07/2013 1731    IMAGING I have personally reviewed the radiological images below and agree with the radiology interpretations.  CTA NECK:  1. No hemodynamically significant stenosis or acute vascular process in the neck.  2. 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 No pseudoaneurysm.  3. An incidental finding of potential clinical significance has been found.  Ectatic 3.6 cm ascending aorta.  Recommend annual imaging followup by CTA or MRA. This recommendation follows 2010 ACCF/AHA/AATS/ACR/ASA/SCA/SCAI/SIR/STS/SVM Guidelines for the Diagnosis and Management of Patients with Thoracic Aortic Disease. Circulation.2010; 121: K481-E563  CTA HEAD:  1. No emergent large vessel occlusion. Focally occluded RIGHT M2/M3 segment.  2. Advanced intracranial atherosclerosis with multifocal high-grade stenosis including severe stenosis LEFT M2 segment, moderate to severe stenoses RIGHT middle cerebral artery and LEFT posterior cerebral artery.  3. 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   CT PERFUSION:  1. No acute perfusion defect.  2. Prolonged mean transit time RIGHT MCA consistent with stenoses.   CT Head Wo Contrast 11/25/2017 IMPRESSION: 1. Potentially acute LEFT corona radiata/basal ganglia infarct. 2. ASPECTS is 9. 3. Old bilateral basal ganglia infarcts. 4. Moderate to severe white matter changes compatible chronic small vessel ischemic disease. 5. Mild global parenchymal brain volume loss for age.  TTE - pending   PHYSICAL EXAM  Temp:  [98.5 F (36.9 C)-99.8 F (37.7 C)] 99.8 F (37.7 C) (01/13 1016) Pulse Rate:  [86-125] 106 (01/13 1016) Resp:  [16-25] 18 (01/13 1016) BP: (154-180)/(106-125) 154/107 (01/13 1016) SpO2:  [93 %-100 %] 99 % (01/13 1016) Weight:  [179 lb 14.3 oz (81.6 kg)] 179 lb 14.3 oz (81.6 kg) (01/12 2338)  General - Well nourished, well developed, SOB with snoring  sound on supine position.  Ophthalmologic - Fundi not visualized due to distress.  Cardiovascular - Regular rate and rhythm.  Mental Status -  Awake, alert, answer questions with gesture and nod/shake head. Asarthria, not able to get words out. Snoring sound. Follows simple commands.  Cranial Nerves II - XII - II - Visual field intact OU. III, IV, VI - Extraocular movements intact. V - Facial sensation intact bilaterally. VII - Facial movement intact bilaterally. VIII - Hearing & vestibular intact bilaterally. X - not able to open mouth enough except yawning, no tongue swelling. XI - Chin turning & shoulder shrug intact bilaterally. XII - Tongue protrusion not able to perform  Motor Strength - The patient's strength was normal in RUE and RLE, however, on LUE 2/5 and RLE 2/5 with significant spasticity. Bulk was normal and fasciculations were absent.   Motor Tone - Muscle tone was significantly spasticity at left, LUE flexion and LLE extension.  Reflexes - The patient's reflexes were 4+ at LLE with sustained ankle clonus, 3+ LUE and he had left babinski.  Sensory - not cooperative due to respiratory distress.    Coordination - no cooperative due to respiratory distress. Frequent LLE episodic spasm  Gait and Station - not tested   ASSESSMENT/PLAN [name] is a 57 y.o. 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 asarthria. He did not receive IV t-PA due to initial improvement in deficits and late presentation.  Stroke: suspected brainstem vs. b/l subcortical involvement - MRI to be performed when pt can lie flat. Stroke etiology likely due to atherosclerosis.  Resultant asarthria, severe dysphagia  CT head - Possible acute LEFT corona radiata/basal ganglia  infarct.  MRI head - pending once pt can lie flat  CTA head and neck - right M1, M2, and left M2 stenosis  2D Echo - pending  EEG - pending  LDL - 251  HgbA1c - pending  UDS - negative   VTE prophylaxis - Lovenox  Diet NPO time specified  aspirin 81 mg daily prior to admission, now on clopidogrel 75 mg daily substitute aspirin suppository until PO access is available for Plavix  Patient will be counseled to be compliant with his antithrombotic medications  Ongoing aggressive stroke risk factor management  Therapy recommendations:  pending  Disposition:  Pending  Respiratory distress  Due to bulbar or pseudobulbar   ABG stat - WNL  Nasal trumpet consideration if needed  Close monitoring for respiratory issue  Transfer to step down or ICU if needed.  Frequent suctioning  Aspiration precaution  Asarthria and dysphagia - fixed deficit with no more fluctuation since 2am today  Bulbar or pseudobulbar  Speech on board  Did not pass swallow  On NG tube  History of stroke  First stroke in 1999 as per wife  06/2004 CT head showed old right BG encephalomalacia  12/2006 left BG ICH, MRA h/n unremarkable  12/6946 - left PLIC and CR infarct   Hypertension  Blood pressures running high but within parameters for permissive hypertension  Permissive hypertension (OK if < 220/120) but gradually normalize in 5-7 days  Long-term BP goal normotensive  Hyperlipidemia  Home meds: lipitor 20.  LDL 251, goal < 70  Now on Lipitor 80 mg daily  Continue statin at discharge  Other Stroke Risk Factors  Former cigarette smoker - quit  ETOH use, advised to drink no more than 1 drink per day.  Hx of substance abuse  Other Active Problems  Hypokalemia - supplement - recheck  Mildly elevated creatinine - 1.3  History of polysubstance abuse - UDS negative   Hospital day # 0  I spent  35 minutes in total face-to-face time with the patient, more than 50% of  which was spent in counseling and coordination of care, reviewing test results, images and medication, and discussing the diagnosis of asarthria and dysphagia, respiratory distress, treatment plan and potential prognosis. This patient's care requiresreview of multiple databases, neurological assessment, discussion with family, other specialists and medical decision making of high complexity. Case discussed with Dr. Augusto Garbe, MD PhD Stroke Neurology 11/26/2017 6:22 PM   To contact Stroke Continuity provider, please refer to [email or web address] After hours, contact General Neurology

## 2017-11-26 NOTE — Progress Notes (Addendum)
SLP Cancellation Note  Patient Details Name: [name] MRN: [id number] DOB: Jan 17, 1961   Cancelled treatment:       Reason Eval/Treat Not Completed: Other (comment) . Orders for cognitive-linguistic evaluation received. Per charting, pt failed stroke swallow screen. SLP does not have orders for swallow evaluation. Please place orders for swallow evaluation if needed.  Deneise Lever, [place], CCC-SLP Speech-Language Pathologist   Aliene Altes 11/26/2017, 11:23 AM

## 2017-11-26 NOTE — Progress Notes (Signed)
Addendum  Came back to bedside to reassess. Patient now with NGT. C/O throat pain. Noisy upper airway breathing (some better than this morning) but not in respiratory distress. RS with transmitted sounds from upper airway. Examined along with Stroke MD at bedside.   Possible bulbar or pseudobulbar palsy. Very concerned re his ability to manage secretions and protect his airway. Hence discussed with PCCM MD and will transfer patient to ICU for close monitoring and if worsens, may need mechanical ventilation. D/W nursing and house coverage. D/W patient and son at bedside. Updated care and answered questions.  Vernell Leep, MD, FACP, [facility]. [facility] Pager [phone]  If 7PM-7AM, please contact night-coverage [email or web address] Password [facility] 11/26/2017, 6:02 PM

## 2017-11-26 NOTE — ED Notes (Signed)
Neurology notified about high BP, per Neurology no new order for BP medication at this time.

## 2017-11-26 NOTE — Procedures (Signed)
Endotracheal Intubation Procedure Note Indication for endotracheal intubation: airway compromise and impending respiratory failure Sedation: etomidate and midazolam Paralytic: succinylcholine Equipment: Macintosh 3 laryngoscope blade and 7.74mm cuffed endotracheal tube Cricoid Pressure: yes Number of attempts: 1 ETT location confirmed by by auscultation, by CXR and ETCO2 monitor.   Called for emergent intubation on patient moved from floor to ICU not protecting his airway, with accessory muscle use and respiratory distress. Obtained verbal consent for procedure from patient's wife who is at bedside. Patient tolerated procedure well no complications.

## 2017-11-26 NOTE — Consult Note (Signed)
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 Head CT showed possible acute Left basal ganglia infarct. 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 Neurology was consulted and was concerned for possible brainstem vs b/l subcortical infarct. Today at 8:36am RN noted that he was having difficulty mobilizing his secretions and was tachycardic. Neuro was alerted and ordered ABG which ws normal. At 9:06am RN noted patient to be diaphoretic and stating that he felt "exhausted from effort to breathe." Speech evaluated patient and paged MD over concern for airway protection. Patient transferred to SDU for closer monitoring. On re-eval in afternoon patient still having difficulty managing secretions and hospitalist consulted PCCM at 6pm. Patient transferred to ICU but daytime PCCM did not see patient. ICU RT alerted nighttime PCCM that patient needed emergent intubation. I rapidly assessed patient at bedside and found him to have loud rhonchi b/l with tachypnea and accessory muscle use, tachycardia, significant diaphoresis. Asked him to cough and he had a very weak cough. Discussed intubation with his wife who was at bedside and was agreeable. Emergently intubated patient (see separate procedure note). Patient tolerated it well.   PAST MEDICAL HISTORY :  He  has a past medical history of Arthritis, Chest pain, Cocaine abuse ([place]), Diastolic dysfunction, Erectile dysfunction, Hyperlipidemia, Hypertension, Intracranial hemorrhage ([place]), Onychomycosis (April 2003), Prostate disorder,  and Tinea versicolor.  PAST SURGICAL HISTORY: He  has a past surgical history that includes Colonoscopy with propofol (N/A, 05/22/2013).  No Known Allergies  No current facility-administered medications on file prior to encounter.    Current Outpatient Medications on File Prior to Encounter  Medication Sig  . amLODipine (NORVASC) 10 MG tablet Take 1 tablet (10 mg total) by mouth every morning.  Marland Kitchen aspirin 81 MG tablet Take 1 tablet (81 mg total) by mouth daily.  . hydrochlorothiazide (HYDRODIURIL) 25 MG tablet Take 1 tablet (25 mg total) by mouth every morning.  Marland Kitchen lisinopril (PRINIVIL,ZESTRIL) 20 MG tablet Take 1 tablet (20 mg total) by mouth daily.  . Multiple Vitamins-Minerals (HM MULTIVITAMIN ADULT GUMMY PO) Take 1 tablet by mouth every morning.  Marland Kitchen atorvastatin (LIPITOR) 20 MG tablet Take 1 tablet (20 mg total) by mouth daily. (Patient not taking: Reported on 11/25/2017)   FAMILY HISTORY:  His indicated that his mother is alive. He indicated that his father is deceased.  SOCIAL HISTORY: He  reports that  has never smoked. he has never used smokeless tobacco. He reports that he drinks alcohol. He reports that he does not use drugs.  REVIEW OF SYSTEMS:   Review of Systems  Unable to perform ROS: Critical illness   SUBJECTIVE:  Respiratory distress, weak cough, accessory muscle use  VITAL SIGNS: BP (!) 187/136   Pulse (!) 114   Temp 99.8 F (37.7 C) (Axillary)   Resp (!) 27   Ht 5\' 7"  (1.702 m)   Wt 81.6 kg (179 lb 14.3 oz)   SpO2 96%   BMI 28.18 kg/m   VENTILATOR SETTINGS:  PRVC, R 16, FIO2 100%, PEEP 5, Vt 8cc/kg  INTAKE / OUTPUT: I/O  last 3 completed shifts: In: 546.3 [I.V.:246.3; IV Piggyback:300] Out: 450 [Urine:450]  PHYSICAL EXAMINATION: General: WDWN adult male, in respiratory distress Neuro: awake, slightly somnolent, able to follow commands, very weak cough, PERRL HEENT: OP clear, MM moist, copious oral secretions pooling in mouth Cardiovascular:  Tachycardic with a regular rhythm, no m/r/g Lungs: Loud rhonchi b/l, Accessory muscle use and respiratory distress. Very weak cough  Abdomen:  Soft NTND, BS+ Musculoskeletal: no LE edema GU: condom catheter in place  Skin: no rashes   LABS:  BMET Recent Labs  Lab 11/25/17 2246 11/25/17 2259 11/26/17 0735  NA 136 142 138  K 3.3* 3.3* 3.2*  CL 102 102 103  CO2 23  --  23  BUN 13 14 9   CREATININE 1.30* 1.30* 1.39*  GLUCOSE 94 99 119*   Electrolytes Recent Labs  Lab 11/25/17 2246 11/26/17 0735  CALCIUM 9.1 9.3  MG  --  1.9   CBC Recent Labs  Lab 11/25/17 2246 11/25/17 2259 11/26/17 0735  WBC 3.8*  --  5.2  HGB 14.1 15.0 13.9  HCT 41.9 44.0 41.8  PLT 242  --  249   Coag's Recent Labs  Lab 11/25/17 2246  APTT 30  INR 0.98   Sepsis Markers No results for input(s): LATICACIDVEN, PROCALCITON, O2SATVEN in the last 168 hours.  ABG Recent Labs  Lab 11/26/17 0920  PHART 7.424  PCO2ART 40.1  PO2ART 122*   Liver Enzymes Recent Labs  Lab 11/25/17 2246  AST 27  ALT 21  ALKPHOS 92  BILITOT 1.0  ALBUMIN 3.9   Cardiac Enzymes No results for input(s): TROPONINI, PROBNP in the last 168 hours.  Glucose Recent Labs  Lab 11/25/17 2256  GLUCAP 21   Imaging Ct Angio Head W Or Wo Contrast  Result Date: 11/26/2017 CLINICAL DATA:  Follow-up code stroke. History of stroke, substance abuse, hypertension, hyperlipidemia. EXAM: CT ANGIOGRAPHY HEAD AND NECK CT PERFUSION BRAIN TECHNIQUE: Multidetector CT imaging of the head and neck was performed using the standard protocol during bolus administration of intravenous contrast. Multiplanar CT image reconstructions and MIPs were obtained to evaluate the vascular anatomy. Carotid stenosis measurements (when applicable) are obtained utilizing NASCET criteria, using the distal internal carotid diameter as the denominator. Multiphase CT imaging of the brain was performed following IV bolus contrast injection. Subsequent  parametric perfusion maps were calculated using RAPID software. CONTRAST:  129mL ISOVUE-370 IOPAMIDOL (ISOVUE-370) INJECTION 76% COMPARISON:  CT HEAD November 25, 2017 at 11:11 p.m. MRA head and neck January 07, 2007 FINDINGS: CTA NECK FINDINGS: AORTIC ARCH: 3.6 cm ascending aorta, normal branch pattern. Mild intimal thickening aortic arch. The origins of the innominate, left Common carotid artery and subclavian artery are widely patent. Mild stenosis mid LEFT subclavian artery. RIGHT CAROTID SYSTEM: Common carotid artery is widely patent, coursing in a straight line fashion. Normal appearance of the carotid bifurcation without hemodynamically significant stenosis by NASCET criteria. Tortuous course associated with hypertension. LEFT CAROTID SYSTEM: Common carotid artery is widely patent, coursing in a straight line fashion. Normal appearance of the carotid bifurcation without hemodynamically significant stenosis by NASCET criteria. Tortuous course associated with hypertension, widely patent; mild luminal irregularity most compatible with atherosclerosis. VERTEBRAL ARTERIES:New, age indeterminate LEFT V1 non flow limiting dissection flap without pseudo aneurysm. Mild LEFT vertebral artery luminal irregularity. Mild calcific atherosclerosis RIGHT vertebral artery origin. Bilateral vertebral arteries are patent. SKELETON: No acute osseous process though bone windows have not been submitted. Poor dentition. OTHER NECK: Soft tissues of the neck are  nonacute though, not tailored for evaluation. UPPER CHEST: Included lung apices are clear. No superior mediastinal lymphadenopathy. CTA HEAD FINDINGS: ANTERIOR CIRCULATION: Patent cervical internal carotid arteries, petrous, cavernous and supra clinoid internal carotid arteries. Calcific atherosclerosis resulting in moderate to severe stenosis RIGHT para clinoid internal carotid artery. Bilateral 2 mm paraophthalmic outpouchings (axial 103/352). Patent anterior communicating  artery. Patent anterior and middle cerebral arteries. Moderate tandem stenoses bilateral anterior cerebral arteries. Moderate stenosis RIGHT M1 origin. Moderate to severe stenosis distal RIGHT M2. Focally occluded versus severe stenosis proximal RIGHT M3 segment. Severe stenosis LEFT M2 inferior division origin. No large vessel occlusion, significant stenosis, contrast extravasation or aneurysm. POSTERIOR CIRCULATION: Patent vertebral arteries, vertebrobasilar junction and basilar artery, as well as main branch vessels with mild luminal irregularity. Moderate stenosis RIGHT V4 segment with mild poststenotic dilatation. Patent posterior cerebral arteries. Moderate to severe tandem stenoses RIGHT posterior cerebral artery. Moderate tandem stenosis LEFT posterior cerebral artery. No large vessel occlusion, significant stenosis, contrast extravasation or aneurysm. VENOUS SINUSES: Major dural venous sinuses are patent though not tailored for evaluation on this angiographic examination. ANATOMIC VARIANTS: None. DELAYED PHASE: Not performed. MIP images reviewed. CT Brain Perfusion Findings: CBF (<30%) Volume: 61mL Perfusion (Tmax>6.0s) volume: 57mL Mismatch Volume: 50mL Infarction Location:Perfusion mismatch bifrontal lobes, likely artifact. Prolonged mean transit time RIGHT basal ganglia and to lesser extent RIGHT MCA territory. IMPRESSION: CTA NECK: 1. No hemodynamically significant stenosis or acute vascular process in the neck. 2. 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 No pseudoaneurysm. 3. **An incidental finding of potential clinical significance has been found. Ectatic 3.6 cm ascending aorta. Recommend annual imaging followup by CTA or MRA. This recommendation follows 2010 ACCF/AHA/AATS/ACR/ASA/SCA/SCAI/SIR/STS/SVM Guidelines for the Diagnosis and Management of Patients with Thoracic Aortic Disease. Circulation.2010; 121: U132-G401** CTA HEAD: 1. No emergent large vessel occlusion.  Focally occluded RIGHT M2/M3 segment. 2. Advanced intracranial atherosclerosis with multifocal high-grade stenosis including severe stenosis LEFT M2 segment, moderate to severe stenoses RIGHT middle cerebral artery and LEFT posterior cerebral artery. 3. 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 CT PERFUSION: 1. No acute perfusion defect. 2. Prolonged mean transit time RIGHT MCA consistent with stenoses. Acute findings discussed with and reconfirmed by Dr.ASHISH ARORA on 11/25/2017 at 12:00 am. Electronically Signed   By: Elon Alas M.D.   On: 11/26/2017 00:03   Ct Angio Neck W Or Wo Contrast  Result Date: 11/26/2017 CLINICAL DATA:  Follow-up code stroke. History of stroke, substance abuse, hypertension, hyperlipidemia. EXAM: CT ANGIOGRAPHY HEAD AND NECK CT PERFUSION BRAIN TECHNIQUE: Multidetector CT imaging of the head and neck was performed using the standard protocol during bolus administration of intravenous contrast. Multiplanar CT image reconstructions and MIPs were obtained to evaluate the vascular anatomy. Carotid stenosis measurements (when applicable) are obtained utilizing NASCET criteria, using the distal internal carotid diameter as the denominator. Multiphase CT imaging of the brain was performed following IV bolus contrast injection. Subsequent parametric perfusion maps were calculated using RAPID software. CONTRAST:  135mL ISOVUE-370 IOPAMIDOL (ISOVUE-370) INJECTION 76% COMPARISON:  CT HEAD November 25, 2017 at 11:11 p.m. MRA head and neck January 07, 2007 FINDINGS: CTA NECK FINDINGS: AORTIC ARCH: 3.6 cm ascending aorta, normal branch pattern. Mild intimal thickening aortic arch. The origins of the innominate, left Common carotid artery and subclavian artery are widely patent. Mild stenosis mid LEFT subclavian artery. RIGHT CAROTID SYSTEM: Common carotid artery is widely patent, coursing in a straight line fashion. Normal appearance of the carotid bifurcation without  hemodynamically significant  stenosis by NASCET criteria. Tortuous course associated with hypertension. LEFT CAROTID SYSTEM: Common carotid artery is widely patent, coursing in a straight line fashion. Normal appearance of the carotid bifurcation without hemodynamically significant stenosis by NASCET criteria. Tortuous course associated with hypertension, widely patent; mild luminal irregularity most compatible with atherosclerosis. VERTEBRAL ARTERIES:New, age indeterminate LEFT V1 non flow limiting dissection flap without pseudo aneurysm. Mild LEFT vertebral artery luminal irregularity. Mild calcific atherosclerosis RIGHT vertebral artery origin. Bilateral vertebral arteries are patent. SKELETON: No acute osseous process though bone windows have not been submitted. Poor dentition. OTHER NECK: Soft tissues of the neck are nonacute though, not tailored for evaluation. UPPER CHEST: Included lung apices are clear. No superior mediastinal lymphadenopathy. CTA HEAD FINDINGS: ANTERIOR CIRCULATION: Patent cervical internal carotid arteries, petrous, cavernous and supra clinoid internal carotid arteries. Calcific atherosclerosis resulting in moderate to severe stenosis RIGHT para clinoid internal carotid artery. Bilateral 2 mm paraophthalmic outpouchings (axial 103/352). Patent anterior communicating artery. Patent anterior and middle cerebral arteries. Moderate tandem stenoses bilateral anterior cerebral arteries. Moderate stenosis RIGHT M1 origin. Moderate to severe stenosis distal RIGHT M2. Focally occluded versus severe stenosis proximal RIGHT M3 segment. Severe stenosis LEFT M2 inferior division origin. No large vessel occlusion, significant stenosis, contrast extravasation or aneurysm. POSTERIOR CIRCULATION: Patent vertebral arteries, vertebrobasilar junction and basilar artery, as well as main branch vessels with mild luminal irregularity. Moderate stenosis RIGHT V4 segment with mild poststenotic dilatation. Patent  posterior cerebral arteries. Moderate to severe tandem stenoses RIGHT posterior cerebral artery. Moderate tandem stenosis LEFT posterior cerebral artery. No large vessel occlusion, significant stenosis, contrast extravasation or aneurysm. VENOUS SINUSES: Major dural venous sinuses are patent though not tailored for evaluation on this angiographic examination. ANATOMIC VARIANTS: None. DELAYED PHASE: Not performed. MIP images reviewed. CT Brain Perfusion Findings: CBF (<30%) Volume: 79mL Perfusion (Tmax>6.0s) volume: 68mL Mismatch Volume: 58mL Infarction Location:Perfusion mismatch bifrontal lobes, likely artifact. Prolonged mean transit time RIGHT basal ganglia and to lesser extent RIGHT MCA territory. IMPRESSION: CTA NECK: 1. No hemodynamically significant stenosis or acute vascular process in the neck. 2. 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 No pseudoaneurysm. 3. **An incidental finding of potential clinical significance has been found. Ectatic 3.6 cm ascending aorta. Recommend annual imaging followup by CTA or MRA. This recommendation follows 2010 ACCF/AHA/AATS/ACR/ASA/SCA/SCAI/SIR/STS/SVM Guidelines for the Diagnosis and Management of Patients with Thoracic Aortic Disease. Circulation.2010; 121: Z601-U932** CTA HEAD: 1. No emergent large vessel occlusion. Focally occluded RIGHT M2/M3 segment. 2. Advanced intracranial atherosclerosis with multifocal high-grade stenosis including severe stenosis LEFT M2 segment, moderate to severe stenoses RIGHT middle cerebral artery and LEFT posterior cerebral artery. 3. 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 CT PERFUSION: 1. No acute perfusion defect. 2. Prolonged mean transit time RIGHT MCA consistent with stenoses. Acute findings discussed with and reconfirmed by Dr.ASHISH ARORA on 11/25/2017 at 12:00 am. Electronically Signed   By: Elon Alas M.D.   On: 11/26/2017 00:03   Ct Cerebral Perfusion W  Contrast  Result Date: 11/26/2017 CLINICAL DATA:  Follow-up code stroke. History of stroke, substance abuse, hypertension, hyperlipidemia. EXAM: CT ANGIOGRAPHY HEAD AND NECK CT PERFUSION BRAIN TECHNIQUE: Multidetector CT imaging of the head and neck was performed using the standard protocol during bolus administration of intravenous contrast. Multiplanar CT image reconstructions and MIPs were obtained to evaluate the vascular anatomy. Carotid stenosis measurements (when applicable) are obtained utilizing NASCET criteria, using the distal internal carotid diameter as the denominator. Multiphase CT imaging of the brain was performed  following IV bolus contrast injection. Subsequent parametric perfusion maps were calculated using RAPID software. CONTRAST:  185mL ISOVUE-370 IOPAMIDOL (ISOVUE-370) INJECTION 76% COMPARISON:  CT HEAD November 25, 2017 at 11:11 p.m. MRA head and neck January 07, 2007 FINDINGS: CTA NECK FINDINGS: AORTIC ARCH: 3.6 cm ascending aorta, normal branch pattern. Mild intimal thickening aortic arch. The origins of the innominate, left Common carotid artery and subclavian artery are widely patent. Mild stenosis mid LEFT subclavian artery. RIGHT CAROTID SYSTEM: Common carotid artery is widely patent, coursing in a straight line fashion. Normal appearance of the carotid bifurcation without hemodynamically significant stenosis by NASCET criteria. Tortuous course associated with hypertension. LEFT CAROTID SYSTEM: Common carotid artery is widely patent, coursing in a straight line fashion. Normal appearance of the carotid bifurcation without hemodynamically significant stenosis by NASCET criteria. Tortuous course associated with hypertension, widely patent; mild luminal irregularity most compatible with atherosclerosis. VERTEBRAL ARTERIES:New, age indeterminate LEFT V1 non flow limiting dissection flap without pseudo aneurysm. Mild LEFT vertebral artery luminal irregularity. Mild calcific  atherosclerosis RIGHT vertebral artery origin. Bilateral vertebral arteries are patent. SKELETON: No acute osseous process though bone windows have not been submitted. Poor dentition. OTHER NECK: Soft tissues of the neck are nonacute though, not tailored for evaluation. UPPER CHEST: Included lung apices are clear. No superior mediastinal lymphadenopathy. CTA HEAD FINDINGS: ANTERIOR CIRCULATION: Patent cervical internal carotid arteries, petrous, cavernous and supra clinoid internal carotid arteries. Calcific atherosclerosis resulting in moderate to severe stenosis RIGHT para clinoid internal carotid artery. Bilateral 2 mm paraophthalmic outpouchings (axial 103/352). Patent anterior communicating artery. Patent anterior and middle cerebral arteries. Moderate tandem stenoses bilateral anterior cerebral arteries. Moderate stenosis RIGHT M1 origin. Moderate to severe stenosis distal RIGHT M2. Focally occluded versus severe stenosis proximal RIGHT M3 segment. Severe stenosis LEFT M2 inferior division origin. No large vessel occlusion, significant stenosis, contrast extravasation or aneurysm. POSTERIOR CIRCULATION: Patent vertebral arteries, vertebrobasilar junction and basilar artery, as well as main branch vessels with mild luminal irregularity. Moderate stenosis RIGHT V4 segment with mild poststenotic dilatation. Patent posterior cerebral arteries. Moderate to severe tandem stenoses RIGHT posterior cerebral artery. Moderate tandem stenosis LEFT posterior cerebral artery. No large vessel occlusion, significant stenosis, contrast extravasation or aneurysm. VENOUS SINUSES: Major dural venous sinuses are patent though not tailored for evaluation on this angiographic examination. ANATOMIC VARIANTS: None. DELAYED PHASE: Not performed. MIP images reviewed. CT Brain Perfusion Findings: CBF (<30%) Volume: 74mL Perfusion (Tmax>6.0s) volume: 1mL Mismatch Volume: 23mL Infarction Location:Perfusion mismatch bifrontal lobes, likely  artifact. Prolonged mean transit time RIGHT basal ganglia and to lesser extent RIGHT MCA territory. IMPRESSION: CTA NECK: 1. No hemodynamically significant stenosis or acute vascular process in the neck. 2. 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 No pseudoaneurysm. 3. **An incidental finding of potential clinical significance has been found. Ectatic 3.6 cm ascending aorta. Recommend annual imaging followup by CTA or MRA. This recommendation follows 2010 ACCF/AHA/AATS/ACR/ASA/SCA/SCAI/SIR/STS/SVM Guidelines for the Diagnosis and Management of Patients with Thoracic Aortic Disease. Circulation.2010; 121: X528-U132** CTA HEAD: 1. No emergent large vessel occlusion. Focally occluded RIGHT M2/M3 segment. 2. Advanced intracranial atherosclerosis with multifocal high-grade stenosis including severe stenosis LEFT M2 segment, moderate to severe stenoses RIGHT middle cerebral artery and LEFT posterior cerebral artery. 3. 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 CT PERFUSION: 1. No acute perfusion defect. 2. Prolonged mean transit time RIGHT MCA consistent with stenoses. Acute findings discussed with and reconfirmed by Dr.ASHISH ARORA on 11/25/2017 at 12:00 am. Electronically Signed   By: Sandie Ano  Bloomer M.D.   On: 11/26/2017 00:03   Dg Abd Portable 1v  Result Date: 11/26/2017 CLINICAL DATA:  Nasogastric tube placement. EXAM: PORTABLE ABDOMEN - 1 VIEW COMPARISON:  None. FINDINGS: Nasogastric tube coiled in the left upper abdomen with its tip in the proximal to mid stomach and side hole in the proximal stomach. The included portion of the abdomen demonstrates a normal bowel gas pattern. Enlarged cardiac silhouette with a tortuous aorta. Clear lungs with mildly prominent interstitial markings. Thoracic spine degenerative changes and mild scoliosis. IMPRESSION: 1. Nasogastric tube tip and side hole in the stomach. 2. Cardiomegaly and mild chronic interstitial lung  disease. Electronically Signed   By: Claudie Revering M.D.   On: 11/26/2017 14:42   Ct Head Code Stroke Wo Contrast  Result Date: 11/25/2017 CLINICAL DATA:  Code stroke. TIA. Initial evaluation. History of substance abuse, hypertension, hyperlipidemia, intracranial hemorrhage. EXAM: CT HEAD WITHOUT CONTRAST TECHNIQUE: Contiguous axial images were obtained from the base of the skull through the vertex without intravenous contrast. COMPARISON:  MRI of the head June 08, 2013 FINDINGS: BRAIN: No intraparenchymal hemorrhage, mass effect nor midline shift. Age indeterminate LEFT corona radiata to the basal ganglia infarct. Old bilateral basal ganglia infarcts. Ex vacuo dilatation RIGHT greater than LEFT lateral ventricles. 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 Mild global parenchymal brain volume loss for age without hydrocephalus. Confluent supratentorial white matter hypodensities. No acute large vascular territory infarct. No abnormal extra-axial fluid collections. Basal cisterns are patent. VASCULAR: Mildly dense cerebral artery's compatible with hemoconcentration. Mild calcific atherosclerosis carotid siphon and included vertebral artery's. SKULL/SOFT TISSUES: No skull fracture. No significant soft tissue swelling. ORBITS/SINUSES: The included ocular globes and orbital contents are normal.Mild paranasal sinus mucosal thickening without air-fluid levels. Mastoid air cells are well aerated. OTHER: None. ASPECTS [facility] Stroke Program Early CT Score) - Ganglionic level infarction (caudate, lentiform nuclei, internal capsule, insula, M1-M3 cortex): 6 - Supraganglionic infarction (M4-M6 cortex): 3 Total score (0-10 with 10 being normal): 10 IMPRESSION: 1. Potentially acute LEFT corona radiata/basal ganglia infarct. 2. ASPECTS is 9. 3. Old bilateral basal ganglia infarcts. 4. Moderate to severe white matter changes compatible chronic small vessel ischemic disease. 5. Mild  global parenchymal brain volume loss for age. Critical Value/emergent results were called by telephone at the time of interpretation on 11/25/2017 at 11:20 pm to Dr. Rory Percy, Neurology, who verbally acknowledged these results. Electronically Signed   By: Elon Alas M.D.   On: 11/25/2017 23:21   CULTURES: None   ANTIBIOTICS: None   SIGNIFICANT EVENTS: 1/13 AM: admitted with acute CVA 1/13 PM: intubated for airway protection and respiratory distress  LINES/TUBES: Foley catheter 1/13 >> ETT 1/13 >> 2-PIV's >> NG tube   DISCUSSION: 58yoM with hx HTN, CVA, CKD, HLD, Cocaine abuse, dCHF, prior ICH (right basal ganglia hemorrhage 1999, left basal ganglia hemorrhage 10/2004 related to cocaine use), admitted 1/13 AM with acute speech difficulty that began 1/12 at 8pm. Head CT showed possible acute Left basal ganglia infarct. 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 Neurology was consulted and was concerned for possible brainstem vs b/l subcortical infarct. Now intubated for progressive respiratory distress and failure to protect his airway and manage secretions throughout the day.   ASSESSMENT / PLAN:  PULMONARY 1. Acute Hypoxic Respiratory failure; Failure to protect airway and manage secretions: - intubated; follow ABG and CXR and adjust vent as needed - NPO - start GI prophylaxis; SCD's for DVT prophylaxis - CXR on my review post  intubation shows atelectasis; will obtain sputum culture out of concern he may have been aspirating. But in the absence of fever or leukocytosis, will hold off on antibiotics for now.   CARDIOVASCULAR 1. Hx HTN, HLD, Diastolic CHF  RENAL 1. 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 Continue IVF's. Monitor UOP closely. UA shows no infection.  - s/p repletion with 40MEQ KCL; repeat BMP now  GASTROINTESTINAL NPO; GI  prophylaxis  HEMATOLOGIC No active issues   INFECTIOUS No active issues   ENDOCRINE No active issues   NEUROLOGIC 1. Acute CVA; Hx ICH; Hx Cocaine abuse: - Head CT showed possible acute Left basal ganglia infarct.  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  - Neurology was consulted and was concerned for possible brainstem vs b/l subcortical infarct.  - TTE pending - Neuro plans on Brain MRI and EEG   FAMILY  - Updated patient's wife at bedside.  - Inter-disciplinary family meet or Palliative Care meeting due by: 12/03/17  60 minutes nonprocedural critical care time  Vernie Murders, MD  Pulmonary and [place] Pager: [phone]  11/26/2017, 8:27 PM

## 2017-11-26 NOTE — Progress Notes (Signed)
Rehab Admissions Coordinator Note:  Patient was screened by Cleatrice Burke for appropriateness for an Inpatient Acute Rehab Consult per PT recommendation.  At this time, we are recommending Inpatient Rehab consult if pt would like to be considered for admission when medically ready. Please advise.  Cleatrice Burke 11/26/2017, 1:24 PM  I can be reached at [phone].

## 2017-11-26 NOTE — Progress Notes (Signed)
Patient transferred to ICU [id number] due to compromised airway and declining neuro status.  Report given to receiving nurse.

## 2017-11-26 NOTE — Progress Notes (Signed)
Patient diaphoretic.  States is exhausted from effort to breath.  Wife in attendance and states that his change in respirations started at approximately 0200 this am.

## 2017-11-26 NOTE — ED Provider Notes (Signed)
[facility] Provider Note   CSN: [id number] Arrival date & time: 11/25/17  2231   An emergency department physician performed an initial assessment on this suspected stroke patient at 2255.  History   Chief Complaint Chief Complaint  Patient presents with  . Code Stroke    HPI [name] is a 57 y.o. male.  HPI   57yo male with history below including CVA presents with concern for difficulty speaking.  Was in normal state of health today until 8PM. Suddenly developed difficulty thinking.  He then improved, returned to baseline, prior to having symptoms come back again in [place] for 5-10 minutes and improving, then noting it returning in the ED. No new numbness, weakness. No recent illness, no fevers.   Past Medical History:  Diagnosis Date  . Arthritis   . Chest pain    with a negative adenosine cardiolite in December 2006. Dr. Liana Crocker   . 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 Diastolic dysfunction    impaired L ventricle relatation with an EF of 65% and echo in December 2005  . Erectile dysfunction   . Hyperlipidemia    elevated transaminases on statin?-40's (2/06) - Hep B/C negative 4/06; resolution on 4/06 labs; off statin in 2006  . Hypertension    concentric LVH 2d echo 12/05; negative proteinuria 11/00  . 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 Onychomycosis April 2003   right big toe  . Prostate disorder    prostate irregularity with obstructive voiding symptoms (PSA 0.67); -likely related to overstimulation of alpha receptors at bladder neck; seen by Dr. Reece Agar in 9/05. no BPH at  that visit  . Tinea versicolor    dermatology referral; secondary to no treatement response to selenium sulfide; currently resolive    Patient Active Problem List   Diagnosis Date Noted  . Hypokalemia 11/26/2017  . Aphasia 11/26/2017  . Erectile dysfunction 10/04/2016  . Exertional chest pain 07/01/2015  . Low back pain radiating to both legs 02/13/2015  . Microcytosis 01/08/2013  . Osteoarthritis 03/27/2012  . BPH (benign prostatic hyperplasia) 06/06/2011  . Preventative health care 06/06/2011  . History of completed stroke 05/06/2011  . DRUG ABUSE, HX OF 09/19/2006  . Hyperlipidemia 09/18/2006  . Essential hypertension, benign 09/18/2006    Past Surgical History:  Procedure Laterality Date  . COLONOSCOPY WITH PROPOFOL N/A 05/22/2013   Procedure: COLONOSCOPY WITH PROPOFOL;  Surgeon: Arta Silence, MD;  Location: [facility];  Service: Endoscopy;  Laterality: N/A;       Home Medications    Prior to Admission medications   Medication Sig Start Date End Date Taking? Authorizing Provider  amLODipine (NORVASC) 10 MG tablet Take 1 tablet (10 mg total) by mouth every morning. 05/29/17  Yes Tawny Asal, MD  aspirin 81 MG tablet Take 1 tablet (81 mg total) by mouth daily. 10/04/16  Yes Rivet, Sindy Guadeloupe, MD  hydrochlorothiazide (HYDRODIURIL) 25 MG tablet Take 1 tablet (25 mg total) by mouth every morning. 05/29/17  Yes Tawny Asal, MD  lisinopril (PRINIVIL,ZESTRIL) 20 MG tablet Take 1 tablet (20 mg total) by mouth daily. 05/29/17  Yes Tawny Asal, MD  Multiple Vitamins-Minerals (HM MULTIVITAMIN ADULT GUMMY PO) Take 1 tablet by mouth every morning.   Yes [provider]  atorvastatin (LIPITOR) 20 MG tablet Take 1 tablet (20 mg total) by mouth daily. Patient not taking: Reported on 11/25/2017 05/29/17   Tawny Asal, MD  diclofenac sodium (VOLTAREN) 1 % GEL Apply 2 g topically 4 (four) times daily. Patient not taking: Reported on 11/25/2017 12/17/13   Ivor Costa, MD  ibuprofen  (ADVIL,MOTRIN) 600 MG tablet TAKE 1 TABLET BY MOUTH EVERY 8 HOURS AS NEEDED FOR MODERATE PAIN Patient not taking: Reported on 11/25/2017 12/12/15   Rivet, Sindy Guadeloupe, MD  meloxicam (MOBIC) 15 MG tablet Take 1 tablet (15 mg total) by mouth daily. Patient not taking: Reported on 11/25/2017 05/29/17   Tawny Asal, MD  tadalafil (CIALIS) 10 MG tablet Take 1 tablet (10 mg total) by mouth daily as needed for erectile dysfunction. Patient not taking: Reported on 11/25/2017 07/04/17   Tawny Asal, MD    Family History Family History  Problem Relation Age of Onset  . Alcohol abuse Father   . Liver cancer Father   . Diabetes Father     Social History Social History   Tobacco Use  . Smoking status: Never Smoker  . Smokeless tobacco: Never Used  Substance Use Topics  . Alcohol use: Yes    Alcohol/week: 0.0 oz    Comment: Beer rarely.  . Drug use: No    Comment: former cocaine abuse, used [place]     Allergies   Patient has no known allergies.   Review of Systems Review of Systems  Constitutional: Negative for fever.  HENT: Negative for sore throat.   Eyes: Negative for visual disturbance.  Respiratory: Negative for shortness of breath.   Cardiovascular: Negative for chest pain.  Gastrointestinal: Negative for abdominal pain, diarrhea, nausea and vomiting.  Genitourinary: Positive for frequency. Negative for difficulty urinating.  Musculoskeletal: Negative for back pain and neck stiffness.  Skin: Negative for rash.  Neurological: Positive for speech difficulty. Negative for syncope, facial asymmetry, weakness, numbness and headaches.     Physical Exam Updated Vital Signs BP (!) 163/106   Pulse 92   Temp 98.5 F (36.9 C) (Oral)   Resp (!) 25   Ht 5\' 7"  (1.702 m)   Wt 81.6 kg (179 lb 14.3 oz)   SpO2 99%   BMI 28.18 kg/m   Physical Exam  Constitutional: He is oriented to person, place, and time. He appears well-developed and well-nourished. No distress.  HENT:    Head: Normocephalic and atraumatic.  Eyes: Conjunctivae and EOM are normal.  Neck: Normal range of motion.  Cardiovascular: Normal rate, regular rhythm, normal heart sounds and intact distal pulses. Exam reveals no gallop and no friction rub.  No murmur heard. Pulmonary/Chest: Effort normal and breath sounds normal. No respiratory distress. He has no wheezes. He has no rales.  Abdominal: Soft. He exhibits no distension. There is no tenderness. There is no guarding.  Musculoskeletal: He exhibits no edema.  Neurological: He is alert and oriented to person, place, and time. No cranial nerve deficit or sensory deficit. GCS eye subscore is 4. GCS verbal subscore is 5. GCS motor subscore is 6.  Weakness LUE and LLE, chronic Spasticity LUE Difficulty finding words on initial eval, hesitancy with speech  Skin: Skin is warm and dry. He is not diaphoretic.  Nursing note and vitals reviewed.    ED Treatments / Results  Labs (all labs ordered are listed, but only  abnormal results are displayed) Labs Reviewed  CBC - Abnormal; Notable for the following components:      Result Value   WBC 3.8 (*)    RBC 5.98 (*)    MCV 70.1 (*)    MCH 23.6 (*)    All other components within normal limits  COMPREHENSIVE METABOLIC PANEL - Abnormal; Notable for the following components:   Potassium 3.3 (*)    Creatinine, Ser 1.30 (*)    All other components within normal limits  URINALYSIS, ROUTINE W REFLEX MICROSCOPIC - Abnormal; Notable for the following components:   Color, Urine STRAW (*)    Hgb urine dipstick SMALL (*)    Protein, ur 30 (*)    Squamous Epithelial / LPF 0-5 (*)    All other components within normal limits  I-STAT CHEM 8, ED - Abnormal; Notable for the following components:   Potassium 3.3 (*)    Creatinine, Ser 1.30 (*)    Calcium, Ion 1.12 (*)    All other components within normal limits  PROTIME-INR  APTT  DIFFERENTIAL  RAPID URINE DRUG SCREEN, HOSP PERFORMED  HIV ANTIBODY  (ROUTINE TESTING)  HEMOGLOBIN A1C  LIPID PANEL  MAGNESIUM  I-STAT TROPONIN, ED  CBG MONITORING, ED    EKG  EKG Interpretation  Date/Time:  Saturday November 25 2017 23:43:43 EST Ventricular Rate:  98 PR Interval:    QRS Duration: 87 QT Interval:  355 QTC Calculation: 454 R Axis:   34 Text Interpretation:  Sinus rhythm Interpretation limited secondary to artifact No significant change since last tracing Confirmed by Ripley Fraise [phone]) on 11/25/2017 11:58:48 PM       Radiology Ct Angio Head W Or Wo Contrast  Result Date: 11/26/2017 CLINICAL DATA:  Follow-up code stroke. History of stroke, substance abuse, hypertension, hyperlipidemia. EXAM: CT ANGIOGRAPHY HEAD AND NECK CT PERFUSION BRAIN TECHNIQUE: Multidetector CT imaging of the head and neck was performed using the standard protocol during bolus administration of intravenous contrast. Multiplanar CT image reconstructions and MIPs were obtained to evaluate the vascular anatomy. Carotid stenosis measurements (when applicable) are obtained utilizing NASCET criteria, using the distal internal carotid diameter as the denominator. Multiphase CT imaging of the brain was performed following IV bolus contrast injection. Subsequent parametric perfusion maps were calculated using RAPID software. CONTRAST:  156mL ISOVUE-370 IOPAMIDOL (ISOVUE-370) INJECTION 76% COMPARISON:  CT HEAD November 25, 2017 at 11:11 p.m. MRA head and neck January 07, 2007 FINDINGS: CTA NECK FINDINGS: AORTIC ARCH: 3.6 cm ascending aorta, normal branch pattern. Mild intimal thickening aortic arch. The origins of the innominate, left Common carotid artery and subclavian artery are widely patent. Mild stenosis mid LEFT subclavian artery. RIGHT CAROTID SYSTEM: Common carotid artery is widely patent, coursing in a straight line fashion. Normal appearance of the carotid bifurcation without hemodynamically significant stenosis by NASCET criteria. Tortuous course associated with  hypertension. LEFT CAROTID SYSTEM: Common carotid artery is widely patent, coursing in a straight line fashion. Normal appearance of the carotid bifurcation without hemodynamically significant stenosis by NASCET criteria. Tortuous course associated with hypertension, widely patent; mild luminal irregularity most compatible with atherosclerosis. VERTEBRAL ARTERIES:New, age indeterminate LEFT V1 non flow limiting dissection flap without pseudo aneurysm. Mild LEFT vertebral artery luminal irregularity. Mild calcific atherosclerosis RIGHT vertebral artery origin. Bilateral vertebral arteries are patent. SKELETON: No acute osseous process though bone windows have not been submitted. Poor dentition. OTHER NECK: Soft tissues of the neck are nonacute though, not tailored for evaluation. UPPER CHEST: Included lung apices are clear.  No superior mediastinal lymphadenopathy. CTA HEAD FINDINGS: ANTERIOR CIRCULATION: Patent cervical internal carotid arteries, petrous, cavernous and supra clinoid internal carotid arteries. Calcific atherosclerosis resulting in moderate to severe stenosis RIGHT para clinoid internal carotid artery. Bilateral 2 mm paraophthalmic outpouchings (axial 103/352). Patent anterior communicating artery. Patent anterior and middle cerebral arteries. Moderate tandem stenoses bilateral anterior cerebral arteries. Moderate stenosis RIGHT M1 origin. Moderate to severe stenosis distal RIGHT M2. Focally occluded versus severe stenosis proximal RIGHT M3 segment. Severe stenosis LEFT M2 inferior division origin. No large vessel occlusion, significant stenosis, contrast extravasation or aneurysm. POSTERIOR CIRCULATION: Patent vertebral arteries, vertebrobasilar junction and basilar artery, as well as main branch vessels with mild luminal irregularity. Moderate stenosis RIGHT V4 segment with mild poststenotic dilatation. Patent posterior cerebral arteries. Moderate to severe tandem stenoses RIGHT posterior cerebral  artery. Moderate tandem stenosis LEFT posterior cerebral artery. No large vessel occlusion, significant stenosis, contrast extravasation or aneurysm. VENOUS SINUSES: Major dural venous sinuses are patent though not tailored for evaluation on this angiographic examination. ANATOMIC VARIANTS: None. DELAYED PHASE: Not performed. MIP images reviewed. CT Brain Perfusion Findings: CBF (<30%) Volume: 16mL Perfusion (Tmax>6.0s) volume: 28mL Mismatch Volume: 59mL Infarction Location:Perfusion mismatch bifrontal lobes, likely artifact. Prolonged mean transit time RIGHT basal ganglia and to lesser extent RIGHT MCA territory. IMPRESSION: CTA NECK: 1. No hemodynamically significant stenosis or acute vascular process in the neck. 2. 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 No pseudoaneurysm. 3. **An incidental finding of potential clinical significance has been found. Ectatic 3.6 cm ascending aorta. Recommend annual imaging followup by CTA or MRA. This recommendation follows 2010 ACCF/AHA/AATS/ACR/ASA/SCA/SCAI/SIR/STS/SVM Guidelines for the Diagnosis and Management of Patients with Thoracic Aortic Disease. Circulation.2010; 121: D176-H607** CTA HEAD: 1. No emergent large vessel occlusion. Focally occluded RIGHT M2/M3 segment. 2. Advanced intracranial atherosclerosis with multifocal high-grade stenosis including severe stenosis LEFT M2 segment, moderate to severe stenoses RIGHT middle cerebral artery and LEFT posterior cerebral artery. 3. 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 CT PERFUSION: 1. No acute perfusion defect. 2. Prolonged mean transit time RIGHT MCA consistent with stenoses. Acute findings discussed with and reconfirmed by Dr.ASHISH ARORA on 11/25/2017 at 12:00 am. Electronically Signed   By: Elon Alas M.D.   On: 11/26/2017 00:03   Ct Angio Neck W Or Wo Contrast  Result Date: 11/26/2017 CLINICAL DATA:  Follow-up code stroke. History of stroke,  substance abuse, hypertension, hyperlipidemia. EXAM: CT ANGIOGRAPHY HEAD AND NECK CT PERFUSION BRAIN TECHNIQUE: Multidetector CT imaging of the head and neck was performed using the standard protocol during bolus administration of intravenous contrast. Multiplanar CT image reconstructions and MIPs were obtained to evaluate the vascular anatomy. Carotid stenosis measurements (when applicable) are obtained utilizing NASCET criteria, using the distal internal carotid diameter as the denominator. Multiphase CT imaging of the brain was performed following IV bolus contrast injection. Subsequent parametric perfusion maps were calculated using RAPID software. CONTRAST:  180mL ISOVUE-370 IOPAMIDOL (ISOVUE-370) INJECTION 76% COMPARISON:  CT HEAD November 25, 2017 at 11:11 p.m. MRA head and neck January 07, 2007 FINDINGS: CTA NECK FINDINGS: AORTIC ARCH: 3.6 cm ascending aorta, normal branch pattern. Mild intimal thickening aortic arch. The origins of the innominate, left Common carotid artery and subclavian artery are widely patent. Mild stenosis mid LEFT subclavian artery. RIGHT CAROTID SYSTEM: Common carotid artery is widely patent, coursing in a straight line fashion. Normal appearance of the carotid bifurcation without hemodynamically significant stenosis by NASCET criteria. Tortuous course associated with hypertension. LEFT CAROTID SYSTEM: Common carotid  artery is widely patent, coursing in a straight line fashion. Normal appearance of the carotid bifurcation without hemodynamically significant stenosis by NASCET criteria. Tortuous course associated with hypertension, widely patent; mild luminal irregularity most compatible with atherosclerosis. VERTEBRAL ARTERIES:New, age indeterminate LEFT V1 non flow limiting dissection flap without pseudo aneurysm. Mild LEFT vertebral artery luminal irregularity. Mild calcific atherosclerosis RIGHT vertebral artery origin. Bilateral vertebral arteries are patent. SKELETON: No acute  osseous process though bone windows have not been submitted. Poor dentition. OTHER NECK: Soft tissues of the neck are nonacute though, not tailored for evaluation. UPPER CHEST: Included lung apices are clear. No superior mediastinal lymphadenopathy. CTA HEAD FINDINGS: ANTERIOR CIRCULATION: Patent cervical internal carotid arteries, petrous, cavernous and supra clinoid internal carotid arteries. Calcific atherosclerosis resulting in moderate to severe stenosis RIGHT para clinoid internal carotid artery. Bilateral 2 mm paraophthalmic outpouchings (axial 103/352). Patent anterior communicating artery. Patent anterior and middle cerebral arteries. Moderate tandem stenoses bilateral anterior cerebral arteries. Moderate stenosis RIGHT M1 origin. Moderate to severe stenosis distal RIGHT M2. Focally occluded versus severe stenosis proximal RIGHT M3 segment. Severe stenosis LEFT M2 inferior division origin. No large vessel occlusion, significant stenosis, contrast extravasation or aneurysm. POSTERIOR CIRCULATION: Patent vertebral arteries, vertebrobasilar junction and basilar artery, as well as main branch vessels with mild luminal irregularity. Moderate stenosis RIGHT V4 segment with mild poststenotic dilatation. Patent posterior cerebral arteries. Moderate to severe tandem stenoses RIGHT posterior cerebral artery. Moderate tandem stenosis LEFT posterior cerebral artery. No large vessel occlusion, significant stenosis, contrast extravasation or aneurysm. VENOUS SINUSES: Major dural venous sinuses are patent though not tailored for evaluation on this angiographic examination. ANATOMIC VARIANTS: None. DELAYED PHASE: Not performed. MIP images reviewed. CT Brain Perfusion Findings: CBF (<30%) Volume: 57mL Perfusion (Tmax>6.0s) volume: 46mL Mismatch Volume: 26mL Infarction Location:Perfusion mismatch bifrontal lobes, likely artifact. Prolonged mean transit time RIGHT basal ganglia and to lesser extent RIGHT MCA territory.  IMPRESSION: CTA NECK: 1. No hemodynamically significant stenosis or acute vascular process in the neck. 2. 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 No pseudoaneurysm. 3. **An incidental finding of potential clinical significance has been found. Ectatic 3.6 cm ascending aorta. Recommend annual imaging followup by CTA or MRA. This recommendation follows 2010 ACCF/AHA/AATS/ACR/ASA/SCA/SCAI/SIR/STS/SVM Guidelines for the Diagnosis and Management of Patients with Thoracic Aortic Disease. Circulation.2010; 121: F810-F751** CTA HEAD: 1. No emergent large vessel occlusion. Focally occluded RIGHT M2/M3 segment. 2. Advanced intracranial atherosclerosis with multifocal high-grade stenosis including severe stenosis LEFT M2 segment, moderate to severe stenoses RIGHT middle cerebral artery and LEFT posterior cerebral artery. 3. 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 CT PERFUSION: 1. No acute perfusion defect. 2. Prolonged mean transit time RIGHT MCA consistent with stenoses. Acute findings discussed with and reconfirmed by Dr.ASHISH ARORA on 11/25/2017 at 12:00 am. Electronically Signed   By: Elon Alas M.D.   On: 11/26/2017 00:03   Ct Cerebral Perfusion W Contrast  Result Date: 11/26/2017 CLINICAL DATA:  Follow-up code stroke. History of stroke, substance abuse, hypertension, hyperlipidemia. EXAM: CT ANGIOGRAPHY HEAD AND NECK CT PERFUSION BRAIN TECHNIQUE: Multidetector CT imaging of the head and neck was performed using the standard protocol during bolus administration of intravenous contrast. Multiplanar CT image reconstructions and MIPs were obtained to evaluate the vascular anatomy. Carotid stenosis measurements (when applicable) are obtained utilizing NASCET criteria, using the distal internal carotid diameter as the denominator. Multiphase CT imaging of the brain was performed following IV bolus contrast injection. Subsequent parametric perfusion maps  were calculated using RAPID  software. CONTRAST:  164mL ISOVUE-370 IOPAMIDOL (ISOVUE-370) INJECTION 76% COMPARISON:  CT HEAD November 25, 2017 at 11:11 p.m. MRA head and neck January 07, 2007 FINDINGS: CTA NECK FINDINGS: AORTIC ARCH: 3.6 cm ascending aorta, normal branch pattern. Mild intimal thickening aortic arch. The origins of the innominate, left Common carotid artery and subclavian artery are widely patent. Mild stenosis mid LEFT subclavian artery. RIGHT CAROTID SYSTEM: Common carotid artery is widely patent, coursing in a straight line fashion. Normal appearance of the carotid bifurcation without hemodynamically significant stenosis by NASCET criteria. Tortuous course associated with hypertension. LEFT CAROTID SYSTEM: Common carotid artery is widely patent, coursing in a straight line fashion. Normal appearance of the carotid bifurcation without hemodynamically significant stenosis by NASCET criteria. Tortuous course associated with hypertension, widely patent; mild luminal irregularity most compatible with atherosclerosis. VERTEBRAL ARTERIES:New, age indeterminate LEFT V1 non flow limiting dissection flap without pseudo aneurysm. Mild LEFT vertebral artery luminal irregularity. Mild calcific atherosclerosis RIGHT vertebral artery origin. Bilateral vertebral arteries are patent. SKELETON: No acute osseous process though bone windows have not been submitted. Poor dentition. OTHER NECK: Soft tissues of the neck are nonacute though, not tailored for evaluation. UPPER CHEST: Included lung apices are clear. No superior mediastinal lymphadenopathy. CTA HEAD FINDINGS: ANTERIOR CIRCULATION: Patent cervical internal carotid arteries, petrous, cavernous and supra clinoid internal carotid arteries. Calcific atherosclerosis resulting in moderate to severe stenosis RIGHT para clinoid internal carotid artery. Bilateral 2 mm paraophthalmic outpouchings (axial 103/352). Patent anterior communicating artery. Patent anterior  and middle cerebral arteries. Moderate tandem stenoses bilateral anterior cerebral arteries. Moderate stenosis RIGHT M1 origin. Moderate to severe stenosis distal RIGHT M2. Focally occluded versus severe stenosis proximal RIGHT M3 segment. Severe stenosis LEFT M2 inferior division origin. No large vessel occlusion, significant stenosis, contrast extravasation or aneurysm. POSTERIOR CIRCULATION: Patent vertebral arteries, vertebrobasilar junction and basilar artery, as well as main branch vessels with mild luminal irregularity. Moderate stenosis RIGHT V4 segment with mild poststenotic dilatation. Patent posterior cerebral arteries. Moderate to severe tandem stenoses RIGHT posterior cerebral artery. Moderate tandem stenosis LEFT posterior cerebral artery. No large vessel occlusion, significant stenosis, contrast extravasation or aneurysm. VENOUS SINUSES: Major dural venous sinuses are patent though not tailored for evaluation on this angiographic examination. ANATOMIC VARIANTS: None. DELAYED PHASE: Not performed. MIP images reviewed. CT Brain Perfusion Findings: CBF (<30%) Volume: 86mL Perfusion (Tmax>6.0s) volume: 70mL Mismatch Volume: 1mL Infarction Location:Perfusion mismatch bifrontal lobes, likely artifact. Prolonged mean transit time RIGHT basal ganglia and to lesser extent RIGHT MCA territory. IMPRESSION: CTA NECK: 1. No hemodynamically significant stenosis or acute vascular process in the neck. 2. 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 No pseudoaneurysm. 3. **An incidental finding of potential clinical significance has been found. Ectatic 3.6 cm ascending aorta. Recommend annual imaging followup by CTA or MRA. This recommendation follows 2010 ACCF/AHA/AATS/ACR/ASA/SCA/SCAI/SIR/STS/SVM Guidelines for the Diagnosis and Management of Patients with Thoracic Aortic Disease. Circulation.2010; 121: H962-I297** CTA HEAD: 1. No emergent large vessel occlusion. Focally occluded RIGHT  M2/M3 segment. 2. Advanced intracranial atherosclerosis with multifocal high-grade stenosis including severe stenosis LEFT M2 segment, moderate to severe stenoses RIGHT middle cerebral artery and LEFT posterior cerebral artery. 3. 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 CT PERFUSION: 1. No acute perfusion defect. 2. Prolonged mean transit time RIGHT MCA consistent with stenoses. Acute findings discussed with and reconfirmed by Dr.ASHISH ARORA on 11/25/2017 at 12:00 am. Electronically Signed   By: Elon Alas M.D.   On: 11/26/2017 00:03   Ct Head Code  Stroke Wo Contrast  Result Date: 11/25/2017 CLINICAL DATA:  Code stroke. TIA. Initial evaluation. History of substance abuse, hypertension, hyperlipidemia, intracranial hemorrhage. EXAM: CT HEAD WITHOUT CONTRAST TECHNIQUE: Contiguous axial images were obtained from the base of the skull through the vertex without intravenous contrast. COMPARISON:  MRI of the head June 08, 2013 FINDINGS: BRAIN: No intraparenchymal hemorrhage, mass effect nor midline shift. Age indeterminate LEFT corona radiata to the basal ganglia infarct. Old bilateral basal ganglia infarcts. Ex vacuo dilatation RIGHT greater than LEFT lateral ventricles. 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 Mild global parenchymal brain volume loss for age without hydrocephalus. Confluent supratentorial white matter hypodensities. No acute large vascular territory infarct. No abnormal extra-axial fluid collections. Basal cisterns are patent. VASCULAR: Mildly dense cerebral artery's compatible with hemoconcentration. Mild calcific atherosclerosis carotid siphon and included vertebral artery's. SKULL/SOFT TISSUES: No skull fracture. No significant soft tissue swelling. ORBITS/SINUSES: The included ocular globes and orbital contents are normal.Mild paranasal sinus mucosal thickening without air-fluid levels. Mastoid air cells are well  aerated. OTHER: None. ASPECTS [facility] Stroke Program Early CT Score) - Ganglionic level infarction (caudate, lentiform nuclei, internal capsule, insula, M1-M3 cortex): 6 - Supraganglionic infarction (M4-M6 cortex): 3 Total score (0-10 with 10 being normal): 10 IMPRESSION: 1. Potentially acute LEFT corona radiata/basal ganglia infarct. 2. ASPECTS is 9. 3. Old bilateral basal ganglia infarcts. 4. Moderate to severe white matter changes compatible chronic small vessel ischemic disease. 5. Mild global parenchymal brain volume loss for age. Critical Value/emergent results were called by telephone at the time of interpretation on 11/25/2017 at 11:20 pm to Dr. Rory Percy, Neurology, who verbally acknowledged these results. Electronically Signed   By: Elon Alas M.D.   On: 11/25/2017 23:21    Procedures Procedures (including critical care time)  Medications Ordered in ED Medications  atorvastatin (LIPITOR) tablet 80 mg (not administered)   stroke: mapping our early stages of recovery book (not administered)  acetaminophen (TYLENOL) tablet 650 mg (not administered)    Or  acetaminophen (TYLENOL) solution 650 mg (not administered)    Or  acetaminophen (TYLENOL) suppository 650 mg (not administered)  senna-docusate (Senokot-S) tablet 1 tablet (not administered)  enoxaparin (LOVENOX) injection 40 mg (not administered)  clopidogrel (PLAVIX) tablet 75 mg (not administered)  labetalol (NORMODYNE,TRANDATE) injection 5 mg (not administered)  0.9 % NaCl with KCl 40 mEq / L  infusion (110 mL/hr Intravenous New Bag/Given 11/26/17 0254)  iopamidol (ISOVUE-370) 76 % injection (100 mLs  Contrast Given 11/25/17 2316)     Initial Impression / Assessment and Plan / ED Course  I have reviewed the triage vital signs and the nursing notes.  Pertinent labs & imaging results that were available during my care of the patient were reviewed by me and considered in my medical decision making (see chart for details).      58yo male with history above including CVAs presents with concern for difficulty speaking. Exam on arrival consistent with aphasia. Code Stroke called. Speech improved at time of Neurology eval. Consider TIAs, CVA or seizures.  Will admit for continued work up and care.   Final Clinical Impressions(s) / ED Diagnoses   Final diagnoses:  Aphasia    ED Discharge Orders    None       Gareth Morgan, MD 11/26/17 [phone]

## 2017-11-26 NOTE — Progress Notes (Addendum)
PROGRESS NOTE   [name]  DGU:[id number]    DOB: 1961-11-12    DOA: 11/25/2017  PCP: Tawny Asal, MD   I have briefly reviewed patients previous medical records in [facility].  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 Neurology consulted. Dysphagia/difficulty clearing secretions hence transferred to stepdown unit for close monitoring, suctioning.   Assessment & Plan:   Principal Problem:   Aphasia Active Problems:   Essential hypertension, benign   History of completed stroke   Hypokalemia   1. Aphasia/Suspected acute stroke(brainstem Vs b/l subcortical) with bulbar weakness: Patient evaluated this morning along with Dr. Erlinda Hong, Stroke MD. CT head: Potential acute left corona radiata/basal ganglia infarct. CTA neck: No hemodynamically significant stenosis, age indeterminate nonflow limiting left vertebral artery dissection. CTA head: No emergent LVO. Focal occluded right M2/M3 segment, advanced intracranial atherosclerosis with multifocal high-grade stenosis. CT perfusion: No acute perfusion defect. Complete stroke work up> MRI when able, 2-D echo, A1c, LDL. Also checking EEG in case this is seizures. Therapies evaluation. Unable to proceed with MRI right now due to difficulty lying flat from inability to clear upper airway secretions and associated dyspnea. Nothing by mouth, frequent suctioning, nurse with HOB elevated, NG tube for medications. Once NG tube is in, DC rectal aspirin and start Plavix. If unable to protect airway, will consult CCM. 2. Hyperlipidemia: LDL 251, goal <70. Atorvastatin 80 mg daily. 3. Essential hypertension: Allow permissive hypertension up to 220/120 for 24-48 hours post stroke then  gradually tighten BP control over 5-7 days. Holding home antihypertensives. 4. Hypokalemia: Replace and follow. Magnesium 1.9. 5. Acute on Stage II chronic kidney disease: Baseline creatinine probably in the 1.1-1.2 range. Creatinine currently in the 1.3 range. Hold ACEI. IV fluids. Follow BMP in a.m. Avoid nephrotoxins. 6. 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 7. Ectatic 3.6 cm ascending aorta: Outpatient follow-up with annual CTA or MRA.   DVT prophylaxis: Lovenox Code Status: Full Family Communication: Discussed in detail with patient's spouse at bedside. Disposition: To be determined.   Consultants:  Neurology   Procedures:  None  Antimicrobials:  None    Subjective: Patient interviewed and examined with stroke M.D. this morning. Patient unable to speak, persistent since approximately 2 AM on night of admission. Noisy breathing and some difficulty breathing. Chronic left-sided weakness and stiffness. Noted to have noisy upper airway sound, much improved after elevating HOB.   ROS: As above.  Objective:  Vitals:   11/26/17 0100 11/26/17 0435 11/26/17 0901 11/26/17 1016  BP:  (!) 167/107 (!) 180/120 (!) 154/107  Pulse: 92 93 (!) 125 (!) 106  Resp: (!) 25 16 20 18   Temp:  98.5 F (36.9 C)  99.8 F (37.7 C)  TempSrc:  Oral  Axillary  SpO2: 99% 98% 100% 99%  Weight:      Height:        Examination:  General exam: Middle-aged male, moderately built and nourished, lying propped up in bed. Initially had noisy upper airway breathing which improved after raising HOB. ENT: No swelling of lips or tongue. No obvious stridor. Respiratory system: Clear to auscultation. Respiratory effort normal. Transmitted upper airway sounds. Cardiovascular system: S1 & S2 heard, RRR. No JVD, murmurs, rubs, gallops  or clicks. No pedal edema. Telemetry: SR-ST in the 120s. Gastrointestinal system: Abdomen is nondistended, soft and nontender. No organomegaly or  masses felt. Normal bowel sounds heard. Central nervous system: Alert and oriented-follows instructions appropriately. Aphasic (expressive- able to write and communicate). No facial asymmetry.  Extremities: Grade 5 power in right upper and both lower extremities. Left upper extremity with increased tone and power grade 4 x 5. Left ankle clonus. Skin: No rashes, lesions or ulcers Psychiatry: Judgement and insight appear normal. Mood & affect appropriate.     Data Reviewed: I have personally reviewed following labs and imaging studies  CBC: Recent Labs  Lab 11/25/17 2246 11/25/17 2259 11/26/17 0735  WBC 3.8*  --  5.2  NEUTROABS 1.7  --   --   HGB 14.1 15.0 13.9  HCT 41.9 44.0 41.8  MCV 70.1*  --  70.3*  PLT 242  --  585   Basic Metabolic Panel: Recent Labs  Lab 11/25/17 2246 11/25/17 2259 11/26/17 0735  NA 136 142 138  K 3.3* 3.3* 3.2*  CL 102 102 103  CO2 23  --  23  GLUCOSE 94 99 119*  BUN 13 14 9   CREATININE 1.30* 1.30* 1.39*  CALCIUM 9.1  --  9.3  MG  --   --  1.9   Liver Function Tests: Recent Labs  Lab 11/25/17 2246  AST 27  ALT 21  ALKPHOS 92  BILITOT 1.0  PROT 7.5  ALBUMIN 3.9   Coagulation Profile: Recent Labs  Lab 11/25/17 2246  INR 0.98   Cardiac Enzymes: No results for input(s): CKTOTAL, CKMB, CKMBINDEX, TROPONINI in the last 168 hours. HbA1C: No results for input(s): HGBA1C in the last 72 hours. CBG: Recent Labs  Lab 11/25/17 2256  GLUCAP 87    No results found for this or any previous visit (from the past 240 hour(s)).       Radiology Studies: Ct Angio Head W Or Wo Contrast  Result Date: 11/26/2017 CLINICAL DATA:  Follow-up code stroke. History of stroke, substance abuse, hypertension, hyperlipidemia. EXAM: CT ANGIOGRAPHY HEAD AND NECK CT PERFUSION BRAIN TECHNIQUE: Multidetector CT imaging of the head and neck was performed using the standard protocol during bolus administration of intravenous contrast. Multiplanar CT image  reconstructions and MIPs were obtained to evaluate the vascular anatomy. Carotid stenosis measurements (when applicable) are obtained utilizing NASCET criteria, using the distal internal carotid diameter as the denominator. Multiphase CT imaging of the brain was performed following IV bolus contrast injection. Subsequent parametric perfusion maps were calculated using RAPID software. CONTRAST:  13mL ISOVUE-370 IOPAMIDOL (ISOVUE-370) INJECTION 76% COMPARISON:  CT HEAD November 25, 2017 at 11:11 p.m. MRA head and neck January 07, 2007 FINDINGS: CTA NECK FINDINGS: AORTIC ARCH: 3.6 cm ascending aorta, normal branch pattern. Mild intimal thickening aortic arch. The origins of the innominate, left Common carotid artery and subclavian artery are widely patent. Mild stenosis mid LEFT subclavian artery. RIGHT CAROTID SYSTEM: Common carotid artery is widely patent, coursing in a straight line fashion. Normal appearance of the carotid bifurcation without hemodynamically significant stenosis by NASCET criteria. Tortuous course associated with hypertension. LEFT CAROTID SYSTEM: Common carotid artery is widely patent, coursing in a straight line fashion. Normal appearance of the carotid bifurcation without hemodynamically significant stenosis by NASCET criteria. Tortuous course associated with hypertension, widely patent; mild luminal irregularity most compatible with atherosclerosis. VERTEBRAL ARTERIES:New, age indeterminate LEFT V1 non flow limiting dissection flap without pseudo aneurysm. Mild LEFT vertebral artery luminal irregularity. Mild calcific atherosclerosis  RIGHT vertebral artery origin. Bilateral vertebral arteries are patent. SKELETON: No acute osseous process though bone windows have not been submitted. Poor dentition. OTHER NECK: Soft tissues of the neck are nonacute though, not tailored for evaluation. UPPER CHEST: Included lung apices are clear. No superior mediastinal lymphadenopathy. CTA HEAD FINDINGS:  ANTERIOR CIRCULATION: Patent cervical internal carotid arteries, petrous, cavernous and supra clinoid internal carotid arteries. Calcific atherosclerosis resulting in moderate to severe stenosis RIGHT para clinoid internal carotid artery. Bilateral 2 mm paraophthalmic outpouchings (axial 103/352). Patent anterior communicating artery. Patent anterior and middle cerebral arteries. Moderate tandem stenoses bilateral anterior cerebral arteries. Moderate stenosis RIGHT M1 origin. Moderate to severe stenosis distal RIGHT M2. Focally occluded versus severe stenosis proximal RIGHT M3 segment. Severe stenosis LEFT M2 inferior division origin. No large vessel occlusion, significant stenosis, contrast extravasation or aneurysm. POSTERIOR CIRCULATION: Patent vertebral arteries, vertebrobasilar junction and basilar artery, as well as main branch vessels with mild luminal irregularity. Moderate stenosis RIGHT V4 segment with mild poststenotic dilatation. Patent posterior cerebral arteries. Moderate to severe tandem stenoses RIGHT posterior cerebral artery. Moderate tandem stenosis LEFT posterior cerebral artery. No large vessel occlusion, significant stenosis, contrast extravasation or aneurysm. VENOUS SINUSES: Major dural venous sinuses are patent though not tailored for evaluation on this angiographic examination. ANATOMIC VARIANTS: None. DELAYED PHASE: Not performed. MIP images reviewed. CT Brain Perfusion Findings: CBF (<30%) Volume: 46mL Perfusion (Tmax>6.0s) volume: 2mL Mismatch Volume: 9mL Infarction Location:Perfusion mismatch bifrontal lobes, likely artifact. Prolonged mean transit time RIGHT basal ganglia and to lesser extent RIGHT MCA territory. IMPRESSION: CTA NECK: 1. No hemodynamically significant stenosis or acute vascular process in the neck. 2. 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 No pseudoaneurysm. 3. **An incidental finding of potential clinical significance has been  found. Ectatic 3.6 cm ascending aorta. Recommend annual imaging followup by CTA or MRA. This recommendation follows 2010 ACCF/AHA/AATS/ACR/ASA/SCA/SCAI/SIR/STS/SVM Guidelines for the Diagnosis and Management of Patients with Thoracic Aortic Disease. Circulation.2010; 121: G921-J941** CTA HEAD: 1. No emergent large vessel occlusion. Focally occluded RIGHT M2/M3 segment. 2. Advanced intracranial atherosclerosis with multifocal high-grade stenosis including severe stenosis LEFT M2 segment, moderate to severe stenoses RIGHT middle cerebral artery and LEFT posterior cerebral artery. 3. 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 CT PERFUSION: 1. No acute perfusion defect. 2. Prolonged mean transit time RIGHT MCA consistent with stenoses. Acute findings discussed with and reconfirmed by Dr.ASHISH ARORA on 11/25/2017 at 12:00 am. Electronically Signed   By: Elon Alas M.D.   On: 11/26/2017 00:03   Ct Angio Neck W Or Wo Contrast  Result Date: 11/26/2017 CLINICAL DATA:  Follow-up code stroke. History of stroke, substance abuse, hypertension, hyperlipidemia. EXAM: CT ANGIOGRAPHY HEAD AND NECK CT PERFUSION BRAIN TECHNIQUE: Multidetector CT imaging of the head and neck was performed using the standard protocol during bolus administration of intravenous contrast. Multiplanar CT image reconstructions and MIPs were obtained to evaluate the vascular anatomy. Carotid stenosis measurements (when applicable) are obtained utilizing NASCET criteria, using the distal internal carotid diameter as the denominator. Multiphase CT imaging of the brain was performed following IV bolus contrast injection. Subsequent parametric perfusion maps were calculated using RAPID software. CONTRAST:  119mL ISOVUE-370 IOPAMIDOL (ISOVUE-370) INJECTION 76% COMPARISON:  CT HEAD November 25, 2017 at 11:11 p.m. MRA head and neck January 07, 2007 FINDINGS: CTA NECK FINDINGS: AORTIC ARCH: 3.6 cm ascending aorta, normal branch  pattern. Mild intimal thickening aortic arch. The origins of the innominate, left Common carotid artery and subclavian artery are widely  patent. Mild stenosis mid LEFT subclavian artery. RIGHT CAROTID SYSTEM: Common carotid artery is widely patent, coursing in a straight line fashion. Normal appearance of the carotid bifurcation without hemodynamically significant stenosis by NASCET criteria. Tortuous course associated with hypertension. LEFT CAROTID SYSTEM: Common carotid artery is widely patent, coursing in a straight line fashion. Normal appearance of the carotid bifurcation without hemodynamically significant stenosis by NASCET criteria. Tortuous course associated with hypertension, widely patent; mild luminal irregularity most compatible with atherosclerosis. VERTEBRAL ARTERIES:New, age indeterminate LEFT V1 non flow limiting dissection flap without pseudo aneurysm. Mild LEFT vertebral artery luminal irregularity. Mild calcific atherosclerosis RIGHT vertebral artery origin. Bilateral vertebral arteries are patent. SKELETON: No acute osseous process though bone windows have not been submitted. Poor dentition. OTHER NECK: Soft tissues of the neck are nonacute though, not tailored for evaluation. UPPER CHEST: Included lung apices are clear. No superior mediastinal lymphadenopathy. CTA HEAD FINDINGS: ANTERIOR CIRCULATION: Patent cervical internal carotid arteries, petrous, cavernous and supra clinoid internal carotid arteries. Calcific atherosclerosis resulting in moderate to severe stenosis RIGHT para clinoid internal carotid artery. Bilateral 2 mm paraophthalmic outpouchings (axial 103/352). Patent anterior communicating artery. Patent anterior and middle cerebral arteries. Moderate tandem stenoses bilateral anterior cerebral arteries. Moderate stenosis RIGHT M1 origin. Moderate to severe stenosis distal RIGHT M2. Focally occluded versus severe stenosis proximal RIGHT M3 segment. Severe stenosis LEFT M2 inferior  division origin. No large vessel occlusion, significant stenosis, contrast extravasation or aneurysm. POSTERIOR CIRCULATION: Patent vertebral arteries, vertebrobasilar junction and basilar artery, as well as main branch vessels with mild luminal irregularity. Moderate stenosis RIGHT V4 segment with mild poststenotic dilatation. Patent posterior cerebral arteries. Moderate to severe tandem stenoses RIGHT posterior cerebral artery. Moderate tandem stenosis LEFT posterior cerebral artery. No large vessel occlusion, significant stenosis, contrast extravasation or aneurysm. VENOUS SINUSES: Major dural venous sinuses are patent though not tailored for evaluation on this angiographic examination. ANATOMIC VARIANTS: None. DELAYED PHASE: Not performed. MIP images reviewed. CT Brain Perfusion Findings: CBF (<30%) Volume: 100mL Perfusion (Tmax>6.0s) volume: 51mL Mismatch Volume: 20mL Infarction Location:Perfusion mismatch bifrontal lobes, likely artifact. Prolonged mean transit time RIGHT basal ganglia and to lesser extent RIGHT MCA territory. IMPRESSION: CTA NECK: 1. No hemodynamically significant stenosis or acute vascular process in the neck. 2. 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 No pseudoaneurysm. 3. **An incidental finding of potential clinical significance has been found. Ectatic 3.6 cm ascending aorta. Recommend annual imaging followup by CTA or MRA. This recommendation follows 2010 ACCF/AHA/AATS/ACR/ASA/SCA/SCAI/SIR/STS/SVM Guidelines for the Diagnosis and Management of Patients with Thoracic Aortic Disease. Circulation.2010; 121: W431-V400** CTA HEAD: 1. No emergent large vessel occlusion. Focally occluded RIGHT M2/M3 segment. 2. Advanced intracranial atherosclerosis with multifocal high-grade stenosis including severe stenosis LEFT M2 segment, moderate to severe stenoses RIGHT middle cerebral artery and LEFT posterior cerebral artery. 3. 2 mm bilateral paraophthalmic aneurysms,  less likely infundibulum. CT PERFUSION: 1. No acute perfusion defect. 2. Prolonged mean transit time RIGHT MCA consistent with stenoses. Acute findings discussed with and reconfirmed by Dr.ASHISH ARORA on 11/25/2017 at 12:00 am. Electronically Signed   By: Elon Alas M.D.   On: 11/26/2017 00:03   Ct Cerebral Perfusion W Contrast  Result Date: 11/26/2017 CLINICAL DATA:  Follow-up code stroke. History of stroke, substance abuse, hypertension, hyperlipidemia. EXAM: CT ANGIOGRAPHY HEAD AND NECK CT PERFUSION BRAIN TECHNIQUE: Multidetector CT imaging of the head and neck was performed using the standard protocol during bolus administration of intravenous contrast. Multiplanar CT image reconstructions and MIPs were obtained to  evaluate the vascular anatomy. Carotid stenosis measurements (when applicable) are obtained utilizing NASCET criteria, using the distal internal carotid diameter as the denominator. Multiphase CT imaging of the brain was performed following IV bolus contrast injection. Subsequent parametric perfusion maps were calculated using RAPID software. CONTRAST:  195mL ISOVUE-370 IOPAMIDOL (ISOVUE-370) INJECTION 76% COMPARISON:  CT HEAD November 25, 2017 at 11:11 p.m. MRA head and neck January 07, 2007 FINDINGS: CTA NECK FINDINGS: AORTIC ARCH: 3.6 cm ascending aorta, normal branch pattern. Mild intimal thickening aortic arch. The origins of the innominate, left Common carotid artery and subclavian artery are widely patent. Mild stenosis mid LEFT subclavian artery. RIGHT CAROTID SYSTEM: Common carotid artery is widely patent, coursing in a straight line fashion. Normal appearance of the carotid bifurcation without hemodynamically significant stenosis by NASCET criteria. Tortuous course associated with hypertension. LEFT CAROTID SYSTEM: Common carotid artery is widely patent, coursing in a straight line fashion. Normal appearance of the carotid bifurcation without hemodynamically significant stenosis  by NASCET criteria. Tortuous course associated with hypertension, widely patent; mild luminal irregularity most compatible with atherosclerosis. VERTEBRAL ARTERIES:New, age indeterminate LEFT V1 non flow limiting dissection flap without pseudo aneurysm. Mild LEFT vertebral artery luminal irregularity. Mild calcific atherosclerosis RIGHT vertebral artery origin. Bilateral vertebral arteries are patent. SKELETON: No acute osseous process though bone windows have not been submitted. Poor dentition. OTHER NECK: Soft tissues of the neck are nonacute though, not tailored for evaluation. UPPER CHEST: Included lung apices are clear. No superior mediastinal lymphadenopathy. CTA HEAD FINDINGS: ANTERIOR CIRCULATION: Patent cervical internal carotid arteries, petrous, cavernous and supra clinoid internal carotid arteries. Calcific atherosclerosis resulting in moderate to severe stenosis RIGHT para clinoid internal carotid artery. Bilateral 2 mm paraophthalmic outpouchings (axial 103/352). Patent anterior communicating artery. Patent anterior and middle cerebral arteries. Moderate tandem stenoses bilateral anterior cerebral arteries. Moderate stenosis RIGHT M1 origin. Moderate to severe stenosis distal RIGHT M2. Focally occluded versus severe stenosis proximal RIGHT M3 segment. Severe stenosis LEFT M2 inferior division origin. No large vessel occlusion, significant stenosis, contrast extravasation or aneurysm. POSTERIOR CIRCULATION: Patent vertebral arteries, vertebrobasilar junction and basilar artery, as well as main branch vessels with mild luminal irregularity. Moderate stenosis RIGHT V4 segment with mild poststenotic dilatation. Patent posterior cerebral arteries. Moderate to severe tandem stenoses RIGHT posterior cerebral artery. Moderate tandem stenosis LEFT posterior cerebral artery. No large vessel occlusion, significant stenosis, contrast extravasation or aneurysm. VENOUS SINUSES: Major dural venous sinuses are patent  though not tailored for evaluation on this angiographic examination. ANATOMIC VARIANTS: None. DELAYED PHASE: Not performed. MIP images reviewed. CT Brain Perfusion Findings: CBF (<30%) Volume: 39mL Perfusion (Tmax>6.0s) volume: 52mL Mismatch Volume: 71mL Infarction Location:Perfusion mismatch bifrontal lobes, likely artifact. Prolonged mean transit time RIGHT basal ganglia and to lesser extent RIGHT MCA territory. IMPRESSION: CTA NECK: 1. No hemodynamically significant stenosis or acute vascular process in the neck. 2. 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 No pseudoaneurysm. 3. **An incidental finding of potential clinical significance has been found. Ectatic 3.6 cm ascending aorta. Recommend annual imaging followup by CTA or MRA. This recommendation follows 2010 ACCF/AHA/AATS/ACR/ASA/SCA/SCAI/SIR/STS/SVM Guidelines for the Diagnosis and Management of Patients with Thoracic Aortic Disease. Circulation.2010; 121: X324-M010** CTA HEAD: 1. No emergent large vessel occlusion. Focally occluded RIGHT M2/M3 segment. 2. Advanced intracranial atherosclerosis with multifocal high-grade stenosis including severe stenosis LEFT M2 segment, moderate to severe stenoses RIGHT middle cerebral artery and LEFT posterior cerebral artery. 3. 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 CT PERFUSION: 1. No acute perfusion  defect. 2. Prolonged mean transit time RIGHT MCA consistent with stenoses. Acute findings discussed with and reconfirmed by Dr.ASHISH ARORA on 11/25/2017 at 12:00 am. Electronically Signed   By: Elon Alas M.D.   On: 11/26/2017 00:03   Ct Head Code Stroke Wo Contrast  Result Date: 11/25/2017 CLINICAL DATA:  Code stroke. TIA. Initial evaluation. History of substance abuse, hypertension, hyperlipidemia, intracranial hemorrhage. EXAM: CT HEAD WITHOUT CONTRAST TECHNIQUE: Contiguous axial images were obtained from the base of the skull through the vertex  without intravenous contrast. COMPARISON:  MRI of the head June 08, 2013 FINDINGS: BRAIN: No intraparenchymal hemorrhage, mass effect nor midline shift. Age indeterminate LEFT corona radiata to the basal ganglia infarct. Old bilateral basal ganglia infarcts. Ex vacuo dilatation RIGHT greater than LEFT lateral ventricles. 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 Mild global parenchymal brain volume loss for age without hydrocephalus. Confluent supratentorial white matter hypodensities. No acute large vascular territory infarct. No abnormal extra-axial fluid collections. Basal cisterns are patent. VASCULAR: Mildly dense cerebral artery's compatible with hemoconcentration. Mild calcific atherosclerosis carotid siphon and included vertebral artery's. SKULL/SOFT TISSUES: No skull fracture. No significant soft tissue swelling. ORBITS/SINUSES: The included ocular globes and orbital contents are normal.Mild paranasal sinus mucosal thickening without air-fluid levels. Mastoid air cells are well aerated. OTHER: None. ASPECTS [facility] Stroke Program Early CT Score) - Ganglionic level infarction (caudate, lentiform nuclei, internal capsule, insula, M1-M3 cortex): 6 - Supraganglionic infarction (M4-M6 cortex): 3 Total score (0-10 with 10 being normal): 10 IMPRESSION: 1. Potentially acute LEFT corona radiata/basal ganglia infarct. 2. ASPECTS is 9. 3. Old bilateral basal ganglia infarcts. 4. Moderate to severe white matter changes compatible chronic small vessel ischemic disease. 5. Mild global parenchymal brain volume loss for age. Critical Value/emergent results were called by telephone at the time of interpretation on 11/25/2017 at 11:20 pm to Dr. Rory Percy, Neurology, who verbally acknowledged these results. Electronically Signed   By: Elon Alas M.D.   On: 11/25/2017 23:21        Scheduled Meds: . aspirin  300 mg Rectal Daily  . atorvastatin  80 mg Oral q1800  .  clopidogrel  75 mg Oral Daily  . enoxaparin (LOVENOX) injection  40 mg Subcutaneous Q24H   Continuous Infusions: . 0.9 % NaCl with KCl 40 mEq / L    . potassium chloride       LOS: 0 days     Vernell Leep, MD, FACP, [facility]. [facility] Pager [phone]  If 7PM-7AM, please contact night-coverage [email or web address] Password [email or web address] 11/26/2017, 1:55 PM

## 2017-11-27 ENCOUNTER — Inpatient Hospital Stay (HOSPITAL_COMMUNITY): Payer: Medicare HMO

## 2017-11-27 DIAGNOSIS — J690 Pneumonitis due to inhalation of food and vomit: Secondary | ICD-10-CM | POA: Diagnosis present

## 2017-11-27 DIAGNOSIS — I351 Nonrheumatic aortic (valve) insufficiency: Secondary | ICD-10-CM

## 2017-11-27 DIAGNOSIS — Z978 Presence of other specified devices: Secondary | ICD-10-CM

## 2017-11-27 LAB — BASIC METABOLIC PANEL
ANION GAP: 10 (ref 5–15)
BUN: 18 mg/dL (ref 6–20)
CALCIUM: 8.8 mg/dL — AB (ref 8.9–10.3)
CO2: 22 mmol/L (ref 22–32)
Chloride: 108 mmol/L (ref 101–111)
Creatinine, Ser: 2.37 mg/dL — ABNORMAL HIGH (ref 0.61–1.24)
GFR calc non Af Amer: 29 mL/min — ABNORMAL LOW (ref >60)
GFR, EST AFRICAN AMERICAN: 34 mL/min — AB (ref >60)
GLUCOSE: 105 mg/dL — AB (ref 65–99)
POTASSIUM: 3.7 mmol/L (ref 3.5–5.1)
Sodium: 140 mmol/L (ref 135–145)

## 2017-11-27 LAB — GLUCOSE, CAPILLARY
GLUCOSE-CAPILLARY: 110 mg/dL — AB (ref 65–99)
Glucose-Capillary: 94 mg/dL (ref 65–99)
Glucose-Capillary: 94 mg/dL (ref 65–99)

## 2017-11-27 LAB — PHOSPHORUS
PHOSPHORUS: 4.2 mg/dL (ref 2.5–4.6)
Phosphorus: 3.8 mg/dL (ref 2.5–4.6)

## 2017-11-27 LAB — BLOOD GAS, ARTERIAL
Acid-base deficit: 0.6 mmol/L (ref 0.0–2.0)
Bicarbonate: 23.7 mmol/L (ref 20.0–28.0)
DRAWN BY: 347621
FIO2: 40
O2 Saturation: 99.1 %
PATIENT TEMPERATURE: 98.6
PCO2 ART: 40.3 mmHg (ref 32.0–48.0)
PEEP: 5 cmH2O
PO2 ART: 186 mmHg — AB (ref 83.0–108.0)
RATE: 16 resp/min
VT: 530 mL
pH, Arterial: 7.388 (ref 7.350–7.450)

## 2017-11-27 LAB — HEMOGLOBIN A1C
Hgb A1c MFr Bld: 5.7 % — ABNORMAL HIGH (ref 4.8–5.6)
Mean Plasma Glucose: 117 mg/dL

## 2017-11-27 LAB — CBC
HEMATOCRIT: 38 % — AB (ref 39.0–52.0)
HEMOGLOBIN: 12.6 g/dL — AB (ref 13.0–17.0)
MCH: 23.7 pg — AB (ref 26.0–34.0)
MCHC: 33.2 g/dL (ref 30.0–36.0)
MCV: 71.4 fL — ABNORMAL LOW (ref 78.0–100.0)
Platelets: 200 10*3/uL (ref 150–400)
RBC: 5.32 MIL/uL (ref 4.22–5.81)
RDW: 15.2 % (ref 11.5–15.5)
WBC: 4.9 10*3/uL (ref 4.0–10.5)

## 2017-11-27 LAB — MRSA PCR SCREENING: MRSA BY PCR: INVALID — AB

## 2017-11-27 LAB — HIV ANTIBODY (ROUTINE TESTING W REFLEX): HIV SCREEN 4TH GENERATION: NONREACTIVE

## 2017-11-27 LAB — MAGNESIUM: MAGNESIUM: 2 mg/dL (ref 1.7–2.4)

## 2017-11-27 LAB — ECHOCARDIOGRAM COMPLETE
HEIGHTINCHES: 67 in
Weight: 2839.52 oz

## 2017-11-27 MED ORDER — VANCOMYCIN HCL IN DEXTROSE 1-5 GM/200ML-% IV SOLN
1000.0000 mg | INTRAVENOUS | Status: AC
  Administered 2017-11-27 – 2017-12-01 (×5): 1000 mg via INTRAVENOUS
  Filled 2017-11-27 (×5): qty 200

## 2017-11-27 MED ORDER — FAMOTIDINE IN NACL 20-0.9 MG/50ML-% IV SOLN
20.0000 mg | INTRAVENOUS | Status: DC
  Administered 2017-11-27 – 2017-12-10 (×14): 20 mg via INTRAVENOUS
  Filled 2017-11-27 (×14): qty 50

## 2017-11-27 MED ORDER — SODIUM CHLORIDE 0.9 % IV BOLUS (SEPSIS)
500.0000 mL | Freq: Once | INTRAVENOUS | Status: AC
  Administered 2017-11-27: 500 mL via INTRAVENOUS

## 2017-11-27 MED ORDER — ASPIRIN 325 MG PO TABS
325.0000 mg | ORAL_TABLET | Freq: Every day | ORAL | Status: DC
  Administered 2017-11-27 – 2017-12-02 (×6): 325 mg via ORAL
  Filled 2017-11-27 (×6): qty 1

## 2017-11-27 MED ORDER — VITAL HIGH PROTEIN PO LIQD
1000.0000 mL | ORAL | Status: DC
  Administered 2017-11-27: 1000 mL

## 2017-11-27 MED ORDER — VITAL HIGH PROTEIN PO LIQD
1000.0000 mL | ORAL | Status: DC
  Administered 2017-11-28 – 2017-11-30 (×3): 1000 mL
  Filled 2017-11-27 (×4): qty 1000

## 2017-11-27 MED ORDER — ORAL CARE MOUTH RINSE
15.0000 mL | OROMUCOSAL | Status: DC
  Administered 2017-11-27 – 2017-12-08 (×116): 15 mL via OROMUCOSAL

## 2017-11-27 MED ORDER — PIPERACILLIN-TAZOBACTAM 3.375 G IVPB 30 MIN
3.3750 g | Freq: Three times a day (TID) | INTRAVENOUS | Status: DC
Start: 2017-11-27 — End: 2017-11-27

## 2017-11-27 MED ORDER — PIPERACILLIN-TAZOBACTAM 3.375 G IVPB
3.3750 g | Freq: Three times a day (TID) | INTRAVENOUS | Status: AC
  Administered 2017-11-27 – 2017-12-01 (×14): 3.375 g via INTRAVENOUS
  Filled 2017-11-27 (×15): qty 50

## 2017-11-27 NOTE — Progress Notes (Signed)
OT Cancellation    11/27/17 0800  OT Visit Information  Last OT Received On 11/27/17  Reason Eval/Treat Not Completed Patient not medically ready (Pt intubated after orders initiated. Will return for OT evaluation once medically ready. Thank you.)   [place], OTR/L Acute Rehab Pager: [phone] Office: [phone]

## 2017-11-27 NOTE — Progress Notes (Signed)
Pt transported on vent to MRI and returned to 4N15. Pt's vitals remained stable throughout.

## 2017-11-27 NOTE — Progress Notes (Signed)
SLP Cancellation Note  Patient Details Name: [name] MRN: [id number] DOB: 1961/02/02   Cancelled treatment:       Reason Eval/Treat Not Completed: Medical issues which prohibited therapy. Patient intubated. Will f/u.   [place], CCC-SLP [phone]    [name] 11/27/2017, 8:58 AM

## 2017-11-27 NOTE — Progress Notes (Signed)
STROKE TEAM PROGRESS NOTE   SUBJECTIVE (INTERVAL HISTORY) Son and wife at bedside. Pt was intubated last night in ICU for not protecting airway. Had MRI this am showed left BG and CR infarcts. Therefore his asarthria and dysphagia likely due to pseudobulbar etiology. Currently on sedation, not responsive but vital stable.    Past Medical History:  Diagnosis Date  . Arthritis   . Chest pain    with a negative adenosine cardiolite in December 2006. Dr. Liana Crocker   . Cocaine abuse ([place])    last used in 2000; currently in remission; no IVDU to my knowledge  . Diastolic dysfunction    impaired L ventricle relatation with an EF of 65% and echo in December 2005  . Erectile dysfunction   . Hyperlipidemia    elevated transaminases on statin?-40's (2/06) - Hep B/C negative 4/06; resolution on 4/06 labs; off statin in 2006  . Hypertension    concentric LVH 2d echo 12/05; negative proteinuria 11/00  . 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 Onychomycosis April 2003   right big toe  . Prostate disorder    prostate irregularity with obstructive voiding symptoms (PSA 0.67); -likely related to overstimulation of alpha receptors at bladder neck; seen by Dr. Reece Agar in 9/05. no BPH at that visit  . Tinea versicolor    dermatology referral; secondary to no treatement response to selenium sulfide; currently resolive    HOME MEDICATIONS:  Current Meds  Medication Sig  . amLODipine (NORVASC) 10 MG tablet Take 1 tablet (10 mg total) by mouth every morning.  Marland Kitchen aspirin 81 MG tablet Take 1 tablet (81 mg total) by mouth daily.  . hydrochlorothiazide (HYDRODIURIL) 25 MG tablet Take 1 tablet (25 mg total) by mouth  every morning.  Marland Kitchen lisinopril (PRINIVIL,ZESTRIL) 20 MG tablet Take 1 tablet (20 mg total) by mouth daily.  . Multiple Vitamins-Minerals (HM MULTIVITAMIN ADULT GUMMY PO) Take 1 tablet by mouth every morning.     HOSPITAL MEDICATIONS:  . aspirin  325 mg Oral Daily  . atorvastatin  80 mg Oral q1800  . chlorhexidine gluconate (MEDLINE KIT)  15 mL Mouth Rinse BID  . clopidogrel  75 mg Oral Daily  . docusate  100 mg Per Tube BID  . enoxaparin (LOVENOX) injection  40 mg Subcutaneous Q24H  . mouth rinse  15 mL Mouth Rinse 10 times per day  . sennosides  5 mL Oral BID    OBJECTIVE Temp:  [98.1 F (36.7 C)-98.9 F (37.2 C)] 98.9 F (37.2 C) (01/14 1205) Pulse Rate:  [84-144] 85 (01/14 1300) Cardiac Rhythm: Normal sinus rhythm (01/14 1200) Resp:  [16-27] 16 (01/14 1300) BP: (94-245)/(74-148) 141/99 (01/14 1300) SpO2:  [96 %-100 %] 100 % (01/14 1300) FiO2 (%):  [40 %-60 %] 40 % (01/14 1055) Weight:  [177 lb 7.5 oz (80.5 kg)] 177 lb 7.5 oz (80.5 kg) (01/14 0356)  CBC:  Recent Labs  Lab 11/25/17 2246  11/26/17 0735 11/27/17 0449  WBC 3.8*  --  5.2 4.9  NEUTROABS 1.7  --   --   --   HGB 14.1   < > 13.9 12.6*  HCT 41.9   < > 41.8 38.0*  MCV 70.1*  --  70.3* 71.4*  PLT 242  --  249 200   < > = values in this interval not displayed.    Basic Metabolic Panel:  Recent Labs  Lab 11/26/17 0735 11/26/17 2229 11/27/17 0449  NA 138 140 140  K 3.2* 3.9 3.7  CL 103 106 108  CO2 23 21* 22  GLUCOSE 119* 113* 105*  BUN 9 15 18   CREATININE 1.39* 1.97* 2.37*  CALCIUM 9.3 9.2 8.8*  MG 1.9  --  2.0  PHOS  --   --  4.2    Lipid Panel:     Component Value Date/Time   CHOL 322 (H) 11/26/2017 0735   CHOL 295 (H) 05/29/2017 1537   TRIG 92 11/26/2017 2229   HDL 57 11/26/2017 0735   HDL 60 05/29/2017 1537   CHOLHDL 5.6 11/26/2017 0735   VLDL 14 11/26/2017 0735   LDLCALC 251 (H) 11/26/2017 0735   LDLCALC 224 (H) 05/29/2017 1537   HgbA1c:  Lab Results  Component Value Date   HGBA1C  5.8 (H) 06/08/2013   Urine Drug Screen:     Component Value Date/Time   LABOPIA NONE DETECTED 11/26/2017 0047   COCAINSCRNUR NONE DETECTED 11/26/2017 0047   LABBENZ NONE DETECTED 11/26/2017 0047   AMPHETMU NONE DETECTED 11/26/2017 0047   THCU NONE DETECTED 11/26/2017 0047   LABBARB NONE DETECTED 11/26/2017 0047    Alcohol Level     Component Value Date/Time   ETH <11 06/07/2013 1731    IMAGING I have personally reviewed the radiological images below and agree with the radiology interpretations.  CTA NECK:  1. No hemodynamically significant stenosis or acute vascular process in the neck.  2. 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 No pseudoaneurysm.  3. An incidental finding of potential clinical significance has been found.  Ectatic 3.6 cm ascending aorta.  Recommend annual imaging followup by CTA or MRA. This recommendation follows 2010 ACCF/AHA/AATS/ACR/ASA/SCA/SCAI/SIR/STS/SVM Guidelines for the Diagnosis and Management of Patients with Thoracic Aortic Disease. Circulation.2010; 121: N170-Y174  CTA HEAD:  1. No emergent large vessel occlusion. Focally occluded RIGHT M2/M3 segment.  2. Advanced intracranial atherosclerosis with multifocal high-grade stenosis including severe stenosis LEFT M2 segment, moderate to severe stenoses RIGHT middle cerebral artery and LEFT posterior cerebral artery.  3. 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   CT PERFUSION:  1. No acute perfusion defect.  2. Prolonged mean transit time RIGHT MCA consistent with stenoses.   CT Head Wo Contrast 11/25/2017 IMPRESSION: 1. Potentially acute LEFT corona radiata/basal ganglia infarct. 2. ASPECTS is 9. 3. Old bilateral basal ganglia infarcts. 4. Moderate to severe white matter changes compatible chronic small vessel ischemic disease. 5. Mild global parenchymal brain volume loss for age.  TTE - Left ventricle: The cavity size was normal.  Systolic function was   normal. The estimated ejection fraction was in the range of 60%   to 65%. Wall motion was normal; there were no regional wall   motion abnormalities. - Aortic valve: There was mild regurgitation. - Atrial septum: No defect or patent foramen ovale was identified. - Pulmonary arteries: PA peak pressure: 32 mm Hg (S).  Mr Brain Wo Contrast 11/27/2017 IMPRESSION: 1. Small acute infarcts scattered in the left corona radiata, some tracking toward the posterior left lentiform. No associated acute hemorrhage or mass effect. 2. Underlying severe chronic ischemic disease in the bilateral deep gray matter nuclei with advanced chronic signal changes in the cerebral white matter, and brainstem Wallerian degeneration. 3. Intubated. Right  nasoenteric tube in place. Fluid in the pharynx.    PHYSICAL EXAM  Temp:  [98.1 F (36.7 C)-98.9 F (37.2 C)] 98.9 F (37.2 C) (01/14 1205) Pulse Rate:  [84-144] 85 (01/14 1300) Resp:  [16-27] 16 (01/14 1300) BP: (94-245)/(74-148) 141/99 (01/14 1300) SpO2:  [96 %-100 %] 100 % (01/14 1300) FiO2 (%):  [40 %-60 %] 40 % (01/14 1055) Weight:  [177 lb 7.5 oz (80.5 kg)] 177 lb 7.5 oz (80.5 kg) (01/14 0356)  General - Well nourished, well developed, intubated on heavy sedation.  Ophthalmologic - Fundi not visualized due to pinpoint pupils.  Cardiovascular - Regular rate and rhythm.  Neuro - intubated on heavy sedation, not responsive, not following commands. Bilateral pinpoint pupils, not reactive to light, no corneal, weak gag and cough. On pain stimulation, unsustained spasm on the left UE and LE. Trace withdraw of RUE and RLE. DTR diminished, babinski positive on the left. Still has significant spasticity at LUE and LLE. Sensation, coordination and gait not tested.   ASSESSMENT/PLAN [name] is a 57 y.o. male with history of previous strokes, history of polysubstance abuse, hyperlipidemia, hypertension, right basal ganglia hemorrhage  in 1999 secondary to cocaine use, and left basal ganglia hemorrhage in December 2005, presenting with episodic aphasia vs. asarthria. He did not receive IV t-PA due to initial improvement in deficits and late presentation.  Stroke: suspected brainstem vs. b/l subcortical involvement - MRI to be performed when pt can lie flat. Stroke etiology likely due to atherosclerosis.  Resultant asarthria, severe dysphagia, intubated  CT head - Possible acute LEFT corona radiata/basal ganglia infarct.  MRI head - left BG and CR 2 small infarcts  CTA head and neck - right M1, M2, and left M2 stenosis  2D Echo - EF 60-65%  LDL - 251  HgbA1c - 5.7  UDS - negative   VTE prophylaxis - Lovenox Diet NPO time specified  aspirin 81 mg daily prior to admission, now on ASA 350m. Hold off plavix for now in case pt need trach or PEG procedure  Patient will be counseled to be compliant with his antithrombotic medications  Ongoing aggressive stroke risk factor management  Therapy recommendations:  pending  Disposition:  Pending  Respiratory distress  Due to pseudobulbar etiology  Intubated now  CCM on board  On zosyn and vanco   Asarthria and dysphagia   Pseudobulbar etiology with b/l involvement  On TF  May need PEG in the future  AKI  Cre 1.30->1.97->2.37  Likely due to dehydration due to perfuse sweating and lack of po fluid  On IVF @ 125  On TF  Close monitoring  History of stroke  First stroke in 1999 as per wife  06/2004 CT head showed old right BG encephalomalacia  12/2006 left BG ICH, MRA h/n unremarkable  77/9728- left PLIC and CR infarct   Hypertension BP stable Long-term BP goal normotensive  Hyperlipidemia  Home meds: lipitor 20.  LDL 251, goal < 70  Now on Lipitor 80 mg daily  Continue statin at discharge  Other Stroke Risk Factors  Former cigarette smoker - quit  ETOH use, advised to drink no more than 1 drink per day.  Hx of substance  abuse  Other Active Problems  Hypokalemia - resolved after supplement  History of polysubstance abuse - UDS negative   Hospital day # 1  This patient is critically ill due to respiratory distress, stroke, dysphagia, asarthria and at significant risk of neurological worsening, death  form recurrent stroke, seizure, aspiration pneumonia, heart failure, renal failure. This patient's care requires constant monitoring of vital signs, hemodynamics, respiratory and cardiac monitoring, review of multiple databases, neurological assessment, discussion with family, other specialists and medical decision making of high complexity. I had long discussion with son and wife at bedside, updated pt current condition, treatment plan and potential prognosis. They expressed understanding and appreciation. I also discussed with Dr. Carson Myrtle. I spent 45 minutes of neurocritical care time in the care of this patient.   Rosalin Hawking, MD PhD Stroke Neurology 11/27/2017 2:30 PM   To contact Stroke Continuity provider, please refer to [email or web address] After hours, contact General Neurology

## 2017-11-27 NOTE — Progress Notes (Signed)
TRH sign off note  Overnight events noted. PCCM care much appreciated. Patient now in ICU and intubated & mechanically ventilated d/t inability to protected airway from suspected CVA. Care assumed by PCCM. TRH will sign off at this time. Please contact [place] for any further assistance.  Vernell Leep, MD, FACP, [facility]. [facility] Pager [phone]  If 7PM-7AM, please contact night-coverage [email or web address] Password [facility] 11/27/2017, 7:18 AM

## 2017-11-27 NOTE — Progress Notes (Signed)
Pharmacy Antibiotic Note  [name] is a 57 y.o. male admitted on 11/25/2017 with pneumonia.  Pharmacy has been consulted for Vancomycin dosing. GN coverage not yet ordered. WBC is within normal limits. Patient is afebrile.   SCr up to 2.30 with est CrCl ~ 35 mL/min.   Plan: Vancomycin 1g IV every 24 hours F/u for need for antibiotics - clinical status not indicating Need GN coverage?  Monitor renal function, culture results, and clinical status  Height: 5\' 7"  (170.2 cm) Weight: 177 lb 7.5 oz (80.5 kg) IBW/kg (Calculated) : 66.1  Temp (24hrs), Avg:98.8 F (37.1 C), Min:98.1 F (36.7 C), Max:99.8 F (37.7 C)  Recent Labs  Lab 11/25/17 2246 11/25/17 2259 11/26/17 0735 11/26/17 2229 11/27/17 0449  WBC 3.8*  --  5.2  --  4.9  CREATININE 1.30* 1.30* 1.39* 1.97* 2.37*    Estimated Creatinine Clearance: 35.8 mL/min (A) (by C-G formula based on SCr of 2.37 mg/dL (H)).    No Known Allergies  Antimicrobials this admission: Vancomycin 1/14>>  Dose adjustments this admission:   Microbiology results: 1/14Sputum:   1/14 MRSA PCR: invalid  Thank you for allowing pharmacy to be a part of this patient's care.  Sloan Leiter, PharmD, BCPS, BCCCP Clinical Pharmacist Clinical phone 11/27/2017 until 3:30PM [phone] After hours, please call #[phone] 11/27/2017 9:40 AM

## 2017-11-27 NOTE — Progress Notes (Signed)
  Echocardiogram 2D Echocardiogram has been performed.  [name] [name] 11/27/2017, 8:49 AM

## 2017-11-27 NOTE — Progress Notes (Addendum)
[facility] Pulmonary Diseases & Critical Care Medicine Progress Note  Patient Name: [name] MRN: [id number] DOB: [birth year]    ADMISSION DATE:  11/25/2017 CONSULTATION DATE:  11/26/2017  REFERRING MD:  Dr. Vernell Leep  REASON FOR CONSULTATION:  Stroke, airway compromise   ASSESSMENT/PLAN:  ASSESSMENT  Patient Active Problem List   Diagnosis Date Noted  . Aspiration pneumonia ([place]) 11/27/2017    Priority: High  . Aphasia 11/26/2017    Priority: High  . Dysarthria     Priority: High  . Oropharyngeal dysphagia     Priority: High  . Bulbar palsy ([place])     Priority: High  . Stroke [facility]) 11/25/2017    Priority: High  . Endotracheally intubated 11/27/2017    Priority: Medium  . Essential hypertension, benign 09/18/2006    Priority: Medium  . Hypokalemia 11/26/2017    Priority: Low  . History of completed stroke 05/06/2011    Priority: Low  . Erectile dysfunction 10/04/2016  . Exertional chest pain 07/01/2015  . Low back pain radiating to both legs 02/13/2015  . Microcytosis 01/08/2013  . Osteoarthritis 03/27/2012  . BPH (benign prostatic hyperplasia) 06/06/2011  . Preventative health care 06/06/2011  . DRUG ABUSE, HX OF 09/19/2006  . Hyperlipidemia 09/18/2006    PLAN/RECOMMENDATIONS   Replete potassium  Start Zosyn/vancomycin for aspiration pneumonia (GPC/pairs, chains, Gram negative coccobacilli, GPR). Follow up culture results. May be able to use Rocephin alone.  Renal dosing of medications  Awaiting MRI  Maintain sedation for MRI. Then, titrate for RASS -2. Sedation vacation in am.   NUTRITION: start TF   DVT PROPHYLAXIS: Lovenox  GI PROPHYLAXIS: famotidine    SUBJECTIVE:   -Interim events: transferred to ICU(4N) for close monitoring of neurologic status with concerns regarding airway protection. Intubated upon arrival. Remains intubated and sedated with propofol. About to go to MRI.  HISTORY OF PRESENT ILLNESS This 57 year old  male was originally seen in consultation by Dr. Vernie Murders on 11/26/2017 for stroke. The patient was admitted on 11/25/2017 with acute speech difficulty and was diagnosed with an acute L basal ganglia infarct. 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 Neurology was consulted and was concerned for possible brainstem vs b/l subcortical infarct.  The patient has an extensive medical history, including cocaine abuse and prior ICH (R basal ganglia hemorrhage 1999, L basal ganglia hemorrhage 2005).  REVIEW OF SYSTEMS: unable to obtain due to intubated status.   PAST MEDICAL/SURGICAL/SOCIAL/FAMILY HISTORY  Past Medical History:  Diagnosis Date  . Arthritis   . Chest pain    with a negative adenosine cardiolite in December 2006. Dr. Liana Crocker   . Cocaine abuse ([place])    last used in 2000; currently in remission; no IVDU to my knowledge  . Diastolic dysfunction    impaired L ventricle relatation with an EF of 65% and echo in December 2005  . Erectile dysfunction   . Hyperlipidemia    elevated transaminases on statin?-40's (2/06) - Hep B/C negative 4/06; resolution on 4/06 labs; off statin in 2006  . Hypertension    concentric LVH 2d echo 12/05; negative proteinuria 11/00  . 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 Onychomycosis April 2003   right big toe  .  Prostate disorder    prostate irregularity with obstructive voiding symptoms (PSA 0.67); -likely related to overstimulation of alpha receptors at bladder neck; seen by Dr. Reece Agar in 9/05. no BPH at that visit  . Tinea versicolor    dermatology referral; secondary to no treatement response to selenium sulfide; currently resolive    Past  Surgical History:  Procedure Laterality Date  . COLONOSCOPY WITH PROPOFOL N/A 05/22/2013   Procedure: COLONOSCOPY WITH PROPOFOL;  Surgeon: Arta Silence, MD;  Location: [facility] END[facility];  Service: Endoscopy;  Laterality: N/A;    Social History   Tobacco Use  . Smoking status: Never Smoker  . Smokeless tobacco: Never Used  Substance Use Topics  . Alcohol use: Yes    Alcohol/week: 0.0 oz    Comment: Beer rarely.    Family History  Problem Relation Age of Onset  . Alcohol abuse Father   . Liver cancer Father   . Diabetes Father     Prior to Admission medications   Medication Sig Start Date End Date Taking? Authorizing Provider  amLODipine (NORVASC) 10 MG tablet Take 1 tablet (10 mg total) by mouth every morning. 05/29/17  Yes Tawny Asal, MD  aspirin 81 MG tablet Take 1 tablet (81 mg total) by mouth daily. 10/04/16  Yes Rivet, Sindy Guadeloupe, MD  hydrochlorothiazide (HYDRODIURIL) 25 MG tablet Take 1 tablet (25 mg total) by mouth every morning. 05/29/17  Yes Tawny Asal, MD  lisinopril (PRINIVIL,ZESTRIL) 20 MG tablet Take 1 tablet (20 mg total) by mouth daily. 05/29/17  Yes Tawny Asal, MD  Multiple Vitamins-Minerals (HM MULTIVITAMIN ADULT GUMMY PO) Take 1 tablet by mouth every morning.   Yes [provider]  atorvastatin (LIPITOR) 20 MG tablet Take 1 tablet (20 mg total) by mouth daily. Patient not taking: Reported on 11/25/2017 05/29/17   Tawny Asal, MD     No Known Allergies    Current Facility-Administered Medications:  .  0.9 %  sodium chloride infusion, 250 mL, Intravenous, PRN, Hammonds, Sharyn Blitz, MD, Last Rate: 10 mL/hr at 11/26/17 2230, 250 mL at 11/26/17 2230 .  0.9 %  sodium chloride infusion, , Intravenous, Continuous, Hammonds, Sharyn Blitz, MD, Last Rate: 125 mL/hr at 11/27/17 0005 .  acetaminophen (TYLENOL) tablet 650 mg, 650 mg, Oral, Q4H PRN **OR** acetaminophen (TYLENOL) solution 650 mg, 650 mg, Per Tube, Q4H PRN **OR** acetaminophen (TYLENOL) suppository 650  mg, 650 mg, Rectal, Q4H PRN, Opyd, Timothy S, MD .  albuterol (PROVENTIL) (2.5 MG/3ML) 0.083% nebulizer solution 2.5 mg, 2.5 mg, Nebulization, Q2H PRN, Hammonds, Sharyn Blitz, MD .  aspirin suppository 300 mg, 300 mg, Rectal, Daily, Rinehuls, David L, PA-C .  atorvastatin (LIPITOR) tablet 80 mg, 80 mg, Oral, q1800, Opyd, Timothy S, MD .  chlorhexidine gluconate (MEDLINE KIT) (PERIDEX) 0.12 % solution 15 mL, 15 mL, Mouth Rinse, BID, Hammonds, Sharyn Blitz, MD, 15 mL at 11/26/17 2015 .  clopidogrel (PLAVIX) tablet 75 mg, 75 mg, Oral, Daily, Opyd, Ilene Qua, MD, 75 mg at 11/26/17 1522 .  docusate (COLACE) 50 MG/5ML liquid 100 mg, 100 mg, Per Tube, BID, Hammonds, Sharyn Blitz, MD, 100 mg at 11/27/17 0028 .  enoxaparin (LOVENOX) injection 40 mg, 40 mg, Subcutaneous, Q24H, Opyd, Ilene Qua, MD, 40 mg at 11/26/17 1100 .  famotidine (PEPCID) IVPB 20 mg premix, 20 mg, Intravenous, Q12H, Hammonds, Sharyn Blitz, MD, Last Rate: 100 mL/hr at 11/26/17 2230, 20 mg at 11/26/17 2230 .  fentaNYL (SUBLIMAZE) injection 100 mcg, 100 mcg, Intravenous, Q15 min PRN, Hammonds, Sharyn Blitz,  MD .  fentaNYL (SUBLIMAZE) injection 100 mcg, 100 mcg, Intravenous, Q2H PRN, Hammonds, Sharyn Blitz, MD, 100 mcg at 11/26/17 2015 .  labetalol (NORMODYNE,TRANDATE) injection 5 mg, 5 mg, Intravenous, Q2H PRN, Opyd, Ilene Qua, MD, 5 mg at 11/26/17 1900 .  MEDLINE mouth rinse, 15 mL, Mouth Rinse, 10 times per day, Hammonds, Sharyn Blitz, MD, 15 mL at 11/27/17 0605 .  midazolam (VERSED) injection 2 mg, 2 mg, Intravenous, Q15 min PRN, Hammonds, Sharyn Blitz, MD, 2 mg at 11/26/17 2000 .  midazolam (VERSED) injection 2 mg, 2 mg, Intravenous, Q2H PRN, Hammonds, Sharyn Blitz, MD .  ondansetron (ZOFRAN) injection 4 mg, 4 mg, Intravenous, Q6H PRN, Hammonds, Sharyn Blitz, MD .  propofol (DIPRIVAN) 1000 MG/100ML infusion, 0-50 mcg/kg/min, Intravenous, Continuous, Hammonds, Sharyn Blitz, MD, Last Rate: 24.5 mL/hr at 11/27/17 0352, 50 mcg/kg/min at 11/27/17 0352 .   sennosides (SENOKOT) 8.8 MG/5ML syrup 5 mL, 5 mL, Oral, BID, Hammonds, Sharyn Blitz, MD, 5 mL at 11/27/17 0028   OBJECTIVE:   VITAL SIGNS: BP (!) 133/92   Pulse 90   Temp 98.1 F (36.7 C) (Axillary)   Resp 16   Ht _0  (1.702 m)   Wt 80.5 kg (177 lb 7.5 oz)   SpO2 100%   BMI 27.80 kg/m  Vitals:   11/27/17 0500 11/27/17 0600 11/27/17 0700 11/27/17 0724  BP: (!) 129/98 (!) 145/109 (!) 133/92 (!) 133/92  Pulse: 92 90 90 90  Resp: _1 Temp:      TempSrc:      SpO2: 100% 100% 99% 100%  Weight:      Height:        HEMODYNAMICS:    VENTILATOR SETTINGS: Vent Mode: PRVC FiO2 (%):  [40 %-60 %] 40 % Set Rate:  [16 bmp] 16 bmp Vt Set:  [530 mL] 530 mL PEEP:  [5 cmH20] 5 cmH20 Plateau Pressure:  [14 cmH20-19 cmH20] 18 cmH20  INTAKE / OUTPUT: I/O last 3 completed shifts: In: 1766.3 [I.V.:1416.3; IV Piggyback:350] Out: 830 [Urine:830]  PHYSICAL EXAMINATION: General: intubated and sedated Neuro: winces and withdraws to pain. PERRL HEENT: ETT in situ. Macroglossia. Cardiovascular: Regular S1 and S2 without murmur, rub or gallop Lungs: Good air entry. Coarse breath sounds bilaterally. Rales and rhonchi. Abdomen:  Soft NTND, BS+ Musculoskeletal: no LE edema GU: condom catheter in place  Skin: no rashes    LABS:  BMET Recent Labs  Lab 11/26/17 0735 11/26/17 2229 11/27/17 0449  NA 138 140 140  K 3.2* 3.9 3.7  CL 103 106 108  CO2 23 21* 22  BUN _2 CREATININE 1.39* 1.97* 2.37*  GLUCOSE 119* 113* 105*    Electrolytes Recent Labs  Lab 11/26/17 0735 11/26/17 2229 11/27/17 0449  CALCIUM 9.3 9.2 8.8*  MG 1.9  --  2.0  PHOS  --   --  4.2    CBC Recent Labs  Lab 11/25/17 2246 11/25/17 2259 11/26/17 0735 11/27/17 0449  WBC 3.8*  --  5.2 4.9  HGB 14.1 15.0 13.9 12.6*  HCT 41.9 44.0 41.8 38.0*  PLT 242  --  249 200    Coag's Recent Labs  Lab 11/25/17 2246  APTT 30  INR 0.98    Sepsis Markers No results for input(s): LATICACIDVEN,  PROCALCITON, O2SATVEN in the last 168 hours.  ABG Recent Labs  Lab 11/26/17 0920 11/26/17 2145 11/27/17 0420  PHART 7.424 7.362 7.388  PCO2ART 40.1 43.7 40.3  PO2ART 122* 249* 186*  Liver Enzymes Recent Labs  Lab 11/25/17 2246  AST 27  ALT 21  ALKPHOS 92  BILITOT 1.0  ALBUMIN 3.9    Cardiac Enzymes No results for input(s): TROPONINI, PROBNP in the last 168 hours.  Glucose Recent Labs  Lab 11/25/17 2256  GLUCAP 87    Imaging Dg Chest Port 1 View  Result Date: 11/27/2017 CLINICAL DATA:  CVA, intubated patient, EXAM: PORTABLE CHEST 1 VIEW COMPARISON:  Chest x-ray of November 26, 2017 FINDINGS: The lungs are better inflated today. There is linear increased density overlying the left heart border inferiorly. The cardiac silhouette is enlarged. The pulmonary vascularity is normal. There is no pleural effusion or pneumothorax. The endotracheal tube tip projects 2.7 cm above the carina. The esophagogastric tube tip and proximal port project below the GE junction. IMPRESSION: Improved aeration of both lungs. Subsegmental atelectasis at the left lung base. The support tubes are in reasonable position. Electronically Signed   By: David  Martinique M.D.   On: 11/27/2017 07:31   Dg Chest Port 1 View  Result Date: 11/26/2017 CLINICAL DATA:  Intubation. EXAM: PORTABLE CHEST 1 VIEW COMPARISON:  None. FINDINGS: The endotracheal tube is 3 cm above the carina. There is an NG tube coursing down the esophagus and into the stomach. The heart is mildly enlarged and the mediastinal and hilar contours are slightly prominent but probably within normal limits given the AP projection and portable technique. Low lung volumes with vascular crowding and streaky atelectasis. No definite infiltrates or effusions. The bony thorax is intact. IMPRESSION: Support apparatus in good position as discussed above. Low lung volumes with vascular crowding and atelectasis. Electronically Signed   By: Marijo Sanes M.D.    On: 11/26/2017 20:43   Dg Abd Portable 1v  Result Date: 11/26/2017 CLINICAL DATA:  Nasogastric tube placement. EXAM: PORTABLE ABDOMEN - 1 VIEW COMPARISON:  None. FINDINGS: Nasogastric tube coiled in the left upper abdomen with its tip in the proximal to mid stomach and side hole in the proximal stomach. The included portion of the abdomen demonstrates a normal bowel gas pattern. Enlarged cardiac silhouette with a tortuous aorta. Clear lungs with mildly prominent interstitial markings. Thoracic spine degenerative changes and mild scoliosis. IMPRESSION: 1. Nasogastric tube tip and side hole in the stomach. 2. Cardiomegaly and mild chronic interstitial lung disease. Electronically Signed   By: Claudie Revering M.D.   On: 11/26/2017 14:42    CULTURES: Results for orders placed or performed during the hospital encounter of 11/25/17  Culture, respiratory (tracheal aspirate)     Status: None (Preliminary result)   Collection Time: 11/27/17 12:12 AM  Result Value Ref Range Status   Specimen Description TRACHEAL ASPIRATE  Final   Special Requests NONE  Final   Gram Stain   Final    FEW WBC PRESENT, PREDOMINANTLY PMN MODERATE GRAM POSITIVE COCCI IN PAIRS IN CHAINS FEW GRAM NEGATIVE COCCOBACILLI RARE GRAM POSITIVE RODS    Culture PENDING  Incomplete   Report Status PENDING  Incomplete  MRSA PCR Screening     Status: Abnormal   Collection Time: 11/27/17 12:20 AM  Result Value Ref Range Status   MRSA by PCR INVALID RESULTS, SPECIMEN SENT FOR CULTURE (A) NEGATIVE Final    Comment: CALLED TO LILLEY,T RN [id number] 11/27/17 MITCHELL,L        The GeneXpert MRSA Assay (FDA approved for NASAL specimens only), is one component of a comprehensive MRSA colonization surveillance program. It is not intended to diagnose MRSA infection nor to guide  or monitor treatment for MRSA infections.     OTHER STUDIES:  -  ANTIBIOTICS: Zosyn 1/14 Vancomycin 1/14  SIGNIFICANT EVENTS: 1/12 >> Admitted 1/13 >>  transfer to ICU. Intubated for airway protection   My assessment, plan of care, findings, medications, side effects, etc. were discussed with:  Nurse  Respiratory Therapy  Family updated   - Inter-disciplinary family meet or Palliative Care meeting due by:  day [place], MD Board Certified by the ABIM, [place] Pager: [phone]  11/27/2017, 8:19 AM

## 2017-11-27 NOTE — Plan of Care (Signed)
Patient appears comfortable with sedation on vent.

## 2017-11-27 NOTE — Progress Notes (Signed)
Initial Nutrition Assessment  INTERVENTION:   Initiate TF: Vital High Protein @ 45 ml/hr (1080 ml) to provide 1080 kcal, 94.5 grams protein, and 907 ml water per day.  TF regimen and propofol at current rate providing 1729 total kcal/day (96% of kcal needs) and 94.5 grams protein (99% of protein needs).    NUTRITION DIAGNOSIS:   Inadequate oral intake related to inability to eat as evidenced by NPO status.  GOAL:   Patient will meet greater than or equal to 90% of their needs  MONITOR:   TF tolerance, I & O's, Vent status  REASON FOR ASSESSMENT:   Consult Enteral/tube feeding initiation and management, Assessment of nutrition requirement/status  ASSESSMENT:   Pt with PMH of HTN, CHF, hyperlipidemia, polysubstance abuse, CKD stage II, and multiple CVA. Pt admitted with stroke. Asarthria and dysphagia likely due to pseudobulbar etiology. Head CT showed possible acute left basal ganglia infarct. Emergently intubated 11/26/17 to protect airway due to difficulty managing secretions.  Patient is currently intubated on ventilator support MV: 8.2 L/min Temp (24hrs), Avg:98.6 F (37 C), Min:98.1 F (36.7 C), Max:98.9 F (37.2 C)  Propofol: 24.6 ml/hr which provides 649 kcal/day  Pt with NG tube extending below the GE junction. Per MD notes, pt may require PEG.  Medications reviewed and include Colace, Senokot, Pepsin, Propofol Labs reviewed and include glucose 113 (H), creatinine 1.97 (H) CBG's: 87, 295, 109  NUTRITION - FOCUSED PHYSICAL EXAM:    Most Recent Value  Orbital Region  No depletion  Upper Arm Region  No depletion  Thoracic and Lumbar Region  No depletion  Buccal Region  Unable to assess  Temple Region  No depletion  Clavicle Bone Region  No depletion  Clavicle and Acromion Bone Region  No depletion  Scapular Bone Region  Unable to assess  Dorsal Hand  No depletion  Patellar Region  No depletion  Anterior Thigh Region  No depletion  Posterior Calf Region  No  depletion  Edema (RD Assessment)  None  Hair  Reviewed  Eyes  Unable to assess  Mouth  Unable to assess  Skin  Reviewed  Nails  Reviewed       Diet Order:  Diet NPO time specified  EDUCATION NEEDS:   Not appropriate for education at this time  Skin:  Skin Assessment: Reviewed RN Assessment  Last BM:  11/25/17  Height:   Ht Readings from Last 1 Encounters:  11/25/17 5\' 7"  (1.702 m)    Weight:   Wt Readings from Last 1 Encounters:  11/27/17 177 lb 7.5 oz (80.5 kg)    Ideal Body Weight:  67.3 kg  BMI:  Body mass index is 27.8 kg/m.  Estimated Nutritional Needs:   Kcal:  1793 kcal/day   Protein:  95-105 g/day  Fluid:  1800 ml/day   Gaynell Face, MS, RD, LDN Pager: [phone] Weekend/After Hours: [phone]

## 2017-11-27 NOTE — Progress Notes (Signed)
PT Cancellation Note  Patient Details Name: [name] MRN: [id number] DOB: 1961-01-26   Cancelled Treatment:    Reason Eval/Treat Not Completed: Medical issues which prohibited therapy(pt intubated after orders initiated and await medical clearance to proceed)   Fenix Rorke B Jaimey Franchini 11/27/2017, 7:12 AM  [name], [place]

## 2017-11-28 ENCOUNTER — Inpatient Hospital Stay (HOSPITAL_COMMUNITY): Payer: Medicare HMO

## 2017-11-28 DIAGNOSIS — J9811 Atelectasis: Secondary | ICD-10-CM

## 2017-11-28 DIAGNOSIS — N179 Acute kidney failure, unspecified: Secondary | ICD-10-CM | POA: Diagnosis present

## 2017-11-28 LAB — GLUCOSE, CAPILLARY
GLUCOSE-CAPILLARY: 106 mg/dL — AB (ref 65–99)
GLUCOSE-CAPILLARY: 120 mg/dL — AB (ref 65–99)
GLUCOSE-CAPILLARY: 105 mg/dL — AB (ref 65–99)
Glucose-Capillary: 100 mg/dL — ABNORMAL HIGH (ref 65–99)
Glucose-Capillary: 110 mg/dL — ABNORMAL HIGH (ref 65–99)
Glucose-Capillary: 144 mg/dL — ABNORMAL HIGH (ref 65–99)

## 2017-11-28 LAB — CBC WITH DIFFERENTIAL/PLATELET
BASOS ABS: 0 10*3/uL (ref 0.0–0.1)
BASOS PCT: 0 %
Eosinophils Absolute: 0.3 10*3/uL (ref 0.0–0.7)
Eosinophils Relative: 4 %
HEMATOCRIT: 37.5 % — AB (ref 39.0–52.0)
HEMOGLOBIN: 12.2 g/dL — AB (ref 13.0–17.0)
Lymphocytes Relative: 14 %
Lymphs Abs: 0.9 10*3/uL (ref 0.7–4.0)
MCH: 23.2 pg — ABNORMAL LOW (ref 26.0–34.0)
MCHC: 32.5 g/dL (ref 30.0–36.0)
MCV: 71.4 fL — AB (ref 78.0–100.0)
Monocytes Absolute: 0.9 10*3/uL (ref 0.1–1.0)
Monocytes Relative: 14 %
NEUTROS ABS: 4.2 10*3/uL (ref 1.7–7.7)
NEUTROS PCT: 68 %
Platelets: 182 10*3/uL (ref 150–400)
RBC: 5.25 MIL/uL (ref 4.22–5.81)
RDW: 14.9 % (ref 11.5–15.5)
WBC: 6.3 10*3/uL (ref 4.0–10.5)

## 2017-11-28 LAB — BLOOD GAS, ARTERIAL
ACID-BASE DEFICIT: 1.5 mmol/L (ref 0.0–2.0)
Bicarbonate: 22.9 mmol/L (ref 20.0–28.0)
DRAWN BY: 44135
FIO2: 40
MECHVT: 530 mL
O2 Saturation: 98.5 %
PEEP: 5 cmH2O
PH ART: 7.368 (ref 7.350–7.450)
PO2 ART: 143 mmHg — AB (ref 83.0–108.0)
Patient temperature: 99.6
RATE: 16 resp/min
pCO2 arterial: 41.1 mmHg (ref 32.0–48.0)

## 2017-11-28 LAB — COMPREHENSIVE METABOLIC PANEL
ALBUMIN: 2.7 g/dL — AB (ref 3.5–5.0)
ALK PHOS: 74 U/L (ref 38–126)
ALT: 23 U/L (ref 17–63)
AST: 44 U/L — AB (ref 15–41)
Anion gap: 9 (ref 5–15)
BILIRUBIN TOTAL: 1.4 mg/dL — AB (ref 0.3–1.2)
BUN: 21 mg/dL — AB (ref 6–20)
CO2: 21 mmol/L — ABNORMAL LOW (ref 22–32)
CREATININE: 2.38 mg/dL — AB (ref 0.61–1.24)
Calcium: 8.7 mg/dL — ABNORMAL LOW (ref 8.9–10.3)
Chloride: 111 mmol/L (ref 101–111)
GFR calc Af Amer: 34 mL/min — ABNORMAL LOW (ref >60)
GFR, EST NON AFRICAN AMERICAN: 29 mL/min — AB (ref >60)
GLUCOSE: 125 mg/dL — AB (ref 65–99)
Potassium: 3.7 mmol/L (ref 3.5–5.1)
Sodium: 141 mmol/L (ref 135–145)
TOTAL PROTEIN: 6.1 g/dL — AB (ref 6.5–8.1)

## 2017-11-28 LAB — MAGNESIUM: Magnesium: 2.1 mg/dL (ref 1.7–2.4)

## 2017-11-28 LAB — PHOSPHORUS
PHOSPHORUS: 3.2 mg/dL (ref 2.5–4.6)
PHOSPHORUS: 2.7 mg/dL (ref 2.5–4.6)

## 2017-11-28 LAB — MRSA CULTURE: CULTURE: NOT DETECTED

## 2017-11-28 MED ORDER — POTASSIUM CHLORIDE 20 MEQ/15ML (10%) PO SOLN
40.0000 meq | Freq: Once | ORAL | Status: AC
  Administered 2017-11-28: 40 meq via ORAL
  Filled 2017-11-28: qty 30

## 2017-11-28 NOTE — Progress Notes (Signed)
PT Cancellation Note/ Discharge  Patient Details Name: [name] MRN: [id number] DOB: [birth year]   Cancelled Treatment:    Reason Eval/Treat Not Completed: Medical issues which prohibited therapy(pt remains intubated, not medically stable and per RN sign off until extubated or reorder)   [name] [name] 11/28/2017, 7:37 AM  [name], [place]

## 2017-11-28 NOTE — Progress Notes (Signed)
STROKE TEAM PROGRESS NOTE   SUBJECTIVE (INTERVAL HISTORY) Wife at bedside. Pt still intubated on sedation, with sedation off, pt has tachycardia and gag/cough. Able to briefly open eyes on voice, but not following commands. Discussed with Dr. Carson Myrtle, likely need early trach for airway protection.     Past Medical History:  Diagnosis Date  . Arthritis   . Chest pain    with a negative adenosine cardiolite in December 2006. Dr. Liana Crocker   . Cocaine abuse ([place])    last used in 2000; currently in remission; no IVDU to my knowledge  . Diastolic dysfunction    impaired L ventricle relatation with an EF of 65% and echo in December 2005  . Erectile dysfunction   . Hyperlipidemia    elevated transaminases on statin?-40's (2/06) - Hep B/C negative 4/06; resolution on 4/06 labs; off statin in 2006  . Hypertension    concentric LVH 2d echo 12/05; negative proteinuria 11/00  . 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 Onychomycosis April 2003   right big toe  . Prostate disorder    prostate irregularity with obstructive voiding symptoms (PSA 0.67); -likely related to overstimulation of alpha receptors at bladder neck; seen by Dr. Reece Agar in 9/05. no BPH at that visit  . Tinea versicolor    dermatology referral; secondary to no treatement response to selenium sulfide; currently resolive    HOME MEDICATIONS:  Current Meds  Medication Sig  . amLODipine (NORVASC) 10 MG tablet Take 1 tablet (10 mg total) by mouth every morning.  Marland Kitchen aspirin 81 MG tablet Take 1 tablet (81 mg total) by mouth daily.  . hydrochlorothiazide (HYDRODIURIL) 25 MG tablet Take 1 tablet (25 mg total) by mouth every morning.  Marland Kitchen  lisinopril (PRINIVIL,ZESTRIL) 20 MG tablet Take 1 tablet (20 mg total) by mouth daily.  . Multiple Vitamins-Minerals (HM MULTIVITAMIN ADULT GUMMY PO) Take 1 tablet by mouth every morning.     HOSPITAL MEDICATIONS:  . aspirin  325 mg Oral Daily  . atorvastatin  80 mg Oral q1800  . chlorhexidine gluconate (MEDLINE KIT)  15 mL Mouth Rinse BID  . docusate  100 mg Per Tube BID  . enoxaparin (LOVENOX) injection  40 mg Subcutaneous Q24H  . mouth rinse  15 mL Mouth Rinse 10 times per day  . sennosides  5 mL Oral BID    OBJECTIVE Temp:  [99 F (37.2 C)-100.1 F (37.8 C)] 100.1 F (37.8 C) (01/15 1200) Pulse Rate:  [77-109] 97 (01/15 1300) Cardiac Rhythm: Normal sinus rhythm (01/15 1200) Resp:  [16-25] 20 (01/15 1300) BP: (126-209)/(96-136) 179/116 (01/15 1300) SpO2:  [96 %-100 %] 97 % (01/15 1300) FiO2 (%):  [40 %] 40 % (01/15 1058) Weight:  [177 lb 11.1 oz (80.6 kg)] 177 lb 11.1 oz (80.6 kg) (01/15 0344)  CBC:  Recent Labs  Lab 11/25/17 2246  11/27/17 0449 11/28/17 0444  WBC 3.8*   < > 4.9 6.3  NEUTROABS 1.7  --   --  4.2  HGB 14.1   < > 12.6* 12.2*  HCT 41.9   < > 38.0* 37.5*  MCV 70.1*   < > 71.4* 71.4*  PLT 242   < > 200 182   < > = values  in this interval not displayed.    Basic Metabolic Panel:  Recent Labs  Lab 11/27/17 0449 11/27/17 1524 11/28/17 0444  NA 140  --  141  K 3.7  --  3.7  CL 108  --  111  CO2 22  --  21*  GLUCOSE 105*  --  125*  BUN 18  --  21*  CREATININE 2.37*  --  2.38*  CALCIUM 8.8*  --  8.7*  MG 2.0  --  2.1  PHOS 4.2 3.8 3.2    Lipid Panel:     Component Value Date/Time   CHOL 322 (H) 11/26/2017 0735   CHOL 295 (H) 05/29/2017 1537   TRIG 92 11/26/2017 2229   HDL 57 11/26/2017 0735   HDL 60 05/29/2017 1537   CHOLHDL 5.6 11/26/2017 0735   VLDL 14 11/26/2017 0735   LDLCALC 251 (H) 11/26/2017 0735   LDLCALC 224 (H) 05/29/2017 1537   HgbA1c:  Lab Results  Component Value Date   HGBA1C 5.7 (H) 11/26/2017   Urine Drug Screen:      Component Value Date/Time   LABOPIA NONE DETECTED 11/26/2017 0047   COCAINSCRNUR NONE DETECTED 11/26/2017 0047   LABBENZ NONE DETECTED 11/26/2017 0047   AMPHETMU NONE DETECTED 11/26/2017 0047   THCU NONE DETECTED 11/26/2017 0047   LABBARB NONE DETECTED 11/26/2017 0047    Alcohol Level     Component Value Date/Time   ETH <11 06/07/2013 1731    IMAGING I have personally reviewed the radiological images below and agree with the radiology interpretations.  CTA NECK:  1. No hemodynamically significant stenosis or acute vascular process in the neck.  2. 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 No pseudoaneurysm.  3. An incidental finding of potential clinical significance has been found.  Ectatic 3.6 cm ascending aorta.  Recommend annual imaging followup by CTA or MRA. This recommendation follows 2010 ACCF/AHA/AATS/ACR/ASA/SCA/SCAI/SIR/STS/SVM Guidelines for the Diagnosis and Management of Patients with Thoracic Aortic Disease. Circulation.2010; 121: S937-D428  CTA HEAD:  1. No emergent large vessel occlusion. Focally occluded RIGHT M2/M3 segment.  2. Advanced intracranial atherosclerosis with multifocal high-grade stenosis including severe stenosis LEFT M2 segment, moderate to severe stenoses RIGHT middle cerebral artery and LEFT posterior cerebral artery.  3. 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   CT PERFUSION:  1. No acute perfusion defect.  2. Prolonged mean transit time RIGHT MCA consistent with stenoses.   CT Head Wo Contrast 11/25/2017 IMPRESSION: 1. Potentially acute LEFT corona radiata/basal ganglia infarct. 2. ASPECTS is 9. 3. Old bilateral basal ganglia infarcts. 4. Moderate to severe white matter changes compatible chronic small vessel ischemic disease. 5. Mild global parenchymal brain volume loss for age.  TTE - Left ventricle: The cavity size was normal. Systolic function was   normal. The estimated  ejection fraction was in the range of 60%   to 65%. Wall motion was normal; there were no regional wall   motion abnormalities. - Aortic valve: There was mild regurgitation. - Atrial septum: No defect or patent foramen ovale was identified. - Pulmonary arteries: PA peak pressure: 32 mm Hg (S).  Mr Brain Wo Contrast 11/27/2017 IMPRESSION: 1. Small acute infarcts scattered in the left corona radiata, some tracking toward the posterior left lentiform. No associated acute hemorrhage or mass effect. 2. Underlying severe chronic ischemic disease in the bilateral deep gray matter nuclei with advanced chronic signal changes in the cerebral white matter, and brainstem Wallerian degeneration. 3. Intubated. Right nasoenteric  tube in place. Fluid in the pharynx.    PHYSICAL EXAM  Temp:  [99 F (37.2 C)-100.1 F (37.8 C)] 100.1 F (37.8 C) (01/15 1200) Pulse Rate:  [77-109] 97 (01/15 1300) Resp:  [16-25] 20 (01/15 1300) BP: (126-209)/(96-136) 179/116 (01/15 1300) SpO2:  [96 %-100 %] 97 % (01/15 1300) FiO2 (%):  [40 %] 40 % (01/15 1058) Weight:  [177 lb 11.1 oz (80.6 kg)] 177 lb 11.1 oz (80.6 kg) (01/15 0344)  General - Well nourished, well developed, intubated on sedation.  Ophthalmologic - Fundi not visualized due to pinpoint pupils.  Cardiovascular - Regular rate and rhythm.  Neuro - intubated on sedation, able to briefly open eyes on voice, not following commands. Bilateral pinpoint pupils, not reactive to light, weak corneals bilaterally, weak gag and cough. On pain stimulation, unsustained spasm on the left UE and LE. Trace withdraw of RUE and RLE. DTR diminished, babinski positive on the left. Still has significant spasticity at LUE and LLE. Sensation, coordination and gait not tested.   ASSESSMENT/PLAN Mr. ANDRU GENTER is a 57 y.o. male with history of previous strokes, history of polysubstance abuse, hyperlipidemia, hypertension, right basal ganglia hemorrhage in 1999 secondary to  cocaine use, and left basal ganglia hemorrhage in December 2005, presenting with episodic aphasia vs. asarthria. He did not receive IV t-PA due to initial improvement in deficits and late presentation.  Stroke: suspected brainstem vs. b/l subcortical involvement - MRI to be performed when pt can lie flat. Stroke etiology likely due to atherosclerosis.  Resultant asarthria, severe dysphagia, intubated  CT head - Possible acute LEFT corona radiata/basal ganglia infarct.  MRI head - left BG and CR 2 small infarcts  CTA head and neck - right M1, M2, and left M2 stenosis  2D Echo - EF 60-65%  LDL - 251  HgbA1c - 5.7  UDS - negative   VTE prophylaxis - Lovenox Diet NPO time specified  aspirin 81 mg daily prior to admission, now on ASA 39m. Hold off plavix for now in case pt need trach or PEG procedure  Patient will be counseled to be compliant with his antithrombotic medications  Ongoing aggressive stroke risk factor management  Therapy recommendations:  pending  Disposition:  Pending  Respiratory distress  Due to pseudobulbar etiology  Intubated now  CCM on board  On zosyn and vanco   Likely need early trach and PEG  Asarthria and dysphagia   Pseudobulbar etiology with b/l involvement  On TF  May need PEG in the future  AKI  Cre 1.30->1.97->2.37->2.38  Likely due to dehydration due to perfuse sweating and lack of po fluid  On IVF @ 61  On TF  Close monitoring  History of stroke  First stroke in 1999 as per wife  06/2004 CT head showed old right BG encephalomalacia  12/2006 left BG ICH, MRA h/n unremarkable  72/8786- left PLIC and CR infarct   Hypertension BP stable Long-term BP goal normotensive  Hyperlipidemia  Home meds: lipitor 20.  LDL 251, goal < 70  Now on Lipitor 80 mg daily  Continue statin at discharge  Other Stroke Risk Factors  Former cigarette smoker - quit  ETOH use, advised to drink no more than 1 drink per  day.  Hx of substance abuse  Other Active Problems  Hypokalemia - resolved after supplement  History of polysubstance abuse - UDS negative   Hospital day # 2  This patient is critically ill due to respiratory distress, stroke, dysphagia,  asarthria and at significant risk of neurological worsening, death form recurrent stroke, seizure, aspiration pneumonia, heart failure, renal failure. This patient's care requires constant monitoring of vital signs, hemodynamics, respiratory and cardiac monitoring, review of multiple databases, neurological assessment, discussion with family, other specialists and medical decision making of high complexity. I had discussion with son and wife at bedside, updated pt current condition, treatment plan and potential prognosis. They expressed understanding and appreciation. I also discussed with Dr. Carson Myrtle. I spent 35 minutes of neurocritical care time in the care of this patient.   Rosalin Hawking, MD PhD Stroke Neurology 11/28/2017 2:09 PM   To contact Stroke Continuity provider, please refer to [email or web address] After hours, contact General Neurology

## 2017-11-28 NOTE — Progress Notes (Addendum)
[facility] Pulmonary Diseases & Critical Care Medicine Progress Note  Patient Name: [name] MRN: [id number] DOB: 1961/02/08    ADMISSION DATE:  11/25/2017 CONSULTATION DATE:  11/26/2017  REFERRING MD:  Dr. Vernell Leep  REASON FOR CONSULTATION:  Stroke, airway compromise   ASSESSMENT/PLAN:  ASSESSMENT  Principal Problem:   Stroke [name]) Active Problems:   Aphasia   Dysarthria   Oropharyngeal dysphagia   Bulbar palsy (HCC)   Aspiration pneumonia (HCC)   Endotracheally intubated   Essential hypertension, benign   Acute kidney injury ([place])   Atelectasis   History of completed stroke   Hypokalemia   PLAN/RECOMMENDATIONS   Replete potassium  Decrease IV fluids: NS @ 60 mL/hr  Continue Zosyn/vancomycin for aspiration pneumonia (GPC/pairs, chains, Gram negative coccobacilli, GPR). Follow up culture results. May be able to use Rocephin alone.  Renal dosing of medications  Sedation vacation. Wean as tolerated.  I have spoken to the patient's wife about the potential role for tracheostomy and PEG placement. She asked appropriate questions and voiced understanding. She has requested time to think about the options (trial extubation when otherwise ready vs early tracheostomy vs "one-way" extubation)   NUTRITION: start TF   DVT PROPHYLAXIS: Lovenox  GI PROPHYLAXIS: famotidine    SUBJECTIVE:   -Interim events: Remains intubated and sedated with propofol. On sedation vacation. Tries to open eyes to voice. Not following commands. Moves all 4 extremities.  HISTORY OF PRESENT ILLNESS This 57 year old male was originally seen in consultation by Dr. Vernie Murders on 11/26/2017 for stroke. The patient was admitted on 11/25/2017 with acute speech difficulty and was diagnosed with an acute L basal ganglia infarct. 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 Neurology was consulted and was concerned for possible brainstem vs b/l subcortical infarct.  The patient  has an extensive medical history, including cocaine abuse and prior ICH (R basal ganglia hemorrhage 1999, L basal ganglia hemorrhage 2005).  REVIEW OF SYSTEMS: unable to obtain due to intubated status.   PAST MEDICAL/SURGICAL/SOCIAL/FAMILY HISTORY  Past Medical History:  Diagnosis Date  . Arthritis   . Chest pain    with a negative adenosine cardiolite in December 2006. Dr. Liana Crocker   . Cocaine abuse ([place])    last used in 2000; currently in remission; no IVDU to my knowledge  . Diastolic dysfunction    impaired L ventricle relatation with an EF of 65% and echo in December 2005  . Erectile dysfunction   . Hyperlipidemia    elevated transaminases on statin?-40's (2/06) - Hep B/C negative 4/06; resolution on 4/06 labs; off statin in 2006  . Hypertension    concentric LVH 2d echo 12/05; negative proteinuria 11/00  . 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 Onychomycosis April 2003   right big toe  . Prostate disorder    prostate irregularity with obstructive voiding symptoms (PSA 0.67); -likely related to overstimulation of alpha receptors at bladder neck; seen by Dr. Reece Agar in 9/05. no BPH at that visit  . Tinea versicolor    dermatology referral; secondary to no treatement response to selenium sulfide; currently resolive    Past Surgical History:  Procedure Laterality Date  . COLONOSCOPY WITH PROPOFOL N/A 05/22/2013   Procedure: COLONOSCOPY  WITH PROPOFOL;  Surgeon: Arta Silence, MD;  Location: [facility];  Service: Endoscopy;  Laterality: N/A;    Social History   Tobacco Use  . Smoking status: Never Smoker  . Smokeless tobacco: Never Used  Substance Use Topics  . Alcohol use:  Yes    Alcohol/week: 0.0 oz    Comment: Beer rarely.    Family History  Problem Relation Age of Onset  . Alcohol abuse Father   . Liver cancer Father   . Diabetes Father     Prior to Admission medications   Medication Sig Start Date End Date Taking? Authorizing Provider  amLODipine (NORVASC) 10 MG tablet Take 1 tablet (10 mg total) by mouth every morning. 05/29/17  Yes Tawny Asal, MD  aspirin 81 MG tablet Take 1 tablet (81 mg total) by mouth daily. 10/04/16  Yes Rivet, Sindy Guadeloupe, MD  hydrochlorothiazide (HYDRODIURIL) 25 MG tablet Take 1 tablet (25 mg total) by mouth every morning. 05/29/17  Yes Tawny Asal, MD  lisinopril (PRINIVIL,ZESTRIL) 20 MG tablet Take 1 tablet (20 mg total) by mouth daily. 05/29/17  Yes Tawny Asal, MD  Multiple Vitamins-Minerals (HM MULTIVITAMIN ADULT GUMMY PO) Take 1 tablet by mouth every morning.   Yes [provider]  atorvastatin (LIPITOR) 20 MG tablet Take 1 tablet (20 mg total) by mouth daily. Patient not taking: Reported on 11/25/2017 05/29/17   Tawny Asal, MD     No Known Allergies    Current Facility-Administered Medications:  .  0.9 %  sodium chloride infusion, 250 mL, Intravenous, PRN, Hammonds, Sharyn Blitz, MD, Stopped at 11/27/17 1235 .  0.9 %  sodium chloride infusion, , Intravenous, Continuous, Hammonds, Sharyn Blitz, MD, Last Rate: 125 mL/hr at 11/28/17 0800 .  acetaminophen (TYLENOL) tablet 650 mg, 650 mg, Oral, Q4H PRN **OR** acetaminophen (TYLENOL) solution 650 mg, 650 mg, Per Tube, Q4H PRN **OR** acetaminophen (TYLENOL) suppository 650 mg, 650 mg, Rectal, Q4H PRN, Opyd, Timothy S, MD .  albuterol (PROVENTIL) (2.5 MG/3ML) 0.083% nebulizer solution 2.5 mg, 2.5 mg, Nebulization, Q2H PRN, Hammonds, Sharyn Blitz, MD .  aspirin tablet 325 mg, 325 mg, Oral, Daily, Hammonds, Sharyn Blitz, MD, 325 mg at 11/27/17 1231 .  atorvastatin (LIPITOR) tablet 80 mg, 80 mg, Oral, q1800, Opyd, Ilene Qua, MD, 80 mg at 11/27/17 1837 .  chlorhexidine  gluconate (MEDLINE KIT) (PERIDEX) 0.12 % solution 15 mL, 15 mL, Mouth Rinse, BID, Hammonds, Sharyn Blitz, MD, 15 mL at 11/28/17 0725 .  docusate (COLACE) 50 MG/5ML liquid 100 mg, 100 mg, Per Tube, BID, Hammonds, Sharyn Blitz, MD, 100 mg at 11/27/17 2151 .  enoxaparin (LOVENOX) injection 40 mg, 40 mg, Subcutaneous, Q24H, Opyd, Ilene Qua, MD, 40 mg at 11/27/17 1231 .  famotidine (PEPCID) IVPB 20 mg premix, 20 mg, Intravenous, Q24H, Priscella Mann, RPH, Stopped at 11/27/17 2221 .  feeding supplement (VITAL HIGH PROTEIN) liquid 1,000 mL, 1,000 mL, Per Tube, Continuous, Renee Pain, MD, Last Rate: 45 mL/hr at 11/28/17 0800, 1,000 mL at 11/28/17 0800 .  fentaNYL (SUBLIMAZE) injection 100 mcg, 100 mcg, Intravenous, Q15 min PRN, Hammonds, Sharyn Blitz, MD .  fentaNYL (SUBLIMAZE) injection 100 mcg, 100 mcg, Intravenous, Q2H PRN, Hammonds, Sharyn Blitz, MD, 100 mcg at 11/26/17 2015 .  labetalol (NORMODYNE,TRANDATE) injection 5 mg, 5 mg, Intravenous, Q2H PRN, Opyd, Ilene Qua, MD, 5 mg at 11/28/17 0737 .  MEDLINE mouth rinse, 15 mL, Mouth Rinse, 10 times per day, Hammonds, Sharyn Blitz, MD, 15 mL at 11/28/17 [phone] .  midazolam (VERSED) injection  2 mg, 2 mg, Intravenous, Q2H PRN, Hammonds, Sharyn Blitz, MD .  ondansetron (ZOFRAN) injection 4 mg, 4 mg, Intravenous, Q6H PRN, Hammonds, Sharyn Blitz, MD .  piperacillin-tazobactam (ZOSYN) IVPB 3.375 g, 3.375 g, Intravenous, Q8H, Hammonds, Sharyn Blitz, MD, Last Rate: 12.5 mL/hr at 11/28/17 0633, 3.375 g at 11/28/17 7829 .  propofol (DIPRIVAN) 1000 MG/100ML infusion, 0-50 mcg/kg/min, Intravenous, Continuous, Hammonds, Sharyn Blitz, MD, Last Rate: 7.3 mL/hr at 11/28/17 0800, 14.91 mcg/kg/min at 11/28/17 0800 .  sennosides (SENOKOT) 8.8 MG/5ML syrup 5 mL, 5 mL, Oral, BID, Hammonds, Sharyn Blitz, MD, 5 mL at 11/27/17 2151 .  vancomycin (VANCOCIN) IVPB 1000 mg/200 mL premix, 1,000 mg, Intravenous, Q24H, Priscella Mann, RPH, Stopped at 11/27/17 1334   OBJECTIVE:   VITAL SIGNS: BP  (!) 209/136 (BP Location: Right Arm) Comment: gave pt 5 mg labetalol  Pulse (!) 105   Temp 99 F (37.2 C) (Axillary)   Resp (!) 23   Ht 5' 7"  (1.702 m)   Wt 80.6 kg (177 lb 11.1 oz)   SpO2 99%   BMI 27.83 kg/m  Vitals:   11/28/17 0500 11/28/17 0600 11/28/17 0700 11/28/17 0730  BP: (!) 152/112 (!) 141/108 (!) 177/129 (!) 209/136  Pulse: 88 89 (!) 105 (!) 105  Resp: 16 16 (!) 21 (!) 23  Temp:      TempSrc:      SpO2: 98% 98% 99% 99%  Weight:      Height:        HEMODYNAMICS:    VENTILATOR SETTINGS: Vent Mode: PRVC FiO2 (%):  [40 %] 40 % Set Rate:  [16 bmp] 16 bmp Vt Set:  [530 mL] 530 mL PEEP:  [5 cmH20] 5 cmH20 Plateau Pressure:  [15 cmH20-17 cmH20] 15 cmH20  INTAKE / OUTPUT: I/O last 3 completed shifts: In: 6385.7 [I.V.:4730.1; NG/GT:705.6; IV Piggyback:950] Out: 5621 [Urine:1675]  PHYSICAL EXAMINATION: General: intubated and sedated Neuro: winces and withdraws to pain. PERRL HEENT: ETT in situ. Macroglossia. Cardiovascular: Regular S1 and S2 without murmur, rub or gallop Lungs: Good air entry. Coarse breath sounds bilaterally. Rales and rhonchi. Abdomen:  Soft NTND, BS+ Musculoskeletal: no LE edema GU: condom catheter in place  Skin: no rashes    LABS:  BMET Recent Labs  Lab 11/26/17 2229 11/27/17 0449 11/28/17 0444  NA 140 140 141  K 3.9 3.7 3.7  CL 106 108 111  CO2 21* 22 21*  BUN 15 18 21*  CREATININE 1.97* 2.37* 2.38*  GLUCOSE 113* 105* 125*    Electrolytes Recent Labs  Lab 11/26/17 0735 11/26/17 2229 11/27/17 0449 11/27/17 1524 11/28/17 0444  CALCIUM 9.3 9.2 8.8*  --  8.7*  MG 1.9  --  2.0  --  2.1  PHOS  --   --  4.2 3.8 3.2    CBC Recent Labs  Lab 11/26/17 0735 11/27/17 0449 11/28/17 0444  WBC 5.2 4.9 6.3  HGB 13.9 12.6* 12.2*  HCT 41.8 38.0* 37.5*  PLT 249 200 182    Coag's Recent Labs  Lab 11/25/17 2246  APTT 30  INR 0.98    Sepsis Markers No results for input(s): LATICACIDVEN, PROCALCITON, O2SATVEN in the  last 168 hours.  ABG Recent Labs  Lab 11/26/17 2145 11/27/17 0420 11/28/17 0405  PHART 7.362 7.388 7.368  PCO2ART 43.7 40.3 41.1  PO2ART 249* 186* 143*    Liver Enzymes Recent Labs  Lab 11/25/17 2246 11/28/17 0444  AST 27 44*  ALT 21 23  ALKPHOS 92 74  BILITOT  1.0 1.4*  ALBUMIN 3.9 2.7*    Cardiac Enzymes No results for input(s): TROPONINI, PROBNP in the last 168 hours.  Glucose Recent Labs  Lab 11/25/17 2256 11/27/17 1634 11/27/17 2012 11/27/17 2329 11/28/17 0336 11/28/17 0718  GLUCAP 87 94 94 110* 110* 106*    Imaging Mr Brain Wo Contrast  Result Date: 11/27/2017 CLINICAL DATA:  57 year old male status post code stroke presentation. Prior infarcts. Left vertebral artery dissection, intracranial atherosclerosis, and occluded right M2/M3 MCA segment on CTA head and neck 2 days ago. EXAM: MRI HEAD WITHOUT CONTRAST TECHNIQUE: Multiplanar, multiecho pulse sequences of the brain and surrounding structures were obtained without intravenous contrast. COMPARISON:  CTA head and neck and CT brain perfusion 11/25/2017. Brain MRI 06/08/2013 FINDINGS: Brain: Small areas of restricted diffusion in the left corona radiata both anteriorly (series 4, image 32), and more pronounced posteriorly with some extension toward the left deep white matter capsules and lentiform. Associated T2 and FLAIR hyperintensity with no acute hemorrhage or mass effect. No other restricted diffusion but extensive chronic ischemic changes about the bilateral deep gray matter nuclei with chronic hemosiderin. Occasional other scattered chronic micro hemorrhages in the brain. Chronically advanced T2 and FLAIR hyperintensity in the cerebral white matter (greater in the right hemisphere) and also the pons. Chronic right side Wallerian degeneration is evident in the brainstem (series 7, image 9). The cerebellum appears to remain normal. No discrete cerebral cortical encephalomalacia is identified. Stable mild ex vacuo  lateral ventricle enlargement. Stable cerebral volume. No midline shift, mass effect, evidence of mass lesion, extra-axial collection or acute intracranial hemorrhage. Cervicomedullary junction and pituitary are within normal limits. Vascular: Major intracranial vascular flow voids are stable compared to 2,014. Skull and upper cervical spine: Negative visualized cervical spine. Normal bone marrow signal. Sinuses/Orbits: Normal orbits soft tissues. Paranasal sinus mucosal thickening. Mastoid air cells remain clear. Other: Intubated. Fluid in the pharynx. Right side nasoenteric tube in place. Visible internal auditory structures appear normal. Scalp and face soft tissues appear negative. IMPRESSION: 1. Small acute infarcts scattered in the left corona radiata, some tracking toward the posterior left lentiform. No associated acute hemorrhage or mass effect. 2. Underlying severe chronic ischemic disease in the bilateral deep gray matter nuclei with advanced chronic signal changes in the cerebral white matter, and brainstem Wallerian degeneration. 3. Intubated. Right nasoenteric tube in place. Fluid in the pharynx. Electronically Signed   By: Genevie Ann M.D.   On: 11/27/2017 11:15   Dg Chest Port 1 View  Result Date: 11/28/2017 CLINICAL DATA:  Intubation. EXAM: PORTABLE CHEST 1 VIEW COMPARISON:  11/27/2017. FINDINGS: Endotracheal tube and NG tube in stable position. Stable cardiomegaly. No pulmonary venous congestion. Bibasilar atelectasis unchanged. No pleural effusion or pneumothorax. IMPRESSION: 1.  Lines and tubes in stable position. 2.  Stable cardiomegaly. 3. Bibasilar atelectasis again noted. No significant interim change. Electronically Signed   By: Marcello Moores  Register   On: 11/28/2017 07:50    CULTURES: Results for orders placed or performed during the hospital encounter of 11/25/17  Culture, respiratory (tracheal aspirate)     Status: None (Preliminary result)   Collection Time: 11/27/17 12:12 AM  Result  Value Ref Range Status   Specimen Description TRACHEAL ASPIRATE  Final   Special Requests NONE  Final   Gram Stain   Final    FEW WBC PRESENT, PREDOMINANTLY PMN MODERATE GRAM POSITIVE COCCI IN PAIRS IN CHAINS FEW GRAM NEGATIVE COCCOBACILLI RARE GRAM POSITIVE RODS    Culture CULTURE REINCUBATED FOR BETTER  GROWTH  Final   Report Status PENDING  Incomplete  MRSA PCR Screening     Status: Abnormal   Collection Time: 11/27/17 12:20 AM  Result Value Ref Range Status   MRSA by PCR INVALID RESULTS, SPECIMEN SENT FOR CULTURE (A) NEGATIVE Final    Comment: CALLED TO LILLEY,T RN [phone] 11/27/17 MITCHELL,L        The GeneXpert MRSA Assay (FDA approved for NASAL specimens only), is one component of a comprehensive MRSA colonization surveillance program. It is not intended to diagnose MRSA infection nor to guide or monitor treatment for MRSA infections.     OTHER STUDIES:  -  ANTIBIOTICS: Zosyn 1/14 Vancomycin 1/14  SIGNIFICANT EVENTS: 1/12 >> Admitted 1/13 >> transfer to ICU. Intubated for airway protection   My assessment, plan of care, findings, medications, side effects, etc. were discussed with:  Nurse   - Inter-disciplinary family meet or Palliative Care meeting due by:  day [place], MD Board Certified by the [employer], [place] Pager: [phone]  11/28/2017, 8:29 AM  Cc: 45 min

## 2017-11-28 NOTE — Progress Notes (Signed)
OT Cancellation Note  Patient Details Name: [name] MRN: [id number] DOB: 03/13/1961   Cancelled Treatment:    Reason Eval/Treat Not Completed: Patient not medically ready. On vent. Sedated.   [place], OT/L  [phone] 11/28/2017 11/28/2017, 6:52 AM

## 2017-11-28 NOTE — Plan of Care (Signed)
Pt receiving tube feeds through NG tube.  Will continue to monitor   Katherine Mantle RN

## 2017-11-29 LAB — BLOOD GAS, ARTERIAL
Acid-Base Excess: 1.4 mmol/L (ref 0.0–2.0)
Bicarbonate: 25.6 mmol/L (ref 20.0–28.0)
Drawn by: 51702
FIO2: 40
LHR: 16 {breaths}/min
O2 Saturation: 98.7 %
PEEP: 5 cmH2O
PO2 ART: 141 mmHg — AB (ref 83.0–108.0)
Patient temperature: 99
VT: 530 mL
pCO2 arterial: 42.4 mmHg (ref 32.0–48.0)
pH, Arterial: 7.4 (ref 7.350–7.450)

## 2017-11-29 LAB — CULTURE, RESPIRATORY

## 2017-11-29 LAB — CBC WITH DIFFERENTIAL/PLATELET
BASOS PCT: 0 %
Basophils Absolute: 0 10*3/uL (ref 0.0–0.1)
EOS ABS: 0.2 10*3/uL (ref 0.0–0.7)
Eosinophils Relative: 4 %
HCT: 37.2 % — ABNORMAL LOW (ref 39.0–52.0)
Hemoglobin: 12.3 g/dL — ABNORMAL LOW (ref 13.0–17.0)
Lymphocytes Relative: 14 %
Lymphs Abs: 0.8 10*3/uL (ref 0.7–4.0)
MCH: 23.8 pg — ABNORMAL LOW (ref 26.0–34.0)
MCHC: 33.1 g/dL (ref 30.0–36.0)
MCV: 72 fL — ABNORMAL LOW (ref 78.0–100.0)
MONO ABS: 0.6 10*3/uL (ref 0.1–1.0)
MONOS PCT: 11 %
Neutro Abs: 4 10*3/uL (ref 1.7–7.7)
Neutrophils Relative %: 71 %
Platelets: 160 10*3/uL (ref 150–400)
RBC: 5.17 MIL/uL (ref 4.22–5.81)
RDW: 15.4 % (ref 11.5–15.5)
WBC: 5.6 10*3/uL (ref 4.0–10.5)

## 2017-11-29 LAB — GLUCOSE, CAPILLARY
GLUCOSE-CAPILLARY: 106 mg/dL — AB (ref 65–99)
GLUCOSE-CAPILLARY: 112 mg/dL — AB (ref 65–99)
GLUCOSE-CAPILLARY: 107 mg/dL — AB (ref 65–99)
Glucose-Capillary: 135 mg/dL — ABNORMAL HIGH (ref 65–99)
Glucose-Capillary: 107 mg/dL — ABNORMAL HIGH (ref 65–99)
Glucose-Capillary: 120 mg/dL — ABNORMAL HIGH (ref 65–99)

## 2017-11-29 LAB — CULTURE, RESPIRATORY W GRAM STAIN: Culture: NORMAL

## 2017-11-29 LAB — TRIGLYCERIDES: TRIGLYCERIDES: 63 mg/dL (ref <150)

## 2017-11-29 MED ORDER — AMLODIPINE BESYLATE 10 MG PO TABS
10.0000 mg | ORAL_TABLET | Freq: Every morning | ORAL | Status: DC
  Administered 2017-11-29 – 2017-12-02 (×4): 10 mg via ORAL
  Filled 2017-11-29 (×4): qty 1

## 2017-11-29 MED ORDER — LISINOPRIL 20 MG PO TABS
20.0000 mg | ORAL_TABLET | Freq: Every day | ORAL | Status: DC
  Administered 2017-11-29 – 2017-12-01 (×3): 20 mg via ORAL
  Filled 2017-11-29 (×3): qty 1

## 2017-11-29 NOTE — Progress Notes (Signed)
STROKE TEAM PROGRESS NOTE   SUBJECTIVE (INTERVAL HISTORY) No family at bedside. Pt still intubated with sedation off. Able to open eyes and following commands on the right side. Discussed with wife over the phone about early trach and she will come over tomorrow morning for discussion.   Past Medical History:  Diagnosis Date  . Arthritis   . Chest pain    with a negative adenosine cardiolite in December 2006. Dr. Liana Crocker   . Cocaine abuse ([place])    last used in 2000; currently in remission; no IVDU to my knowledge  . Diastolic dysfunction    impaired L ventricle relatation with an EF of 65% and echo in December 2005  . Erectile dysfunction   . Hyperlipidemia    elevated transaminases on statin?-40's (2/06) - Hep B/C negative 4/06; resolution on 4/06 labs; off statin in 2006  . Hypertension    concentric LVH 2d echo 12/05; negative proteinuria 11/00  . 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 Onychomycosis April 2003   right big toe  . Prostate disorder    prostate irregularity with obstructive voiding symptoms (PSA 0.67); -likely related to overstimulation of alpha receptors at bladder neck; seen by Dr. Reece Agar in 9/05. no BPH at that visit  . Tinea versicolor    dermatology referral; secondary to no treatement response to selenium sulfide; currently resolive    HOME MEDICATIONS:  Current Meds  Medication Sig  . amLODipine (NORVASC) 10 MG tablet Take 1 tablet (10 mg total) by mouth every morning.  Marland Kitchen aspirin 81 MG tablet Take 1 tablet (81 mg total) by mouth daily.  . hydrochlorothiazide (HYDRODIURIL) 25 MG tablet Take 1 tablet (25 mg total) by mouth every morning.  Marland Kitchen lisinopril  (PRINIVIL,ZESTRIL) 20 MG tablet Take 1 tablet (20 mg total) by mouth daily.  . Multiple Vitamins-Minerals (HM MULTIVITAMIN ADULT GUMMY PO) Take 1 tablet by mouth every morning.     HOSPITAL MEDICATIONS:  . amLODipine  10 mg Oral q morning - 10a  . aspirin  325 mg Oral Daily  . atorvastatin  80 mg Oral q1800  . chlorhexidine gluconate (MEDLINE KIT)  15 mL Mouth Rinse BID  . docusate  100 mg Per Tube BID  . enoxaparin (LOVENOX) injection  40 mg Subcutaneous Q24H  . lisinopril  20 mg Oral Daily  . mouth rinse  15 mL Mouth Rinse 10 times per day  . sennosides  5 mL Oral BID    OBJECTIVE Temp:  [99.2 F (37.3 C)-99.9 F (37.7 C)] 99.2 F (37.3 C) (01/16 1200) Pulse Rate:  [78-122] 100 (01/16 1400) Cardiac Rhythm: Sinus tachycardia (01/16 0800) Resp:  [13-26] 16 (01/16 1400) BP: (149-208)/(103-153) 176/119 (01/16 1400) SpO2:  [96 %-100 %] 98 % (01/16 1400) FiO2 (%):  [40 %] 40 % (01/16 1103) Weight:  [171 lb 8.3 oz (77.8 kg)] 171 lb 8.3 oz (77.8 kg) (01/16 0500)  CBC:  Recent Labs  Lab 11/28/17 0444 11/29/17 0222  WBC 6.3 5.6  NEUTROABS 4.2 4.0  HGB 12.2* 12.3*  HCT 37.5* 37.2*  MCV 71.4* 72.0*  PLT 182 086    Basic Metabolic Panel:  Recent Labs  Lab 11/27/17 0449  11/28/17 0444 11/28/17 1620  NA 140  --  141  --   K 3.7  --  3.7  --   CL 108  --  111  --   CO2 22  --  21*  --   GLUCOSE 105*  --  125*  --   BUN 18  --  21*  --   CREATININE 2.37*  --  2.38*  --   CALCIUM 8.8*  --  8.7*  --   MG 2.0  --  2.1  --   PHOS 4.2   < > 3.2 2.7   < > = values in this interval not displayed.    Lipid Panel:     Component Value Date/Time   CHOL 322 (H) 11/26/2017 0735   CHOL 295 (H) 05/29/2017 1537   TRIG 92 11/26/2017 2229   HDL 57 11/26/2017 0735   HDL 60 05/29/2017 1537   CHOLHDL 5.6 11/26/2017 0735   VLDL 14 11/26/2017 0735   LDLCALC 251 (H) 11/26/2017 0735   LDLCALC 224 (H) 05/29/2017 1537   HgbA1c:  Lab Results  Component Value Date   HGBA1C 5.7 (H)  11/26/2017   Urine Drug Screen:     Component Value Date/Time   LABOPIA NONE DETECTED 11/26/2017 0047   COCAINSCRNUR NONE DETECTED 11/26/2017 0047   LABBENZ NONE DETECTED 11/26/2017 0047   AMPHETMU NONE DETECTED 11/26/2017 0047   THCU NONE DETECTED 11/26/2017 0047   LABBARB NONE DETECTED 11/26/2017 0047    Alcohol Level     Component Value Date/Time   ETH <11 06/07/2013 1731    IMAGING I have personally reviewed the radiological images below and agree with the radiology interpretations.  CTA NECK:  1. No hemodynamically significant stenosis or acute vascular process in the neck.  2. 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 No pseudoaneurysm.  3. An incidental finding of potential clinical significance has been found.  Ectatic 3.6 cm ascending aorta.  Recommend annual imaging followup by CTA or MRA. This recommendation follows 2010 ACCF/AHA/AATS/ACR/ASA/SCA/SCAI/SIR/STS/SVM Guidelines for the Diagnosis and Management of Patients with Thoracic Aortic Disease. Circulation.2010; 121: E268-T419  CTA HEAD:  1. No emergent large vessel occlusion. Focally occluded RIGHT M2/M3 segment.  2. Advanced intracranial atherosclerosis with multifocal high-grade stenosis including severe stenosis LEFT M2 segment, moderate to severe stenoses RIGHT middle cerebral artery and LEFT posterior cerebral artery.  3. 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   CT PERFUSION:  1. No acute perfusion defect.  2. Prolonged mean transit time RIGHT MCA consistent with stenoses.   CT Head Wo Contrast 11/25/2017 IMPRESSION: 1. Potentially acute LEFT corona radiata/basal ganglia infarct. 2. ASPECTS is 9. 3. Old bilateral basal ganglia infarcts. 4. Moderate to severe white matter changes compatible chronic small vessel ischemic disease. 5. Mild global parenchymal brain volume loss for age.  TTE - Left ventricle: The cavity size was normal. Systolic  function was   normal. The estimated ejection fraction was in the range of 60%   to 65%. Wall motion was normal; there were no regional wall   motion abnormalities. - Aortic valve: There was mild regurgitation. - Atrial septum: No defect or patent foramen ovale was identified. - Pulmonary arteries: PA peak pressure: 32 mm Hg (S).  Mr Brain Wo Contrast 11/27/2017 IMPRESSION: 1. Small acute infarcts scattered in the left corona radiata, some tracking toward the posterior left lentiform. No associated acute hemorrhage or mass effect. 2. Underlying severe chronic ischemic disease in the bilateral deep gray matter nuclei with advanced chronic signal  changes in the cerebral white matter, and brainstem Wallerian degeneration. 3. Intubated. Right nasoenteric tube in place. Fluid in the pharynx.    PHYSICAL EXAM  Temp:  [99.2 F (37.3 C)-99.9 F (37.7 C)] 99.2 F (37.3 C) (01/16 1200) Pulse Rate:  [78-122] 100 (01/16 1400) Resp:  [13-26] 16 (01/16 1400) BP: (149-208)/(103-153) 176/119 (01/16 1400) SpO2:  [96 %-100 %] 98 % (01/16 1400) FiO2 (%):  [40 %] 40 % (01/16 1103) Weight:  [171 lb 8.3 oz (77.8 kg)] 171 lb 8.3 oz (77.8 kg) (01/16 0500)  General - Well nourished, well developed, intubated off sedation.  Ophthalmologic - Fundi not visualized due to pinpoint pupils.  Cardiovascular - Regular rate and rhythm.  Neuro - intubated off sedation, lethargic, able to open eyes on voice, not following commands for eye open close, but able to follow commands with right hand and right foot. Bilateral equal pupils, positive corneals bilaterally, weak gag and cough. On pain stimulation, unsustained spasm on the left UE and LE. RUE and RLE 2/5. DTR normal on the right, but significantly increased on the left, babinski positive on the left. Still has significant spasticity at LUE and LLE. Sensation, coordination and gait not tested.   ASSESSMENT/PLAN [name] is a 57 y.o. 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 asarthria. He did not receive IV t-PA due to initial improvement in deficits and late presentation.  Stroke: suspected brainstem vs. b/l subcortical involvement - MRI to be performed when pt can lie flat. Stroke etiology likely due to atherosclerosis.  Resultant asarthria, severe dysphagia, intubated  CT head - Possible acute LEFT corona radiata/basal ganglia infarct.  MRI head - left BG and CR 2 small infarcts  CTA head and neck - right M1, M2, and left M2 stenosis  2D Echo - EF 60-65%  LDL - 251  HgbA1c - 5.7  UDS - negative   VTE prophylaxis - Lovenox Diet NPO time specified  aspirin 81 mg daily prior to admission, now on ASA 368m. Hold off plavix for now in case pt need trach or PEG procedure  Patient will be counseled to be compliant with his antithrombotic medications  Ongoing aggressive stroke risk factor management  Therapy recommendations:  pending  Disposition:  Pending  Respiratory distress  Due to pseudobulbar etiology  Intubated now  CCM on board  On zosyn and vanco   Likely need early trach and PEG  Wife will discuss the possible trach tomorrow  Asarthria and dysphagia   Pseudobulbar etiology with b/l involvement  On TF  May need PEG in the future  AKI  Cre 1.30->1.97->2.37->2.38  Likely due to dehydration due to perfuse sweating and lack of po fluid  On IVF @ 60  On TF  Close monitoring  History of stroke  First stroke in 1999 as per wife  06/2004 CT head showed old right BG encephalomalacia  12/2006 left BG ICH, MRA h/n unremarkable  71/0258- left PLIC and CR infarct   Hypertension BP stable Long-term BP goal normotensive  Hyperlipidemia  Home meds: lipitor 20.  LDL 251, goal < 70  Now  on Lipitor 80 mg daily  Continue statin at discharge  Other Stroke Risk Factors  Former cigarette smoker - quit  ETOH use, advised to drink no more than 1 drink per day.  Hx of substance abuse  Other Active Problems  Hypokalemia - resolved after supplement  History of polysubstance abuse - UDS negative   Hospital day # 3  This patient is critically ill due to respiratory distress, stroke, dysphagia, asarthria and at significant risk of neurological worsening, death form recurrent stroke, seizure, aspiration pneumonia, heart failure, renal failure. This patient's care requires constant monitoring of vital signs, hemodynamics, respiratory and cardiac monitoring, review of multiple databases, neurological assessment, discussion with family, other specialists and medical decision making of high complexity. I had discussion with wife over the phone, updated pt current condition, treatment plan and potential prognosis. They expressed understanding and appreciation. I also discussed with Dr. Carson Myrtle. I spent 35 minutes of neurocritical care time in the care of this patient.   Rosalin Hawking, MD PhD Stroke Neurology 11/29/2017 2:19 PM   To contact Stroke Continuity provider, please refer to [email or web address] After hours, contact General Neurology

## 2017-11-29 NOTE — Progress Notes (Signed)
OT Cancellation    11/29/17 0700  OT Visit Information  Last OT Received On 11/29/17  Reason Eval/Treat Not Completed Patient not medically ready. Pt remains intubated. Will follow up as schedule allows and pt medically ready.   [place], OTR/L Acute Rehab Pager: [phone] Office: [phone]

## 2017-11-29 NOTE — Progress Notes (Addendum)
[facility] Pulmonary Diseases & Critical Care Medicine Progress Note  Patient Name: [name] MRN: [id number] DOB: [birth year]    ADMISSION DATE:  11/25/2017 CONSULTATION DATE:  11/26/2017  REFERRING MD:  Dr. Vernell Leep  REASON FOR CONSULTATION:  Stroke, airway compromise   ASSESSMENT/PLAN:  ASSESSMENT  Principal Problem:   Stroke [facility]) Active Problems:   Aphasia   Dysarthria   Oropharyngeal dysphagia   Bulbar palsy (HCC)   Aspiration pneumonia (HCC)   Endotracheally intubated   Essential hypertension, benign   Acute kidney injury ([place])   Atelectasis   History of completed stroke   Microcytic anemia   PLAN/RECOMMENDATIONS   Decrease IV fluids: NS @ 60 mL/hr  Continue Zosyn/vancomycin for aspiration pneumonia (GPC/pairs, chains, Gram negative coccobacilli, GPR). Follow up culture results. May be able to use Rocephin alone.  Renal dosing of medications  Sedation vacation. Wean as tolerated.  Wife plans to make a decision regarding tracheostomy by tomorrow.   NUTRITION: on TF   DVT PROPHYLAXIS: Lovenox  GI PROPHYLAXIS: famotidine    SUBJECTIVE:   -Interim events: Remains intubated and sedated with propofol. On sedation vacation. Follows commands, albeit with paraparesis of LUE. On PSV 5/5.  HISTORY OF PRESENT ILLNESS This 57 year old male was originally seen in consultation by Dr. Vernie Murders on 11/26/2017 for stroke. The patient was admitted on 11/25/2017 with acute speech difficulty and was diagnosed with an acute L basal ganglia infarct. 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 Neurology was consulted and was concerned for possible brainstem vs b/l subcortical infarct.  The patient has an extensive medical history, including cocaine abuse and prior ICH (R basal ganglia hemorrhage 1999, L basal ganglia hemorrhage 2005).  REVIEW OF SYSTEMS: unable to obtain due to intubated status.   PAST MEDICAL/SURGICAL/SOCIAL/FAMILY  HISTORY  Past Medical History:  Diagnosis Date  . Arthritis   . Chest pain    with a negative adenosine cardiolite in December 2006. Dr. Liana Crocker   . Cocaine abuse ([place])    last used in 2000; currently in remission; no IVDU to my knowledge  . Diastolic dysfunction    impaired L ventricle relatation with an EF of 65% and echo in December 2005  . Erectile dysfunction   . Hyperlipidemia    elevated transaminases on statin?-40's (2/06) - Hep B/C negative 4/06; resolution on 4/06 labs; off statin in 2006  . Hypertension    concentric LVH 2d echo 12/05; negative proteinuria 11/00  . 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 Onychomycosis April 2003   right big toe  . Prostate disorder    prostate irregularity with obstructive voiding symptoms (PSA 0.67); -likely related to overstimulation of alpha receptors at bladder neck; seen by Dr. Reece Agar in 9/05. no BPH at that visit  . Tinea versicolor    dermatology referral; secondary to no treatement response to selenium sulfide; currently resolive    Past Surgical History:  Procedure Laterality Date  . COLONOSCOPY WITH PROPOFOL N/A 05/22/2013   Procedure: COLONOSCOPY WITH PROPOFOL;  Surgeon: Arta Silence, MD;  Location: [facility] ENDOSCOPY;  Service: Endoscopy;  Laterality: N/A;    Social History   Tobacco Use  . Smoking status: Never Smoker  . Smokeless tobacco: Never Used  Substance Use Topics  . Alcohol use: Yes    Alcohol/week: 0.0 oz    Comment: Beer rarely.    Family History  Problem Relation Age of Onset  . Alcohol abuse Father   . Liver cancer Father   . Diabetes Father     Prior to Admission medications   Medication Sig Start  Date End Date Taking? Authorizing Provider  amLODipine (NORVASC) 10 MG tablet Take 1 tablet (10 mg total) by mouth every morning. 05/29/17  Yes Tawny Asal, MD  aspirin 81 MG tablet Take 1 tablet (81 mg total) by mouth daily. 10/04/16  Yes Rivet, Sindy Guadeloupe, MD  hydrochlorothiazide (HYDRODIURIL) 25 MG tablet Take 1 tablet (25 mg total) by mouth every morning. 05/29/17  Yes Tawny Asal, MD  lisinopril (PRINIVIL,ZESTRIL) 20 MG tablet Take 1 tablet (20 mg total) by mouth daily. 05/29/17  Yes Tawny Asal, MD  Multiple Vitamins-Minerals (HM MULTIVITAMIN ADULT GUMMY PO) Take 1 tablet by mouth every morning.   Yes [provider]  atorvastatin (LIPITOR) 20 MG tablet Take 1 tablet (20 mg total) by mouth daily. Patient not taking: Reported on 11/25/2017 05/29/17   Tawny Asal, MD     No Known Allergies    Current Facility-Administered Medications:  .  0.9 %  sodium chloride infusion, 250 mL, Intravenous, PRN, Hammonds, Sharyn Blitz, MD, Stopped at 11/27/17 1235 .  0.9 %  sodium chloride infusion, , Intravenous, Continuous, Renee Pain, MD, Last Rate: 60 mL/hr at 11/29/17 0700 .  acetaminophen (TYLENOL) tablet 650 mg, 650 mg, Oral, Q4H PRN **OR** acetaminophen (TYLENOL) solution 650 mg, 650 mg, Per Tube, Q4H PRN **OR** acetaminophen (TYLENOL) suppository 650 mg, 650 mg, Rectal, Q4H PRN, Opyd, Timothy S, MD .  albuterol (PROVENTIL) (2.5 MG/3ML) 0.083% nebulizer solution 2.5 mg, 2.5 mg, Nebulization, Q2H PRN, Hammonds, Sharyn Blitz, MD .  amLODipine (NORVASC) tablet 10 mg, 10 mg, Oral, q morning - 10a, Rosalin Hawking, MD .  aspirin tablet 325 mg, 325 mg, Oral, Daily, Hammonds, Sharyn Blitz, MD, 325 mg at 11/28/17 1041 .  atorvastatin (LIPITOR) tablet 80 mg, 80 mg, Oral, q1800, Opyd, Ilene Qua, MD, 80 mg at 11/28/17 1823 .  chlorhexidine gluconate (MEDLINE KIT) (PERIDEX) 0.12 % solution 15 mL, 15 mL, Mouth Rinse, BID, Hammonds, Sharyn Blitz, MD, 15 mL at 11/29/17 0726 .  docusate (COLACE) 50 MG/5ML  liquid 100 mg, 100 mg, Per Tube, BID, Hammonds, Sharyn Blitz, MD, 100 mg at 11/28/17 2319 .  enoxaparin (LOVENOX) injection 40 mg, 40 mg, Subcutaneous, Q24H, Opyd, Ilene Qua, MD, 40 mg at 11/28/17 1042 .  famotidine (PEPCID) IVPB 20 mg premix, 20 mg, Intravenous, Q24H, Priscella Mann, RPH, Stopped at 11/28/17 2349 .  feeding supplement (VITAL HIGH PROTEIN) liquid 1,000 mL, 1,000 mL, Per Tube, Continuous, Renee Pain, MD, Last Rate: 45 mL/hr at 11/29/17 0700, 1,000 mL at 11/29/17 0700 .  fentaNYL (SUBLIMAZE) injection 100 mcg, 100 mcg, Intravenous, Q15 min PRN, Hammonds, Sharyn Blitz, MD .  fentaNYL (SUBLIMAZE) injection 100 mcg, 100 mcg, Intravenous, Q2H PRN, Hammonds, Sharyn Blitz, MD, 100 mcg at 11/26/17 2015 .  labetalol (NORMODYNE,TRANDATE) injection 5 mg, 5 mg, Intravenous, Q2H PRN, Opyd, Ilene Qua, MD, 5 mg at 11/29/17 1245 .  lisinopril (PRINIVIL,ZESTRIL) tablet 20 mg, 20 mg, Oral, Daily, Rosalin Hawking, MD .  MEDLINE mouth rinse, 15 mL, Mouth Rinse, 10 times per day, Hammonds, Sharyn Blitz, MD, 15 mL at 11/29/17 0608 .  midazolam (VERSED) injection 2 mg, 2 mg, Intravenous, Q2H PRN, Hammonds, Nunzio Cory  H, MD, 2 mg at 11/29/17 0454 .  ondansetron (ZOFRAN) injection 4 mg, 4 mg, Intravenous, Q6H PRN, Hammonds, Sharyn Blitz, MD .  piperacillin-tazobactam (ZOSYN) IVPB 3.375 g, 3.375 g, Intravenous, Q8H, Hammonds, Sharyn Blitz, MD, Last Rate: 12.5 mL/hr at 11/29/17 0607, 3.375 g at 11/29/17 0607 .  propofol (DIPRIVAN) 1000 MG/100ML infusion, 0-50 mcg/kg/min, Intravenous, Continuous, Hammonds, Sharyn Blitz, MD, Stopped at 11/29/17 [id number] .  sennosides (SENOKOT) 8.8 MG/5ML syrup 5 mL, 5 mL, Oral, BID, Hammonds, Sharyn Blitz, MD, 5 mL at 11/28/17 2320 .  vancomycin (VANCOCIN) IVPB 1000 mg/200 mL premix, 1,000 mg, Intravenous, Q24H, Priscella Mann, RPH, Stopped at 11/28/17 1141   OBJECTIVE:   VITAL SIGNS: BP (!) 191/125   Pulse (!) 101   Temp 99.9 F (37.7 C) (Rectal)   Resp (!) 21   Ht 5' 7"  (1.702 m)    Wt 77.8 kg (171 lb 8.3 oz)   SpO2 98%   BMI 26.86 kg/m  Vitals:   11/29/17 0630 11/29/17 0700 11/29/17 0729 11/29/17 0804  BP: (!) 178/118 (!) 161/118  (!) 191/125  Pulse: 80 80  (!) 101  Resp: 16 16  (!) 21  Temp:   99.9 F (37.7 C)   TempSrc:   Rectal   SpO2: 99% 99%  98%  Weight:      Height:        HEMODYNAMICS:    VENTILATOR SETTINGS: Vent Mode: CPAP;PSV FiO2 (%):  [40 %] 40 % Set Rate:  [16 bmp] 16 bmp Vt Set:  [530 mL] 530 mL PEEP:  [5 cmH20] 5 cmH20 Pressure Support:  [5 cmH20] 5 cmH20 Plateau Pressure:  [12 cmH20-18 cmH20] 18 cmH20  INTAKE / OUTPUT: I/O last 3 completed shifts: In: 5583.7 [I.V.:3413.7; NG/GT:1620; IV Piggyback:550] Out: 5462 [VOJJK:0938]  PHYSICAL EXAMINATION: General: awake. intubated and sedated Neuro: follows commands sluggishly. Dense paresis of L UE. B Babinski. DTR 3+ @ R UE, 1+ @ LUE, 2+ @ R LE, 2+ @ L LE. HEENT: ETT in situ. Copious oral secretions. Macroglossia. Cardiovascular: Regular S1 and S2 without murmur, rub or gallop Lungs: Good air entry. Coarse breath sounds bilaterally. Rales and rhonchi. Abdomen:  Soft NTND, BS+ Musculoskeletal: no LE edema GU: condom catheter in place  Skin: no rashes    LABS:  BMET Recent Labs  Lab 11/26/17 2229 11/27/17 0449 11/28/17 0444  NA 140 140 141  K 3.9 3.7 3.7  CL 106 108 111  CO2 21* 22 21*  BUN 15 18 21*  CREATININE 1.97* 2.37* 2.38*  GLUCOSE 113* 105* 125*    Electrolytes Recent Labs  Lab 11/26/17 0735 11/26/17 2229  11/27/17 0449 11/27/17 1524 11/28/17 0444 11/28/17 1620  CALCIUM 9.3 9.2  --  8.8*  --  8.7*  --   MG 1.9  --   --  2.0  --  2.1  --   PHOS  --   --    < > 4.2 3.8 3.2 2.7   < > = values in this interval not displayed.    CBC Recent Labs  Lab 11/27/17 0449 11/28/17 0444 11/29/17 0222  WBC 4.9 6.3 5.6  HGB 12.6* 12.2* 12.3*  HCT 38.0* 37.5* 37.2*  PLT 200 182 160    Coag's Recent Labs  Lab 11/25/17 2246  APTT 30  INR 0.98     Sepsis Markers No results for input(s): LATICACIDVEN, PROCALCITON, O2SATVEN in the last 168 hours.  ABG Recent Labs  Lab 11/27/17 0420 11/28/17 0405 11/29/17 0340  PHART 7.388 7.368 7.400  PCO2ART 40.3 41.1 42.4  PO2ART 186* 143* 141*    Liver Enzymes Recent Labs  Lab 11/25/17 2246 11/28/17 0444  AST 27 44*  ALT 21 23  ALKPHOS 92 74  BILITOT 1.0 1.4*  ALBUMIN 3.9 2.7*    Cardiac Enzymes No results for input(s): TROPONINI, PROBNP in the last 168 hours.  Glucose Recent Labs  Lab 11/28/17 1145 11/28/17 1619 11/28/17 1931 11/28/17 2348 11/29/17 0332 11/29/17 0729  GLUCAP 144* 120* 105* 100* 106* 112*    Imaging No results found.  CULTURES: Results for orders placed or performed during the hospital encounter of 11/25/17  Culture, respiratory (tracheal aspirate)     Status: None (Preliminary result)   Collection Time: 11/27/17 12:12 AM  Result Value Ref Range Status   Specimen Description TRACHEAL ASPIRATE  Final   Special Requests NONE  Final   Gram Stain   Final    FEW WBC PRESENT, PREDOMINANTLY PMN MODERATE GRAM POSITIVE COCCI IN PAIRS IN CHAINS FEW GRAM NEGATIVE COCCOBACILLI RARE GRAM POSITIVE RODS    Culture CULTURE REINCUBATED FOR BETTER GROWTH  Final   Report Status PENDING  Incomplete  MRSA PCR Screening     Status: Abnormal   Collection Time: 11/27/17 12:20 AM  Result Value Ref Range Status   MRSA by PCR INVALID RESULTS, SPECIMEN SENT FOR CULTURE (A) NEGATIVE Final    Comment: CALLED TO LILLEY,T RN [phone] 11/27/17 MITCHELL,L        The GeneXpert MRSA Assay (FDA approved for NASAL specimens only), is one component of a comprehensive MRSA colonization surveillance program. It is not intended to diagnose MRSA infection nor to guide or monitor treatment for MRSA infections.   MRSA culture     Status: None   Collection Time: 11/27/17 12:20 AM  Result Value Ref Range Status   Specimen Description NASAL SWAB  Final   Special Requests NONE   Final   Culture NO MRSA DETECTED  Final   Report Status 11/28/2017 FINAL  Final    OTHER STUDIES:  -  ANTIBIOTICS: Zosyn 1/14 Vancomycin 1/14  SIGNIFICANT EVENTS: 1/12 >> Admitted 1/13 >> transfer to ICU. Intubated for airway protection   My assessment, plan of care, findings, medications, side effects, etc. were discussed with:  Nurse   - Inter-disciplinary family meet or Palliative Care meeting due by:  day Whiting, MD Board Certified by the ABIM, [place] Pager: [phone]  11/29/2017, 8:52 AM  Cc: 45 min

## 2017-11-29 NOTE — Progress Notes (Signed)
SLP Cancellation Note  Patient Details Name: QUINZELL MALCOMB MRN: [id number] DOB: 27-Mar-1961   Cancelled treatment:       Reason Eval/Treat Not Completed: Medical issues which prohibited therapy. Pt intubated   Berlene Dixson, Katherene Ponto 11/29/2017, 12:08 PM

## 2017-11-30 DIAGNOSIS — I63312 Cerebral infarction due to thrombosis of left middle cerebral artery: Secondary | ICD-10-CM

## 2017-11-30 LAB — CBC WITH DIFFERENTIAL/PLATELET
Basophils Absolute: 0 10*3/uL (ref 0.0–0.1)
Basophils Relative: 0 %
EOS ABS: 0.3 10*3/uL (ref 0.0–0.7)
EOS PCT: 6 %
HCT: 34.9 % — ABNORMAL LOW (ref 39.0–52.0)
HEMOGLOBIN: 11.2 g/dL — AB (ref 13.0–17.0)
LYMPHS PCT: 15 %
Lymphs Abs: 0.7 10*3/uL (ref 0.7–4.0)
MCH: 22.9 pg — ABNORMAL LOW (ref 26.0–34.0)
MCHC: 32.1 g/dL (ref 30.0–36.0)
MCV: 71.4 fL — ABNORMAL LOW (ref 78.0–100.0)
MONO ABS: 0.6 10*3/uL (ref 0.1–1.0)
Monocytes Relative: 14 %
NEUTROS PCT: 65 %
Neutro Abs: 2.9 10*3/uL (ref 1.7–7.7)
PLATELETS: 189 10*3/uL (ref 150–400)
RBC: 4.89 MIL/uL (ref 4.22–5.81)
RDW: 15 % (ref 11.5–15.5)
WBC: 4.5 10*3/uL (ref 4.0–10.5)

## 2017-11-30 LAB — RENAL FUNCTION PANEL
Albumin: 2.6 g/dL — ABNORMAL LOW (ref 3.5–5.0)
Anion gap: 9 (ref 5–15)
BUN: 22 mg/dL — ABNORMAL HIGH (ref 6–20)
CHLORIDE: 113 mmol/L — AB (ref 101–111)
CO2: 25 mmol/L (ref 22–32)
Calcium: 8.7 mg/dL — ABNORMAL LOW (ref 8.9–10.3)
Creatinine, Ser: 1.78 mg/dL — ABNORMAL HIGH (ref 0.61–1.24)
GFR calc Af Amer: 48 mL/min — ABNORMAL LOW (ref >60)
GFR calc non Af Amer: 41 mL/min — ABNORMAL LOW (ref >60)
GLUCOSE: 128 mg/dL — AB (ref 65–99)
PHOSPHORUS: 3.2 mg/dL (ref 2.5–4.6)
POTASSIUM: 3.6 mmol/L (ref 3.5–5.1)
Sodium: 147 mmol/L — ABNORMAL HIGH (ref 135–145)

## 2017-11-30 LAB — GLUCOSE, CAPILLARY
GLUCOSE-CAPILLARY: 126 mg/dL — AB (ref 65–99)
GLUCOSE-CAPILLARY: 118 mg/dL — AB (ref 65–99)
GLUCOSE-CAPILLARY: 122 mg/dL — AB (ref 65–99)
Glucose-Capillary: 130 mg/dL — ABNORMAL HIGH (ref 65–99)
Glucose-Capillary: 102 mg/dL — ABNORMAL HIGH (ref 65–99)
Glucose-Capillary: 98 mg/dL (ref 65–99)

## 2017-11-30 LAB — BLOOD GAS, ARTERIAL
ACID-BASE EXCESS: 3.6 mmol/L — AB (ref 0.0–2.0)
Bicarbonate: 27.8 mmol/L (ref 20.0–28.0)
Drawn by: 51133
FIO2: 40
LHR: 16 {breaths}/min
O2 SAT: 98.4 %
PEEP/CPAP: 5 cmH2O
PH ART: 7.418 (ref 7.350–7.450)
Patient temperature: 98.6
VT: 530 mL
pCO2 arterial: 44 mmHg (ref 32.0–48.0)
pO2, Arterial: 129 mmHg — ABNORMAL HIGH (ref 83.0–108.0)

## 2017-11-30 MED ORDER — MIDAZOLAM HCL 2 MG/2ML IJ SOLN
INTRAMUSCULAR | Status: AC
  Administered 2017-11-30: 2 mg
  Filled 2017-11-30: qty 2

## 2017-11-30 MED ORDER — FENTANYL CITRATE (PF) 100 MCG/2ML IJ SOLN
50.0000 ug | Freq: Once | INTRAMUSCULAR | Status: AC
  Administered 2017-11-30: 50 ug via INTRAVENOUS
  Filled 2017-11-30: qty 2

## 2017-11-30 MED ORDER — LIDOCAINE HCL (PF) 1 % IJ SOLN
INTRAMUSCULAR | Status: AC
  Administered 2017-11-30: 30 mL via NASAL
  Filled 2017-11-30: qty 30

## 2017-11-30 MED ORDER — LIDOCAINE HCL (PF) 1 % IJ SOLN: 30.0000 mL | Freq: Once | INTRAMUSCULAR | Status: DC

## 2017-11-30 NOTE — Progress Notes (Addendum)
[facility] Pulmonary Diseases & Critical Care Medicine Progress Note  Patient Name: [name] MRN: [id number] DOB: 1961/08/10    ADMISSION DATE:  11/25/2017 CONSULTATION DATE:  11/26/2017  REFERRING MD:  Dr. Vernell Leep  REASON FOR CONSULTATION:  Stroke, airway compromise   ASSESSMENT/PLAN:  ASSESSMENT  Principal Problem:   Stroke [facility]) Active Problems:   Aphasia   Dysarthria   Oropharyngeal dysphagia   Bulbar palsy (HCC)   Aspiration pneumonia (HCC)   Endotracheally intubated   Essential hypertension, benign   Acute kidney injury ([place])   Atelectasis   History of completed stroke   Microcytic anemia   PLAN/RECOMMENDATIONS   Hold TF today.  In light of his improved gag reflex and strong cough, I would favor attempting extubation rather proceeding directly to tracheostomy. Will await Dr. Phoebe Sharps assessment and will confer.  Stop Zosyn/vancomycin for aspiration pneumonia (GPC/pairs, chains, Gram negative coccobacilli, GPR) after 12/01/2017 doses.  Renal dosing of medications   NUTRITION: on TF   DVT PROPHYLAXIS: Lovenox  GI PROPHYLAXIS: famotidine    SUBJECTIVE:   -Interim events: Remains intubated and sedated with propofol. On sedation vacation. Follows commands, albeit with paraparesis of LUE. On PSV 5/5. Respiratory cultures isolated "normal respiratory flora".  HISTORY OF PRESENT ILLNESS This 57 year old male was originally seen in consultation by Dr. Vernie Murders on 11/26/2017 for stroke. The patient was admitted on 11/25/2017 with acute speech difficulty and was diagnosed with an acute L basal ganglia infarct. 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 Neurology was consulted and was concerned for possible brainstem vs b/l subcortical infarct.  The patient has an extensive medical history, including cocaine abuse and prior ICH (R basal ganglia hemorrhage 1999, L basal ganglia hemorrhage 2005).  REVIEW OF SYSTEMS: unable to obtain due to  intubated status.   PAST MEDICAL/SURGICAL/SOCIAL/FAMILY HISTORY  Past Medical History:  Diagnosis Date  . Arthritis   . Chest pain    with a negative adenosine cardiolite in December 2006. Dr. Liana Crocker   . Cocaine abuse ([place])    last used in 2000; currently in remission; no IVDU to my knowledge  . Diastolic dysfunction    impaired L ventricle relatation with an EF of 65% and echo in December 2005  . Erectile dysfunction   . Hyperlipidemia    elevated transaminases on statin?-40's (2/06) - Hep B/C negative 4/06; resolution on 4/06 labs; off statin in 2006  . Hypertension    concentric LVH 2d echo 12/05; negative proteinuria 11/00  . 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 Onychomycosis April 2003   right big toe  . Prostate disorder    prostate irregularity with obstructive voiding symptoms (PSA 0.67); -likely related to overstimulation of alpha receptors at bladder neck; seen by Dr. Reece Agar in 9/05. no BPH at that visit  . Tinea versicolor    dermatology referral; secondary to no treatement response to selenium sulfide; currently resolive    Past Surgical History:  Procedure Laterality Date  . COLONOSCOPY WITH PROPOFOL N/A 05/22/2013   Procedure: COLONOSCOPY WITH PROPOFOL;  Surgeon: Arta Silence, MD;  Location: [facility];  Service: Endoscopy;  Laterality: N/A;    Social History   Tobacco Use  . Smoking status: Never Smoker  .  Smokeless tobacco: Never Used  Substance Use Topics  . Alcohol use: Yes    Alcohol/week: 0.0 oz    Comment: Beer rarely.    Family History  Problem Relation Age of Onset  . Alcohol abuse Father   . Liver cancer Father   . Diabetes Father      Prior to Admission medications   Medication Sig Start Date End Date Taking? Authorizing Provider  amLODipine (NORVASC) 10 MG tablet Take 1 tablet (10 mg total) by mouth every morning. 05/29/17  Yes Tawny Asal, MD  aspirin 81 MG tablet Take 1 tablet (81 mg total) by mouth daily. 10/04/16  Yes Rivet, Sindy Guadeloupe, MD  hydrochlorothiazide (HYDRODIURIL) 25 MG tablet Take 1 tablet (25 mg total) by mouth every morning. 05/29/17  Yes Tawny Asal, MD  lisinopril (PRINIVIL,ZESTRIL) 20 MG tablet Take 1 tablet (20 mg total) by mouth daily. 05/29/17  Yes Tawny Asal, MD  Multiple Vitamins-Minerals (HM MULTIVITAMIN ADULT GUMMY PO) Take 1 tablet by mouth every morning.   Yes [provider]  atorvastatin (LIPITOR) 20 MG tablet Take 1 tablet (20 mg total) by mouth daily. Patient not taking: Reported on 11/25/2017 05/29/17   Tawny Asal, MD     No Known Allergies    Current Facility-Administered Medications:  .  0.9 %  sodium chloride infusion, 250 mL, Intravenous, PRN, Hammonds, Sharyn Blitz, MD, Stopped at 11/27/17 1235 .  0.9 %  sodium chloride infusion, , Intravenous, Continuous, Renee Pain, MD, Last Rate: 60 mL/hr at 11/30/17 0700 .  acetaminophen (TYLENOL) tablet 650 mg, 650 mg, Oral, Q4H PRN **OR** acetaminophen (TYLENOL) solution 650 mg, 650 mg, Per Tube, Q4H PRN **OR** acetaminophen (TYLENOL) suppository 650 mg, 650 mg, Rectal, Q4H PRN, Opyd, Timothy S, MD .  albuterol (PROVENTIL) (2.5 MG/3ML) 0.083% nebulizer solution 2.5 mg, 2.5 mg, Nebulization, Q2H PRN, Hammonds, Sharyn Blitz, MD .  amLODipine (NORVASC) tablet 10 mg, 10 mg, Oral, q morning - 10a, Rosalin Hawking, MD, 10 mg at 11/29/17 0905 .  aspirin tablet 325 mg, 325 mg, Oral, Daily, Hammonds, Sharyn Blitz, MD, 325 mg at 11/29/17 0905 .  atorvastatin (LIPITOR) tablet 80 mg, 80 mg, Oral, q1800, Opyd, Ilene Qua, MD, 80 mg at 11/29/17 1728 .  chlorhexidine gluconate (MEDLINE KIT) (PERIDEX) 0.12 % solution 15 mL, 15 mL, Mouth Rinse, BID,  Hammonds, Sharyn Blitz, MD, 15 mL at 11/30/17 0758 .  docusate (COLACE) 50 MG/5ML liquid 100 mg, 100 mg, Per Tube, BID, Hammonds, Sharyn Blitz, MD, 100 mg at 11/29/17 2044 .  famotidine (PEPCID) IVPB 20 mg premix, 20 mg, Intravenous, Q24H, Priscella Mann, RPH, Stopped at 11/29/17 2115 .  feeding supplement (VITAL HIGH PROTEIN) liquid 1,000 mL, 1,000 mL, Per Tube, Continuous, Renee Pain, MD, Last Rate: 45 mL/hr at 11/30/17 0700, 1,000 mL at 11/30/17 0700 .  fentaNYL (SUBLIMAZE) injection 100 mcg, 100 mcg, Intravenous, Q15 min PRN, Hammonds, Sharyn Blitz, MD, 100 mcg at 11/29/17 1109 .  fentaNYL (SUBLIMAZE) injection 100 mcg, 100 mcg, Intravenous, Q2H PRN, Hammonds, Sharyn Blitz, MD, 100 mcg at 11/29/17 1813 .  labetalol (NORMODYNE,TRANDATE) injection 5 mg, 5 mg, Intravenous, Q2H PRN, Opyd, Ilene Qua, MD, 5 mg at 11/30/17 0206 .  lisinopril (PRINIVIL,ZESTRIL) tablet 20 mg, 20 mg, Oral, Daily, Rosalin Hawking, MD, 20 mg at 11/29/17 0905 .  MEDLINE mouth rinse, 15 mL, Mouth Rinse, 10 times per day, Hammonds, Sharyn Blitz, MD, 15 mL at 11/30/17 0612 .  midazolam (VERSED) injection 2 mg, 2 mg, Intravenous, Q2H PRN, Hammonds, Nunzio Cory  H, MD, 2 mg at 11/29/17 0454 .  ondansetron (ZOFRAN) injection 4 mg, 4 mg, Intravenous, Q6H PRN, Hammonds, Sharyn Blitz, MD .  piperacillin-tazobactam (ZOSYN) IVPB 3.375 g, 3.375 g, Intravenous, Q8H, Hammonds, Sharyn Blitz, MD, Last Rate: 12.5 mL/hr at 11/30/17 0612, 3.375 g at 11/30/17 0612 .  propofol (DIPRIVAN) 1000 MG/100ML infusion, 0-50 mcg/kg/min, Intravenous, Continuous, Hammonds, Sharyn Blitz, MD, Stopped at 11/29/17 [phone] .  sennosides (SENOKOT) 8.8 MG/5ML syrup 5 mL, 5 mL, Oral, BID, Hammonds, Sharyn Blitz, MD, 5 mL at 11/29/17 2044 .  vancomycin (VANCOCIN) IVPB 1000 mg/200 mL premix, 1,000 mg, Intravenous, Q24H, Priscella Mann, RPH, Stopped at 11/29/17 1141   OBJECTIVE:   VITAL SIGNS: BP (!) 150/105   Pulse 90   Temp 98.6 F (37 C) (Oral)   Resp 14   Ht 5' 7"  (1.702  m)   Wt 80 kg (176 lb 5.9 oz)   SpO2 100%   BMI 27.62 kg/m  Vitals:   11/30/17 0500 11/30/17 0600 11/30/17 0700 11/30/17 0731  BP: (!) 144/103 (!) 156/102 (!) 150/105 (!) 150/105  Pulse: 77 77 78 90  Resp:   16 14  Temp:      TempSrc:      SpO2: 100% 100% 100% 100%  Weight:  80 kg (176 lb 5.9 oz)    Height:        HEMODYNAMICS:    VENTILATOR SETTINGS: Vent Mode: CPAP;PSV FiO2 (%):  [40 %] 40 % Set Rate:  [16 bmp] 16 bmp Vt Set:  [530 mL] 530 mL PEEP:  [5 cmH20] 5 cmH20 Pressure Support:  [5 cmH20] 5 cmH20 Plateau Pressure:  [14 cmH20-15 cmH20] 15 cmH20  INTAKE / OUTPUT: I/O last 3 completed shifts: In: 41 [I.V.:2282; NG/GT:1620; IV Piggyback:350] Out: 4220 [Urine:4220]  PHYSICAL EXAMINATION: General: awake. intubated and sedated Neuro: follows commands sluggishly. Strong cough. Improved gag. Dense paresis of L UE. B Babinski. DTR 3+ @ R UE, 1+ @ LUE, 2+ @ R LE, 2+ @ L LE. HEENT: ETT in situ. Copious oral secretions. Cardiovascular: Regular S1 and S2 without murmur, rub or gallop Lungs: Good air entry. Coarse breath sounds bilaterally. Rales and rhonchi. Abdomen:  Soft NTND, BS+ Musculoskeletal: no LE edema GU: condom catheter in place  Skin: no rashes    LABS:  BMET Recent Labs  Lab 11/27/17 0449 11/28/17 0444 11/30/17 0438  NA 140 141 147*  K 3.7 3.7 3.6  CL 108 111 113*  CO2 22 21* 25  BUN 18 21* 22*  CREATININE 2.37* 2.38* 1.78*  GLUCOSE 105* 125* 128*    Electrolytes Recent Labs  Lab 11/26/17 0735  11/27/17 0449  11/28/17 0444 11/28/17 1620 11/30/17 0438  CALCIUM 9.3   < > 8.8*  --  8.7*  --  8.7*  MG 1.9  --  2.0  --  2.1  --   --   PHOS  --   --  4.2   < > 3.2 2.7 3.2   < > = values in this interval not displayed.    CBC Recent Labs  Lab 11/28/17 0444 11/29/17 0222 11/30/17 0438  WBC 6.3 5.6 4.5  HGB 12.2* 12.3* 11.2*  HCT 37.5* 37.2* 34.9*  PLT 182 160 189    Coag's Recent Labs  Lab 11/25/17 2246  APTT 30  INR 0.98     Sepsis Markers No results for input(s): LATICACIDVEN, PROCALCITON, O2SATVEN in the last 168 hours.  ABG Recent Labs  Lab 11/28/17 0405 11/29/17 0340 11/30/17  0340  PHART 7.368 7.400 7.418  PCO2ART 41.1 42.4 44.0  PO2ART 143* 141* 129*    Liver Enzymes Recent Labs  Lab 11/25/17 2246 11/28/17 0444 11/30/17 0438  AST 27 44*  --   ALT 21 23  --   ALKPHOS 92 74  --   BILITOT 1.0 1.4*  --   ALBUMIN 3.9 2.7* 2.6*    Cardiac Enzymes No results for input(s): TROPONINI, PROBNP in the last 168 hours.  Glucose Recent Labs  Lab 11/29/17 0729 11/29/17 1113 11/29/17 1544 11/29/17 1931 11/29/17 2310 11/30/17 0304  GLUCAP 112* 135* 107* 107* 120* 130*    Imaging No results found.  CULTURES: Results for orders placed or performed during the hospital encounter of 11/25/17  Culture, respiratory (tracheal aspirate)     Status: None   Collection Time: 11/27/17 12:12 AM  Result Value Ref Range Status   Specimen Description TRACHEAL ASPIRATE  Final   Special Requests NONE  Final   Gram Stain   Final    FEW WBC PRESENT, PREDOMINANTLY PMN MODERATE GRAM POSITIVE COCCI IN PAIRS IN CHAINS FEW GRAM NEGATIVE COCCOBACILLI RARE GRAM POSITIVE RODS    Culture Consistent with normal respiratory flora.  Final   Report Status 11/29/2017 FINAL  Final  MRSA PCR Screening     Status: Abnormal   Collection Time: 11/27/17 12:20 AM  Result Value Ref Range Status   MRSA by PCR INVALID RESULTS, SPECIMEN SENT FOR CULTURE (A) NEGATIVE Final    Comment: CALLED TO LIL[place],T RN [phone] 11/27/17 MITCHELL,L        The GeneXpert MRSA Assay (FDA approved for NASAL specimens only), is one component of a comprehensive MRSA colonization surveillance program. It is not intended to diagnose MRSA infection nor to guide or monitor treatment for MRSA infections.   MRSA culture     Status: None   Collection Time: 11/27/17 12:20 AM  Result Value Ref Range Status   Specimen Description NASAL SWAB   Final   Special Requests NONE  Final   Culture NO MRSA DETECTED  Final   Report Status 11/28/2017 FINAL  Final    OTHER STUDIES:  -  ANTIBIOTICS: Zosyn 1/14 Vancomycin 1/14  SIGNIFICANT EVENTS: 1/12 >> Admitted 1/13 >> transfer to ICU. Intubated for airway protection   My assessment, plan of care, findings, medications, side effects, etc. were discussed with:  Nurse  Family (wife)   - Inter-disciplinary family meet or Palliative Care meeting due by:  day 7   Renee Pain, MD Board Certified by the ABIM, [place] Pager: [phone]  11/30/2017, 8:18 AM   ADDENDUM (11:27 AM): CTSP re: no cuff leak during preparations for extubation. By physical examination, the patient has copious oral secretions and macroglossia. I have explained to her that currently our options are: to watchfully wait and re-evaluate tomorrow; pursue bedside bronchoscopic evaluation to assess for macroglossia vs vocal cord edema vs large tube; pursue tracheostomy. Risks and benefits were reviewed. Procedures described. Questions were answered. The wife voice understanding of each of the options. She wishes to move forward with bronchoscopic evaluation at the bedside.

## 2017-11-30 NOTE — Progress Notes (Signed)
OT Cancellation Note  Patient Details Name: GLENDON DUNWOODY MRN: [id number] DOB: 1961/07/26   Cancelled Treatment:    Reason Eval/Treat Not Completed: Patient at procedure or test/ unavailable. Pt having trach. Will plan to seetomorrow if appropriate.   [place], OT/L  [phone] 11/30/2017 11/30/2017, 1:09 PM

## 2017-11-30 NOTE — Procedures (Signed)
  Name:  [name] MRN:  [id number] DOB:  23-Dec-1960  PROCEDURE NOTE  Procedure(s): Flexible bronchoscopy [phone])  Indications:  Laryngeal edema; no cuff leak from endotracheal tube; macroglossia  Consent:  Written informed consent was obtained prior to the procedure. The risks of the procedure including coughing, bleeding and the small chance of lung puncture requiring chest tube placement were discussed in detail. The benefits & alternatives including serial follow up were also discussed.  Anesthesia:  Versed 2 mg IV, fentanyl 50 mcg IV  Procedure summary:  Appropriate equipment was assembled.  The patient was  identified as [name]. Interim history obtained and brought to the operating room. Safety timeout was performed. The patient was placed supine on the operating table, airway established and general anesthesia administered by the Anesthesia team.   After the appropriate level of anesthesia was assured, flexible video bronchoscope was lubricated and inserted through the L naris.  Airway examination was performed bilaterally to the level of the vocal cords.  Copious clear secretions were noted, mucosa appeared lump and edematous.  The patient was extubated in operating room and transferred to PACU. Post-procedure chest x-ray was ordered.  Specimens sent: no specimes collected   Complications:  No immediate complications were noted.  Hemodynamic parameters and oxygenation remained stable throughout the procedure.  Estimated blood loss:  Less then 5 mL.   Renee Pain, MD  [place] [facility] Pager [phone]   11/30/2017, 12:24 PM

## 2017-11-30 NOTE — Progress Notes (Signed)
OT Cancellation Note  Patient Details Name: [name] MRN: [id number] DOB: 1961/05/24   Cancelled Treatment:    Reason Eval/Treat Not Completed: Other (comment). Pt remians intubated with possible plans to extubate this pm. Will check back this pm and assess if appropriate.   [place], OT/L  [phone] 11/30/2017 11/30/2017, 9:05 AM

## 2017-11-30 NOTE — Progress Notes (Signed)
STROKE TEAM PROGRESS NOTE   SUBJECTIVE (INTERVAL HISTORY) Wife is at bedside. Pt still intubated with sedation off. Able to open eyes and following limited commands on the right hand. BP elevated and tachycardia, intermittent fentanyl and labetalol. Discussed with wife at bedside, will do trial of extubation. If not tolerating, will re-intubate and consider trach after.    Past Medical History:  Diagnosis Date  . Arthritis   . Chest pain    with a negative adenosine cardiolite in December 2006. Dr. Liana Crocker   . Cocaine abuse ([place])    last used in 2000; currently in remission; no IVDU to my knowledge  . Diastolic dysfunction    impaired L ventricle relatation with an EF of 65% and echo in December 2005  . Erectile dysfunction   . Hyperlipidemia    elevated transaminases on statin?-40's (2/06) - Hep B/C negative 4/06; resolution on 4/06 labs; off statin in 2006  . Hypertension    concentric LVH 2d echo 12/05; negative proteinuria 11/00  . 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 Onychomycosis April 2003   right big toe  . Prostate disorder    prostate irregularity with obstructive voiding symptoms (PSA 0.67); -likely related to overstimulation of alpha receptors at bladder neck; seen by Dr. Reece Agar in 9/05. no BPH at that visit  . Tinea versicolor    dermatology referral; secondary to no treatement response to selenium sulfide; currently resolive    HOME MEDICATIONS:  Current Meds  Medication Sig  . amLODipine (NORVASC) 10 MG tablet Take 1 tablet (10 mg total) by mouth every morning.  Marland Kitchen aspirin 81 MG tablet Take 1 tablet (81 mg total) by mouth daily.  . hydrochlorothiazide (HYDRODIURIL)  25 MG tablet Take 1 tablet (25 mg total) by mouth every morning.  Marland Kitchen lisinopril (PRINIVIL,ZESTRIL) 20 MG tablet Take 1 tablet (20 mg total) by mouth daily.  . Multiple Vitamins-Minerals (HM MULTIVITAMIN ADULT GUMMY PO) Take 1 tablet by mouth every morning.     HOSPITAL MEDICATIONS:  . amLODipine  10 mg Oral q morning - 10a  . aspirin  325 mg Oral Daily  . atorvastatin  80 mg Oral q1800  . chlorhexidine gluconate (MEDLINE KIT)  15 mL Mouth Rinse BID  . docusate  100 mg Per Tube BID  . lisinopril  20 mg Oral Daily  . mouth rinse  15 mL Mouth Rinse 10 times per day  . sennosides  5 mL Oral BID    OBJECTIVE Temp:  [98.6 F (37 C)-100 F (37.8 C)] 99.1 F (37.3 C) (01/17 0814) Pulse Rate:  [77-122] 101 (01/17 0814) Cardiac Rhythm: Normal sinus rhythm (01/17 0800) Resp:  [13-26] 15 (01/17 0814) BP: (141-222)/(97-153) 179/145 (01/17 0814) SpO2:  [94 %-100 %] 100 % (01/17 0731) FiO2 (%):  [40 %] 40 % (01/17 0731) Weight:  [176 lb 5.9 oz (80 kg)] 176 lb 5.9 oz (80 kg) (01/17 0600)  CBC:  Recent Labs  Lab 11/29/17 0222 11/30/17 0438  WBC 5.6 4.5  NEUTROABS 4.0 2.9  HGB 12.3* 11.2*  HCT 37.2* 34.9*  MCV 72.0* 71.4*  PLT 160 818    Basic Metabolic Panel:  Recent Labs  Lab 11/27/17 0449  11/28/17 0444 11/28/17 1620 11/30/17 2993  NA 140  --  141  --  147*  K 3.7  --  3.7  --  3.6  CL 108  --  111  --  113*  CO2 22  --  21*  --  25  GLUCOSE 105*  --  125*  --  128*  BUN 18  --  21*  --  22*  CREATININE 2.37*  --  2.38*  --  1.78*  CALCIUM 8.8*  --  8.7*  --  8.7*  MG 2.0  --  2.1  --   --   PHOS 4.2   < > 3.2 2.7 3.2   < > = values in this interval not displayed.    Lipid Panel:     Component Value Date/Time   CHOL 322 (H) 11/26/2017 0735   CHOL 295 (H) 05/29/2017 1537   TRIG 63 11/29/2017 2028   HDL 57 11/26/2017 0735   HDL 60 05/29/2017 1537   CHOLHDL 5.6 11/26/2017 0735   VLDL 14 11/26/2017 0735   LDLCALC 251 (H) 11/26/2017 0735   LDLCALC 224 (H)  05/29/2017 1537   HgbA1c:  Lab Results  Component Value Date   HGBA1C 5.7 (H) 11/26/2017   Urine Drug Screen:     Component Value Date/Time   LABOPIA NONE DETECTED 11/26/2017 0047   COCAINSCRNUR NONE DETECTED 11/26/2017 0047   LABBENZ NONE DETECTED 11/26/2017 0047   AMPHETMU NONE DETECTED 11/26/2017 0047   THCU NONE DETECTED 11/26/2017 0047   LABBARB NONE DETECTED 11/26/2017 0047    Alcohol Level     Component Value Date/Time   ETH <11 06/07/2013 1731    IMAGING I have personally reviewed the radiological images below and agree with the radiology interpretations.  CTA NECK:  1. No hemodynamically significant stenosis or acute vascular process in the neck.  2. 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 No pseudoaneurysm.  3. An incidental finding of potential clinical significance has been found.  Ectatic 3.6 cm ascending aorta.  Recommend annual imaging followup by CTA or MRA. This recommendation follows 2010 ACCF/AHA/AATS/ACR/ASA/SCA/SCAI/SIR/STS/SVM Guidelines for the Diagnosis and Management of Patients with Thoracic Aortic Disease. Circulation.2010; 121: Y782-N562  CTA HEAD:  1. No emergent large vessel occlusion. Focally occluded RIGHT M2/M3 segment.  2. Advanced intracranial atherosclerosis with multifocal high-grade stenosis including severe stenosis LEFT M2 segment, moderate to severe stenoses RIGHT middle cerebral artery and LEFT posterior cerebral artery.  3. 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   CT PERFUSION:  1. No acute perfusion defect.  2. Prolonged mean transit time RIGHT MCA consistent with stenoses.   CT Head Wo Contrast 11/25/2017 IMPRESSION: 1. Potentially acute LEFT corona radiata/basal ganglia infarct. 2. ASPECTS is 9. 3. Old bilateral basal ganglia infarcts. 4. Moderate to severe white matter changes compatible chronic small vessel ischemic disease. 5. Mild global parenchymal brain  volume loss for age.  TTE - Left ventricle: The cavity size was normal. Systolic function was   normal. The estimated ejection fraction was in the range of 60%   to 65%. Wall motion was normal; there were no regional wall   motion abnormalities. - Aortic valve: There was mild regurgitation. - Atrial septum: No defect or patent foramen ovale was identified. - Pulmonary arteries: PA peak pressure: 32 mm Hg (S).  Mr Brain Wo Contrast 11/27/2017 IMPRESSION: 1. Small acute infarcts scattered in the left corona radiata, some tracking toward the posterior left lentiform. No associated acute hemorrhage or mass effect.  2. Underlying severe chronic ischemic disease in the bilateral deep gray matter nuclei with advanced chronic signal changes in the cerebral white matter, and brainstem Wallerian degeneration. 3. Intubated. Right nasoenteric tube in place. Fluid in the pharynx.    PHYSICAL EXAM  Temp:  [98.6 F (37 C)-100 F (37.8 C)] 99.1 F (37.3 C) (01/17 0814) Pulse Rate:  [77-122] 101 (01/17 0814) Resp:  [13-26] 15 (01/17 0814) BP: (141-222)/(97-153) 179/145 (01/17 0814) SpO2:  [94 %-100 %] 100 % (01/17 0731) FiO2 (%):  [40 %] 40 % (01/17 0731) Weight:  [176 lb 5.9 oz (80 kg)] 176 lb 5.9 oz (80 kg) (01/17 0600)  General - Well nourished, well developed, intubated off sedation.  Ophthalmologic - Fundi not visualized due to small pupils.  Cardiovascular - Regular rate and rhythm.  Neuro - intubated off sedation, lethargic, able to open eyes on voice, not following commands for eye open close, but able to follow limited commands with right hand and right foot. Bilateral small pupils, positive corneals bilaterally, weak gag and cough. On pain stimulation, unsustained spasm on the left UE and LE. RUE and RLE 2/5 with pain. DTR normal on the right, but significantly increased on the left, babinski positive on the left. Still has significant spasticity at LUE and LLE. Sensation, coordination and  gait not tested.   ASSESSMENT/PLAN [name] is a 57 y.o. 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 asarthria. He did not receive IV t-PA due to initial improvement in deficits and late presentation.  Stroke: suspected brainstem vs. b/l subcortical involvement - MRI to be performed when pt can lie flat. Stroke etiology likely due to atherosclerosis.  Resultant asarthria, severe dysphagia, intubated  CT head - Possible acute LEFT corona radiata/basal ganglia infarct.  MRI head - left BG and CR 2 small infarcts  CTA head and neck - right M1, M2, and left M2 stenosis  2D Echo - EF 60-65%  LDL - 251  HgbA1c - 5.7  UDS - negative   VTE prophylaxis - Lovenox Diet NPO time specified  aspirin 81 mg daily prior to admission, now on ASA 365m. Hold off plavix for now in case pt need trach or PEG procedure  Patient will be counseled to be compliant with his antithrombotic medications  Ongoing aggressive stroke risk factor management  Therapy recommendations:  pending  Disposition:  Pending  Respiratory distress  Due to pseudobulbar etiology  Intubated now  CCM on board  On zosyn and vanco   Discussed with wife, will do extubation trial today, if failed will re-intubate and consider trach  Asarthria and dysphagia   Pseudobulbar etiology with b/l involvement  On TF  May need PEG in the future if failed extubation  AKI, improving  Cre 1.30->1.97->2.37->2.38->1.78  Likely due to dehydration due to perfuse sweating and lack of po fluid  On IVF @ 60  On TF  Close monitoring  History of stroke  First stroke in 1999 as per wife  06/2004 CT head showed old right BG encephalomalacia  12/2006 left BG ICH, MRA h/n unremarkable  75/4627- left PLIC  and CR infarct   Hypertension BP high today, may related to agitation on vent Labetalol PRN Long-term BP goal normotensive  Hyperlipidemia  Home meds: lipitor 20.  LDL 251, goal < 70  Now on  Lipitor 80 mg daily  Continue statin at discharge  Other Stroke Risk Factors  Former cigarette smoker - quit  ETOH use, advised to drink no more than 1 drink per day.  Hx of substance abuse  Other Active Problems  Hypokalemia - resolved after supplement  History of polysubstance abuse - UDS negative   Hospital day # 4  This patient is critically ill due to respiratory distress, stroke, dysphagia, asarthria and at significant risk of neurological worsening, death form recurrent stroke, seizure, aspiration pneumonia, heart failure, renal failure. This patient's care requires constant monitoring of vital signs, hemodynamics, respiratory and cardiac monitoring, review of multiple databases, neurological assessment, discussion with family, other specialists and medical decision making of high complexity. I had discussion with wife at bedside, updated pt current condition, treatment plan and potential prognosis. She decided on extubation trial. I also discussed with Dr. Carson Myrtle. I spent 40 minutes of neurocritical care time in the care of this patient.   Rosalin Hawking, MD PhD Stroke Neurology 11/30/2017 10:42 AM   To contact Stroke Continuity provider, please refer to [email or web address] After hours, contact General Neurology

## 2017-11-30 NOTE — Progress Notes (Signed)
Order for extubation. Patient has no cuff leak and no volume loss with deflated cuff and on full support. MD made aware. Awaiting further orders.

## 2017-12-01 DIAGNOSIS — Z4659 Encounter for fitting and adjustment of other gastrointestinal appliance and device: Secondary | ICD-10-CM

## 2017-12-01 LAB — RENAL FUNCTION PANEL
Albumin: 2.7 g/dL — ABNORMAL LOW (ref 3.5–5.0)
Anion gap: 11 (ref 5–15)
BUN: 27 mg/dL — ABNORMAL HIGH (ref 6–20)
CO2: 25 mmol/L (ref 22–32)
CREATININE: 1.79 mg/dL — AB (ref 0.61–1.24)
Calcium: 8.9 mg/dL (ref 8.9–10.3)
Chloride: 114 mmol/L — ABNORMAL HIGH (ref 101–111)
GFR, EST AFRICAN AMERICAN: 47 mL/min — AB (ref >60)
GFR, EST NON AFRICAN AMERICAN: 41 mL/min — AB (ref >60)
Glucose, Bld: 113 mg/dL — ABNORMAL HIGH (ref 65–99)
PHOSPHORUS: 2.7 mg/dL (ref 2.5–4.6)
POTASSIUM: 3.9 mmol/L (ref 3.5–5.1)
Sodium: 150 mmol/L — ABNORMAL HIGH (ref 135–145)

## 2017-12-01 LAB — GLUCOSE, CAPILLARY
GLUCOSE-CAPILLARY: 118 mg/dL — AB (ref 65–99)
GLUCOSE-CAPILLARY: 97 mg/dL (ref 65–99)
GLUCOSE-CAPILLARY: 102 mg/dL — AB (ref 65–99)
GLUCOSE-CAPILLARY: 94 mg/dL (ref 65–99)
Glucose-Capillary: 102 mg/dL — ABNORMAL HIGH (ref 65–99)

## 2017-12-01 LAB — CBC WITH DIFFERENTIAL/PLATELET
BASOS ABS: 0 10*3/uL (ref 0.0–0.1)
Basophils Relative: 0 %
Eosinophils Absolute: 0.3 10*3/uL (ref 0.0–0.7)
Eosinophils Relative: 7 %
HEMATOCRIT: 36 % — AB (ref 39.0–52.0)
Hemoglobin: 11.3 g/dL — ABNORMAL LOW (ref 13.0–17.0)
LYMPHS PCT: 24 %
Lymphs Abs: 1.1 10*3/uL (ref 0.7–4.0)
MCH: 22.8 pg — ABNORMAL LOW (ref 26.0–34.0)
MCHC: 31.4 g/dL (ref 30.0–36.0)
MCV: 72.7 fL — AB (ref 78.0–100.0)
Monocytes Absolute: 0.4 10*3/uL (ref 0.1–1.0)
Monocytes Relative: 8 %
NEUTROS ABS: 2.8 10*3/uL (ref 1.7–7.7)
Neutrophils Relative %: 61 %
PLATELETS: 201 10*3/uL (ref 150–400)
RBC: 4.95 MIL/uL (ref 4.22–5.81)
RDW: 15.2 % (ref 11.5–15.5)
WBC: 4.6 10*3/uL (ref 4.0–10.5)

## 2017-12-01 MED ORDER — DEXAMETHASONE SODIUM PHOSPHATE 10 MG/ML IJ SOLN
20.0000 mg | Freq: Four times a day (QID) | INTRAMUSCULAR | Status: AC
  Administered 2017-12-01 – 2017-12-02 (×2): 20 mg via INTRAVENOUS
  Filled 2017-12-01: qty 2

## 2017-12-01 MED ORDER — RACEPINEPHRINE HCL 2.25 % IN NEBU
INHALATION_SOLUTION | RESPIRATORY_TRACT | Status: AC
  Administered 2017-12-01: 0.5 mL via RESPIRATORY_TRACT
  Filled 2017-12-01: qty 0.5

## 2017-12-01 MED ORDER — DEXAMETHASONE SODIUM PHOSPHATE 10 MG/ML IJ SOLN: 20.0000 mg | Freq: Once | INTRAMUSCULAR | Status: DC

## 2017-12-01 MED ORDER — LISINOPRIL 20 MG PO TABS: 20.0000 mg | ORAL_TABLET | Freq: Two times a day (BID) | ORAL | Status: DC

## 2017-12-01 MED ORDER — DEXAMETHASONE SODIUM PHOSPHATE 10 MG/ML IJ SOLN
INTRAMUSCULAR | Status: AC
  Administered 2017-12-01: 20 mg via INTRAVENOUS
  Filled 2017-12-01: qty 2

## 2017-12-01 MED ORDER — RACEPINEPHRINE HCL 2.25 % IN NEBU
0.5000 mL | INHALATION_SOLUTION | Freq: Once | RESPIRATORY_TRACT | Status: AC
  Administered 2017-12-01: 0.5 mL via RESPIRATORY_TRACT

## 2017-12-01 NOTE — Progress Notes (Signed)
Patient respiratory assessment rhonchi, stridor and snoring respirations. PRN albuterol treatment given and nasotracheal suction completed. CCMD made aware and at bedside. RN will continue to monitor.

## 2017-12-01 NOTE — Progress Notes (Signed)
Nutrition Follow-up  INTERVENTION:   If unable to swallow recommend Jevity 1.2 @ 65 ml/hr (1560 ml/day) 30 ml Prostat daily  Provides: 1972 kcal, 101 grams protein, and 1265 ml free water.   If able to pass swallow eval will supplement diet once advanced.    NUTRITION DIAGNOSIS:   Inadequate oral intake related to inability to eat as evidenced by NPO status. Ongoing.   GOAL:   Patient will meet greater than or equal to 90% of their needs Met.   MONITOR:   TF tolerance, I & O's, Vent status  ASSESSMENT:   Pt with PMH of HTN, CHF, hyperlipidemia, polysubstance abuse, CKD stage II, and multiple CVA. Pt admitted with stroke. Asarthria and dysphagia likely due to pseudobulbar etiology. Head CT showed possible acute left basal ganglia infarct. Emergently intubated 11/26/17 to protect airway due to difficulty managing secretions.  1/18 extubated  Per MD if fails extubation will need trach Remains NPO as pt was just extubated.   Medications reviewed and include: colace, senokot Labs reviewed: Na 150 (H)   Diet Order:  Diet NPO time specified  EDUCATION NEEDS:   Not appropriate for education at this time  Skin:  Skin Assessment: Reviewed RN Assessment  Last BM:  1/17  Height:   Ht Readings from Last 1 Encounters:  11/25/17 _0  (1.702 m)    Weight:   Wt Readings from Last 1 Encounters:  12/01/17 171 lb 11.8 oz (77.9 kg)    Ideal Body Weight:  67.3 kg  BMI:  Body mass index is 26.9 kg/m.  Estimated Nutritional Needs:   Kcal:  1900-2100  Protein:  95-105 g/day  Fluid:  2 L/day  Maylon Peppers RD, LDN, CNSC [phone] Pager [phone] After Hours Pager

## 2017-12-01 NOTE — Progress Notes (Signed)
CCMD made aware of patient respiratory status. Orders received to nasotracheal suction patient. RN will continue to monitor.

## 2017-12-01 NOTE — Procedures (Signed)
Extubation Procedure Note  Patient Details:   Name: [name] DOB: 10-29-1961 MRN: [id number]   Airway Documentation:  Airway 7.5 mm (Active)  Secured at (cm) 25 cm 12/01/2017  7:29 AM  Measured From Lips 12/01/2017  7:29 AM  Secured Location Center 12/01/2017  7:29 AM  Secured By [employer] 12/01/2017  7:29 AM  Tube Holder Repositioned Yes 12/01/2017  7:29 AM  Cuff Pressure (cm H2O) 28 cm H2O 12/01/2017  7:29 AM  Site Condition Dry 12/01/2017  7:29 AM   Positive cuff leak prior to extubation.  Evaluation  O2 sats: stable throughout Complications: No apparent complications Patient did tolerate procedure well. Bilateral Breath Sounds: Rhonchi, Diminished   No   RN present during extubation. No distress noted and VS stable throughout. Extubated to 4L [place].    [name] 12/01/2017, 11:12 AM

## 2017-12-01 NOTE — Progress Notes (Signed)
OT Cancellation Note  Patient Details Name: [name] MRN: [id number] DOB: [birth year]   Cancelled Treatment:    Reason Eval/Treat Not Completed: Patient not medically ready. Pt just extubated, RN requesting hold at this time until improvements in respiratory status. Will follow up for OT eval as pt is medically appropriate and time allows.  Binnie Kand M.S., OTR/L Pager: [phone]  12/01/2017, 12:00 PM

## 2017-12-01 NOTE — Progress Notes (Addendum)
[facility] Pulmonary Diseases & Critical Care Medicine Progress Note  Patient Name: [name] MRN: [id number] DOB: 1961-09-02    ADMISSION DATE:  11/25/2017 CONSULTATION DATE:  11/26/2017  REFERRING MD:  Dr. Vernell Blackburn  REASON FOR CONSULTATION:  Stroke, airway compromise   ASSESSMENT/PLAN:  ASSESSMENT  Principal Problem:   Stroke [facility]) Active Problems:   Aphasia   Dysarthria   Oropharyngeal dysphagia   Bulbar palsy (HCC)   Aspiration pneumonia (HCC)   Endotracheally intubated   Essential hypertension, benign   Acute kidney injury ([place])   Atelectasis   History of completed stroke   Microcytic anemia   PLAN/RECOMMENDATIONS   Extubate today.  Stop Zosyn/vancomycin for aspiration pneumonia (GPC/pairs, chains, Gram negative coccobacilli, GPR) after 12/01/2017 doses.  Renal dosing of medications   NUTRITION: hold TF for extubation   DVT PROPHYLAXIS: Lovenox  GI PROPHYLAXIS: famotidine    SUBJECTIVE:   -Interim events: Remains intubated and sedated with propofol. On sedation vacation. Did not get extubated yesterday for failure to demonstrate air leak. Today, has a definite air leak. Follows commands. More brisk with LUE today. On PSV 5/5.   HISTORY OF PRESENT ILLNESS This 57 year old male was originally seen in consultation by Dr. Vernie Murders on 11/26/2017 for stroke. The patient was admitted on 11/25/2017 with acute speech difficulty and was diagnosed with an acute L basal ganglia infarct. 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 Neurology was consulted and was concerned for possible brainstem vs b/l subcortical infarct.  The patient has an extensive medical history, including cocaine abuse and prior ICH (R basal ganglia hemorrhage 1999, L basal ganglia hemorrhage 2005).  REVIEW OF SYSTEMS: unable to obtain due to intubated status.   PAST MEDICAL/SURGICAL/SOCIAL/FAMILY HISTORY  Past Medical History:  Diagnosis Date  . Arthritis   .  Chest pain    with a negative adenosine cardiolite in December 2006. Dr. Liana Blackburn   . Cocaine abuse ([place])    last used in 2000; currently in remission; no IVDU to my knowledge  . Diastolic dysfunction    impaired L ventricle relatation with an EF of 65% and echo in December 2005  . Erectile dysfunction   . Hyperlipidemia    elevated transaminases on statin?-40's (2/06) - Hep B/C negative 4/06; resolution on 4/06 labs; off statin in 2006  . Hypertension    concentric LVH 2d echo 12/05; negative proteinuria 11/00  . 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 Onychomycosis April 2003   right big toe  . Prostate disorder    prostate irregularity with obstructive voiding symptoms (PSA 0.67); -likely related to overstimulation of alpha receptors at bladder neck; seen by Dr. Reece Blackburn in 9/05. no BPH at that visit  . Tinea versicolor    dermatology referral; secondary to no treatement response to selenium sulfide; currently resolive    Past Surgical History:  Procedure Laterality Date  . COLONOSCOPY WITH PROPOFOL N/A 05/22/2013   Procedure: COLONOSCOPY WITH PROPOFOL;  Surgeon: Arta Silence, MD;  Location: [facility] ENDOSCOPY;  Service: Endoscopy;  Laterality: N/A;    Social History   Tobacco Use  . Smoking status: Never Smoker  . Smokeless tobacco: Never Used  Substance Use Topics  . Alcohol use: Yes  Alcohol/week: 0.0 oz    Comment: Beer rarely.    Family History  Problem Relation Age of Onset  . Alcohol abuse Father   . Liver cancer Father   . Diabetes Father     Prior to Admission medications   Medication Sig Start Date End Date Taking? Authorizing Provider  amLODipine (NORVASC) 10 MG  tablet Take 1 tablet (10 mg total) by mouth every morning. 05/29/17  Yes Tawny Asal, MD  aspirin 81 MG tablet Take 1 tablet (81 mg total) by mouth daily. 10/04/16  Yes Blackburn, Joshua [place], MD  hydrochlorothiazide (HYDRODIURIL) 25 MG tablet Take 1 tablet (25 mg total) by mouth every morning. 05/29/17  Yes Tawny Asal, MD  lisinopril (PRINIVIL,ZESTRIL) 20 MG tablet Take 1 tablet (20 mg total) by mouth daily. 05/29/17  Yes Tawny Asal, MD  Multiple Vitamins-Minerals (HM MULTIVITAMIN ADULT GUMMY PO) Take 1 tablet by mouth every morning.   Yes [provider]  atorvastatin (LIPITOR) 20 MG tablet Take 1 tablet (20 mg total) by mouth daily. Patient not taking: Reported on 11/25/2017 05/29/17   Tawny Asal, MD     No Known Allergies    Current Facility-Administered Medications:  .  0.9 %  sodium chloride infusion, 250 mL, Intravenous, PRN, Blackburn, Joshua Blitz, MD, Stopped at 11/27/17 1235 .  0.9 %  sodium chloride infusion, , Intravenous, Continuous, Renee Pain, MD, Last Rate: 60 mL/hr at 12/01/17 1100 .  acetaminophen (TYLENOL) tablet 650 mg, 650 mg, Oral, Q4H PRN **OR** acetaminophen (TYLENOL) solution 650 mg, 650 mg, Per Tube, Q4H PRN **OR** acetaminophen (TYLENOL) suppository 650 mg, 650 mg, Rectal, Q4H PRN, Opyd, Joshua S, MD .  albuterol (PROVENTIL) (2.5 MG/3ML) 0.083% nebulizer solution 2.5 mg, 2.5 mg, Nebulization, Q2H PRN, Blackburn, Joshua Blitz, MD .  amLODipine (NORVASC) tablet 10 mg, 10 mg, Oral, q morning - 10a, Rosalin Hawking, MD, 10 mg at 12/01/17 0935 .  aspirin tablet 325 mg, 325 mg, Oral, Daily, Blackburn, Joshua Blitz, MD, 325 mg at 12/01/17 0935 .  atorvastatin (LIPITOR) tablet 80 mg, 80 mg, Oral, q1800, Opyd, Joshua Qua, MD, 80 mg at 11/30/17 1803 .  chlorhexidine gluconate (MEDLINE KIT) (PERIDEX) 0.12 % solution 15 mL, 15 mL, Mouth Rinse, BID, Blackburn, Joshua Blitz, MD, 15 mL at 12/01/17 0751 .  docusate (COLACE) 50 MG/5ML liquid 100 mg, 100 mg, Per Tube, BID, Blackburn,  Joshua Blitz, MD, 100 mg at 12/01/17 0936 .  famotidine (PEPCID) IVPB 20 mg premix, 20 mg, Intravenous, Q24H, [name], RPH, Stopped at 11/30/17 2135 .  feeding supplement (VITAL HIGH PROTEIN) liquid 1,000 mL, 1,000 mL, Per Tube, Continuous, Renee Pain, MD, Stopped at 12/01/17 1050 .  fentaNYL (SUBLIMAZE) injection 100 mcg, 100 mcg, Intravenous, Q2H PRN, Blackburn, Joshua Blitz, MD, 100 mcg at 11/30/17 1752 .  labetalol (NORMODYNE,TRANDATE) injection 5 mg, 5 mg, Intravenous, Q2H PRN, Opyd, Joshua Qua, MD, 5 mg at 12/01/17 0809 .  lidocaine (PF) (XYLOCAINE) 1 % injection 30 mL, 30 mL, Other, Once, Renee Pain, MD .  lisinopril (PRINIVIL,ZESTRIL) tablet 20 mg, 20 mg, Oral, Daily, Rosalin Hawking, MD, 20 mg at 12/01/17 0936 .  MEDLINE mouth rinse, 15 mL, Mouth Rinse, 10 times per day, Blackburn, Joshua Blitz, MD, 15 mL at 12/01/17 0936 .  midazolam (VERSED) injection 2 mg, 2 mg, Intravenous, Q2H PRN, Blackburn, Joshua Blitz, MD, 2 mg at 11/30/17 1148 .  ondansetron (ZOFRAN) injection 4 mg, 4 mg, Intravenous, Q6H PRN, Blackburn, Joshua Blitz, MD .  piperacillin-tazobactam (ZOSYN) IVPB 3.375  g, 3.375 g, Intravenous, Q8H, Renee Pain, MD, Stopped at 12/01/17 1022 .  propofol (DIPRIVAN) 1000 MG/100ML infusion, 0-50 mcg/kg/min, Intravenous, Continuous, Blackburn, Joshua Blitz, MD, Stopped at 11/29/17 [phone] .  sennosides (SENOKOT) 8.8 MG/5ML syrup 5 mL, 5 mL, Oral, BID, Blackburn, Joshua Blitz, MD, 5 mL at 12/01/17 0935   OBJECTIVE:   VITAL SIGNS: BP (!) 167/118   Pulse 96   Temp 98.9 F (37.2 C) (Axillary)   Resp (!) 24   Ht _0  (1.702 m)   Wt 77.9 kg (171 lb 11.8 oz)   SpO2 100%   BMI 26.90 kg/m  Vitals:   12/01/17 0800 12/01/17 0900 12/01/17 1000 12/01/17 1100  BP: (!) 182/122 (!) 163/103 (!) 167/118   Pulse: (!) 113 92 99 96  Resp: _1 (!) 24  Temp: 98.9 F (37.2 C)     TempSrc: Axillary     SpO2: 98% 99% 97% 100%  Weight:      Height:        HEMODYNAMICS:    VENTILATOR  SETTINGS: Vent Mode: PSV;CPAP FiO2 (%):  [30 %-40 %] 30 % Set Rate:  [12 bmp] 12 bmp Vt Set:  [530 mL] 530 mL PEEP:  [5 cmH20] 5 cmH20 Pressure Support:  [5 cmH20] 5 cmH20 Plateau Pressure:  [13 cmH20-19 cmH20] 19 cmH20  INTAKE / OUTPUT: I/O last 3 completed shifts: In: 1191 [I.V.:2100; NG/GT:1575; IV Piggyback:550] Out: 2650 [Urine:2650]  PHYSICAL EXAMINATION: General: awake. intubated and sedated Neuro: follows commands sluggishly. Strong cough. Improved gag. Dense paresis of L UE. B Babinski. DTR 3+ @ R UE, 1+ @ LUE, 2+ @ R LE, 2+ @ L LE. HEENT: ETT in situ. Copious oral secretions. Cardiovascular: Regular S1 and S2 without murmur, rub or gallop Lungs: Good air entry. Coarse breath sounds bilaterally. Rales and rhonchi. Abdomen:  Soft NTND, BS+ Musculoskeletal: no LE edema GU: condom catheter in place  Skin: no rashes    LABS:  BMET Recent Labs  Lab 11/28/17 0444 11/30/17 0438 12/01/17 0837  NA 141 147* 150*  K 3.7 3.6 3.9  CL 111 113* 114*  CO2 21* 25 25  BUN 21* 22* 27*  CREATININE 2.38* 1.78* 1.79*  GLUCOSE 125* 128* 113*    Electrolytes Recent Labs  Lab 11/26/17 0735  11/27/17 0449  11/28/17 0444 11/28/17 1620 11/30/17 0438 12/01/17 0837  CALCIUM 9.3   < > 8.8*  --  8.7*  --  8.7* 8.9  MG 1.9  --  2.0  --  2.1  --   --   --   PHOS  --   --  4.2   < > 3.2 2.7 3.2 2.7   < > = values in this interval not displayed.    CBC Recent Labs  Lab 11/29/17 0222 11/30/17 0438 12/01/17 0837  WBC 5.6 4.5 4.6  HGB 12.3* 11.2* 11.3*  HCT 37.2* 34.9* 36.0*  PLT 160 189 201    Coag's Recent Labs  Lab 11/25/17 2246  APTT 30  INR 0.98    Sepsis Markers No results for input(s): LATICACIDVEN, PROCALCITON, O2SATVEN in the last 168 hours.  ABG Recent Labs  Lab 11/28/17 0405 11/29/17 0340 11/30/17 0340  PHART 7.368 7.400 7.418  PCO2ART 41.1 42.4 44.0  PO2ART 143* 141* 129*    Liver Enzymes Recent Labs  Lab 11/25/17 2246 11/28/17 0444  11/30/17 0438 12/01/17 0837  AST 27 44*  --   --   ALT 21 23  --   --  ALKPHOS 92 74  --   --   BILITOT 1.0 1.4*  --   --   ALBUMIN 3.9 2.7* 2.6* 2.7*    Cardiac Enzymes No results for input(s): TROPONINI, PROBNP in the last 168 hours.  Glucose Recent Labs  Lab 11/30/17 1118 11/30/17 1547 11/30/17 1912 11/30/17 2326 12/01/17 0308 12/01/17 0741  GLUCAP 126* 118* 122* 98 118* 97    Imaging No results found.  CULTURES: Results for orders placed or performed during the hospital encounter of 11/25/17  Culture, respiratory (tracheal aspirate)     Status: None   Collection Time: 11/27/17 12:12 AM  Result Value Ref Range Status   Specimen Description TRACHEAL ASPIRATE  Final   Special Requests NONE  Final   Gram Stain   Final    FEW WBC PRESENT, PREDOMINANTLY PMN MODERATE GRAM POSITIVE COCCI IN PAIRS IN CHAINS FEW GRAM NEGATIVE COCCOBACILLI RARE GRAM POSITIVE RODS    Culture Consistent with normal respiratory flora.  Final   Report Status 11/29/2017 FINAL  Final  MRSA PCR Screening     Status: Abnormal   Collection Time: 11/27/17 12:20 AM  Result Value Ref Range Status   MRSA by PCR INVALID RESULTS, SPECIMEN SENT FOR CULTURE (A) NEGATIVE Final    Comment: CALLED TO LILLEY,T RN [phone] 11/27/17 MITCHELL,L        The GeneXpert MRSA Assay (FDA approved for NASAL specimens only), is one component of a comprehensive MRSA colonization surveillance program. It is not intended to diagnose MRSA infection nor to guide or monitor treatment for MRSA infections.   MRSA culture     Status: None   Collection Time: 11/27/17 12:20 AM  Result Value Ref Range Status   Specimen Description NASAL SWAB  Final   Special Requests NONE  Final   Culture NO MRSA DETECTED  Final   Report Status 11/28/2017 FINAL  Final    OTHER STUDIES:  -  ANTIBIOTICS: Zosyn 1/14-1/18 Vancomycin 1/14-1/18  SIGNIFICANT EVENTS: 1/12 >> Admitted 1/13 >> transfer to ICU. Intubated for airway  protection   My assessment, plan of care, findings, medications, side effects, etc. were discussed with:  Nurse  Family (wife)   Renee Pain, MD Board Certified by the ABIM, [place] Pager: [phone]  12/01/2017, 11:01 AM

## 2017-12-01 NOTE — Progress Notes (Signed)
STROKE TEAM PROGRESS NOTE   SUBJECTIVE (INTERVAL HISTORY) Wife is at bedside. Pt still intubated, more awake alert and following commands but still lethargic. Not able to do extubation trial yesterday due to no cuff leak. Discussed with Dr. Carson Myrtle, he may try it today but if failed, will do trach next week.     Past Medical History:  Diagnosis Date  . Arthritis   . Chest pain    with a negative adenosine cardiolite in December 2006. Dr. Liana Crocker   . Cocaine abuse ([place])    last used in 2000; currently in remission; no IVDU to my knowledge  . Diastolic dysfunction    impaired L ventricle relatation with an EF of 65% and echo in December 2005  . Erectile dysfunction   . Hyperlipidemia    elevated transaminases on statin?-40's (2/06) - Hep B/C negative 4/06; resolution on 4/06 labs; off statin in 2006  . Hypertension    concentric LVH 2d echo 12/05; negative proteinuria 11/00  . 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 Onychomycosis April 2003   right big toe  . Prostate disorder    prostate irregularity with obstructive voiding symptoms (PSA 0.67); -likely related to overstimulation of alpha receptors at bladder neck; seen by Dr. Reece Agar in 9/05. no BPH at that visit  . Tinea versicolor    dermatology referral; secondary to no treatement response to selenium sulfide; currently resolive    HOME MEDICATIONS:  Current Meds  Medication Sig  . amLODipine (NORVASC) 10 MG tablet Take 1 tablet (10 mg total) by mouth every morning.  Marland Kitchen aspirin 81 MG tablet Take 1 tablet (81 mg total) by mouth daily.  . hydrochlorothiazide (HYDRODIURIL) 25 MG tablet Take 1 tablet (25 mg total) by mouth every morning.   Marland Kitchen lisinopril (PRINIVIL,ZESTRIL) 20 MG tablet Take 1 tablet (20 mg total) by mouth daily.  . Multiple Vitamins-Minerals (HM MULTIVITAMIN ADULT GUMMY PO) Take 1 tablet by mouth every morning.     HOSPITAL MEDICATIONS:  . amLODipine  10 mg Oral q morning - 10a  . aspirin  325 mg Oral Daily  . atorvastatin  80 mg Oral q1800  . chlorhexidine gluconate (MEDLINE KIT)  15 mL Mouth Rinse BID  . docusate  100 mg Per Tube BID  . lidocaine (PF)  30 mL Other Once  . lisinopril  20 mg Oral Daily  . mouth rinse  15 mL Mouth Rinse 10 times per day  . sennosides  5 mL Oral BID    OBJECTIVE Temp:  [98.9 F (37.2 C)-99.1 F (37.3 C)] 98.9 F (37.2 C) (01/18 0400) Pulse Rate:  [71-104] 104 (01/18 0729) Cardiac Rhythm: Normal sinus rhythm (01/17 2000) Resp:  [12-24] 19 (01/18 0729) BP: (124-179)/(89-145) 150/110 (01/18 0729) SpO2:  [99 %-100 %] 99 % (01/18 0729) FiO2 (%):  [30 %-40 %] 30 % (01/18 0729) Weight:  [171 lb 11.8 oz (77.9 kg)] 171 lb 11.8 oz (77.9 kg) (01/18 0500)  CBC:  Recent Labs  Lab 11/29/17 0222 11/30/17 0438  WBC 5.6 4.5  NEUTROABS 4.0 2.9  HGB 12.3* 11.2*  HCT 37.2* 34.9*  MCV 72.0* 71.4*  PLT 160 010    Basic Metabolic Panel:  Recent Labs  Lab 11/27/17 0449  11/28/17 0444 11/28/17 1620 11/30/17 0438  NA 140  --  141  --  147*  K 3.7  --  3.7  --  3.6  CL 108  --  111  --  113*  CO2 22  --  21*  --  25  GLUCOSE 105*  --  125*  --  128*  BUN 18  --  21*  --  22*  CREATININE 2.37*  --  2.38*  --  1.78*  CALCIUM 8.8*  --  8.7*  --  8.7*  MG 2.0  --  2.1  --   --   PHOS 4.2   < > 3.2 2.7 3.2   < > = values in this interval not displayed.    Lipid Panel:     Component Value Date/Time   CHOL 322 (H) 11/26/2017 0735   CHOL 295 (H) 05/29/2017 1537   TRIG 63 11/29/2017 2028   HDL 57 11/26/2017 0735   HDL 60 05/29/2017 1537   CHOLHDL 5.6 11/26/2017 0735   VLDL 14 11/26/2017 0735   LDLCALC 251 (H) 11/26/2017 0735   LDLCALC 224 (H) 05/29/2017 1537    HgbA1c:  Lab Results  Component Value Date   HGBA1C 5.7 (H) 11/26/2017   Urine Drug Screen:     Component Value Date/Time   LABOPIA NONE DETECTED 11/26/2017 0047   COCAINSCRNUR NONE DETECTED 11/26/2017 0047   LABBENZ NONE DETECTED 11/26/2017 0047   AMPHETMU NONE DETECTED 11/26/2017 0047   THCU NONE DETECTED 11/26/2017 0047   LABBARB NONE DETECTED 11/26/2017 0047    Alcohol Level     Component Value Date/Time   ETH <11 06/07/2013 1731    IMAGING I have personally reviewed the radiological images below and agree with the radiology interpretations.  CTA NECK:  1. No hemodynamically significant stenosis or acute vascular process in the neck.  2. 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 No pseudoaneurysm.  3. An incidental finding of potential clinical significance has been found.  Ectatic 3.6 cm ascending aorta.  Recommend annual imaging followup by CTA or MRA. This recommendation follows 2010 ACCF/AHA/AATS/ACR/ASA/SCA/SCAI/SIR/STS/SVM Guidelines for the Diagnosis and Management of Patients with Thoracic Aortic Disease. Circulation.2010; 121: S239-R320  CTA HEAD:  1. No emergent large vessel occlusion. Focally occluded RIGHT M2/M3 segment.  2. Advanced intracranial atherosclerosis with multifocal high-grade stenosis including severe stenosis LEFT M2 segment, moderate to severe stenoses RIGHT middle cerebral artery and LEFT posterior cerebral artery.  3. 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   CT PERFUSION:  1. No acute perfusion defect.  2. Prolonged mean transit time RIGHT MCA consistent with stenoses.   CT Head Wo Contrast 11/25/2017 IMPRESSION: 1. Potentially acute LEFT corona radiata/basal ganglia infarct. 2. ASPECTS is 9. 3. Old bilateral basal ganglia infarcts. 4. Moderate to severe white matter changes compatible chronic small vessel ischemic disease. 5. Mild global parenchymal brain volume loss for  age.  TTE - Left ventricle: The cavity size was normal. Systolic function was   normal. The estimated ejection fraction was in the range of 60%   to 65%. Wall motion was normal; there were no regional wall   motion abnormalities. - Aortic valve: There was mild regurgitation. - Atrial septum: No defect or patent foramen ovale was identified. - Pulmonary arteries: PA peak pressure: 32 mm Hg (S).  Mr Brain Wo Contrast 11/27/2017 IMPRESSION: 1. Small acute infarcts scattered in the left corona radiata, some tracking toward the posterior left lentiform.  No associated acute hemorrhage or mass effect. 2. Underlying severe chronic ischemic disease in the bilateral deep gray matter nuclei with advanced chronic signal changes in the cerebral white matter, and brainstem Wallerian degeneration. 3. Intubated. Right nasoenteric tube in place. Fluid in the pharynx.    PHYSICAL EXAM  Temp:  [98.9 F (37.2 C)-99.1 F (37.3 C)] 98.9 F (37.2 C) (01/18 0400) Pulse Rate:  [71-104] 104 (01/18 0729) Resp:  [12-24] 19 (01/18 0729) BP: (124-179)/(89-145) 150/110 (01/18 0729) SpO2:  [99 %-100 %] 99 % (01/18 0729) FiO2 (%):  [30 %-40 %] 30 % (01/18 0729) Weight:  [171 lb 11.8 oz (77.9 kg)] 171 lb 11.8 oz (77.9 kg) (01/18 0500)  General - Well nourished, well developed, intubated off sedation.  Ophthalmologic - Fundi not visualized due to small pupils.  Cardiovascular - Regular rate and rhythm.  Neuro - intubated off sedation, lethargic, easily open eyes on voice, able to follow simple commands with right hand and right foot. Bilateral small pupils, positive corneals bilaterally, positive gag and cough. On pain stimulation, unsustained spasm on the left UE and LE. RUE and RLE 2/5 with pain. DTR normal on the right, but significantly increased on the left, babinski positive on the left. Still has significant spasticity at LUE and LLE. Sensation, coordination and gait not tested.   ASSESSMENT/PLAN Mr.  SADIQ MCCAULEY is a 58 y.o. 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 asarthria. He did not receive IV t-PA due to initial improvement in deficits and late presentation.  Stroke: suspected brainstem vs. b/l subcortical involvement - MRI to be performed when pt can lie flat. Stroke etiology likely due to atherosclerosis.  Resultant asarthria, severe dysphagia, intubated  CT head - Possible acute LEFT corona radiata/basal ganglia infarct.  MRI head - left BG and CR 2 small infarcts  CTA head and neck - right M1, M2, and left M2 stenosis  2D Echo - EF 60-65%  LDL - 251  HgbA1c - 5.7  UDS - negative   VTE prophylaxis - Lovenox Diet NPO time specified  aspirin 81 mg daily prior to admission, now on ASA 364m. Hold off plavix for possible trach and PEG procedure  Patient will be counseled to be compliant with his antithrombotic medications  Ongoing aggressive stroke risk factor management  Therapy recommendations:  pending  Disposition:  Pending  Respiratory distress  Due to pseudobulbar etiology  Intubated now  CCM on board  Off zosyn and vanco   Discussed with wife and Dr. JCarson Myrtle will do extubation trial today, if failed will re-intubate and consider trach next week  Asarthria and dysphagia   Pseudobulbar etiology with b/l involvement  On TF  May need PEG in the future if failed extubation  AKI, improving  Cre 1.30->1.97->2.37->2.38->1.78->1.79  Likely due to dehydration due to perfuse sweating and lack of po fluid  On IVF @ 60  On TF @45   Close monitoring  History of stroke  First stroke in 1999 as per wife  06/2004 CT head showed old right BG encephalomalacia  12/2006 left BG ICH, MRA h/n unremarkable  70/0923- left PLIC and CR infarct    Hypertension BP high today, may related to agitation on vent On norvasc and lisinopril Increase lisinopril to 231mbid Labetalol PRN Long-term BP goal normotensive  Hyperlipidemia  Home meds: lipitor  20.  LDL 251, goal < 70  Now on Lipitor 80 mg daily  Continue statin at discharge  Other Stroke Risk Factors  Former cigarette smoker - quit  ETOH use, advised to drink no more than 1 drink per day.  Hx of substance abuse  Other Active Problems  Hypokalemia - resolved after supplement  History of cocaine abuse - UDS negative   Hospital day # 5  This patient is critically ill due to respiratory distress, stroke, dysphagia, asarthria and at significant risk of neurological worsening, death form recurrent stroke, seizure, aspiration pneumonia, heart failure, renal failure. This patient's care requires constant monitoring of vital signs, hemodynamics, respiratory and cardiac monitoring, review of multiple databases, neurological assessment, discussion with family, other specialists and medical decision making of high complexity. I had discussion with wife at bedside, updated pt current condition, treatment plan and potential prognosis. She decided on extubation trial. I also discussed with Dr. Carson Myrtle. I spent 35 minutes of neurocritical care time in the care of this patient.  Rosalin Hawking, MD PhD Stroke Neurology 12/01/2017 12:55 PM   To contact Stroke Continuity provider, please refer to [email or web address] After hours, contact General Neurology

## 2017-12-02 ENCOUNTER — Inpatient Hospital Stay (HOSPITAL_COMMUNITY): Payer: Medicare HMO

## 2017-12-02 DIAGNOSIS — J9589 Other postprocedural complications and disorders of respiratory system, not elsewhere classified: Secondary | ICD-10-CM

## 2017-12-02 LAB — CBC WITH DIFFERENTIAL/PLATELET
Basophils Absolute: 0 10*3/uL (ref 0.0–0.1)
Basophils Relative: 0 %
EOS PCT: 0 %
Eosinophils Absolute: 0 10*3/uL (ref 0.0–0.7)
HEMATOCRIT: 36.1 % — AB (ref 39.0–52.0)
Hemoglobin: 11.6 g/dL — ABNORMAL LOW (ref 13.0–17.0)
LYMPHS ABS: 0.5 10*3/uL — AB (ref 0.7–4.0)
Lymphocytes Relative: 9 %
MCH: 23.2 pg — AB (ref 26.0–34.0)
MCHC: 32.1 g/dL (ref 30.0–36.0)
MCV: 72.3 fL — ABNORMAL LOW (ref 78.0–100.0)
MONO ABS: 0.1 10*3/uL (ref 0.1–1.0)
MONOS PCT: 1 %
Neutro Abs: 5 10*3/uL (ref 1.7–7.7)
Neutrophils Relative %: 90 %
PLATELETS: 205 10*3/uL (ref 150–400)
RBC: 4.99 MIL/uL (ref 4.22–5.81)
RDW: 14.8 % (ref 11.5–15.5)
WBC: 5.6 10*3/uL (ref 4.0–10.5)

## 2017-12-02 LAB — RENAL FUNCTION PANEL
Albumin: 2.8 g/dL — ABNORMAL LOW (ref 3.5–5.0)
Anion gap: 13 (ref 5–15)
BUN: 29 mg/dL — AB (ref 6–20)
CALCIUM: 8.9 mg/dL (ref 8.9–10.3)
CO2: 23 mmol/L (ref 22–32)
Chloride: 113 mmol/L — ABNORMAL HIGH (ref 101–111)
Creatinine, Ser: 2.05 mg/dL — ABNORMAL HIGH (ref 0.61–1.24)
GFR calc non Af Amer: 35 mL/min — ABNORMAL LOW (ref >60)
GFR, EST AFRICAN AMERICAN: 40 mL/min — AB (ref >60)
Glucose, Bld: 120 mg/dL — ABNORMAL HIGH (ref 65–99)
PHOSPHORUS: 3.8 mg/dL (ref 2.5–4.6)
POTASSIUM: 4.1 mmol/L (ref 3.5–5.1)
Sodium: 149 mmol/L — ABNORMAL HIGH (ref 135–145)

## 2017-12-02 LAB — BLOOD GAS, ARTERIAL
ACID-BASE EXCESS: 2.8 mmol/L — AB (ref 0.0–2.0)
BICARBONATE: 26.6 mmol/L (ref 20.0–28.0)
Drawn by: 51133
FIO2: 40
LHR: 18 {breaths}/min
O2 Saturation: 97.6 %
PEEP/CPAP: 5 cmH2O
PO2 ART: 103 mmHg (ref 83.0–108.0)
Patient temperature: 98.6
VT: 530 mL
pCO2 arterial: 39.3 mmHg (ref 32.0–48.0)
pH, Arterial: 7.445 (ref 7.350–7.450)

## 2017-12-02 LAB — POCT I-STAT 3, ART BLOOD GAS (G3+)
ACID-BASE EXCESS: 4 mmol/L — AB (ref 0.0–2.0)
BICARBONATE: 31.1 mmol/L — AB (ref 20.0–28.0)
O2 Saturation: 100 %
PH ART: 7.334 — AB (ref 7.350–7.450)
TCO2: 33 mmol/L — ABNORMAL HIGH (ref 22–32)
pCO2 arterial: 58.4 mmHg — ABNORMAL HIGH (ref 32.0–48.0)
pO2, Arterial: 453 mmHg — ABNORMAL HIGH (ref 83.0–108.0)

## 2017-12-02 LAB — GLUCOSE, CAPILLARY
GLUCOSE-CAPILLARY: 107 mg/dL — AB (ref 65–99)
GLUCOSE-CAPILLARY: 135 mg/dL — AB (ref 65–99)
Glucose-Capillary: 110 mg/dL — ABNORMAL HIGH (ref 65–99)
Glucose-Capillary: 182 mg/dL — ABNORMAL HIGH (ref 65–99)
Glucose-Capillary: 180 mg/dL — ABNORMAL HIGH (ref 65–99)
Glucose-Capillary: 145 mg/dL — ABNORMAL HIGH (ref 65–99)
Glucose-Capillary: 116 mg/dL — ABNORMAL HIGH (ref 65–99)

## 2017-12-02 LAB — TRIGLYCERIDES: TRIGLYCERIDES: 55 mg/dL (ref <150)

## 2017-12-02 MED ORDER — ATORVASTATIN CALCIUM 80 MG PO TABS
80.0000 mg | ORAL_TABLET | Freq: Every day | ORAL | Status: DC
  Administered 2017-12-02 – 2017-12-21 (×19): 80 mg
  Filled 2017-12-02 (×20): qty 1

## 2017-12-02 MED ORDER — VITAL HIGH PROTEIN PO LIQD
1000.0000 mL | ORAL | Status: DC
  Administered 2017-12-02: 1000 mL

## 2017-12-02 MED ORDER — ASPIRIN 325 MG PO TABS
325.0000 mg | ORAL_TABLET | Freq: Every day | ORAL | Status: DC
  Administered 2017-12-03 – 2017-12-22 (×20): 325 mg
  Filled 2017-12-02 (×20): qty 1

## 2017-12-02 MED ORDER — ROCURONIUM BROMIDE 50 MG/5ML IV SOLN
50.0000 mg | Freq: Once | INTRAVENOUS | Status: AC
  Administered 2017-12-02: 50 mg via INTRAVENOUS
  Filled 2017-12-02: qty 5

## 2017-12-02 MED ORDER — AMLODIPINE BESYLATE 10 MG PO TABS
10.0000 mg | ORAL_TABLET | Freq: Every morning | ORAL | Status: DC
  Administered 2017-12-03 – 2017-12-22 (×20): 10 mg
  Filled 2017-12-02 (×20): qty 1

## 2017-12-02 MED ORDER — MIDAZOLAM HCL 2 MG/2ML IJ SOLN
INTRAMUSCULAR | Status: AC
  Administered 2017-12-02: 2 mg via INTRAVENOUS
  Filled 2017-12-02: qty 2

## 2017-12-02 MED ORDER — MIDAZOLAM HCL 2 MG/2ML IJ SOLN
2.0000 mg | Freq: Once | INTRAMUSCULAR | Status: AC
  Administered 2017-12-02: 2 mg via INTRAVENOUS

## 2017-12-02 MED ORDER — LISINOPRIL 20 MG PO TABS
20.0000 mg | ORAL_TABLET | Freq: Two times a day (BID) | ORAL | Status: DC
  Administered 2017-12-02 – 2017-12-22 (×39): 20 mg
  Filled 2017-12-02 (×40): qty 1

## 2017-12-02 MED ORDER — VITAL AF 1.2 CAL PO LIQD
1000.0000 mL | ORAL | Status: DC
  Administered 2017-12-02 – 2017-12-08 (×7): 1000 mL

## 2017-12-02 MED ORDER — SENNOSIDES 8.8 MG/5ML PO SYRP
5.0000 mL | ORAL_SOLUTION | Freq: Two times a day (BID) | ORAL | Status: DC
  Administered 2017-12-02 – 2017-12-22 (×37): 5 mL
  Filled 2017-12-02 (×39): qty 5

## 2017-12-02 NOTE — Progress Notes (Signed)
[place] Progress Note Patient Name: [name] DOB: 04/08/1961 MRN: [id number]   Date of Service  12/02/2017  HPI/Events of Note  resp acidosis - acute on chronic  eICU Interventions  increase RR to 18     Intervention Category Major Interventions: Acid-Base disturbance - evaluation and management  Rakesh V. Alva 12/02/2017, 2:14 AM

## 2017-12-02 NOTE — Procedures (Signed)
Intubation Procedure Note REMMY RIFFE [id number] Nov 28, 1960  Procedure: Intubation Indications: Airway protection and maintenance  Procedure Details Consent: Risks of procedure as well as the alternatives and risks of each were explained to the (patient/caregiver).  Consent for procedure obtained. Time Out: Verified patient identification, verified procedure, site/side was marked, verified correct patient position, special equipment/implants available, medications/allergies/relevent history reviewed, required imaging and test results available.  Performed  Maximum sterile technique was used including gloves, hand hygiene and mask.  MAC and 4 Glideoscope Grade 4 view-> swollen vocal cords, bleeding in oropharynx ETT size 6.5-> 26 at lip Attempted larger size ETT with no success   Evaluation Hemodynamic Status: BP stable throughout; O2 sats: transiently fell during during procedure Patient's Current Condition: stable Complications: No apparent complications Patient did tolerate procedure well. Chest X-ray ordered to verify placement.  CXR: tube position acceptable.   Rise Paganini [place] 12/02/2017

## 2017-12-02 NOTE — Progress Notes (Signed)
SLP Cancellation Note  Patient Details Name: [name] MRN: [id number] DOB: December 02, 1960   Cancelled treatment:       Reason Eval/Treat Not Completed: Patient not medically ready. Pt reintubated. Will s/o, please reorder when medically ready.  Deneise Lever, [place], [place] Speech-Language Pathologist [name] 12/02/2017, 8:30 AM

## 2017-12-02 NOTE — Progress Notes (Signed)
[facility] Pulmonary Diseases & Critical Care Medicine Progress Note  Patient Name: [name] MRN: [id number] DOB: Mar 25, 1961    ADMISSION DATE:  11/25/2017 CONSULTATION DATE:  11/26/2017  REFERRING MD:  Dr. Vernell Leep  REASON FOR CONSULTATION:  Stroke, airway compromise   ASSESSMENT/PLAN:  ASSESSMENT  Principal Problem:   Stroke [facility]) Active Problems:   Aphasia   Dysarthria   Oropharyngeal dysphagia   Bulbar palsy (HCC)   Aspiration pneumonia (HCC)   Endotracheally intubated   Postextubation stridor   Essential hypertension, benign   Acute kidney injury ([place])   Atelectasis, bilateral   History of completed stroke   Microcytic anemia   Encounter for nasogastric (NG) tube placement   PLAN/RECOMMENDATIONS   Sedation vacation daily. Will re-evaluate. Wife is prepared to proceed with tracheostomy (Monday) but re-evaluate for air leak prior to final decision, as the reason for re-intubating was stridor.  Propofol for sedation.  Off Zosyn/vancomycin. Check trach aspirate. CBC, CXR in am.  Place Foley catheter for urinary retention.  Renal dosing of medications   NUTRITION: restart TF   DVT PROPHYLAXIS: Lovenox  GI PROPHYLAXIS: famotidine    SUBJECTIVE:   -Interim events: Reintubated overnight due to stridor and increased work of breathing. The patient did initially have stridor after extubation yesterday morning that was treated with racemic epinephrine. But, this apparently recurred last night. And with concerns for ability to protect airway, he was reintubated. Nurse reports that the patient has urinary retention.   HISTORY OF PRESENT ILLNESS This 57 year old male was originally seen in consultation by Dr. Vernie Murders on 11/26/2017 for stroke. The patient was admitted on 11/25/2017 with acute speech difficulty and was diagnosed with an acute L basal ganglia infarct. 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 Neurology was consulted and was  concerned for possible brainstem vs b/l subcortical infarct.  The patient has an extensive medical history, including cocaine abuse and prior ICH (R basal ganglia hemorrhage 1999, L basal ganglia hemorrhage 2005).  REVIEW OF SYSTEMS: unable to obtain due to intubated status.   PAST MEDICAL/SURGICAL/SOCIAL/FAMILY HISTORY  Past Medical History:  Diagnosis Date  . Arthritis   . Chest pain    with a negative adenosine cardiolite in December 2006. Dr. Liana Crocker   . Cocaine abuse ([place])    last used in 2000; currently in remission; no IVDU to my knowledge  . Diastolic dysfunction    impaired L ventricle relatation with an EF of 65% and echo in December 2005  . Erectile dysfunction   . Hyperlipidemia    elevated transaminases on statin?-40's (2/06) - Hep B/C negative 4/06; resolution on 4/06 labs; off statin in 2006  . Hypertension    concentric LVH 2d echo 12/05; negative proteinuria 11/00  . 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 Onychomycosis April 2003   right big toe  . Prostate disorder    prostate irregularity with obstructive voiding symptoms (PSA 0.67); -likely related to overstimulation of alpha receptors at bladder neck; seen by Dr. Reece Agar in 9/05. no BPH at that visit  . Tinea versicolor    dermatology referral; secondary to no treatement response to selenium sulfide; currently resolive    Past Surgical History:  Procedure Laterality Date  .  COLONOSCOPY WITH PROPOFOL N/A 05/22/2013   Procedure: COLONOSCOPY WITH PROPOFOL;  Surgeon: Arta Silence, MD;  Location: [facility] ENDOSCOPY;  Service: Endoscopy;  Laterality: N/A;    Social History   Tobacco Use  . Smoking status: Never  Smoker  . Smokeless tobacco: Never Used  Substance Use Topics  . Alcohol use: Yes    Alcohol/week: 0.0 oz    Comment: Beer rarely.    Family History  Problem Relation Age of Onset  . Alcohol abuse Father   . Liver cancer Father   . Diabetes Father     Prior to Admission medications   Medication Sig Start Date End Date Taking? Authorizing Provider  amLODipine (NORVASC) 10 MG tablet Take 1 tablet (10 mg total) by mouth every morning. 05/29/17  Yes Tawny Asal, MD  aspirin 81 MG tablet Take 1 tablet (81 mg total) by mouth daily. 10/04/16  Yes Rivet, Sindy Guadeloupe, MD  hydrochlorothiazide (HYDRODIURIL) 25 MG tablet Take 1 tablet (25 mg total) by mouth every morning. 05/29/17  Yes Tawny Asal, MD  lisinopril (PRINIVIL,ZESTRIL) 20 MG tablet Take 1 tablet (20 mg total) by mouth daily. 05/29/17  Yes Tawny Asal, MD  Multiple Vitamins-Minerals (HM MULTIVITAMIN ADULT GUMMY PO) Take 1 tablet by mouth every morning.   Yes [provider]  atorvastatin (LIPITOR) 20 MG tablet Take 1 tablet (20 mg total) by mouth daily. Patient not taking: Reported on 11/25/2017 05/29/17   Tawny Asal, MD     No Known Allergies    Current Facility-Administered Medications:  .  0.9 %  sodium chloride infusion, 250 mL, Intravenous, PRN, Hammonds, Sharyn Blitz, MD, Stopped at 11/27/17 1235 .  0.9 %  sodium chloride infusion, , Intravenous, Continuous, Renee Pain, MD, Last Rate: 60 mL/hr at 12/02/17 0800 .  acetaminophen (TYLENOL) tablet 650 mg, 650 mg, Oral, Q4H PRN **OR** acetaminophen (TYLENOL) solution 650 mg, 650 mg, Per Tube, Q4H PRN **OR** acetaminophen (TYLENOL) suppository 650 mg, 650 mg, Rectal, Q4H PRN, Opyd, Timothy S, MD .  albuterol (PROVENTIL) (2.5 MG/3ML) 0.083% nebulizer solution 2.5 mg, 2.5 mg, Nebulization, Q2H PRN, Hammonds, Sharyn Blitz, MD, 2.5 mg at 12/01/17 2324 .  amLODipine (NORVASC) tablet 10 mg, 10 mg, Oral, q morning - 10a, Rosalin Hawking, MD, 10 mg at 12/01/17 0935 .  aspirin  tablet 325 mg, 325 mg, Oral, Daily, Hammonds, Sharyn Blitz, MD, 325 mg at 12/01/17 0935 .  atorvastatin (LIPITOR) tablet 80 mg, 80 mg, Oral, q1800, Opyd, Ilene Qua, MD, 80 mg at 11/30/17 1803 .  chlorhexidine gluconate (MEDLINE KIT) (PERIDEX) 0.12 % solution 15 mL, 15 mL, Mouth Rinse, BID, Hammonds, Sharyn Blitz, MD, 15 mL at 12/02/17 0748 .  docusate (COLACE) 50 MG/5ML liquid 100 mg, 100 mg, Per Tube, BID, Hammonds, Sharyn Blitz, MD, 100 mg at 12/01/17 0936 .  famotidine (PEPCID) IVPB 20 mg premix, 20 mg, Intravenous, Q24H, Priscella Mann, RPH, Stopped at 12/01/17 2237 .  fentaNYL (SUBLIMAZE) injection 100 mcg, 100 mcg, Intravenous, Q2H PRN, Hammonds, Sharyn Blitz, MD, 100 mcg at 12/02/17 0147 .  labetalol (NORMODYNE,TRANDATE) injection 5 mg, 5 mg, Intravenous, Q2H PRN, Opyd, Ilene Qua, MD, 5 mg at 12/01/17 2317 .  lisinopril (PRINIVIL,ZESTRIL) tablet 20 mg, 20 mg, Oral, BID, Rosalin Hawking, MD .  MEDLINE mouth rinse, 15 mL, Mouth Rinse, 10 times per day, Hammonds, Sharyn Blitz, MD, 15 mL at 12/02/17 0608 .  midazolam (VERSED) injection 2 mg, 2 mg, Intravenous, Q2H PRN, Hammonds, Sharyn Blitz, MD, 2 mg at 12/02/17 0313 .  ondansetron (ZOFRAN) injection 4 mg, 4 mg, Intravenous, Q6H PRN, Hammonds, Sharyn Blitz, MD .  propofol (DIPRIVAN) 1000 MG/100ML infusion, 0-50 mcg/kg/min, Intravenous, Continuous, Hammonds, Sharyn Blitz, MD, Last Rate: 14.7 mL/hr at 12/02/17 0823, 30 mcg/kg/min at 12/02/17 0823 .  sennosides (SENOKOT) 8.8 MG/5ML syrup 5 mL, 5 mL, Oral, BID, Hammonds, Sharyn Blitz, MD, 5 mL at 12/01/17 0935   OBJECTIVE:   VITAL SIGNS: BP (!) 134/95   Pulse 84   Temp 98.6 F (37 C) (Oral)   Resp 18   Ht _0  (1.702 m)   Wt 75.7 kg (166 lb 14.2 oz)   SpO2 100%   BMI 26.14 kg/m  Vitals:   12/02/17 0600 12/02/17 0700 12/02/17 0721 12/02/17 0800  BP:  134/88 (!) 123/92 (!) 134/95  Pulse: 90 87 91 84  Resp: _1 Temp:      TempSrc:      SpO2: 100% 100% 100% 100%  Weight:      Height:         HEMODYNAMICS:    VENTILATOR SETTINGS: Vent Mode: PRVC FiO2 (%):  [40 %-100 %] 40 % Set Rate:  [12 bmp-18 bmp] 18 bmp Vt Set:  [530 mL] 530 mL PEEP:  [5 cmH20] 5 cmH20 Plateau Pressure:  [18 cmH20] 18 cmH20  INTAKE / OUTPUT: I/O last 3 completed shifts: In: 6967 [I.V.:2220; NG/GT:720; IV Piggyback:600] Out: 2585 [Urine:2585]  PHYSICAL EXAMINATION: General: intubated and sedated Neuro: follows commands sluggishly. Dense paresis of L UE. B Babinski. DTR 3+ @ R UE, 1+ @ LUE, 2+ @ R LE, 2+ @ L LE. HEENT: ETT in situ. Copious oral secretions. Cardiovascular: Regular S1 and S2 without murmur, rub or gallop Lungs: Good air entry. Coarse breath sounds bilaterally. Rales and rhonchi. Abdomen:  Soft NTND, BS+ Musculoskeletal: no LE edema GU: condom catheter in place  Skin: no rashes    LABS:  BMET Recent Labs  Lab 11/30/17 0438 12/01/17 0837 12/02/17 0500  NA 147* 150* 149*  K 3.6 3.9 4.1  CL 113* 114* 113*  CO2 _2 BUN 22* 27* 29*  CREATININE 1.78* 1.79* 2.05*  GLUCOSE 128* 113* 120*    Electrolytes Recent Labs  Lab 11/26/17 0735  11/27/17 0449  11/28/17 0444  11/30/17 0438 12/01/17 0837 12/02/17 0500  CALCIUM 9.3   < > 8.8*  --  8.7*  --  8.7* 8.9 8.9  MG 1.9  --  2.0  --  2.1  --   --   --   --   PHOS  --   --  4.2   < > 3.2   < > 3.2 2.7 3.8   < > = values in this interval not displayed.    CBC Recent Labs  Lab 11/30/17 0438 12/01/17 0837 12/02/17 0500  WBC 4.5 4.6 5.6  HGB 11.2* 11.3* 11.6*  HCT 34.9* 36.0* 36.1*  PLT 189 201 205    Coag's Recent Labs  Lab 11/25/17 2246  APTT 30  INR 0.98    Sepsis Markers No results for input(s): LATICACIDVEN, PROCALCITON, O2SATVEN in the last 168 hours.  ABG Recent Labs  Lab 11/30/17 0340 12/02/17 0131 12/02/17 0500  PHART 7.418 7.334* 7.445  PCO2ART 44.0 58.4* 39.3  PO2ART 129* 453.0* 103    Liver Enzymes Recent Labs  Lab 11/25/17 2246 11/28/17 0444 11/30/17 0438  12/01/17 0837 12/02/17 0500  AST 27 44*  --   --   --   ALT 21  23  --   --   --   ALKPHOS 92 74  --   --   --   BILITOT 1.0 1.4*  --   --   --   ALBUMIN 3.9 2.7* 2.6* 2.7* 2.8*    Cardiac Enzymes No results for input(s): TROPONINI, PROBNP in the last 168 hours.  Glucose Recent Labs  Lab 12/01/17 1120 12/01/17 1526 12/01/17 2037 12/02/17 0109 12/02/17 0411 12/02/17 0750  GLUCAP 102* 102* 94 107* 110* 135*    Imaging Dg Chest Port 1 View  Result Date: 12/02/2017 CLINICAL DATA:  57 year old male status post intubation. EXAM: PORTABLE CHEST 1 VIEW COMPARISON:  Chest radiograph dated 11/28/2017 FINDINGS: Endotracheal tube approximately 3.4 cm above the carina. An enteric tube extends into the upper abdomen with tip likely in the distal stomach. There is mild cardiomegaly. Mild perivascular hazy densities may represent atelectatic changes or mild vascular congestion. An area of increased density at the left lung base may represent atelectatic changes. Pneumonia is not excluded. Clinical correlation is recommended. There is no large pleural effusion. No pneumothorax. No acute osseous pathology. IMPRESSION: 1. Endotracheal tube above the carina and enteric tube likely in the distal stomach. 2. Mild cardiomegaly. 3. Bilateral infrahilar probable atelectatic changes. Left lung base infiltrate is not entirely excluded. Clinical correlation is recommended. Electronically Signed   By: Anner Crete M.D.   On: 12/02/2017 01:14    CULTURES: Results for orders placed or performed during the hospital encounter of 11/25/17  Culture, respiratory (tracheal aspirate)     Status: None   Collection Time: 11/27/17 12:12 AM  Result Value Ref Range Status   Specimen Description TRACHEAL ASPIRATE  Final   Special Requests NONE  Final   Gram Stain   Final    FEW WBC PRESENT, PREDOMINANTLY PMN MODERATE GRAM POSITIVE COCCI IN PAIRS IN CHAINS FEW GRAM NEGATIVE COCCOBACILLI RARE GRAM POSITIVE RODS     Culture Consistent with normal respiratory flora.  Final   Report Status 11/29/2017 FINAL  Final  MRSA PCR Screening     Status: Abnormal   Collection Time: 11/27/17 12:20 AM  Result Value Ref Range Status   MRSA by PCR INVALID RESULTS, SPECIMEN SENT FOR CULTURE (A) NEGATIVE Final    Comment: CALLED TO LIL[place],T RN [phone] 11/27/17 MITCHELL,L        The GeneXpert MRSA Assay (FDA approved for NASAL specimens only), is one component of a comprehensive MRSA colonization surveillance program. It is not intended to diagnose MRSA infection nor to guide or monitor treatment for MRSA infections.   MRSA culture     Status: None   Collection Time: 11/27/17 12:20 AM  Result Value Ref Range Status   Specimen Description NASAL SWAB  Final   Special Requests NONE  Final   Culture NO MRSA DETECTED  Final   Report Status 11/28/2017 FINAL  Final    OTHER STUDIES:  -  ANTIBIOTICS: Zosyn 1/14-1/18 Vancomycin 1/14-1/18  SIGNIFICANT EVENTS: 1/12 >> Admitted 1/13 >> transfer to ICU. Intubated for airway protection   My assessment, plan of care, findings, medications, side effects, etc. were discussed with:  Nurse  Family (wife)   Renee Pain, MD Board Certified by the ABIM, [place] Pager: [phone]  12/02/2017, 8:35 AM

## 2017-12-02 NOTE — Progress Notes (Signed)
Nutrition Follow-up  INTERVENTION:   Change to Vital 1.2 at goal rate of 77m/hr- Regimen provides 1728kcal/day, 108g/day protein, and 11638mfree water   NUTRITION DIAGNOSIS:   Inadequate oral intake related to inability to eat as evidenced by NPO status. Ongoing.   GOAL:   Patient will meet greater than or equal to 90% of their needs Met.   MONITOR:   TF tolerance, I & O's, Vent status  ASSESSMENT:   Pt with PMH of HTN, CHF, hyperlipidemia, polysubstance abuse, CKD stage II, and multiple CVA. Pt admitted with stroke. Asarthria and dysphagia likely due to pseudobulbar etiology. Head CT showed possible acute left basal ganglia infarct. Emergently intubated 11/26/17 to protect airway due to difficulty managing secretions.   Pt extubated 1/18 and required reintubation 1/19. NGT removed 1/18; pt with OGT now. Restart vital. Spoke to RN, propofol off for now but she anticipates he may need it again later today. Per chart, pt with weight loss since admit; RD will continue to monitor. Pt may need trach and PEG tube.   Medications reviewed and include: aspirin, colace, senokot, NaCl @60ml /hr, pepcid, fentanyl, versed   Labs reviewed: Na 149(H), Cl 113(H), BUN 29(H), creat 2.05(H), alb 2.8(L) Triglycerides- 63- 1/16 cbgs- 113, 120 x 24 hrs   Patient is currently intubated on ventilator support MV: 8.9 L/min Temp (24hrs), Avg:99.1 F (37.3 C), Min:98.3 F (36.8 C), Max:99.7 F (37.6 C)  Propofol: none  MAP- >6072m  Diet Order:  Diet NPO time specified  EDUCATION NEEDS:   Not appropriate for education at this time  Skin:  Reviewed RN Assessment  Last BM:  1/17  Height:   Ht Readings from Last 1 Encounters:  11/25/17 5' 7"  (1.702 m)    Weight:   Wt Readings from Last 1 Encounters:  12/02/17 166 lb 14.2 oz (75.7 kg)    Ideal Body Weight:  67.3 kg  BMI:  Body mass index is 26.14 kg/m.  Estimated Nutritional Needs:   Kcal:  1831kcal/day   Protein:   105-120g/day   Fluid:  >1.8L/day or per MD  CasKoleen Distance, RD, LDN Pager #- [phone] Hours Pager: [phone]

## 2017-12-02 NOTE — Progress Notes (Signed)
PT Cancellation Note  Patient Details Name: [name] MRN: [id number] DOB: Aug 12, 1961   Cancelled Treatment:    Reason Eval/Treat Not Completed: Medical issues which prohibited therapy.  Reintubated and unable to participate. 12/02/2017  Donnella Sham, [place] [phone]  (pager)    Tessie Fass Mackinzie Vuncannon 12/02/2017, 11:23 AM

## 2017-12-02 NOTE — Progress Notes (Signed)
Order was given to place Foley catheter d/t urinary retention. Patient voided right before placement therefore not needing catheter at this time. Artha Stavros, Rande Brunt, RN

## 2017-12-02 NOTE — Progress Notes (Signed)
STROKE TEAM PROGRESS NOTE   SUBJECTIVE (INTERVAL HISTORY) His wife is at bedside. Pt was reintubated last night for resp stridor  and concerns for his ability to protect his airway,  patient is still sedated from his intubation and unresponsive on exam  Past Medical History:  Diagnosis Date  . Arthritis   . Chest pain    with a negative adenosine cardiolite in December 2006. Dr. Liana Crocker   . Cocaine abuse ([place])    last used in 2000; currently in remission; no IVDU to my knowledge  . Diastolic dysfunction    impaired L ventricle relatation with an EF of 65% and echo in December 2005  . Erectile dysfunction   . Hyperlipidemia    elevated transaminases on statin?-40's (2/06) - Hep B/C negative 4/06; resolution on 4/06 labs; off statin in 2006  . Hypertension    concentric LVH 2d echo 12/05; negative proteinuria 11/00  . 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 Onychomycosis April 2003   right big toe  . Prostate disorder    prostate irregularity with obstructive voiding symptoms (PSA 0.67); -likely related to overstimulation of alpha receptors at bladder neck; seen by Dr. Reece Agar in 9/05. no BPH at that visit  . Tinea versicolor    dermatology referral; secondary to no treatement response to selenium sulfide; currently resolive    HOME MEDICATIONS:  Current Meds  Medication Sig  . amLODipine (NORVASC) 10 MG tablet Take 1 tablet (10 mg total) by mouth every morning.  Marland Kitchen aspirin 81 MG tablet Take 1 tablet (81 mg total) by mouth daily.  . hydrochlorothiazide (HYDRODIURIL) 25 MG tablet Take 1 tablet (25 mg total) by mouth every morning.  Marland Kitchen lisinopril (PRINIVIL,ZESTRIL) 20 MG tablet Take 1  tablet (20 mg total) by mouth daily.  . Multiple Vitamins-Minerals (HM MULTIVITAMIN ADULT GUMMY PO) Take 1 tablet by mouth every morning.     HOSPITAL MEDICATIONS:  . [START ON 12/03/2017] amLODipine  10 mg Per Tube q morning - 10a  . [START ON 12/03/2017] aspirin  325 mg Per Tube Daily  . atorvastatin  80 mg Per Tube q1800  . chlorhexidine gluconate (MEDLINE KIT)  15 mL Mouth Rinse BID  . docusate  100 mg Per Tube BID  . lisinopril  20 mg Per Tube BID  . mouth rinse  15 mL Mouth Rinse 10 times per day  . sennosides  5 mL Per Tube BID    OBJECTIVE Temp:  [98.3 F (36.8 C)-99.6 F (37.6 C)] 99 F (37.2 C) (01/19 1200) Pulse Rate:  [82-121] 96 (01/19 1200) Cardiac Rhythm: Normal sinus rhythm (01/19 0800) Resp:  [12-36] 18 (01/19 1200) BP: (121-195)/(87-133) 161/102 (01/19 1200) SpO2:  [98 %-100 %] 99 % (01/19 1200) FiO2 (%):  [30 %-100 %] 30 % (01/19 1126) Weight:  [166 lb 14.2 oz (75.7 kg)] 166 lb 14.2 oz (75.7 kg) (01/19 0500)  CBC:  Recent Labs  Lab 12/01/17 0837 12/02/17 0500  WBC 4.6 5.6  NEUTROABS 2.8 5.0  HGB 11.3* 11.6*  HCT 36.0* 36.1*  MCV 72.7* 72.3*  PLT 201 518    Basic Metabolic Panel:  Recent Labs  Lab 11/27/17 0449  11/28/17 0444  12/01/17 0837 12/02/17 0500  NA 140  --  141   < > 150* 149*  K 3.7  --  3.7   < > 3.9 4.1  CL 108  --  111   < > 114* 113*  CO2 22  --  21*   < > 25 23  GLUCOSE 105*  --  125*   < > 113* 120*  BUN 18  --  21*   < > 27* 29*  CREATININE 2.37*  --  2.38*   < > 1.79* 2.05*  CALCIUM 8.8*  --  8.7*   < > 8.9 8.9  MG 2.0  --  2.1  --   --   --   PHOS 4.2   < > 3.2   < > 2.7 3.8   < > = values in this interval not displayed.    Lipid Panel:     Component Value Date/Time   CHOL 322 (H) 11/26/2017 0735   CHOL 295 (H) 05/29/2017 1537   TRIG 63 11/29/2017 2028   HDL 57 11/26/2017 0735   HDL 60 05/29/2017 1537   CHOLHDL 5.6 11/26/2017 0735   VLDL 14 11/26/2017 0735   LDLCALC 251 (H) 11/26/2017 0735   LDLCALC 224 (H)  05/29/2017 1537   HgbA1c:  Lab Results  Component Value Date   HGBA1C 5.7 (H) 11/26/2017   Urine Drug Screen:     Component Value Date/Time   LABOPIA NONE DETECTED 11/26/2017 0047   COCAINSCRNUR NONE DETECTED 11/26/2017 0047   LABBENZ NONE DETECTED 11/26/2017 0047   AMPHETMU NONE DETECTED 11/26/2017 0047   THCU NONE DETECTED 11/26/2017 0047   LABBARB NONE DETECTED 11/26/2017 0047    Alcohol Level     Component Value Date/Time   ETH <11 06/07/2013 1731    IMAGING I have personally reviewed the radiological images below and agree with the radiology interpretations.  CTA NECK:  1. No hemodynamically significant stenosis or acute vascular process in the neck.  2. 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 No pseudoaneurysm.  3. An incidental finding of potential clinical significance has been found.  Ectatic 3.6 cm ascending aorta.  Recommend annual imaging followup by CTA or MRA. This recommendation follows 2010 ACCF/AHA/AATS/ACR/ASA/SCA/SCAI/SIR/STS/SVM Guidelines for the Diagnosis and Management of Patients with Thoracic Aortic Disease. Circulation.2010; 121: Q595-G387  CTA HEAD:  1. No emergent large vessel occlusion. Focally occluded RIGHT M2/M3 segment.  2. Advanced intracranial atherosclerosis with multifocal high-grade stenosis including severe stenosis LEFT M2 segment, moderate to severe stenoses RIGHT middle cerebral artery and LEFT posterior cerebral artery.  3. 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   CT PERFUSION:  1. No acute perfusion defect.  2. Prolonged mean transit time RIGHT MCA consistent with stenoses.   CT Head Wo Contrast 11/25/2017 IMPRESSION: 1. Potentially acute LEFT corona radiata/basal ganglia infarct. 2. ASPECTS is 9. 3. Old bilateral basal ganglia infarcts. 4. Moderate to severe white matter changes compatible chronic small vessel ischemic disease. 5. Mild global parenchymal brain  volume loss for age.  TTE - Left ventricle: The cavity size was normal. Systolic function was   normal. The estimated ejection fraction was in the range of 60%   to 65%. Wall motion was normal; there were no regional wall   motion abnormalities. - Aortic valve: There was mild regurgitation. - Atrial septum: No defect or patent foramen ovale was identified. - Pulmonary arteries: PA peak pressure: 32 mm Hg (S).  Mr Brain Wo Contrast 11/27/2017 IMPRESSION: 1. Small acute infarcts scattered in  the left corona radiata, some tracking toward the posterior left lentiform. No associated acute hemorrhage or mass effect. 2. Underlying severe chronic ischemic disease in the bilateral deep gray matter nuclei with advanced chronic signal changes in the cerebral white matter, and brainstem Wallerian degeneration. 3. Intubated. Right nasoenteric tube in place. Fluid in the pharynx.    PHYSICAL EXAM  Temp:  [98.3 F (36.8 C)-99.6 F (37.6 C)] 99 F (37.2 C) (01/19 1200) Pulse Rate:  [82-121] 96 (01/19 1200) Resp:  [12-36] 18 (01/19 1200) BP: (121-195)/(87-133) 161/102 (01/19 1200) SpO2:  [98 %-100 %] 99 % (01/19 1200) FiO2 (%):  [30 %-100 %] 30 % (01/19 1126) Weight:  [166 lb 14.2 oz (75.7 kg)] 166 lb 14.2 oz (75.7 kg) (01/19 0500)  General - Well nourished, well developed, intubated off sedation.  Ophthalmologic - Fundi not visualized due to small pupils.  Cardiovascular - Regular rate and rhythm.  Neuro - intubated off sedation, unresponsive. Partially opens eyes to sternal rub. Will not follow any commands. Eyes are closed. Bilateral small pupils, positive corneals bilaterally, positive gag and cough. On pain stimulation, minimum withdrawal response in the left upper, left lower and right lower extremiity DTR normal on the right, but significantly increased on the left, babinski positive on the left. Still has significant spasticity at LUE and LLE. Sensation, coordination and gait not  tested.   ASSESSMENT/PLAN [name] is a 57 y.o. 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 asarthria. He did not receive IV t-PA due to initial improvement in deficits and late presentation.   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  MRI head - left BG and CR 2 small infarcts  CTA head and neck - right M1, M2, and left M2 stenosis  2D Echo - EF 60-65%  LDL - 251  HgbA1c - 5.7  UDS - negative   VTE prophylaxis - Lovenox Diet NPO time specified  aspirin 81 mg daily prior to admission, now on ASA 314m. Hold off plavix for possible trach and PEG procedure  Patient will be counseled to be compliant with his antithrombotic medications  Ongoing aggressive stroke risk factor management  Therapy recommendations:  pending  Disposition:  Pending  Respiratory distress  Due to pseudobulbar etiology  Intubated now  CCM on board  Off zosyn and vanco   Discussed with wife and Dr. JCarson Myrtle will do extubation trial today, if failed will re-intubate and consider trach next week  Asarthria and dysphagia   Pseudobulbar etiology with b/l involvement  On TF  May need PEG in the future if failed extubation  AKI, improving  Cre 1.30->1.97->2.37->2.38->1.78->1.79  Likely due to dehydration due to perfuse sweating and lack of po fluid  On IVF @ 60  On TF @45   Close monitoring  History of stroke  First stroke in 1999 as per wife  06/2004 CT head showed old right BG encephalomalacia  12/2006 left BG ICH, MRA h/n unremarkable  [id number]/7858- left PLIC and CR infarct    Hypertension BP high today, may related to agitation on vent On norvasc and lisinopril Increase lisinopril to 219mbid Labetalol PRN Long-term BP goal normotensive  Hyperlipidemia  Home meds: lipitor  20.  LDL 251, goal < 70  Now on Lipitor 80 mg daily  Continue statin at discharge  Other Stroke Risk Factors  Former cigarette smoker - quit  ETOH use, advised to drink no more than 1 drink per day.  Hx of substance abuse  Other Active Problems  Hypokalemia - resolved after supplement  History of cocaine abuse - UDS negative   Hospital day # 6 IV had a long discussion the patient's wife with regards to his prognosis. If patient is unable to protect his airway and be extubated he may end up requiring tracheostomy and PEG tube. Wife understands and would like to give him a few more days before making that decision. Discussed with Dr. Ozella Rocks critical care medicine .Plan check CT scan of the head tomorrow morning  This patient is critically ill due to respiratory distress, stroke, dysphagia, asarthria and at significant risk of neurological worsening, death form recurrent stroke, seizure, aspiration pneumonia, heart failure, renal failure. This patient's care requires constant monitoring of vital signs, hemodynamics, respiratory and cardiac monitoring, review of multiple databases, neurological assessment, discussion with family, other specialists and medical decision making of high complexity. I had discussion with wife at bedside, updated pt current condition, treatment plan and potential prognosis. She decided on extubation trial. I also discussed with Dr. Carson Myrtle. I spent 35 minutes of neurocritical care time in the care of this patient.  Antony Contras, MD Stroke Neurology 12/02/2017 12:55 PM   To contact Stroke Continuity provider, please refer to [email or web address] After hours, contact General Neurology

## 2017-12-03 ENCOUNTER — Inpatient Hospital Stay (HOSPITAL_COMMUNITY): Payer: Medicare HMO

## 2017-12-03 LAB — CBC WITH DIFFERENTIAL/PLATELET
BASOS PCT: 0 %
Basophils Absolute: 0 10*3/uL (ref 0.0–0.1)
Eosinophils Absolute: 0 10*3/uL (ref 0.0–0.7)
Eosinophils Relative: 0 %
HEMATOCRIT: 33.2 % — AB (ref 39.0–52.0)
Hemoglobin: 10.5 g/dL — ABNORMAL LOW (ref 13.0–17.0)
Lymphocytes Relative: 18 %
Lymphs Abs: 1.2 10*3/uL (ref 0.7–4.0)
MCH: 22.8 pg — ABNORMAL LOW (ref 26.0–34.0)
MCHC: 31.6 g/dL (ref 30.0–36.0)
MCV: 72 fL — AB (ref 78.0–100.0)
MONO ABS: 0.8 10*3/uL (ref 0.1–1.0)
MONOS PCT: 12 %
Neutro Abs: 4.6 10*3/uL (ref 1.7–7.7)
Neutrophils Relative %: 70 %
Platelets: 208 10*3/uL (ref 150–400)
RBC: 4.61 MIL/uL (ref 4.22–5.81)
RDW: 14.8 % (ref 11.5–15.5)
WBC: 6.6 10*3/uL (ref 4.0–10.5)

## 2017-12-03 LAB — GLUCOSE, CAPILLARY
GLUCOSE-CAPILLARY: 126 mg/dL — AB (ref 65–99)
Glucose-Capillary: 135 mg/dL — ABNORMAL HIGH (ref 65–99)
Glucose-Capillary: 138 mg/dL — ABNORMAL HIGH (ref 65–99)
Glucose-Capillary: 116 mg/dL — ABNORMAL HIGH (ref 65–99)
Glucose-Capillary: 119 mg/dL — ABNORMAL HIGH (ref 65–99)

## 2017-12-03 LAB — RENAL FUNCTION PANEL
ANION GAP: 10 (ref 5–15)
Albumin: 2.5 g/dL — ABNORMAL LOW (ref 3.5–5.0)
BUN: 45 mg/dL — ABNORMAL HIGH (ref 6–20)
CALCIUM: 8.4 mg/dL — AB (ref 8.9–10.3)
CO2: 24 mmol/L (ref 22–32)
Chloride: 117 mmol/L — ABNORMAL HIGH (ref 101–111)
Creatinine, Ser: 1.84 mg/dL — ABNORMAL HIGH (ref 0.61–1.24)
GFR calc Af Amer: 46 mL/min — ABNORMAL LOW (ref >60)
GFR calc non Af Amer: 40 mL/min — ABNORMAL LOW (ref >60)
GLUCOSE: 131 mg/dL — AB (ref 65–99)
Phosphorus: 2.7 mg/dL (ref 2.5–4.6)
Potassium: 3.8 mmol/L (ref 3.5–5.1)
SODIUM: 151 mmol/L — AB (ref 135–145)

## 2017-12-03 MED ORDER — DEXTROSE-NACL 5-0.45 % IV SOLN
INTRAVENOUS | Status: DC
  Administered 2017-12-03 – 2017-12-05 (×4): via INTRAVENOUS
  Administered 2017-12-07 – 2017-12-08 (×2): 50 mL/h via INTRAVENOUS
  Administered 2017-12-09 – 2017-12-12 (×6): via INTRAVENOUS

## 2017-12-03 NOTE — Progress Notes (Signed)
[facility] Progress Note Patient Name: [name] DOB: 05/18/1961 MRN: [id number]   Date of Service  12/03/2017  HPI/Events of Note  Agitation - Request to renew bilateral soft wrist restraints.   eICU Interventions  Will renew soft bilateral wrist restraints.      Intervention Category Minor Interventions: Agitation / anxiety - evaluation and management  Sommer,Steven Eugene 12/03/2017, 1:22 AM

## 2017-12-03 NOTE — Progress Notes (Signed)
OT Cancellation    12/03/17 1300  OT Visit Information  Last OT Received On 12/03/17  Reason Eval/Treat Not Completed Patient not medically ready. Pt intubated. Per RN, wait till pt more medically ready. Will return as schedule allows and pt medically ready. Thank you.   [place], OTR/L Acute Rehab Pager: [phone] Office: [phone]

## 2017-12-03 NOTE — Progress Notes (Addendum)
[facility] Pulmonary Diseases & Critical Care Medicine Progress Note  Patient Name: [name] MRN: [id number] DOB: 02/16/1961    ADMISSION DATE:  11/25/2017 CONSULTATION DATE:  11/26/2017  REFERRING MD:  Dr. Vernell Leep  REASON FOR CONSULTATION:  Stroke, airway compromise   ASSESSMENT/PLAN:  ASSESSMENT  Principal Problem:   Stroke [facility]) Active Problems:   Aphasia   Dysarthria   Oropharyngeal dysphagia   Bulbar palsy (HCC)   Aspiration pneumonia (HCC)   Endotracheally intubated   Postextubation stridor   Essential hypertension, benign   Acute kidney injury (HCC)   Atelectasis, bilateral   History of completed stroke   Microcytic anemia   Encounter for nasogastric (NG) tube placement   PLAN/RECOMMENDATIONS   Off sedation for EEG.  Tracheostomy is tentatively planned for Monday.  Off Zosyn/vancomycin. No leukocytosis. CXR improving.  Change IV fluids to D5 1/2NS @ 50 mL/hr  Renal dosing of medications   NUTRITION: continue TF   DVT PROPHYLAXIS: Lovenox  GI PROPHYLAXIS: famotidine    SUBJECTIVE:   -Interim events: eyes open but not following commands. L gaze preference. Hyperreflexic. Case discussed with Dr. Leonie Man.   HISTORY OF PRESENT ILLNESS This 57 year old male was originally seen in consultation by Dr. Vernie [name] on 11/26/2017 for stroke. The patient was admitted on 11/25/2017 with acute speech difficulty and was diagnosed with an acute L basal ganglia infarct. 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 Neurology was consulted and was concerned for possible brainstem vs b/l subcortical infarct.  The patient has an extensive medical history, including cocaine abuse and prior ICH (R basal ganglia hemorrhage 1999, L basal ganglia hemorrhage 2005).  REVIEW OF SYSTEMS: unable to obtain due to intubated status.   PAST MEDICAL/SURGICAL/SOCIAL/FAMILY HISTORY  Past Medical History:  Diagnosis Date  . Arthritis   . Chest pain     with a negative adenosine cardiolite in December 2006. Dr. Liana Crocker   . Cocaine abuse ([place])    last used in 2000; currently in remission; no IVDU to my knowledge  . Diastolic dysfunction    impaired L ventricle relatation with an EF of 65% and echo in December 2005  . Erectile dysfunction   . Hyperlipidemia    elevated transaminases on statin?-40's (2/06) - Hep B/C negative 4/06; resolution on 4/06 labs; off statin in 2006  . Hypertension    concentric LVH 2d echo 12/05; negative proteinuria 11/00  . 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 Onychomycosis April 2003   right big toe  . Prostate disorder    prostate irregularity with obstructive voiding symptoms (PSA 0.67); -likely related to overstimulation of alpha receptors at bladder neck; seen by Dr. Reece Agar in 9/05. no BPH at that visit  . Tinea versicolor    dermatology referral; secondary to no treatement response to selenium sulfide; currently resolive    Past Surgical History:  Procedure Laterality Date  . COLONOSCOPY WITH PROPOFOL N/A 05/22/2013   Procedure: COLONOSCOPY WITH PROPOFOL;  Surgeon: Arta Silence, MD;  Location: [facility];  Service: Endoscopy;  Laterality: N/A;    Social History   Tobacco Use  . Smoking status: Never Smoker  . Smokeless tobacco: Never Used  Substance Use Topics  . Alcohol use: Yes  Alcohol/week: 0.0 oz    Comment: Beer rarely.    Family History  Problem Relation Age of Onset  . Alcohol abuse Father   . Liver cancer Father   . Diabetes Father     Prior to Admission medications   Medication Sig Start Date End Date Taking? Authorizing Provider  amLODipine (NORVASC) 10 MG tablet Take 1  tablet (10 mg total) by mouth every morning. 05/29/17  Yes Tawny Asal, MD  aspirin 81 MG tablet Take 1 tablet (81 mg total) by mouth daily. 10/04/16  Yes Rivet, Sindy Guadeloupe, MD  hydrochlorothiazide (HYDRODIURIL) 25 MG tablet Take 1 tablet (25 mg total) by mouth every morning. 05/29/17  Yes Tawny Asal, MD  lisinopril (PRINIVIL,ZESTRIL) 20 MG tablet Take 1 tablet (20 mg total) by mouth daily. 05/29/17  Yes Tawny Asal, MD  Multiple Vitamins-Minerals (HM MULTIVITAMIN ADULT GUMMY PO) Take 1 tablet by mouth every morning.   Yes [provider]  atorvastatin (LIPITOR) 20 MG tablet Take 1 tablet (20 mg total) by mouth daily. Patient not taking: Reported on 11/25/2017 05/29/17   Tawny Asal, MD     No Known Allergies    Current Facility-Administered Medications:  .  0.9 %  sodium chloride infusion, 250 mL, Intravenous, PRN, Hammonds, Sharyn Blitz, MD, Last Rate: 10 mL/hr at 12/03/17 0700, 250 mL at 12/03/17 0700 .  0.9 %  sodium chloride infusion, , Intravenous, Continuous, Renee Pain, MD, Last Rate: 60 mL/hr at 12/03/17 0700 .  acetaminophen (TYLENOL) tablet 650 mg, 650 mg, Oral, Q4H PRN **OR** acetaminophen (TYLENOL) solution 650 mg, 650 mg, Per Tube, Q4H PRN **OR** acetaminophen (TYLENOL) suppository 650 mg, 650 mg, Rectal, Q4H PRN, Opyd, Timothy S, MD .  albuterol (PROVENTIL) (2.5 MG/3ML) 0.083% nebulizer solution 2.5 mg, 2.5 mg, Nebulization, Q2H PRN, Hammonds, Sharyn Blitz, MD, 2.5 mg at 12/01/17 2324 .  amLODipine (NORVASC) tablet 10 mg, 10 mg, Per Tube, q morning - 10a, Hammonds, Sharyn Blitz, MD .  aspirin tablet 325 mg, 325 mg, Per Tube, Daily, Hammonds, Sharyn Blitz, MD .  atorvastatin (LIPITOR) tablet 80 mg, 80 mg, Per Tube, q1800, Hammonds, Sharyn Blitz, MD, 80 mg at 12/02/17 1804 .  chlorhexidine gluconate (MEDLINE KIT) (PERIDEX) 0.12 % solution 15 mL, 15 mL, Mouth Rinse, BID, Hammonds, Sharyn Blitz, MD, 15 mL at 12/03/17 0900 .  docusate (COLACE) 50 MG/5ML liquid 100 mg, 100 mg, Per  Tube, BID, Hammonds, Sharyn Blitz, MD, 100 mg at 12/02/17 2209 .  famotidine (PEPCID) IVPB 20 mg premix, 20 mg, Intravenous, Q24H, Priscella Mann, RPH, Stopped at 12/02/17 2239 .  feeding supplement (VITAL AF 1.2 CAL) liquid 1,000 mL, 1,000 mL, Per Tube, Continuous, Hammonds, Sharyn Blitz, MD, Last Rate: 60 mL/hr at 12/03/17 0700, 1,000 mL at 12/03/17 0700 .  fentaNYL (SUBLIMAZE) injection 100 mcg, 100 mcg, Intravenous, Q2H PRN, Hammonds, Sharyn Blitz, MD, 100 mcg at 12/02/17 0147 .  labetalol (NORMODYNE,TRANDATE) injection 5 mg, 5 mg, Intravenous, Q2H PRN, Opyd, Ilene Qua, MD, 5 mg at 12/02/17 2209 .  lisinopril (PRINIVIL,ZESTRIL) tablet 20 mg, 20 mg, Per Tube, BID, Hammonds, Sharyn Blitz, MD, 20 mg at 12/02/17 2210 .  MEDLINE mouth rinse, 15 mL, Mouth Rinse, 10 times per day, Hammonds, Sharyn Blitz, MD, 15 mL at 12/03/17 0600 .  midazolam (VERSED) injection 2 mg, 2 mg, Intravenous, Q2H PRN, Hammonds, Sharyn Blitz, MD, 2 mg at 12/02/17 0313 .  ondansetron (ZOFRAN) injection 4 mg, 4 mg, Intravenous, Q6H PRN, Hammonds, Sharyn Blitz, MD .  propofol (DIPRIVAN)  1000 MG/100ML infusion, 0-50 mcg/kg/min, Intravenous, Continuous, Hammonds, Sharyn Blitz, MD, Stopped at 12/03/17 [phone] .  sennosides (SENOKOT) 8.8 MG/5ML syrup 5 mL, 5 mL, Per Tube, BID, Hammonds, Sharyn Blitz, MD, 5 mL at 12/02/17 2209   OBJECTIVE:   VITAL SIGNS: BP (!) 141/96   Pulse 96   Temp 99.3 F (37.4 C) (Axillary)   Resp 20   Ht _0  (1.702 m)   Wt 79.6 kg (175 lb 7.8 oz)   SpO2 99%   BMI 27.49 kg/m  Vitals:   12/03/17 0800 12/03/17 0843 12/03/17 0859 12/03/17 0900  BP: 133/86 134/88 (!) 141/96 (!) 141/96  Pulse: 84 89 (!) 101 96  Resp: 18 19 (!) 22 20  Temp: 99.3 F (37.4 C)     TempSrc: Axillary     SpO2: 99% 99% 100% 99%  Weight:      Height:        HEMODYNAMICS:    VENTILATOR SETTINGS: Vent Mode: PSV;CPAP FiO2 (%):  [30 %-40 %] 40 % Set Rate:  [18 bmp] 18 bmp Vt Set:  [530 mL] 530 mL PEEP:  [5 cmH20] 5 cmH20 Pressure  Support:  [5 cmH20] 5 cmH20 Plateau Pressure:  [14 cmH20-16 cmH20] 14 cmH20  INTAKE / OUTPUT: I/O last 3 completed shifts: In: 3742.2 [I.V.:2188.7; NG/GT:1353.5; IV Piggyback:200] Out: 2010 [Urine:2010]  PHYSICAL EXAMINATION: General: intubated and sedated Neuro: does not follow commands. L gaze preference. No nystagmus. B Babinski. DTR 3+ @ R biceps, 3+ @ bL biceps, 3+ @ R patellar, 3+ @ L patellar. HEENT: ETT in situ. Copious oral secretions. Cardiovascular: Regular S1 and S2 without murmur, rub or gallop Lungs: Good air entry. Coarse breath sounds bilaterally. Scant rales and rhonchi. Abdomen:  Soft NTND, BS+ Musculoskeletal: no LE edema GU: condom catheter in place  Skin: no rashes    LABS:  BMET Recent Labs  Lab 12/01/17 0837 12/02/17 0500 12/03/17 0629  NA 150* 149* 151*  K 3.9 4.1 3.8  CL 114* 113* 117*  CO2 _1 BUN 27* 29* 45*  CREATININE 1.79* 2.05* 1.84*  GLUCOSE 113* 120* 131*    Electrolytes Recent Labs  Lab 11/27/17 0449  11/28/17 0444  12/01/17 0837 12/02/17 0500 12/03/17 0629  CALCIUM 8.8*  --  8.7*   < > 8.9 8.9 8.4*  MG 2.0  --  2.1  --   --   --   --   PHOS 4.2   < > 3.2   < > 2.7 3.8 2.7   < > = values in this interval not displayed.    CBC Recent Labs  Lab 12/01/17 0837 12/02/17 0500 12/03/17 0629  WBC 4.6 5.6 6.6  HGB 11.3* 11.6* 10.5*  HCT 36.0* 36.1* 33.2*  PLT 201 205 208    Coag's No results for input(s): APTT, INR in the last 168 hours.  Sepsis Markers No results for input(s): LATICACIDVEN, PROCALCITON, O2SATVEN in the last 168 hours.  ABG Recent Labs  Lab 11/30/17 0340 12/02/17 0131 12/02/17 0500  PHART 7.418 7.334* 7.445  PCO2ART 44.0 58.4* 39.3  PO2ART 129* 453.0* 103    Liver Enzymes Recent Labs  Lab 11/28/17 0444  12/01/17 0837 12/02/17 0500 12/03/17 0629  AST 44*  --   --   --   --   ALT 23  --   --   --   --   ALKPHOS 74  --   --   --   --   BILITOT 1.4*  --   --   --   --  ALBUMIN 2.7*    < > 2.7* 2.8* 2.5*   < > = values in this interval not displayed.    Cardiac Enzymes No results for input(s): TROPONINI, PROBNP in the last 168 hours.  Glucose Recent Labs  Lab 12/02/17 1152 12/02/17 1536 12/02/17 1927 12/02/17 2325 12/03/17 0315 12/03/17 0707  GLUCAP 182* 180* 145* 116* 135* 126*    Imaging Ct Head Wo Contrast  Result Date: 12/03/2017 CLINICAL DATA:  Follow-up of the lacunar infarcts EXAM: CT HEAD WITHOUT CONTRAST TECHNIQUE: Contiguous axial images were obtained from the base of the skull through the vertex without intravenous contrast. COMPARISON:  Brain MRI 11/27/2017 FINDINGS: Brain: Hypoattenuation in the left basal ganglia and corona radiata corresponds to known recent infarcts. No hemorrhage or significant mass effect. There is encephalomalacia of the right external capsule with hypoattenuation in the bilateral periventricular white matter, right worse than left. There is ex vacuo dilatation of the right lateral ventricle. Vascular: No hyperdense vessel or unexpected calcification. Skull: Normal visualized skull base, calvarium and extracranial soft tissues. Sinuses/Orbits: No sinus fluid levels or advanced mucosal thickening. No mastoid effusion. Normal orbits. IMPRESSION: Expected evolution of left basal ganglia/corona radiata infarcts without acute hemorrhage or significant mass effect. Electronically Signed   By: Ulyses Jarred M.D.   On: 12/03/2017 05:03   Dg Chest Port 1 View  Result Date: 12/03/2017 CLINICAL DATA:  57 year old male with history of aspiration. EXAM: PORTABLE CHEST 1 VIEW COMPARISON:  Chest x-ray 12/02/2017. FINDINGS: An endotracheal tube is in place with tip 3.2 cm above the carina. A nasogastric tube is seen extending into the stomach, however, the tip of the nasogastric tube extends below the lower margin of the image. Lung volumes are slightly low. Improving aeration in the left lung base, suggesting resolving areas of atelectasis and/or  consolidation. Right lung is clear. No pleural effusions. No evidence of pulmonary edema. Heart size is mildly enlarged. Upper mediastinal contours are unremarkable. IMPRESSION: 1. Support apparatus, as above. 2. Improving aeration in the left lung base, likely to reflect areas of resolving atelectasis and/or consolidation. 3. Mild cardiomegaly. Electronically Signed   By: Vinnie Langton M.D.   On: 12/03/2017 07:32    CULTURES: Results for orders placed or performed during the hospital encounter of 11/25/17  Culture, respiratory (tracheal aspirate)     Status: None   Collection Time: 11/27/17 12:12 AM  Result Value Ref Range Status   Specimen Description TRACHEAL ASPIRATE  Final   Special Requests NONE  Final   Gram Stain   Final    FEW WBC PRESENT, PREDOMINANTLY PMN MODERATE GRAM POSITIVE COCCI IN PAIRS IN CHAINS FEW GRAM NEGATIVE COCCOBACILLI RARE GRAM POSITIVE RODS    Culture Consistent with normal respiratory flora.  Final   Report Status 11/29/2017 FINAL  Final  MRSA PCR Screening     Status: Abnormal   Collection Time: 11/27/17 12:20 AM  Result Value Ref Range Status   MRSA by PCR INVALID RESULTS, SPECIMEN SENT FOR CULTURE (A) NEGATIVE Final    Comment: CALLED TO LILLEY,T RN [phone] 11/27/17 MITCHELL,L        The GeneXpert MRSA Assay (FDA approved for NASAL specimens only), is one component of a comprehensive MRSA colonization surveillance program. It is not intended to diagnose MRSA infection nor to guide or monitor treatment for MRSA infections.   MRSA culture     Status: None   Collection Time: 11/27/17 12:20 AM  Result Value Ref Range Status   Specimen Description NASAL SWAB  Final   Special Requests NONE  Final   Culture NO MRSA DETECTED  Final   Report Status 11/28/2017 FINAL  Final    OTHER STUDIES:  -  ANTIBIOTICS: Zosyn 1/14-1/18 Vancomycin 1/14-1/18  SIGNIFICANT EVENTS: 1/12 >> Admitted 1/13 >> transfer to ICU. Intubated for airway protection   My  assessment, plan of care, findings, medications, side effects, etc. were discussed with:  Nurse   Renee Pain, MD Board Certified by the ABIM, [place] Pager: [phone]  12/03/2017, 9:11 AM

## 2017-12-03 NOTE — Progress Notes (Signed)
EEG Completed; Results Pending  

## 2017-12-03 NOTE — Procedures (Signed)
EEG Report  Clinical History:  Staring and aphasia for a few minutes x 2  Technical Summary:  A 19 channel digital EEG recording was performed using the 10-20 international system of electrode placement.  Bipolar and Referential montages were used.  The total recording time was approx 20 minutes.  Findings:  There is a posterior dominant rhythm of 8 Hz reactive to eye opening and closure.  No focal slowing is present.  During the recording there is intermittent generalized theta frequency slowing.  There are no epileptiform discharges or electrographic seizures.    Impression:  This is an abnormal EEG.  There is evidence of intermittent moderate slowing of brain activity consistent with a metabolic, toxic, infectious, or hypoxic etiology.  The patient is not in non-convulsive status epilepticus.  Rogue Jury, MS, MD

## 2017-12-03 NOTE — Progress Notes (Signed)
STROKE TEAM PROGRESS NOTE   SUBJECTIVE (INTERVAL HISTORY) His wife is not at bedside. Pt  remains quite unresponsive. After propofol is stopped he barely opens eyes but does not follow any commands. He has left gaze deviation. Repeat CT scan of the head this morning shows stable appearance of his subcortical infarcts without any worsening or acute change  Past Medical History:  Diagnosis Date  . Arthritis   . Chest pain    with a negative adenosine cardiolite in December 2006. Dr. Liana Crocker   . Cocaine abuse ([place])    last used in 2000; currently in remission; no IVDU to my knowledge  . Diastolic dysfunction    impaired L ventricle relatation with an EF of 65% and echo in December 2005  . Erectile dysfunction   . Hyperlipidemia    elevated transaminases on statin?-40's (2/06) - Hep B/C negative 4/06; resolution on 4/06 labs; off statin in 2006  . Hypertension    concentric LVH 2d echo 12/05; negative proteinuria 11/00  . 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 Onychomycosis April 2003   right big toe  . Prostate disorder    prostate irregularity with obstructive voiding symptoms (PSA 0.67); -likely related to overstimulation of alpha receptors at bladder neck; seen by Dr. Reece Agar in 9/05. no BPH at that visit  . Tinea versicolor    dermatology referral; secondary to no treatement response to selenium sulfide; currently resolive    HOME MEDICATIONS:  Current Meds  Medication Sig  . amLODipine (NORVASC) 10 MG tablet Take 1 tablet (10 mg total) by mouth every morning.  Marland Kitchen aspirin 81 MG tablet Take 1 tablet (81 mg total) by mouth daily.  . hydrochlorothiazide (HYDRODIURIL) 25 MG tablet Take  1 tablet (25 mg total) by mouth every morning.  Marland Kitchen lisinopril (PRINIVIL,ZESTRIL) 20 MG tablet Take 1 tablet (20 mg total) by mouth daily.  . Multiple Vitamins-Minerals (HM MULTIVITAMIN ADULT GUMMY PO) Take 1 tablet by mouth every morning.     HOSPITAL MEDICATIONS:  . amLODipine  10 mg Per Tube q morning - 10a  . aspirin  325 mg Per Tube Daily  . atorvastatin  80 mg Per Tube q1800  . chlorhexidine gluconate (MEDLINE KIT)  15 mL Mouth Rinse BID  . docusate  100 mg Per Tube BID  . lisinopril  20 mg Per Tube BID  . mouth rinse  15 mL Mouth Rinse 10 times per day  . sennosides  5 mL Per Tube BID    OBJECTIVE Temp:  [98.8 F (37.1 C)-100 F (37.8 C)] 99.3 F (37.4 C) (01/20 0800) Pulse Rate:  [84-112] 87 (01/20 1123) Cardiac Rhythm: Normal sinus rhythm (01/20 1000) Resp:  [17-23] 18 (01/20 1123) BP: (129-173)/(86-134) 143/98 (01/20 1123) SpO2:  [98 %-100 %] 100 % (01/20 1123) FiO2 (%):  [30 %-40 %] 40 % (01/20 1123) Weight:  [175 lb 7.8 oz (79.6 kg)] 175 lb 7.8 oz (79.6 kg) (01/20 0400)  CBC:  Recent Labs  Lab 12/02/17 0500 12/03/17 0629  WBC 5.6 6.6  NEUTROABS 5.0 4.6  HGB 11.6* 10.5*  HCT 36.1* 33.2*  MCV 72.3* 72.0*  PLT 205 119    Basic Metabolic Panel:  Recent Labs  Lab 11/27/17 0449  11/28/17  2409  12/02/17 0500 12/03/17 0629  NA 140  --  141   < > 149* 151*  K 3.7  --  3.7   < > 4.1 3.8  CL 108  --  111   < > 113* 117*  CO2 22  --  21*   < > 23 24  GLUCOSE 105*  --  125*   < > 120* 131*  BUN 18  --  21*   < > 29* 45*  CREATININE 2.37*  --  2.38*   < > 2.05* 1.84*  CALCIUM 8.8*  --  8.7*   < > 8.9 8.4*  MG 2.0  --  2.1  --   --   --   PHOS 4.2   < > 3.2   < > 3.8 2.7   < > = values in this interval not displayed.    Lipid Panel:     Component Value Date/Time   CHOL 322 (H) 11/26/2017 0735   CHOL 295 (H) 05/29/2017 1537   TRIG 55 12/02/2017 1950   HDL 57 11/26/2017 0735   HDL 60 05/29/2017 1537   CHOLHDL 5.6 11/26/2017 0735   VLDL 14 11/26/2017  0735   LDLCALC 251 (H) 11/26/2017 0735   LDLCALC 224 (H) 05/29/2017 1537   HgbA1c:  Lab Results  Component Value Date   HGBA1C 5.7 (H) 11/26/2017   Urine Drug Screen:     Component Value Date/Time   LABOPIA NONE DETECTED 11/26/2017 0047   COCAINSCRNUR NONE DETECTED 11/26/2017 0047   LABBENZ NONE DETECTED 11/26/2017 0047   AMPHETMU NONE DETECTED 11/26/2017 0047   THCU NONE DETECTED 11/26/2017 0047   LABBARB NONE DETECTED 11/26/2017 0047    Alcohol Level     Component Value Date/Time   ETH <11 06/07/2013 1731    IMAGING I have personally reviewed the radiological images below and agree with the radiology interpretations.  CTA NECK:  1. No hemodynamically significant stenosis or acute vascular process in the neck.  2. 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 No pseudoaneurysm.  3. An incidental finding of potential clinical significance has been found.  Ectatic 3.6 cm ascending aorta.  Recommend annual imaging followup by CTA or MRA. This recommendation follows 2010 ACCF/AHA/AATS/ACR/ASA/SCA/SCAI/SIR/STS/SVM Guidelines for the Diagnosis and Management of Patients with Thoracic Aortic Disease. Circulation.2010; 121: B353-G992  CTA HEAD:  1. No emergent large vessel occlusion. Focally occluded RIGHT M2/M3 segment.  2. Advanced intracranial atherosclerosis with multifocal high-grade stenosis including severe stenosis LEFT M2 segment, moderate to severe stenoses RIGHT middle cerebral artery and LEFT posterior cerebral artery.  3. 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   CT PERFUSION:  1. No acute perfusion defect.  2. Prolonged mean transit time RIGHT MCA consistent with stenoses.   CT Head Wo Contrast 11/25/2017 IMPRESSION: 1. Potentially acute LEFT corona radiata/basal ganglia infarct. 2. ASPECTS is 9. 3. Old bilateral basal ganglia infarcts. 4. Moderate to severe white matter changes compatible chronic  small vessel ischemic disease. 5. Mild global parenchymal brain volume loss for age.  TTE - Left ventricle: The cavity size was normal. Systolic function was   normal. The estimated ejection fraction was in the range of 60%   to 65%. Wall motion was normal; there were no regional wall   motion abnormalities. - Aortic valve: There was mild regurgitation. - Atrial septum: No defect or patent foramen ovale was identified. - Pulmonary arteries: PA peak pressure: 32 mm Hg (S).  Mr Brain Wo Contrast 11/27/2017 IMPRESSION: 1. Small acute infarcts scattered in the left corona radiata, some tracking toward the posterior left lentiform. No associated acute hemorrhage or mass effect. 2. Underlying severe chronic ischemic disease in the bilateral deep gray matter nuclei with advanced chronic signal changes in the cerebral white matter, and brainstem Wallerian degeneration. 3. Intubated. Right nasoenteric tube in place. Fluid in the pharynx.    PHYSICAL EXAM  Temp:  [98.8 F (37.1 C)-100 F (37.8 C)] 99.3 F (37.4 C) (01/20 0800) Pulse Rate:  [84-112] 87 (01/20 1123) Resp:  [17-23] 18 (01/20 1123) BP: (129-173)/(86-134) 143/98 (01/20 1123) SpO2:  [98 %-100 %] 100 % (01/20 1123) FiO2 (%):  [30 %-40 %] 40 % (01/20 1123) Weight:  [175 lb 7.8 oz (79.6 kg)] 175 lb 7.8 oz (79.6 kg) (01/20 0400)  General - Well nourished, well developed, intubated off sedation.  Ophthalmologic - Fundi not visualized due to small pupils.  Cardiovascular - Regular rate and rhythm.  Neuro - intubated off sedation, unresponsive. Partially opens eyes to sternal rub. Will not follow any commands. Eyes are closed. Bilateral small pupils, positive corneals bilaterally, positive gag and cough. On pain stimulation, minimum withdrawal response in the left upper, left lower and right lower extremiity DTR normal on the right, but significantly increased on the left, babinski positive on the left. Still has significant spasticity  at LUE and LLE. Sensation, coordination and gait not tested.   ASSESSMENT/PLAN Mr. FINIS HENDRICKSEN is a 57 y.o. 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 asarthria. He did not receive IV t-PA due to initial improvement in deficits and late presentation.   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  MRI head - left BG and CR 2 small infarcts  CTA head and neck - right M1, M2, and left M2 stenosis  2D Echo - EF 60-65%  LDL - 251  HgbA1c - 5.7  UDS - negative   VTE prophylaxis - Lovenox Diet NPO time specified  aspirin 81 mg daily prior to admission, now on ASA 374m. Hold off plavix for possible trach and PEG procedure  Patient will be counseled to be compliant with his antithrombotic medications  Ongoing aggressive stroke risk factor management  Therapy recommendations:  pending  Disposition:  Pending  Respiratory distress  Due to pseudobulbar etiology  Intubated now  CCM on board  Off zosyn and vanco   Discussed with wife and Dr. JCarson Myrtle will do extubation trial today, if failed will re-intubate and consider trach next week  Asarthria and dysphagia   Pseudobulbar etiology with b/l involvement  On TF  May need PEG in the future if failed extubation  AKI, improving  Cre 1.30->1.97->2.37->2.38->1.78->1.79  Likely due to dehydration due to perfuse sweating and lack of po fluid  On IVF @ 60  On TF @45   Close monitoring  History of stroke  First stroke in 1999 as per wife  06/2004 CT head showed old right BG encephalomalacia  12/2006 left BG ICH, MRA h/n  unremarkable  79/2426- left PLIC and CR infarct   Hypertension BP high today, may related to agitation on vent On norvasc and lisinopril Increase lisinopril to 222mbid  Labetalol PRN Long-term BP goal normotensive  Hyperlipidemia  Home meds: lipitor 20.  LDL 251, goal < 70  Now on Lipitor 80 mg daily  Continue statin at discharge  Other Stroke Risk Factors  Former cigarette smoker - quit  ETOH use, advised to drink no more than 1 drink per day.  Hx of substance abuse  Other Active Problems  Hypokalemia - resolved after supplement  History of cocaine abuse - UDS negative   Hospital day # 7  The patient remains quite unresponsive and his neurological exam seems out of proportion to his brain imaging findings. Plan check EEG for seizure activity. Prognosis is quite guarded.. Discussed with Dr. Dorthula Perfect critical care medicine . This patient is critically ill due to respiratory distress, stroke, dysphagia, asarthria and at significant risk of neurological worsening, death form recurrent stroke, seizure, aspiration pneumonia, heart failure, renal failure. This patient's care requires constant monitoring of vital signs, hemodynamics, respiratory and cardiac monitoring, review of multiple databases, neurological assessment, discussion with family, other specialists and medical decision making of high complexity. I spent 35 minutes of neurocritical care time in the care of this patient.  Antony Contras, MD Stroke Neurology 12/03/2017 11:45 AM   To contact Stroke Continuity provider, please refer to [email or web address] After hours, contact General Neurology

## 2017-12-04 DIAGNOSIS — I63039 Cerebral infarction due to thrombosis of unspecified carotid artery: Secondary | ICD-10-CM

## 2017-12-04 LAB — RENAL FUNCTION PANEL
ALBUMIN: 2.6 g/dL — AB (ref 3.5–5.0)
ANION GAP: 12 (ref 5–15)
BUN: 39 mg/dL — ABNORMAL HIGH (ref 6–20)
CHLORIDE: 117 mmol/L — AB (ref 101–111)
CO2: 24 mmol/L (ref 22–32)
Calcium: 8.8 mg/dL — ABNORMAL LOW (ref 8.9–10.3)
Creatinine, Ser: 1.72 mg/dL — ABNORMAL HIGH (ref 0.61–1.24)
GFR calc Af Amer: 50 mL/min — ABNORMAL LOW (ref >60)
GFR calc non Af Amer: 43 mL/min — ABNORMAL LOW (ref >60)
GLUCOSE: 125 mg/dL — AB (ref 65–99)
PHOSPHORUS: 2.7 mg/dL (ref 2.5–4.6)
POTASSIUM: 3.6 mmol/L (ref 3.5–5.1)
Sodium: 153 mmol/L — ABNORMAL HIGH (ref 135–145)

## 2017-12-04 LAB — GLUCOSE, CAPILLARY
GLUCOSE-CAPILLARY: 116 mg/dL — AB (ref 65–99)
GLUCOSE-CAPILLARY: 122 mg/dL — AB (ref 65–99)
GLUCOSE-CAPILLARY: 162 mg/dL — AB (ref 65–99)
Glucose-Capillary: 111 mg/dL — ABNORMAL HIGH (ref 65–99)
Glucose-Capillary: 102 mg/dL — ABNORMAL HIGH (ref 65–99)
Glucose-Capillary: 136 mg/dL — ABNORMAL HIGH (ref 65–99)
Glucose-Capillary: 141 mg/dL — ABNORMAL HIGH (ref 65–99)

## 2017-12-04 MED ORDER — HEPARIN SODIUM (PORCINE) 5000 UNIT/ML IJ SOLN
5000.0000 [IU] | Freq: Three times a day (TID) | INTRAMUSCULAR | Status: DC
  Administered 2017-12-04 – 2017-12-22 (×55): 5000 [IU] via SUBCUTANEOUS
  Filled 2017-12-04 (×53): qty 1

## 2017-12-04 MED ORDER — DEXAMETHASONE SODIUM PHOSPHATE 10 MG/ML IJ SOLN
6.0000 mg | Freq: Four times a day (QID) | INTRAMUSCULAR | Status: AC
  Administered 2017-12-04 – 2017-12-05 (×8): 6 mg via INTRAVENOUS
  Filled 2017-12-04 (×8): qty 1

## 2017-12-04 MED ORDER — POTASSIUM CHLORIDE 20 MEQ/15ML (10%) PO SOLN
20.0000 meq | ORAL | Status: AC
  Administered 2017-12-04 (×2): 20 meq
  Filled 2017-12-04 (×2): qty 15

## 2017-12-04 MED ORDER — INSULIN ASPART 100 UNIT/ML ~~LOC~~ SOLN
0.0000 [IU] | Freq: Three times a day (TID) | SUBCUTANEOUS | Status: DC
  Administered 2017-12-04: 2 [IU] via SUBCUTANEOUS
  Administered 2017-12-04: 1 [IU] via SUBCUTANEOUS
  Administered 2017-12-05 (×3): 2 [IU] via SUBCUTANEOUS
  Administered 2017-12-06: 1 [IU] via SUBCUTANEOUS
  Administered 2017-12-06: 2 [IU] via SUBCUTANEOUS

## 2017-12-04 NOTE — Progress Notes (Signed)
[facility] ADULT ICU REPLACEMENT PROTOCOL FOR AM LAB REPLACEMENT ONLY  The patient does apply for the [facility] Adult ICU Electrolyte Replacment Protocol based on the criteria listed below:   1. Is GFR >/= 40 ml/min? Yes.    Patient's GFR today is 43 2. Is urine output >/= 0.5 ml/kg/hr for the last 6 hours? Yes.   Patient's UOP is 0.7 ml/kg/hr 3. Is BUN < 60 mg/dL? Yes.    Patient's BUN today is 39  4. Abnormal electrolyte(s): K 3.6 5. Ordered repletion with: protocol 6. If a panic level lab has been reported, has the CCM MD in charge been notified? No..   Physician:    Carlisle Beers 12/04/2017 6:46 AM

## 2017-12-04 NOTE — Progress Notes (Signed)
STROKE TEAM PROGRESS NOTE   SUBJECTIVE (INTERVAL HISTORY) His wife is not at bedside. Pt  remains quite unresponsive. After propofol is stopped he barely opens eyes but does not follow any commands. He has left gaze deviation. Repeat CT scan of the head this morning shows stable appearance of his subcortical infarcts without any worsening or acute change  Past Medical History:  Diagnosis Date  . Arthritis   . Chest pain    with a negative adenosine cardiolite in December 2006. Dr. Liana Crocker   . Cocaine abuse ([place])    last used in 2000; currently in remission; no IVDU to my knowledge  . Diastolic dysfunction    impaired L ventricle relatation with an EF of 65% and echo in December 2005  . Erectile dysfunction   . Hyperlipidemia    elevated transaminases on statin?-40's (2/06) - Hep B/C negative 4/06; resolution on 4/06 labs; off statin in 2006  . Hypertension    concentric LVH 2d echo 12/05; negative proteinuria 11/00  . 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 Onychomycosis April 2003   right big toe  . Prostate disorder    prostate irregularity with obstructive voiding symptoms (PSA 0.67); -likely related to overstimulation of alpha receptors at bladder neck; seen by Dr. Reece Agar in 9/05. no BPH at that visit  . Tinea versicolor    dermatology referral; secondary to no treatement response to selenium sulfide; currently resolive    HOME MEDICATIONS:  Current Meds  Medication Sig  . amLODipine (NORVASC) 10 MG tablet Take 1 tablet (10 mg total) by mouth every morning.  Marland Kitchen aspirin 81 MG tablet Take 1 tablet (81 mg total) by mouth daily.  . hydrochlorothiazide (HYDRODIURIL) 25 MG tablet Take  1 tablet (25 mg total) by mouth every morning.  Marland Kitchen lisinopril (PRINIVIL,ZESTRIL) 20 MG tablet Take 1 tablet (20 mg total) by mouth daily.  . Multiple Vitamins-Minerals (HM MULTIVITAMIN ADULT GUMMY PO) Take 1 tablet by mouth every morning.     HOSPITAL MEDICATIONS:  . amLODipine  10 mg Per Tube q morning - 10a  . aspirin  325 mg Per Tube Daily  . atorvastatin  80 mg Per Tube q1800  . chlorhexidine gluconate (MEDLINE KIT)  15 mL Mouth Rinse BID  . dexamethasone  6 mg Intravenous Q6H  . docusate  100 mg Per Tube BID  . heparin injection (subcutaneous)  5,000 Units Subcutaneous Q8H  . insulin aspart  0-9 Units Subcutaneous TID WC  . lisinopril  20 mg Per Tube BID  . mouth rinse  15 mL Mouth Rinse 10 times per day  . sennosides  5 mL Per Tube BID    OBJECTIVE Temp:  [98.6 F (37 C)-99.7 F (37.6 C)] 99.4 F (37.4 C) (01/21 1200) Pulse Rate:  [67-112] 91 (01/21 1300) Cardiac Rhythm: Normal sinus rhythm (01/21 0800) Resp:  [14-25] 22 (01/21 1300) BP: (127-183)/(83-119) 146/100 (01/21 1300) SpO2:  [99 %-100 %] 100 % (01/21 1300) FiO2 (%):  [30 %-40 %] 30 % (01/21 1111) Weight:  [171 lb 1.2 oz (77.6 kg)] 171 lb 1.2 oz (77.6 kg) (01/21 0438)  CBC:  Recent Labs  Lab 12/02/17 0500 12/03/17 0629  WBC 5.6 6.6  NEUTROABS 5.0 4.6  HGB 11.6*  10.5*  HCT 36.1* 33.2*  MCV 72.3* 72.0*  PLT 205 283    Basic Metabolic Panel:  Recent Labs  Lab 11/28/17 0444  12/03/17 0629 12/04/17 0336  NA 141   < > 151* 153*  K 3.7   < > 3.8 3.6  CL 111   < > 117* 117*  CO2 21*   < > 24 24  GLUCOSE 125*   < > 131* 125*  BUN 21*   < > 45* 39*  CREATININE 2.38*   < > 1.84* 1.72*  CALCIUM 8.7*   < > 8.4* 8.8*  MG 2.1  --   --   --   PHOS 3.2   < > 2.7 2.7   < > = values in this interval not displayed.    Lipid Panel:     Component Value Date/Time   CHOL 322 (H) 11/26/2017 0735   CHOL 295 (H) 05/29/2017 1537   TRIG 55 12/02/2017 1950   HDL 57 11/26/2017 0735   HDL 60 05/29/2017 1537    CHOLHDL 5.6 11/26/2017 0735   VLDL 14 11/26/2017 0735   LDLCALC 251 (H) 11/26/2017 0735   LDLCALC 224 (H) 05/29/2017 1537   HgbA1c:  Lab Results  Component Value Date   HGBA1C 5.7 (H) 11/26/2017   Urine Drug Screen:     Component Value Date/Time   LABOPIA NONE DETECTED 11/26/2017 0047   COCAINSCRNUR NONE DETECTED 11/26/2017 0047   LABBENZ NONE DETECTED 11/26/2017 0047   AMPHETMU NONE DETECTED 11/26/2017 0047   THCU NONE DETECTED 11/26/2017 0047   LABBARB NONE DETECTED 11/26/2017 0047    Alcohol Level     Component Value Date/Time   ETH <11 06/07/2013 1731    IMAGING I have personally reviewed the radiological images below and agree with the radiology interpretations.  CTA NECK:  1. No hemodynamically significant stenosis or acute vascular process in the neck.  2. 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 No pseudoaneurysm.  3. An incidental finding of potential clinical significance has been found.  Ectatic 3.6 cm ascending aorta.  Recommend annual imaging followup by CTA or MRA. This recommendation follows 2010 ACCF/AHA/AATS/ACR/ASA/SCA/SCAI/SIR/STS/SVM Guidelines for the Diagnosis and Management of Patients with Thoracic Aortic Disease. Circulation.2010; 121: M629-U765  CTA HEAD:  1. No emergent large vessel occlusion. Focally occluded RIGHT M2/M3 segment.  2. Advanced intracranial atherosclerosis with multifocal high-grade stenosis including severe stenosis LEFT M2 segment, moderate to severe stenoses RIGHT middle cerebral artery and LEFT posterior cerebral artery.  3. 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   CT PERFUSION:  1. No acute perfusion defect.  2. Prolonged mean transit time RIGHT MCA consistent with stenoses.   CT Head Wo Contrast 11/25/2017 IMPRESSION: 1. Potentially acute LEFT corona radiata/basal ganglia infarct. 2. ASPECTS is 9. 3. Old bilateral basal ganglia infarcts. 4. Moderate to  severe white matter changes compatible chronic small vessel ischemic disease. 5. Mild global parenchymal brain volume loss for age.  TTE - Left ventricle: The cavity size was normal. Systolic function was   normal. The estimated ejection fraction was in the range of 60%   to 65%. Wall motion was normal; there were no regional wall   motion abnormalities. - Aortic valve: There was mild regurgitation. - Atrial septum: No defect or patent foramen ovale was identified. - Pulmonary arteries: PA peak pressure: 32 mm Hg (S).  Mr Brain Wo Contrast 11/27/2017 IMPRESSION: 1. Small acute infarcts scattered in the left corona radiata,  some tracking toward the posterior left lentiform. No associated acute hemorrhage or mass effect. 2. Underlying severe chronic ischemic disease in the bilateral deep gray matter nuclei with advanced chronic signal changes in the cerebral white matter, and brainstem Wallerian degeneration. 3. Intubated. Right nasoenteric tube in place. Fluid in the pharynx.    PHYSICAL EXAM  Temp:  [98.6 F (37 C)-99.7 F (37.6 C)] 99.4 F (37.4 C) (01/21 1200) Pulse Rate:  [67-112] 91 (01/21 1300) Resp:  [14-25] 22 (01/21 1300) BP: (127-183)/(83-119) 146/100 (01/21 1300) SpO2:  [99 %-100 %] 100 % (01/21 1300) FiO2 (%):  [30 %-40 %] 30 % (01/21 1111) Weight:  [171 lb 1.2 oz (77.6 kg)] 171 lb 1.2 oz (77.6 kg) (01/21 0438)  General - Well nourished, well developed, intubated off sedation.  Ophthalmologic - Fundi not visualized due to small pupils.  Cardiovascular - Regular rate and rhythm.  Neuro - intubated off sedation, awake  with eyes open Will  follow   commands in all 4 extremities. Eyes are closed. Bilateral small pupils, positive corneals bilaterally, positive gag and cough.  Tone is increased in all extremities left more than right DTR normal on the right, but significantly increased on the left, babinski positive on the left. Still has significant spasticity at LUE and  LLE. Sensation, coordination and gait not tested.   ASSESSMENT/PLAN [name] is a 57 y.o. 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 asarthria. He did not receive IV t-PA due to initial improvement in deficits and late presentation.   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  MRI head - left BG and CR 2 small infarcts  CTA head and neck - right M1, M2, and left M2 stenosis  2D Echo - EF 60-65%  LDL - 251  HgbA1c - 5.7  UDS - negative   VTE prophylaxis - Lovenox Fall precautions Diet NPO time specified  aspirin 81 mg daily prior to admission, now on ASA 310m. Hold off plavix for possible trach and PEG procedure  Patient will be counseled to be compliant with his antithrombotic medications  Ongoing aggressive stroke risk factor management  Therapy recommendations:  pending  Disposition:  Pending  Respiratory distress  Due to pseudobulbar etiology  Intubated now  CCM on board  Off zosyn and vanco   Discussed with wife and Dr. JCarson Myrtle will do extubation trial today, if failed will re-intubate and consider trach next week  Asarthria and dysphagia   Pseudobulbar etiology with b/l involvement  On TF  May need PEG in the future if failed extubation  AKI, improving  Cre 1.30->1.97->2.37->2.38->1.78->1.79  Likely due to dehydration due to perfuse sweating and lack of po fluid  On IVF @ 60  On TF _0   Close monitoring  History of stroke  First stroke in 1999 as per wife  06/2004 CT head showed old right BG encephalomalacia  12/2006 left BG ICH, MRA h/n  unremarkable  75/2841- left PLIC and CR infarct   Hypertension BP high today, may related to agitation on vent On norvasc and lisinopril Increase lisinopril to 24mbid Labetalol PRN Long-term BP goal normotensive  Hyperlipidemia  Home meds: lipitor 20.  LDL 251,  goal < 70  Now on Lipitor 80 mg daily  Continue statin at discharge  Other Stroke Risk Factors  Former cigarette smoker - quit  ETOH use, advised to drink no more than 1 drink per day.  Hx of substance abuse  Other Active Problems  Hypokalemia - resolved after supplement  History of cocaine abuse - UDS negative   Hospital day # 8  The patient remains quite unresponsive and his neurological exam seems out of proportion to his brain imaging findings.  Patient is making slow neurological improvement but it is unclear whether he will be able to protect his airway and may likely need tracheostomy for airway protection Discussed with patient's wife at the bedside.. Discussed with Dr. Pearline Cables critical care medicine . This patient is critically ill due to respiratory distress, stroke, dysphagia,  and at significant risk of neurological worsening, death form recurrent stroke, seizure, aspiration pneumonia, heart failure, renal failure. This patient's care requires constant monitoring of vital signs, hemodynamics, respiratory and cardiac monitoring, review of multiple databases, neurological assessment, discussion with family, other specialists and medical decision making of high complexity. I spent 32 minutes of neurocritical care time in the care of this patient.  Antony Contras, MD Stroke Neurology 12/04/2017 1:43 PM   To contact Stroke Continuity provider, please refer to [email or web address] After hours, contact General Neurology

## 2017-12-04 NOTE — Evaluation (Signed)
Occupational Therapy Evaluation Patient Details Name: [name] MRN: [id number] DOB: June 11, 1961 Today's Date: 12/04/2017    History of Present Illness 57 y.o.malewith hx of previous strokes, polysubstance abuse, HLD, HTN, right basal ganglia hemorrhage in 1999, and left basal ganglia hemorrhage 2005, presenting with episodicaphasia vs. asarthria.Hedid not receive IV t-PA due to initial improvement in deficits and late presentation. 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 Patient developed anarthria, severe dysphagia and was intubated on 1/13.   Clinical Impression   Pt supine in bed upon arrival. No family present to provide PLOF and home set up. Pt requiring Total A for ADLs and bed mobility. Pt presenting with flexion synergy pattern in LUE, decreased functional use of RUE, inattention to right side with L gaze and head turn, and poor cognition as seen with requiring significant amount of time to follow simple commands (i.e. Squeeze hand). Pt will require acute OT to increase occupational performance and participation. Recommend dc to CIR to optimize independence and safety with ADLs and functional mobility; pt may need SNF level if arousal and awareness does not increase.     Follow Up Recommendations  Supervision/Assistance - 24 hour;CIR    Equipment Recommendations  Other (comment)(Defer to next venue)    Recommendations for Other Services PT consult;Speech consult     Precautions / Restrictions Precautions Precautions: Fall Precaution Comments: intubated on vent Restrictions Weight Bearing Restrictions: No      Mobility Bed Mobility Overal bed mobility: Needs Assistance Bed Mobility: Supine to Sit;Sit to Supine     Supine to sit: +2 for safety/equipment;+2 for physical assistance;Total assist Sit to supine: +2 for safety/equipment;+2 for physical assistance;Total assist   General bed  mobility comments: No evidence of initiation of bed mobility at this time, +2 total assist helicopter technique to EOB and return  Transfers                 General transfer comment: unable to perform at this time    Balance Overall balance assessment: Needs assistance Sitting-balance support: Feet supported;Single extremity supported Sitting balance-Leahy Scale: Zero Sitting balance - Comments: unable to maintain sitting balance without maximal physical assist/ no evidence of trunk initiation or righting response Postural control: Posterior lean(extensor posture noted)                                 ADL either performed or assessed with clinical judgement   ADL Overall ADL's : Needs assistance/impaired                                       General ADL Comments: Total A for ADLs. Pt attempted to perform grooming task. Required Max A with hand over hand support at elbow and wrist to wash face. Max A to maintain sitting at [employer]         Perception     Praxis      Pertinent Vitals/Pain Pain Assessment: Faces Faces Pain Scale: No hurt Pain Intervention(s): Monitored during session     Hand Dominance Right   Extremity/Trunk Assessment Upper Extremity Assessment Upper Extremity Assessment: LUE deficits/detail;RUE deficits/detail;Difficult to assess due to impaired cognition RUE Deficits / Details: Decreased grasp strength and poor ROM. Pt unable to bring hand to mouth without assistance. RUE Coordination:  decreased fine motor;decreased gross motor LUE Deficits / Details: LUE flexor tone with synergy. Able to initate grasp. able to PROM into compostie extension LUE Coordination: decreased fine motor;decreased gross motor   Lower Extremity Assessment Lower Extremity Assessment: Defer to PT evaluation LLE Deficits / Details: noted LLE extensor tone with syngergy, more rigidity on reevaluation LLE Coordination: decreased fine  motor;decreased gross motor   Cervical / Trunk Assessment Cervical / Trunk Assessment: Other exceptions Cervical / Trunk Exceptions: Poor trunk control   Communication Communication Communication: (currently intubated)   Cognition Arousal/Alertness: Lethargic(but arousable EOB) Behavior During Therapy: Flat affect Overall Cognitive Status: Difficult to assess Area of Impairment: Following commands;Attention                   Current Attention Level: Focused   Following Commands: (not following commands at this time)       General Comments: Patient arousable at EOB noted inattention/neglect to R side   General Comments       Exercises     Shoulder Instructions      Home Living Family/patient expects to be discharged to:: Private residence Living Arrangements: Spouse/significant other Available Help at Discharge: Available PRN/intermittently Type of Home: House                       Home Equipment: Walker - 2 wheels   Additional Comments: patient unable to verbally communicate at this time, provided some brief history but unable to completely obtain      Prior Functioning/Environment Level of Independence: Needs assistance  Gait / Transfers Assistance Needed: was using a Rw to mobilize since previous stroke     Comments: Pt unable to provide PLOF and family not present at time of eval        OT Problem List: Decreased strength;Decreased range of motion;Decreased activity tolerance;Impaired balance (sitting and/or standing);Decreased cognition;Decreased safety awareness;Decreased knowledge of use of DME or AE;Decreased knowledge of precautions;Impaired UE functional use      OT Treatment/Interventions: Self-care/ADL training;Therapeutic exercise;Energy conservation;DME and/or AE instruction;Therapeutic activities;Patient/family education    OT Goals(Current goals can be found in the care plan section) Acute Rehab OT Goals Patient Stated Goal:  none stated OT Goal Formulation: Patient unable to participate in goal setting Time For Goal Achievement: 12/18/17 Potential to Achieve Goals: Good ADL Goals Pt Will Perform Grooming: with mod assist;sitting(supported sitting) Pt Will Perform Upper Body Dressing: with mod assist;sitting(supportive sitting) Additional ADL Goal #1: Pt will perform bed mobility with Mod A +2 in prepration for ADLs Additional ADL Goal #2: Pt will attend to objects on right side 50% of time during ADLs with Mod cues  OT Frequency: Min 2X/week   Barriers to D/C:            Co-evaluation PT/OT/SLP Co-Evaluation/Treatment: Yes Reason for Co-Treatment: Complexity of the patient's impairments (multi-system involvement);For patient/therapist safety PT goals addressed during session: Mobility/safety with mobility OT goals addressed during session: ADL's and self-care      AM-PAC PT "6 Clicks" Daily Activity     Outcome Measure Help from another person eating meals?: Total Help from another person taking care of personal grooming?: Total Help from another person toileting, which includes using toliet, bedpan, or urinal?: Total Help from another person bathing (including washing, rinsing, drying)?: Total Help from another person to put on and taking off regular upper body clothing?: Total Help from another person to put on and taking off regular lower body clothing?: Total 6 Click Score:  6   End of Session Equipment Utilized During Treatment: Oxygen Nurse Communication: Mobility status  Activity Tolerance: Patient limited by lethargy;Patient limited by fatigue Patient left: in bed;with call [place]/phone within reach;with bed alarm set;with SCD's reapplied  OT Visit Diagnosis: Muscle weakness (generalized) (M62.81);Other symptoms and signs involving cognitive function;Hemiplegia and hemiparesis Hemiplegia - Right/Left: Right Hemiplegia - dominant/non-dominant: (Unknown) Hemiplegia - caused by: Cerebral  infarction                Time: 9702-6378 OT Time Calculation (min): 24 min Charges:  OT General Charges $OT Visit: 1 Visit OT Evaluation $OT Eval High Complexity: 1 High G-Codes:     Herma Uballe MSOT, OTR/L Acute Rehab Pager: [phone] Office: [place] 12/04/2017, 3:39 PM

## 2017-12-04 NOTE — Progress Notes (Signed)
PULMONARY / CRITICAL CARE MEDICINE   Name: [name] MRN: [id number] DOB: May 23, 1961    ADMISSION DATE:  11/25/2017  CHIEF COMPLAINT: Altered mental status requiring intubation and mechanical ventilation  HISTORY OF PRESENT ILLNESS:   This is a 57 year old with a distant prior history of CVA related to cocaine use who presented on 1/13 with difficulty with speech.  He subsequently had difficulty managing his secretions and required intubation on the day of admission.  CT scan showed stenosis of M1 and M2 segments on the left and there was infarction in the coronal radiata.  He was extubated on 1/19 but developed stridor and required reintubation.  He is currently intubated with a 6.5 mm endotracheal tube.  He is not sedated at the time of my examination and is very inconsistent and slow in responding to any instructions.  I have spoken with his wife who tells me that he is drooling.  Prior to his current CVA his wife tells me that he was active and participated in normal activities despite his prior CVA.   PAST MEDICAL HISTORY :  He  has a past medical history of Arthritis, Chest pain, Cocaine abuse ([place]), Diastolic dysfunction, Erectile dysfunction, Hyperlipidemia, Hypertension, Intracranial hemorrhage ([place]), Onychomycosis (April 2003), Prostate disorder, and Tinea versicolor.  PAST SURGICAL HISTORY: He  has a past surgical history that includes Colonoscopy with propofol (N/A, 05/22/2013).  No Known Allergies  No current facility-administered medications on file prior to encounter.    Current Outpatient Medications on File Prior to Encounter  Medication Sig  . amLODipine (NORVASC) 10 MG tablet Take 1 tablet (10 mg total) by mouth every morning.  Marland Kitchen aspirin 81 MG tablet Take 1 tablet (81 mg total) by mouth daily.  . hydrochlorothiazide (HYDRODIURIL) 25 MG tablet Take 1 tablet (25 mg total) by mouth every morning.  Marland Kitchen lisinopril (PRINIVIL,ZESTRIL) 20 MG tablet Take 1 tablet (20 mg total) by  mouth daily.  . Multiple Vitamins-Minerals (HM MULTIVITAMIN ADULT GUMMY PO) Take 1 tablet by mouth every morning.  Marland Kitchen atorvastatin (LIPITOR) 20 MG tablet Take 1 tablet (20 mg total) by mouth daily. (Patient not taking: Reported on 11/25/2017)    FAMILY HISTORY:  His indicated that his mother is alive. He indicated that his father is deceased.   SOCIAL HISTORY: He  reports that  has never smoked. he has never used smokeless tobacco. He reports that he drinks alcohol. He reports that he does not use drugs.  REVIEW OF SYSTEMS:   Not obtainable  SUBJECTIVE:  As above  VITAL SIGNS: BP (!) 147/104   Pulse 89   Temp 98.6 F (37 C) (Axillary)   Resp 14   Ht 5\' 7"  (1.702 m)   Wt 171 lb 1.2 oz (77.6 kg)   SpO2 100%   BMI 26.79 kg/m   HEMODYNAMICS:    VENTILATOR SETTINGS: Vent Mode: PRVC FiO2 (%):  [40 %] 40 % Set Rate:  [18 bmp] 18 bmp Vt Set:  [530 mL] 530 mL PEEP:  [5 cmH20] 5 cmH20 Pressure Support:  [5 cmH20] 5 cmH20 Plateau Pressure:  [15 cmH20-18 cmH20] 15 cmH20  INTAKE / OUTPUT: I/O last 3 completed shifts: In: 4342.3 [I.V.:2182.3; NG/GT:2160] Out: 2925 [Urine:2925]  PHYSICAL EXAMINATION: General: He is orally intubated and mechanically ventilated he is in no distress.  He was on no sedation at the time of my examination Neuro: He very slowly and intermittently attempts to respond to instructions.  He does close eyes on request and he does  move both upper extremities.  Pupils appear to be equal, he resists eye opening and is difficult to judge EOMs. Cardiovascular: S1 and S2 are regular without murmur rub or gallop Lungs: He is intubated with a 6.5 endotracheal tube, he is unable to move any air around the tube when the cuff is deflated.  He does not breathe above the set ventilator rate.  There is symmetric air movement, no wheezes, no rales. Abdomen: The abdomen is soft without any organomegaly masses tenderness guarding or rebound.  He is  anicteric   LABS:  BMET Recent Labs  Lab 12/02/17 0500 12/03/17 0629 12/04/17 0336  NA 149* 151* 153*  K 4.1 3.8 3.6  CL 113* 117* 117*  CO2 23 24 24   BUN 29* 45* 39*  CREATININE 2.05* 1.84* 1.72*  GLUCOSE 120* 131* 125*    Electrolytes Recent Labs  Lab 11/28/17 0444  12/02/17 0500 12/03/17 0629 12/04/17 0336  CALCIUM 8.7*   < > 8.9 8.4* 8.8*  MG 2.1  --   --   --   --   PHOS 3.2   < > 3.8 2.7 2.7   < > = values in this interval not displayed.    CBC Recent Labs  Lab 12/01/17 0837 12/02/17 0500 12/03/17 0629  WBC 4.6 5.6 6.6  HGB 11.3* 11.6* 10.5*  HCT 36.0* 36.1* 33.2*  PLT 201 205 208    Coag's No results for input(s): APTT, INR in the last 168 hours.  Sepsis Markers No results for input(s): LATICACIDVEN, PROCALCITON, O2SATVEN in the last 168 hours.  ABG Recent Labs  Lab 11/30/17 0340 12/02/17 0131 12/02/17 0500  PHART 7.418 7.334* 7.445  PCO2ART 44.0 58.4* 39.3  PO2ART 129* 453.0* 103    Liver Enzymes Recent Labs  Lab 11/28/17 0444  12/02/17 0500 12/03/17 0629 12/04/17 0336  AST 44*  --   --   --   --   ALT 23  --   --   --   --   ALKPHOS 74  --   --   --   --   BILITOT 1.4*  --   --   --   --   ALBUMIN 2.7*   < > 2.8* 2.5* 2.6*   < > = values in this interval not displayed.    Cardiac Enzymes No results for input(s): TROPONINI, PROBNP in the last 168 hours.  Glucose Recent Labs  Lab 12/03/17 1211 12/03/17 1524 12/03/17 1913 12/04/17 0011 12/04/17 0434 12/04/17 0733  GLUCAP 138* 116* 119* 116* 111* 102*    Imaging No results found.     DISCUSSION:     Left corona radiata infarction superimposed on prior infarctions with residual deficits which appear to be in excess of findings on imaging studies.  An EEG has not shown evidence of seizure activity.  In addition he has airway compromise and cannot move air around the endotracheal tube today.  ASSESSMENT / PLAN:  PULMONARY A: His most recent x-ray was on 120 and  did not show significant parenchymal disease.  She does have a history of diastolic dysfunction but I was not impressed by the presence of CHF either.  He is currently intubated for airway protection.  He had stridor after extubation on 1/19 and I am going to be introducing a dose of Decadron to treat for airway edema not as a CNS agent.  Blood sugars will be monitored when he is on the Decadron.  We will reevaluate a leak test  on 1/22 and if he is unable to move air around the endotracheal tube I will encourage tracheostomy.  CARDIOVASCULAR A: Continues Lipitor and aspirin for his stroke.  He is on Norvasc and lisinopril for blood pressure control.  GASTROINTESTINAL A: GI prophylaxis is with Pepcid.  NEUROLOGIC A: Neurologic status appears to be somewhat worse than accounted for by the findings on imaging studies.  I am going to be obtaining thyroid studies and liver function studies looking for metabolic provocations for decreased sensorium.  An ionized calcium has also been ordered.  EEG did not suggest seizure activity.  FAMILY  I discussed the potential indications for tracheostomy and PEG tube placement with his wife today.  35 minutes was spent in the care of this patient today  Oneita Hurt [facility] and [place] Pager: [phone]  12/04/2017, 8:10 AM

## 2017-12-04 NOTE — Evaluation (Addendum)
Physical Therapy Evaluation Patient Details Name: [name] MRN: [id number] DOB: [birth year] Today's Date: 12/04/2017   History of Present Illness  57 y.o.malewith hx of previous strokes, polysubstance abuse, HLD, HTN, right basal ganglia hemorrhage in 1999, and left basal ganglia hemorrhage 2005, presenting with episodicaphasia vs. asarthria.Hedid not receive IV t-PA due to initial improvement in deficits and late presentation. 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 Patient developed anarthria, severe dysphagia and was intubated on 1/13.  Clinical Impression  Patient seen in conjunction with OT therapist for EOB activity. Patient VSS throughout on vent support (full). Tolerated EOB ~10 minutes but unable to follow commands at this time. Noted right inattention vs neglect. Left UE and LE remains rigid with tone. Recommend PRAFO boots for bilateral LE preservation at this time. Given current deficits feel patient will need post acute rehabilitation CIR vs SNF pending activity tolerance and ability to engage in therapies. Will continue to see and progress as tolerated.    Follow Up Recommendations CIR(if improved ability to participate otherwise may need SNF)    Equipment Recommendations  (tbd)    Recommendations for Other Services Rehab consult     Precautions / Restrictions Precautions Precautions: Fall Precaution Comments: intubated on vent      Mobility  Bed Mobility Overal bed mobility: Needs Assistance Bed Mobility: Supine to Sit;Sit to Supine     Supine to sit: +2 for safety/equipment;+2 for physical assistance;Total assist Sit to supine: +2 for safety/equipment;+2 for physical assistance;Total assist   General bed mobility comments: No evidence of initiation of bed mobility at this time, +2 total assist helicopter technique to EOB and return  Transfers                 General  transfer comment: unable to perform at this time  Ambulation/Gait             General Gait Details: unable to perform at this time  Stairs            Wheelchair Mobility    Modified Rankin (Stroke Patients Only) Modified Rankin (Stroke Patients Only) Pre-Morbid Rankin Score: Moderate disability Modified Rankin: Severe disability     Balance Overall balance assessment: Needs assistance Sitting-balance support: Feet supported;Single extremity supported Sitting balance-Leahy Scale: Zero Sitting balance - Comments: unable to maintain sitting balance without maximal physical assist/ no evidence of trunk initiation or righting response Postural control: Posterior lean(extensor posture noted)                                   Pertinent Vitals/Pain Pain Assessment: Faces Faces Pain Scale: No hurt    Home Living Family/patient expects to be discharged to:: Private residence Living Arrangements: Spouse/significant other Available Help at Discharge: Available PRN/intermittently Type of Home: House         Home Equipment: Walker - 2 wheels Additional Comments: patient unable to verbally communicate at this time, provided some brief history but unable to completely obtain    Prior Function Level of Independence: Needs assistance   Gait / Transfers Assistance Needed: was using a Rw to mobilize since previous stroke           Hand Dominance   Dominant Hand: Right    Extremity/Trunk Assessment   Upper Extremity Assessment LUE Deficits / Details: noted LUE flexor tone with synergy LUE Coordination: decreased fine motor;decreased gross motor  Lower Extremity Assessment LLE Deficits / Details: noted LLE extensor tone with syngergy, more rigidity on reevaluation LLE Coordination: decreased fine motor;decreased gross motor       Communication   Communication: (currently intubated)  Cognition Arousal/Alertness: Lethargic(but arousable  EOB) Behavior During Therapy: Flat affect Overall Cognitive Status: Difficult to assess Area of Impairment: Following commands;Attention                   Current Attention Level: Focused   Following Commands: (not following commands at this time)       General Comments: (P) Patient arousable at EOB noted inattention/neglect to R side      General Comments      Exercises     Assessment/Plan    PT Assessment Patient needs continued PT services  PT Problem List Decreased strength;Decreased activity tolerance;Decreased balance;Decreased mobility;Decreased coordination;Cardiopulmonary status limiting activity;Impaired sensation;Impaired tone       PT Treatment Interventions DME instruction;Gait training;Functional mobility training;Therapeutic activities;Therapeutic exercise;Balance training;Neuromuscular re-education;Cognitive remediation;Patient/family education    PT Goals (Current goals can be found in the Care Plan section)  Acute Rehab PT Goals Patient Stated Goal: none stated PT Goal Formulation: With patient Time For Goal Achievement: 12/10/17 Potential to Achieve Goals: Fair    Frequency Min 4X/week   Barriers to discharge        Co-evaluation PT/OT/SLP Co-Evaluation/Treatment: Yes Reason for Co-Treatment: Complexity of the patient's impairments (multi-system involvement);Necessary to address cognition/behavior during functional activity;For patient/therapist safety PT goals addressed during session: Mobility/safety with mobility         AM-PAC PT "6 Clicks" Daily Activity  Outcome Measure Difficulty turning over in bed (including adjusting bedclothes, sheets and blankets)?: Unable Difficulty moving from lying on back to sitting on the side of the bed? : Unable Difficulty sitting down on and standing up from a chair with arms (e.g., wheelchair, bedside commode, etc,.)?: Unable Help needed moving to and from a bed to chair (including a wheelchair)?:  Total Help needed walking in hospital room?: Total Help needed climbing 3-5 steps with a railing? : Total 6 Click Score: 6    End of Session Equipment Utilized During Treatment: Oxygen(full vent support) Activity Tolerance: Patient tolerated treatment well Patient left: in bed;with call [place]/phone within reach;with bed alarm set;with nursing/sitter in room Nurse Communication: Mobility status PT Visit Diagnosis: Other symptoms and signs involving the nervous system (R29.898)    Time: 8144-8185 PT Time Calculation (min) (ACUTE ONLY): 24 min   Charges:   PT Evaluation $PT Re-evaluation: 1 Re-eval     PT G Codes:        Alben Deeds, PT DPT  Board Certified Neurologic Specialist [place] 12/04/2017, 2:29 PM

## 2017-12-04 NOTE — Procedures (Signed)
Pt placed on full support during PT.  Pt now back on PS, increased to 10/5 for pt comfort.  No plan to extubate pt today due to negative cuff leak.  RT will monitor.

## 2017-12-05 DIAGNOSIS — R4182 Altered mental status, unspecified: Secondary | ICD-10-CM

## 2017-12-05 LAB — GLUCOSE, CAPILLARY
GLUCOSE-CAPILLARY: 161 mg/dL — AB (ref 65–99)
GLUCOSE-CAPILLARY: 130 mg/dL — AB (ref 65–99)
Glucose-Capillary: 134 mg/dL — ABNORMAL HIGH (ref 65–99)
Glucose-Capillary: 158 mg/dL — ABNORMAL HIGH (ref 65–99)
Glucose-Capillary: 157 mg/dL — ABNORMAL HIGH (ref 65–99)
Glucose-Capillary: 166 mg/dL — ABNORMAL HIGH (ref 65–99)

## 2017-12-05 LAB — POCT I-STAT 3, ART BLOOD GAS (G3+)
ACID-BASE EXCESS: 3 mmol/L — AB (ref 0.0–2.0)
BICARBONATE: 27.3 mmol/L (ref 20.0–28.0)
O2 Saturation: 98 %
PO2 ART: 114 mmHg — AB (ref 83.0–108.0)
TCO2: 29 mmol/L (ref 22–32)
pCO2 arterial: 41.9 mmHg (ref 32.0–48.0)
pH, Arterial: 7.423 (ref 7.350–7.450)

## 2017-12-05 LAB — COMPREHENSIVE METABOLIC PANEL
ALBUMIN: 2.9 g/dL — AB (ref 3.5–5.0)
ALT: 46 U/L (ref 17–63)
AST: 36 U/L (ref 15–41)
Alkaline Phosphatase: 82 U/L (ref 38–126)
Anion gap: 12 (ref 5–15)
BUN: 37 mg/dL — AB (ref 6–20)
CHLORIDE: 114 mmol/L — AB (ref 101–111)
CO2: 24 mmol/L (ref 22–32)
Calcium: 9.3 mg/dL (ref 8.9–10.3)
Creatinine, Ser: 1.59 mg/dL — ABNORMAL HIGH (ref 0.61–1.24)
GFR calc Af Amer: 55 mL/min — ABNORMAL LOW (ref >60)
GFR, EST NON AFRICAN AMERICAN: 47 mL/min — AB (ref >60)
GLUCOSE: 149 mg/dL — AB (ref 65–99)
Potassium: 3.9 mmol/L (ref 3.5–5.1)
SODIUM: 150 mmol/L — AB (ref 135–145)
Total Bilirubin: 0.6 mg/dL (ref 0.3–1.2)
Total Protein: 6.8 g/dL (ref 6.5–8.1)

## 2017-12-05 LAB — PROCALCITONIN

## 2017-12-05 LAB — CBC WITH DIFFERENTIAL/PLATELET
Basophils Absolute: 0 10*3/uL (ref 0.0–0.1)
Basophils Relative: 0 %
EOS ABS: 0 10*3/uL (ref 0.0–0.7)
EOS PCT: 0 %
HEMATOCRIT: 35 % — AB (ref 39.0–52.0)
Hemoglobin: 11.6 g/dL — ABNORMAL LOW (ref 13.0–17.0)
LYMPHS ABS: 0.7 10*3/uL (ref 0.7–4.0)
Lymphocytes Relative: 9 %
MCH: 23.9 pg — AB (ref 26.0–34.0)
MCHC: 33.1 g/dL (ref 30.0–36.0)
MCV: 72.2 fL — AB (ref 78.0–100.0)
MONO ABS: 0.2 10*3/uL (ref 0.1–1.0)
MONOS PCT: 3 %
Neutro Abs: 6.5 10*3/uL (ref 1.7–7.7)
Neutrophils Relative %: 88 %
PLATELETS: 232 10*3/uL (ref 150–400)
RBC: 4.85 MIL/uL (ref 4.22–5.81)
RDW: 15 % (ref 11.5–15.5)
WBC: 7.4 10*3/uL (ref 4.0–10.5)

## 2017-12-05 NOTE — Progress Notes (Signed)
STROKE TEAM PROGRESS NOTE   SUBJECTIVE (INTERVAL HISTORY) His wife is  at bedside. Pt is awake and interactive and follows commands in all 4 extremities. Critical care feels he cannot protect his airway and he is scheduled for elective tracheostomy tomorrow  Past Medical History:  Diagnosis Date  . Arthritis   . Chest pain    with a negative adenosine cardiolite in December 2006. Dr. Liana Crocker   . Cocaine abuse ([place])    last used in 2000; currently in remission; no IVDU to my knowledge  . Diastolic dysfunction    impaired L ventricle relatation with an EF of 65% and echo in December 2005  . Erectile dysfunction   . Hyperlipidemia    elevated transaminases on statin?-40's (2/06) - Hep B/C negative 4/06; resolution on 4/06 labs; off statin in 2006  . Hypertension    concentric LVH 2d echo 12/05; negative proteinuria 11/00  . 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 Onychomycosis April 2003   right big toe  . Prostate disorder    prostate irregularity with obstructive voiding symptoms (PSA 0.67); -likely related to overstimulation of alpha receptors at bladder neck; seen by Dr. Reece Agar in 9/05. no BPH at that visit  . Tinea versicolor    dermatology referral; secondary to no treatement response to selenium sulfide; currently resolive    HOME MEDICATIONS:  Current Meds  Medication Sig  . amLODipine (NORVASC) 10 MG tablet Take 1 tablet (10 mg total) by mouth every morning.  Marland Kitchen aspirin 81 MG tablet Take 1 tablet (81 mg total) by mouth daily.  . hydrochlorothiazide (HYDRODIURIL) 25 MG tablet Take 1 tablet (25 mg total) by mouth every morning.  Marland Kitchen lisinopril (PRINIVIL,ZESTRIL) 20 MG tablet Take  1 tablet (20 mg total) by mouth daily.  . Multiple Vitamins-Minerals (HM MULTIVITAMIN ADULT GUMMY PO) Take 1 tablet by mouth every morning.     HOSPITAL MEDICATIONS:  . amLODipine  10 mg Per Tube q morning - 10a  . aspirin  325 mg Per Tube Daily  . atorvastatin  80 mg Per Tube q1800  . chlorhexidine gluconate (MEDLINE KIT)  15 mL Mouth Rinse BID  . dexamethasone  6 mg Intravenous Q6H  . docusate  100 mg Per Tube BID  . heparin injection (subcutaneous)  5,000 Units Subcutaneous Q8H  . insulin aspart  0-9 Units Subcutaneous TID WC  . lisinopril  20 mg Per Tube BID  . mouth rinse  15 mL Mouth Rinse 10 times per day  . sennosides  5 mL Per Tube BID    OBJECTIVE Temp:  [98.6 F (37 C)-99.4 F (37.4 C)] 99.2 F (37.3 C) (01/22 0800) Pulse Rate:  [52-111] 99 (01/22 1200) Cardiac Rhythm: Normal sinus rhythm (01/22 0800) Resp:  [13-32] 16 (01/22 1200) BP: (134-201)/(74-141) 165/103 (01/22 1200) SpO2:  [97 %-100 %] 99 % (01/22 1200) FiO2 (%):  [30 %] 30 % (01/22 1116) Weight:  [167 lb 15.9 oz (76.2 kg)] 167 lb 15.9 oz (76.2 kg) (01/22 0500)  CBC:  Recent Labs  Lab 12/03/17 0629 12/05/17 0505  WBC 6.6 7.4  NEUTROABS 4.6 6.5  HGB 10.5* 11.6*  HCT 33.2* 35.0*  MCV 72.0* 72.2*  PLT 208 098    Basic Metabolic  Panel:  Recent Labs  Lab 12/03/17 0629 12/04/17 0336 12/05/17 0505  NA 151* 153* 150*  K 3.8 3.6 3.9  CL 117* 117* 114*  CO2 _0 GLUCOSE 131* 125* 149*  BUN 45* 39* 37*  CREATININE 1.84* 1.72* 1.59*  CALCIUM 8.4* 8.8* 9.3  PHOS 2.7 2.7  --     Lipid Panel:     Component Value Date/Time   CHOL 322 (H) 11/26/2017 0735   CHOL 295 (H) 05/29/2017 1537   TRIG 55 12/02/2017 1950   HDL 57 11/26/2017 0735   HDL 60 05/29/2017 1537   CHOLHDL 5.6 11/26/2017 0735   VLDL 14 11/26/2017 0735   LDLCALC 251 (H) 11/26/2017 0735   LDLCALC 224 (H) 05/29/2017 1537   HgbA1c:  Lab Results  Component Value Date   HGBA1C 5.7 (H) 11/26/2017   Urine Drug Screen:      Component Value Date/Time   LABOPIA NONE DETECTED 11/26/2017 0047   COCAINSCRNUR NONE DETECTED 11/26/2017 0047   LABBENZ NONE DETECTED 11/26/2017 0047   AMPHETMU NONE DETECTED 11/26/2017 0047   THCU NONE DETECTED 11/26/2017 0047   LABBARB NONE DETECTED 11/26/2017 0047    Alcohol Level     Component Value Date/Time   ETH <11 06/07/2013 1731    IMAGING I have personally reviewed the radiological images below and agree with the radiology interpretations.  CTA NECK:  1. No hemodynamically significant stenosis or acute vascular process in the neck.  2. 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 No pseudoaneurysm.  3. An incidental finding of potential clinical significance has been found.  Ectatic 3.6 cm ascending aorta.  Recommend annual imaging followup by CTA or MRA. This recommendation follows 2010 ACCF/AHA/AATS/ACR/ASA/SCA/SCAI/SIR/STS/SVM Guidelines for the Diagnosis and Management of Patients with Thoracic Aortic Disease. Circulation.2010; 121: H885-O277  CTA HEAD:  1. No emergent large vessel occlusion. Focally occluded RIGHT M2/M3 segment.  2. Advanced intracranial atherosclerosis with multifocal high-grade stenosis including severe stenosis LEFT M2 segment, moderate to severe stenoses RIGHT middle cerebral artery and LEFT posterior cerebral artery.  3. 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   CT PERFUSION:  1. No acute perfusion defect.  2. Prolonged mean transit time RIGHT MCA consistent with stenoses.   CT Head Wo Contrast 11/25/2017 IMPRESSION: 1. Potentially acute LEFT corona radiata/basal ganglia infarct. 2. ASPECTS is 9. 3. Old bilateral basal ganglia infarcts. 4. Moderate to severe white matter changes compatible chronic small vessel ischemic disease. 5. Mild global parenchymal brain volume loss for age.  TTE - Left ventricle: The cavity size was normal. Systolic function was   normal. The estimated  ejection fraction was in the range of 60%   to 65%. Wall motion was normal; there were no regional wall   motion abnormalities. - Aortic valve: There was mild regurgitation. - Atrial septum: No defect or patent foramen ovale was identified. - Pulmonary arteries: PA peak pressure: 32 mm Hg (S).  Mr Brain Wo Contrast 11/27/2017 IMPRESSION: 1. Small acute infarcts scattered in the left corona radiata, some tracking toward the posterior left lentiform. No associated acute hemorrhage or mass effect. 2. Underlying severe chronic ischemic disease in the bilateral deep gray matter nuclei with advanced chronic signal changes in the cerebral white matter, and brainstem Wallerian degeneration. 3. Intubated. Right nasoenteric tube in place. Fluid in the pharynx.    PHYSICAL EXAM  Temp:  [98.6 F (37 C)-99.4 F (37.4 C)] 99.2 F (37.3 C) (01/22 0800) Pulse Rate:  [  52-111] 99 (01/22 1200) Resp:  [13-32] 16 (01/22 1200) BP: (134-201)/(74-141) 165/103 (01/22 1200) SpO2:  [97 %-100 %] 99 % (01/22 1200) FiO2 (%):  [30 %] 30 % (01/22 1116) Weight:  [167 lb 15.9 oz (76.2 kg)] 167 lb 15.9 oz (76.2 kg) (01/22 0500)  General - Well nourished, well developed, intubated off sedation.  Ophthalmologic - Fundi not visualized due to small pupils.  Cardiovascular - Regular rate and rhythm.  Neuro - intubated off sedation, awake  with eyes open Will  follow   commands in all 4 extremities. Eyes are closed. Bilateral small pupils, positive corneals bilaterally, positive gag and cough.  Tone is increased in all extremities left more than right DTR normal on the right, but significantly increased on the left, babinski positive on the left. Still has significant spasticity at LUE and LLE. Sensation, coordination and gait not tested.   ASSESSMENT/PLAN [name] is a 57 y.o. male with history of previous strokes, history of polysubstance abuse, hyperlipidemia, hypertension, right basal ganglia hemorrhage in  1999 secondary to cocaine use, and left basal ganglia hemorrhage in December 2005, presenting with episodic aphasia vs. asarthria. He did not receive IV t-PA due to initial improvement in deficits and late presentation.   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  MRI head - left BG and CR 2 small infarcts  CTA head and neck - right M1, M2, and left M2 stenosis  2D Echo - EF 60-65%  LDL - 251  HgbA1c - 5.7  UDS - negative   VTE prophylaxis - Lovenox Fall precautions Diet NPO time specified Diet NPO time specified  aspirin 81 mg daily prior to admission, now on ASA 312m. Hold off plavix for possible trach and PEG procedure  Patient will be counseled to be compliant with his antithrombotic medications  Ongoing aggressive stroke risk factor management  Therapy recommendations:  pending  Disposition:  Pending  Respiratory distress  Due to pseudobulbar etiology  Intubated now  CCM on board  Off zosyn and vanco  Discussed with wife and Dr. GPearline Cablesplan elective tracheostomy in next few days  dysarthria and dysphagia   Pseudobulbar etiology with b/l involvement  On TF  May need PEG in the future if dysphagia persists  AKI, improving  Cre 1.30->1.97->2.37->2.38->1.78->1.79  Likely due to dehydration due to perfuse sweating and lack of po fluid  On IVF @ 60  On TF _0   Close monitoring  History of stroke  First stroke in 1999 as per wife  06/2004 CT head showed old right BG encephalomalacia  12/2006 left BG ICH, MRA h/n unremarkable  76/2836- left PLIC and CR infarct   Hypertension BP high today, may related to agitation on vent On norvasc and lisinopril Increase lisinopril to 263mbid Labetalol PRN Long-term BP goal normotensive  Hyperlipidemia  Home meds: lipitor  20.  LDL 251, goal < 70  Now on Lipitor 80 mg daily  Continue statin at discharge  Other Stroke Risk Factors  Former cigarette smoker - quit  ETOH use, advised to drink no more than 1 drink per day.  Hx of substance abuse  Other Active Problems  Hypokalemia - resolved after supplement  History of cocaine abuse - UDS negative   Hospital day # 9   Patient is making slow neurological improvement but it is unclear whether  he will be able to protect his airway and may likely need tracheostomy for airway protection Discussed with patient's wife at the bedside.. Discussed with Dr. Pearline Cables critical care medicine plan for elective tracheostomy in the next few days. This patient is critically ill due to respiratory distress, stroke, dysphagia,  and at significant risk of neurological worsening, death form recurrent stroke, seizure, aspiration pneumonia, heart failure, renal failure. This patient's care requires constant monitoring of vital signs, hemodynamics, respiratory and cardiac monitoring, review of multiple databases, neurological assessment, discussion with family, other specialists and medical decision making of high complexity. I spent 30 minutes of neurocritical care time in the care of this patient.  Antony Contras, MD Stroke Neurology 12/05/2017 1:54 PM   To contact Stroke Continuity provider, please refer to [email or web address] After hours, contact General Neurology

## 2017-12-05 NOTE — Progress Notes (Signed)
PICC team signed consent with wife Amy at the bedside but will place PICC line tomorrow first thing in the am. Charrise Lardner, Rande Brunt, RN

## 2017-12-05 NOTE — Progress Notes (Signed)
Orthopedic Tech Progress Note Patient Details:  [name] 30-Oct-1961 [id number]  Ortho Devices Type of Ortho Device: Prafo boot/shoe Ortho Device/Splint Location: Bilateral Prafo boots dropped off with floor nurse. / Floor nurse applied both prafo boots.         Kristopher Oppenheim 12/05/2017, 12:34 PM

## 2017-12-05 NOTE — Progress Notes (Signed)
PULMONARY / CRITICAL CARE MEDICINE   Name: [name] MRN: [id number] DOB: [birth year]    ADMISSION DATE:  11/25/2017  CHIEF COMPLAINT: Altered mental status requiring intubation and mechanical ventilation  HISTORY OF PRESENT ILLNESS:   This is a 57 year old with a distant prior history of CVA related to cocaine use who presented on 1/13 with difficulty with speech.  He subsequently had difficulty managing his secretions and required intubation on the day of admission.  CT scan showed stenosis of M1 and M2 segments on the left and there was infarction in the coronal radiata.  He was extubated on 1/19 but developed stridor and required reintubation.  He is currently intubated with a 6.5 mm endotracheal tube.   He is not sedated at the time of my examination and mains intubated and mechanically ventilated.  He was intermittently agitated last night, and received several doses of Versed.  He was not at all cooperative with physical therapy when they put him into a sitting position today, and he is rarely following any instruction.  By my examination he is still not moving any significant air around his endotracheal tube with the balloon deflated.  PAST MEDICAL HISTORY :  He  has a past medical history of Arthritis, Chest pain, Cocaine abuse ([place]), Diastolic dysfunction, Erectile dysfunction, Hyperlipidemia, Hypertension, Intracranial hemorrhage ([place]), Onychomycosis (April 2003), Prostate disorder, and Tinea versicolor.  PAST SURGICAL HISTORY: He  has a past surgical history that includes Colonoscopy with propofol (N/A, 05/22/2013).  No Known Allergies  No current facility-administered medications on file prior to encounter.    Current Outpatient Medications on File Prior to Encounter  Medication Sig  . amLODipine (NORVASC) 10 MG tablet Take 1 tablet (10 mg total) by mouth every morning.  Marland Kitchen aspirin 81 MG tablet Take 1 tablet (81 mg total) by mouth daily.  . hydrochlorothiazide (HYDRODIURIL) 25  MG tablet Take 1 tablet (25 mg total) by mouth every morning.  Marland Kitchen lisinopril (PRINIVIL,ZESTRIL) 20 MG tablet Take 1 tablet (20 mg total) by mouth daily.  . Multiple Vitamins-Minerals (HM MULTIVITAMIN ADULT GUMMY PO) Take 1 tablet by mouth every morning.  Marland Kitchen atorvastatin (LIPITOR) 20 MG tablet Take 1 tablet (20 mg total) by mouth daily. (Patient not taking: Reported on 11/25/2017)    FAMILY HISTORY:  His indicated that his mother is alive. He indicated that his father is deceased.   SOCIAL HISTORY: He  reports that  has never smoked. he has never used smokeless tobacco. He reports that he drinks alcohol. He reports that he does not use drugs.  REVIEW OF SYSTEMS:   Not obtainable  SUBJECTIVE:  As above  VITAL SIGNS: BP (!) 162/100   Pulse 89   Temp 99.2 F (37.3 C) (Axillary)   Resp 16   Ht 5\' 7"  (1.702 m)   Wt 167 lb 15.9 oz (76.2 kg)   SpO2 97%   BMI 26.31 kg/m   HEMODYNAMICS:    VENTILATOR SETTINGS: Vent Mode: PSV;CPAP FiO2 (%):  [30 %] 30 % Set Rate:  [18 bmp] 18 bmp Vt Set:  [530 mL] 530 mL PEEP:  [5 cmH20] 5 cmH20 Pressure Support:  [5 cmH20-10 cmH20] 10 cmH20 Plateau Pressure:  [15 cmH20-18 cmH20] 18 cmH20  INTAKE / OUTPUT: I/O last 3 completed shifts: In: 7035 [I.V.:2220; NG/GT:2220] Out: 3620 [Urine:3620]  PHYSICAL EXAMINATION: General: He is orally intubated and mechanically ventilated he is in no distress.  He is drooling, and cannot move air around the endotracheal tube when the cuff  is deflated.   Neuro: He very slowly and intermittently attempts to respond to instructions.  He does close eyes on request and he does move both upper extremities.  Pupils appear to be equal, he resists eye opening and is difficult to judge EOMs.  He has impressive rigidity more pronounced in the left limbs. Cardiovascular: S1 and S2 are regular without murmur rub or gallop Lungs: He is intubated with a 6.5 endotracheal tube, he is unable to move any air around the tube when  the cuff is deflated.  He does not breathe above the set ventilator rate.  There is symmetric air movement, no wheezes, no rales. Abdomen: The abdomen is soft without any organomegaly masses tenderness guarding or rebound.  He is anicteric   LABS:  BMET Recent Labs  Lab 12/03/17 0629 12/04/17 0336 12/05/17 0505  NA 151* 153* 150*  K 3.8 3.6 3.9  CL 117* 117* 114*  CO2 24 24 24   BUN 45* 39* 37*  CREATININE 1.84* 1.72* 1.59*  GLUCOSE 131* 125* 149*    Electrolytes Recent Labs  Lab 12/02/17 0500 12/03/17 0629 12/04/17 0336 12/05/17 0505  CALCIUM 8.9 8.4* 8.8* 9.3  PHOS 3.8 2.7 2.7  --     CBC Recent Labs  Lab 12/02/17 0500 12/03/17 0629 12/05/17 0505  WBC 5.6 6.6 7.4  HGB 11.6* 10.5* 11.6*  HCT 36.1* 33.2* 35.0*  PLT 205 208 232    Coag's No results for input(s): APTT, INR in the last 168 hours.  Sepsis Markers No results for input(s): LATICACIDVEN, PROCALCITON, O2SATVEN in the last 168 hours.  ABG Recent Labs  Lab 12/02/17 0131 12/02/17 0500 12/05/17 0358  PHART 7.334* 7.445 7.423  PCO2ART 58.4* 39.3 41.9  PO2ART 453.0* 103 114.0*    Liver Enzymes Recent Labs  Lab 12/03/17 0629 12/04/17 0336 12/05/17 0505  AST  --   --  36  ALT  --   --  46  ALKPHOS  --   --  82  BILITOT  --   --  0.6  ALBUMIN 2.5* 2.6* 2.9*    Cardiac Enzymes No results for input(s): TROPONINI, PROBNP in the last 168 hours.  Glucose Recent Labs  Lab 12/04/17 1225 12/04/17 1521 12/04/17 1939 12/04/17 2308 12/05/17 0303 12/05/17 0737  GLUCAP 122* 162* 136* 141* 134* 158*    Imaging No results found.     DISCUSSION:     Left corona radiata infarction superimposed on prior infarctions with residual deficits which appear to be in excess of findings on imaging studies.  An EEG has not shown evidence of seizure activity.  In addition he has airway compromise and cannot move air around the endotracheal tube today.  ASSESSMENT / PLAN:  PULMONARY A: His most  recent x-ray was on 120 and did not show significant parenchymal disease.  He does have a history of diastolic dysfunction but I was not impressed by the presence of CHF either.  He is currently intubated for airway protection.  He had stridor after extubation on 1/19 and he required reintubation.  He is still not able to move air around his endotracheal tube even after the addition of Decadron.  He continues to drool and I have discussed with his wife that I think it would be safest to manage him by proceeding to tracheostomy and she is agreeable to that.   CARDIOVASCULAR A: Continues Lipitor and aspirin for his stroke.  He is on Norvasc and lisinopril for blood pressure control.  A as  needed dose of labetalol is in place  GASTROINTESTINAL A: GI prophylaxis is with Pepcid.  NEUROLOGIC A: Neurologic status appears to be somewhat worse than accounted for by the findings on imaging studies.  I am going to be obtaining thyroid studies and liver function studies looking for metabolic provocations for decreased sensorium.  An ionized calcium has also been ordered.  EEG did not suggest seizure activity.   35 minutes was spent in the care of this patient today  Oneita H[facility] [place] Pager: [phone]  12/05/2017, 9:13 AM

## 2017-12-05 NOTE — Progress Notes (Signed)
Physical Therapy Treatment Patient Details Name: [name] MRN: [id number] DOB: 27-Mar-1961 Today's Date: 12/05/2017    History of Present Illness 57 y.o.malewith hx of previous strokes, polysubstance abuse, HLD, HTN, right basal ganglia hemorrhage in 1999, and left basal ganglia hemorrhage 2005, presenting with episodicaphasia vs. asarthria.Hedid not receive IV t-PA due to initial improvement in deficits and late presentation. 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 Patient developed anarthria, severe dysphagia and was intubated on 1/13.    PT Comments    Pt with increased lethargy this date due to requiring 2 doses of versed last night. Pt very rigid x4 extremities however much improved once sitting EOB with the exception of L UE with noted flexion contracture. Pt did wave to PT to command however otherwise with minimal commands following and difficulty maintaining eye opening t/o session. Pt did tolerate sitting EOB x15 min for AAROM x4 extremities. Acute PT to con't to follow. IF pt doesn't progress medically pt may need SNF upon d/c.   Follow Up Recommendations  CIR     Equipment Recommendations  (TBD)    Recommendations for Other Services Rehab consult     Precautions / Restrictions Precautions Precautions: Fall Precaution Comments: intubated Restrictions Weight Bearing Restrictions: No    Mobility  Bed Mobility Overal bed mobility: Needs Assistance Bed Mobility: Supine to Sit;Sit to Supine Rolling: Max assist   Supine to sit: Max assist;+2 for physical assistance Sit to supine: Max assist;+2 for physical assistance;+2 for safety/equipment   General bed mobility comments: pt with no initiation of transfer or physical assist  Transfers                 General transfer comment: unable to perform at this time  Ambulation/Gait                 Stairs             Wheelchair Mobility    Modified Rankin (Stroke Patients Only) Modified Rankin (Stroke Patients Only) Pre-Morbid Rankin Score: Moderate disability Modified Rankin: Severe disability     Balance Overall balance assessment: Needs assistance Sitting-balance support: Feet supported;Single extremity supported Sitting balance-Leahy Scale: Zero Sitting balance - Comments: pt dependent to maintain EOB balance                                    Cognition Arousal/Alertness: Lethargic Behavior During Therapy: Flat affect Overall Cognitive Status: Difficult to assess Area of Impairment: Following commands;Attention                   Current Attention Level: Focused   Following Commands: Follows one step commands inconsistently       General Comments: pt more lethargic this date. Per RN pt with restless night and was biting on the tube alot. pt received 2 doses of versaid last night as well.      Exercises Other Exercises Other Exercises: passive ROM to bilat LEs at EOB. and R UE. pt remains very rigid and in flexion contracture on L UE    General Comments        Pertinent Vitals/Pain Pain Assessment: Faces Faces Pain Scale: No hurt Pain Location: pt did withdrawl with attempted to straighten L UE    Home Living  Prior Function            PT Goals (current goals can now be found in the care plan section) Progress towards PT goals: Not progressing toward goals - comment(lethargic)    Frequency    Min 4X/week      PT Plan Current plan remains appropriate    Co-evaluation              AM-PAC PT "6 Clicks" Daily Activity  Outcome Measure  Difficulty turning over in bed (including adjusting bedclothes, sheets and blankets)?: Unable Difficulty moving from lying on back to sitting on the side of the bed? : Unable Difficulty sitting down on and standing up from a chair with arms (e.g., wheelchair, bedside  commode, etc,.)?: Unable Help needed moving to and from a bed to chair (including a wheelchair)?: Total Help needed walking in hospital room?: Total Help needed climbing 3-5 steps with a railing? : Total 6 Click Score: 6    End of Session Equipment Utilized During Treatment: (vent) Activity Tolerance: Patient tolerated treatment well Patient left: in bed;with call [place]/phone within reach;with bed alarm set;with nursing/sitter in room Nurse Communication: Mobility status PT Visit Diagnosis: Other symptoms and signs involving the nervous system (R29.898)     Time: 9379-0240 PT Time Calculation (min) (ACUTE ONLY): 28 min  Charges:  $Therapeutic Exercise: 8-22 mins $Therapeutic Activity: 8-22 mins                    G Codes:       Kittie Plater, PT, DPT Pager #: [phone] Office #: [phone]    [place] 12/05/2017, 10:05 AM

## 2017-12-06 ENCOUNTER — Inpatient Hospital Stay (HOSPITAL_COMMUNITY): Payer: Medicare HMO

## 2017-12-06 DIAGNOSIS — R1310 Dysphagia, unspecified: Secondary | ICD-10-CM

## 2017-12-06 DIAGNOSIS — J9601 Acute respiratory failure with hypoxia: Secondary | ICD-10-CM

## 2017-12-06 LAB — CALCIUM, IONIZED: CALCIUM, IONIZED, SERUM: 5.3 mg/dL (ref 4.5–5.6)

## 2017-12-06 LAB — BASIC METABOLIC PANEL
ANION GAP: 13 (ref 5–15)
BUN: 39 mg/dL — AB (ref 6–20)
CHLORIDE: 112 mmol/L — AB (ref 101–111)
CO2: 24 mmol/L (ref 22–32)
Calcium: 9.3 mg/dL (ref 8.9–10.3)
Creatinine, Ser: 1.63 mg/dL — ABNORMAL HIGH (ref 0.61–1.24)
GFR, EST AFRICAN AMERICAN: 53 mL/min — AB (ref >60)
GFR, EST NON AFRICAN AMERICAN: 46 mL/min — AB (ref >60)
Glucose, Bld: 141 mg/dL — ABNORMAL HIGH (ref 65–99)
POTASSIUM: 4 mmol/L (ref 3.5–5.1)
SODIUM: 149 mmol/L — AB (ref 135–145)

## 2017-12-06 LAB — BLOOD GAS, ARTERIAL
Acid-Base Excess: 3.7 mmol/L — ABNORMAL HIGH (ref 0.0–2.0)
BICARBONATE: 27.2 mmol/L (ref 20.0–28.0)
Drawn by: 347621
FIO2: 0.3
LHR: 18 {breaths}/min
O2 Saturation: 98.4 %
PATIENT TEMPERATURE: 99.1
PCO2 ART: 38.5 mmHg (ref 32.0–48.0)
PEEP: 5 cmH2O
VT: 530 mL
pH, Arterial: 7.465 — ABNORMAL HIGH (ref 7.350–7.450)
pO2, Arterial: 127 mmHg — ABNORMAL HIGH (ref 83.0–108.0)

## 2017-12-06 LAB — GLUCOSE, CAPILLARY
GLUCOSE-CAPILLARY: 117 mg/dL — AB (ref 65–99)
GLUCOSE-CAPILLARY: 117 mg/dL — AB (ref 65–99)
Glucose-Capillary: 140 mg/dL — ABNORMAL HIGH (ref 65–99)
Glucose-Capillary: 146 mg/dL — ABNORMAL HIGH (ref 65–99)
Glucose-Capillary: 155 mg/dL — ABNORMAL HIGH (ref 65–99)

## 2017-12-06 LAB — PROCALCITONIN

## 2017-12-06 LAB — PROTIME-INR
INR: 1.05
PROTHROMBIN TIME: 13.6 s (ref 11.4–15.2)

## 2017-12-06 LAB — TSH: TSH: 0.499 u[IU]/mL (ref 0.350–4.500)

## 2017-12-06 MED ORDER — ROCURONIUM BROMIDE 50 MG/5ML IV SOLN
50.0000 mg | Freq: Once | INTRAVENOUS | Status: AC
  Administered 2017-12-06: 50 mg via INTRAVENOUS
  Filled 2017-12-06: qty 5

## 2017-12-06 MED ORDER — VECURONIUM BROMIDE 10 MG IV SOLR: 10.0000 mg | Freq: Once | INTRAVENOUS | Status: DC

## 2017-12-06 MED ORDER — SODIUM CHLORIDE 0.9% FLUSH
10.0000 mL | Freq: Two times a day (BID) | INTRAVENOUS | Status: DC
  Administered 2017-12-06 (×2): 10 mL
  Administered 2017-12-07: 20 mL
  Administered 2017-12-07 – 2017-12-21 (×15): 10 mL

## 2017-12-06 MED ORDER — FENTANYL CITRATE (PF) 100 MCG/2ML IJ SOLN
200.0000 ug | Freq: Once | INTRAMUSCULAR | Status: AC
  Administered 2017-12-06: 100 ug via INTRAVENOUS

## 2017-12-06 MED ORDER — SODIUM CHLORIDE 0.9% FLUSH
10.0000 mL | INTRAVENOUS | Status: DC | PRN
  Administered 2017-12-12 – 2017-12-15 (×7): 10 mL
  Filled 2017-12-06 (×7): qty 40

## 2017-12-06 MED ORDER — MIDAZOLAM HCL 2 MG/2ML IJ SOLN
4.0000 mg | Freq: Once | INTRAMUSCULAR | Status: AC
  Administered 2017-12-06: 2 mg via INTRAVENOUS
  Filled 2017-12-06: qty 4

## 2017-12-06 MED ORDER — BACLOFEN 10 MG PO TABS
5.0000 mg | ORAL_TABLET | Freq: Two times a day (BID) | ORAL | Status: DC
  Administered 2017-12-07 – 2017-12-10 (×8): 5 mg via ORAL
  Filled 2017-12-06 (×9): qty 1

## 2017-12-06 MED ORDER — ETOMIDATE 2 MG/ML IV SOLN
40.0000 mg | Freq: Once | INTRAVENOUS | Status: AC
  Administered 2017-12-06: 20 mg via INTRAVENOUS

## 2017-12-06 NOTE — Consult Note (Signed)
[facility] Consult/Admission Note  [name] 23-Nov-1960  [id number].    Requesting MD: Dr. 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 He had difficulty managing his secretions and was intubated the day of admission. CT showed stenosis of M1 & M2 segments on the left and infarction in the coronal radiata. Pt was extubated on 01/19 but developed stridor and was reintubated. Pt is scheduled for tracheostomy on 01/25. We have been consulted for PEG tube placement. No know abdominal surgeries. Pt is taking aspirin and subq heparin. Currently receiving tube feeds. Pt is currently intubated so history is limited. No family at bedside. Pt denies pain or previous abdominal surgery.  ROS:  Review of Systems  Unable to perform ROS: Intubated     Family History  Problem Relation Age of Onset  . Alcohol abuse Father   . Liver cancer Father   . Diabetes Father     Past Medical History:  Diagnosis Date  . Arthritis   . Chest pain    with a negative adenosine cardiolite in December 2006. Dr. Liana Crocker   . Cocaine abuse ([place])    last used in 2000; currently in remission; no IVDU to my knowledge  . Diastolic dysfunction    impaired L ventricle relatation with an EF of 65% and echo in December 2005  . Erectile dysfunction   . Hyperlipidemia    elevated transaminases on statin?-40's (2/06) - Hep B/C negative 4/06; resolution on 4/06 labs; off statin in 2006  . Hypertension    concentric LVH 2d echo 12/05; negative proteinuria 11/00  . 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 Onychomycosis April 2003   right big toe  . Prostate disorder    prostate irregularity with obstructive voiding symptoms (PSA 0.67); -likely related to overstimulation of alpha receptors at bladder neck; seen by Dr. Reece Agar in 9/05. no BPH at that visit  . Tinea versicolor    dermatology referral; secondary to no treatement response to selenium sulfide; currently resolive    Past Surgical History:  Procedure Laterality Date  . COLONOSCOPY WITH PROPOFOL N/A 05/22/2013   Procedure: COLONOSCOPY WITH PROPOFOL;  Surgeon: Arta Silence, MD;  Location: [facility];  Service: Endoscopy;  Laterality: N/A;    Social History:  reports that  has never smoked. he has never used smokeless tobacco. He reports that he drinks alcohol. He reports that he does not use drugs.  Allergies: No Known Allergies  Medications Prior to Admission  Medication Sig Dispense Refill  . amLODipine (NORVASC) 10 MG tablet Take 1 tablet (10 mg total) by mouth every morning. 90 tablet 1  . aspirin 81 MG tablet Take 1 tablet (81 mg total) by mouth daily. 30 tablet 2  . hydrochlorothiazide (HYDRODIURIL) 25 MG tablet Take 1 tablet (25 mg total) by mouth every morning. 90 tablet 1  . lisinopril (PRINIVIL,ZESTRIL) 20 MG tablet Take 1 tablet (20 mg total) by mouth daily. 90 tablet 1  . Multiple Vitamins-Minerals (HM MULTIVITAMIN ADULT GUMMY PO) Take 1 tablet by  mouth every morning.    Marland Kitchen atorvastatin (LIPITOR) 20 MG tablet Take 1 tablet (20 mg total) by mouth daily. (Patient not taking: Reported on 11/25/2017) 90 tablet 1  . [DISCONTINUED] diclofenac sodium (VOLTAREN) 1 % GEL Apply 2 g topically 4 (four) times daily. (Patient not taking: Reported on 11/25/2017) 100 g 3  . [DISCONTINUED] ibuprofen (ADVIL,MOTRIN) 600 MG tablet TAKE 1 TABLET BY MOUTH EVERY 8 HOURS AS NEEDED FOR MODERATE PAIN (Patient not taking: Reported  on 11/25/2017) 30 tablet 1  . [DISCONTINUED] meloxicam (MOBIC) 15 MG tablet Take 1 tablet (15 mg total) by mouth daily. (Patient not taking: Reported on 11/25/2017) 30 tablet 3  . [DISCONTINUED] tadalafil (CIALIS) 10 MG tablet Take 1 tablet (10 mg total) by mouth daily as needed for erectile dysfunction. (Patient not taking: Reported on 11/25/2017) 10 tablet 5    Blood pressure (!) 147/101, pulse 74, temperature 98.8 F (37.1 C), temperature source Axillary, resp. rate 18, height 5' 7"  (1.702 m), weight 163 lb 5.8 oz (74.1 kg), SpO2 96 %.  Physical Exam  Constitutional: He is well-developed, well-nourished, and in no distress. No distress. He is intubated.  HENT:  Head: Normocephalic and atraumatic.  Nose: Nose normal.  Mouth/Throat: Oropharynx is clear and moist and mucous membranes are normal.  intubated  Eyes: Conjunctivae are normal. Pupils are equal, round, and reactive to light. Right eye exhibits no discharge. Left eye exhibits no discharge. No scleral icterus.  Cardiovascular: Normal rate, regular rhythm, normal heart sounds and intact distal pulses.  No murmur heard. Pulses:      Radial pulses are 2+ on the right side, and 2+ on the left side.       Dorsalis pedis pulses are 2+ on the right side, and 2+ on the left side.  Pulmonary/Chest: Breath sounds normal. He is intubated. He has no wheezes. He has no rhonchi. He has no rales.  Abdominal: Soft. Normal appearance and bowel sounds are normal. He exhibits no distension. There is no hepatosplenomegaly. There is no tenderness. There is no rigidity and no guarding. No hernia.  No abdominal scars noted  Musculoskeletal: He exhibits no edema or deformity.  Pt had limited movement of BLE   Neurological: He is alert.  Skin: Skin is warm and dry. No rash noted. He is not diaphoretic.  Psychiatric:  Unable to assess  Nursing note and vitals reviewed.   Results for orders placed or performed during the hospital encounter of 11/25/17  (from the past 48 hour(s))  Glucose, capillary     Status: Abnormal   Collection Time: 12/04/17 12:25 PM  Result Value Ref Range   Glucose-Capillary 122 (H) 65 - 99 mg/dL   Comment 1 Notify RN    Comment 2 Document in Chart   Glucose, capillary     Status: Abnormal   Collection Time: 12/04/17  3:21 PM  Result Value Ref Range   Glucose-Capillary 162 (H) 65 - 99 mg/dL   Comment 1 Notify RN    Comment 2 Document in Chart   Glucose, capillary     Status: Abnormal   Collection Time: 12/04/17  7:39 PM  Result Value Ref Range   Glucose-Capillary 136 (H) 65 - 99 mg/dL  Glucose, capillary     Status: Abnormal   Collection Time: 12/04/17 11:08 PM  Result Value Ref Range   Glucose-Capillary 141 (H) 65 - 99 mg/dL  Glucose, capillary     Status: Abnormal   Collection Time: 12/05/17  3:03 AM  Result  Value Ref Range   Glucose-Capillary 134 (H) 65 - 99 mg/dL  I-STAT 3, arterial blood gas (G3+)     Status: Abnormal   Collection Time: 12/05/17  3:58 AM  Result Value Ref Range   pH, Arterial 7.423 7.350 - 7.450   pCO2 arterial 41.9 32.0 - 48.0 mmHg   pO2, Arterial 114.0 (H) 83.0 - 108.0 mmHg   Bicarbonate 27.3 20.0 - 28.0 mmol/L   TCO2 29 22 - 32 mmol/L   O2 Saturation 98.0 %   Acid-Base Excess 3.0 (H) 0.0 - 2.0 mmol/L   Patient temperature 98.9 F    Collection site RADIAL, ALLEN'S TEST ACCEPTABLE    Drawn by RT    Sample type ARTERIAL   Comprehensive metabolic panel     Status: Abnormal   Collection Time: 12/05/17  5:05 AM  Result Value Ref Range   Sodium 150 (H) 135 - 145 mmol/L   Potassium 3.9 3.5 - 5.1 mmol/L   Chloride 114 (H) 101 - 111 mmol/L   CO2 24 22 - 32 mmol/L   Glucose, Bld 149 (H) 65 - 99 mg/dL   BUN 37 (H) 6 - 20 mg/dL   Creatinine, Ser 1.59 (H) 0.61 - 1.24 mg/dL   Calcium 9.3 8.9 - 10.3 mg/dL   Total Protein 6.8 6.5 - 8.1 g/dL   Albumin 2.9 (L) 3.5 - 5.0 g/dL   AST 36 15 - 41 U/L   ALT 46 17 - 63 U/L   Alkaline Phosphatase 82 38 - 126 U/L   Total Bilirubin 0.6 0.3  - 1.2 mg/dL   GFR calc non Af Amer 47 (L) >60 mL/min   GFR calc Af Amer 55 (L) >60 mL/min    Comment: (NOTE) The eGFR has been calculated using the CKD EPI equation. This calculation has not been validated in all clinical situations. eGFR's persistently <60 mL/min signify possible Chronic Kidney Disease.    Anion gap 12 5 - 15  CBC with Differential/Platelet     Status: Abnormal   Collection Time: 12/05/17  5:05 AM  Result Value Ref Range   WBC 7.4 4.0 - 10.5 K/uL    Comment: WHITE COUNT CONFIRMED ON SMEAR REPEATED TO VERIFY    RBC 4.85 4.22 - 5.81 MIL/uL   Hemoglobin 11.6 (L) 13.0 - 17.0 g/dL   HCT 35.0 (L) 39.0 - 52.0 %   MCV 72.2 (L) 78.0 - 100.0 fL   MCH 23.9 (L) 26.0 - 34.0 pg   MCHC 33.1 30.0 - 36.0 g/dL   RDW 15.0 11.5 - 15.5 %   Platelets 232 150 - 400 K/uL    Comment: REPEATED TO VERIFY PLATELET COUNT CONFIRMED BY SMEAR    Neutrophils Relative % 88 %   Neutro Abs 6.5 1.7 - 7.7 K/uL    Comment: CORRECTED ON 01/22 AT 9326: PREVIOUSLY REPORTED AS 6.4   Lymphocytes Relative 9 %   Lymphs Abs 0.7 0.7 - 4.0 K/uL   Monocytes Relative 3 %   Monocytes Absolute 0.2 0.1 - 1.0 K/uL   Eosinophils Relative 0 %   Eosinophils Absolute 0.0 0.0 - 0.7 K/uL   Basophils Relative 0 %   Basophils Absolute 0.0 0.0 - 0.1 K/uL   Smear Review MORPHOLOGY UNREMARKABLE   Procalcitonin - Baseline     Status: None   Collection Time: 12/05/17  5:05 AM  Result Value Ref Range   Procalcitonin <0.10 ng/mL    Comment:        Interpretation: PCT (Procalcitonin) <=  0.5 ng/mL: Systemic infection (sepsis) is not likely. Local bacterial infection is possible. (NOTE)       Sepsis PCT Algorithm           Lower Respiratory Tract                                      Infection PCT Algorithm    ----------------------------     ----------------------------         PCT < 0.25 ng/mL                PCT < 0.10 ng/mL         Strongly encourage             Strongly discourage   discontinuation of antibiotics     initiation of antibiotics    ----------------------------     -----------------------------       PCT 0.25 - 0.50 ng/mL            PCT 0.10 - 0.25 ng/mL               OR       >80% decrease in PCT            Discourage initiation of                                            antibiotics      Encourage discontinuation           of antibiotics    ----------------------------     -----------------------------         PCT >= 0.50 ng/mL              PCT 0.26 - 0.50 ng/mL               AND        <80% decrease in PCT             Encourage initiation of                                             antibiotics       Encourage continuation           of antibiotics    ----------------------------     -----------------------------        PCT >= 0.50 ng/mL                  PCT > 0.50 ng/mL               AND         increase in PCT                  Strongly encourage                                      initiation of antibiotics    Strongly encourage escalation           of antibiotics                                     -----------------------------  PCT <= 0.25 ng/mL                                                 OR                                        > 80% decrease in PCT                                     Discontinue / Do not initiate                                             antibiotics   Glucose, capillary     Status: Abnormal   Collection Time: 12/05/17  7:37 AM  Result Value Ref Range   Glucose-Capillary 158 (H) 65 - 99 mg/dL   Comment 1 Notify RN    Comment 2 Document in Chart   Glucose, capillary     Status: Abnormal   Collection Time: 12/05/17 11:12 AM  Result Value Ref Range   Glucose-Capillary 157 (H) 65 - 99 mg/dL   Comment 1 Notify RN    Comment 2 Document in Chart   Glucose, capillary     Status: Abnormal   Collection Time: 12/05/17  3:23 PM  Result Value Ref Range   Glucose-Capillary 166 (H) 65 - 99 mg/dL   Comment 1  Notify RN    Comment 2 Document in Chart   Glucose, capillary     Status: Abnormal   Collection Time: 12/05/17  7:31 PM  Result Value Ref Range   Glucose-Capillary 161 (H) 65 - 99 mg/dL  Glucose, capillary     Status: Abnormal   Collection Time: 12/05/17 11:25 PM  Result Value Ref Range   Glucose-Capillary 130 (H) 65 - 99 mg/dL  Glucose, capillary     Status: Abnormal   Collection Time: 12/06/17  3:06 AM  Result Value Ref Range   Glucose-Capillary 140 (H) 65 - 99 mg/dL  Procalcitonin     Status: None   Collection Time: 12/06/17  3:10 AM  Result Value Ref Range   Procalcitonin <0.10 ng/mL    Comment:        Interpretation: PCT (Procalcitonin) <= 0.5 ng/mL: Systemic infection (sepsis) is not likely. Local bacterial infection is possible. (NOTE)       Sepsis PCT Algorithm           Lower Respiratory Tract                                      Infection PCT Algorithm    ----------------------------     ----------------------------         PCT < 0.25 ng/mL                PCT < 0.10 ng/mL         Strongly encourage             Strongly discourage   discontinuation of antibiotics  initiation of antibiotics    ----------------------------     -----------------------------       PCT 0.25 - 0.50 ng/mL            PCT 0.10 - 0.25 ng/mL               OR       >80% decrease in PCT            Discourage initiation of                                            antibiotics      Encourage discontinuation           of antibiotics    ----------------------------     -----------------------------         PCT >= 0.50 ng/mL              PCT 0.26 - 0.50 ng/mL               AND        <80% decrease in PCT             Encourage initiation of                                             antibiotics       Encourage continuation           of antibiotics    ----------------------------     -----------------------------        PCT >= 0.50 ng/mL                  PCT > 0.50 ng/mL               AND          increase in PCT                  Strongly encourage                                      initiation of antibiotics    Strongly encourage escalation           of antibiotics                                     -----------------------------                                           PCT <= 0.25 ng/mL                                                 OR                                        >  80% decrease in PCT                                     Discontinue / Do not initiate                                             antibiotics   Basic metabolic panel     Status: Abnormal   Collection Time: 12/06/17  3:10 AM  Result Value Ref Range   Sodium 149 (H) 135 - 145 mmol/L   Potassium 4.0 3.5 - 5.1 mmol/L   Chloride 112 (H) 101 - 111 mmol/L   CO2 24 22 - 32 mmol/L   Glucose, Bld 141 (H) 65 - 99 mg/dL   BUN 39 (H) 6 - 20 mg/dL   Creatinine, Ser 1.63 (H) 0.61 - 1.24 mg/dL   Calcium 9.3 8.9 - 10.3 mg/dL   GFR calc non Af Amer 46 (L) >60 mL/min   GFR calc Af Amer 53 (L) >60 mL/min    Comment: (NOTE) The eGFR has been calculated using the CKD EPI equation. This calculation has not been validated in all clinical situations. eGFR's persistently <60 mL/min signify possible Chronic Kidney Disease.    Anion gap 13 5 - 15  Protime-INR     Status: None   Collection Time: 12/06/17  3:10 AM  Result Value Ref Range   Prothrombin Time 13.6 11.4 - 15.2 seconds   INR 1.05   TSH     Status: None   Collection Time: 12/06/17  3:10 AM  Result Value Ref Range   TSH 0.499 0.350 - 4.500 uIU/mL    Comment: Performed by a 3rd Generation assay with a functional sensitivity of <=0.01 uIU/mL.  Blood gas, arterial     Status: Abnormal   Collection Time: 12/06/17  5:24 AM  Result Value Ref Range   FIO2 0.30    Delivery systems VENTILATOR    Mode PRESSURE REGULATED VOLUME CONTROL    VT 530 mL   LHR 18 resp/min   Peep/cpap 5.0 cm H20   pH, Arterial 7.465 (H) 7.350 - 7.450   pCO2 arterial 38.5 32.0 - 48.0  mmHg   pO2, Arterial 127 (H) 83.0 - 108.0 mmHg   Bicarbonate 27.2 20.0 - 28.0 mmol/L   Acid-Base Excess 3.7 (H) 0.0 - 2.0 mmol/L   O2 Saturation 98.4 %   Patient temperature 99.1    Collection site LEFT RADIAL    Drawn by [id number]    Sample type ARTERIAL DRAW    Allens test (pass/fail) PASS PASS  Glucose, capillary     Status: Abnormal   Collection Time: 12/06/17  7:12 AM  Result Value Ref Range   Glucose-Capillary 146 (H) 65 - 99 mg/dL   Comment 1 Notify RN    Comment 2 Document in Chart    Dg Chest Port 1 View  Result Date: 12/06/2017 CLINICAL DATA:  Fevers EXAM: PORTABLE CHEST 1 VIEW COMPARISON:  12/03/2017 FINDINGS: Cardiac shadow is enlarged but stable. Endotracheal tube and nasogastric catheter are again noted and stable. The lungs are well aerated bilaterally. No focal infiltrate or sizable effusion is seen. No acute bony abnormality is noted. IMPRESSION: No acute abnormality noted. Electronically Signed   By: Inez Catalina M.D.   On: 12/06/2017 07:58  Assessment/Plan  Principal Problem:   Stroke [facility]) Active Problems:   Essential hypertension, benign   History of completed stroke   Microcytic anemia   Aphasia   Dysarthria   Oropharyngeal dysphagia   Bulbar palsy (HCC)   Aspiration pneumonia (HCC)   Endotracheally intubated   Acute kidney injury (HCC)   Atelectasis, bilateral   Encounter for nasogastric (NG) tube placement   Postextubation stridor  PEG tube placement - will coordinate PEG placement with trach placement which appears to be on 01/25 per Dr. Pearline Cables - no known abdominal surgeries and would recommend holding heparin 6 hours before PEG placement  Thank you for the consult.   Kalman Drape, [facility] 12/06/2017, 10:44 AM Pager: [phone] Consults: [phone] Mon-Fri 7:00 am-4:30 pm Sat-Sun 7:00 am-11:30 am

## 2017-12-06 NOTE — Progress Notes (Signed)
OT Cancellation    12/06/17 1000  OT Visit Information  Last OT Received On 12/06/17  Reason Eval/Treat Not Completed Patient at procedure or test/ unavailable (Pt having PICC line placed. Will return as schedual allows.)   Wilsie Kern MSOT, OTR/L Acute Rehab Pager: [phone] Office: [phone]

## 2017-12-06 NOTE — Care Management Note (Addendum)
Case Management Note  Patient Details  Name: [name] MRN: [id number] Date of Birth: [birth year]  Subjective/Objective:   Pt admitted on 11/25/17 s/p subcortical infarcts.  PTA, pt resided at home with spouse; needed assistance with ADLS.                    Action/Plan: Pt currently remains intubated.  PT/OT recommending CIR.  Plan tracheostomy at bedside today.  Will follow for discharge planning as pt progresses.    Expected Discharge Date:                  Expected Discharge Plan:  IP Rehab Facility   In-House Referral:  Clinical Social Work  Discharge planning Services  CM Consult  Post Acute Care Choice:    Choice offered to:     DME Arranged:    DME Agency:     HH Arranged:    [place] Agency:     Status of Service:  In process, will continue to follow  If discussed at Long Length of Stay Meetings, dates discussed:    Additional Comments:  Reinaldo Raddle, RN, BSN  Trauma/Neuro ICU Case Manager [phone]

## 2017-12-06 NOTE — Progress Notes (Signed)
Peripherally Inserted Central Catheter/Midline Placement  The IV Nurse has discussed with the patient and/or persons authorized to consent for the patient, the purpose of this procedure and the potential benefits and risks involved with this procedure.  The benefits include less needle sticks, lab draws from the catheter, and the patient may be discharged home with the catheter. Risks include, but not limited to, infection, bleeding, blood clot (thrombus formation), and puncture of an artery; nerve damage and irregular heartbeat and possibility to perform a PICC exchange if needed/ordered by physician.  Alternatives to this procedure were also discussed.  Bard Power PICC patient education guide, fact sheet on infection prevention and patient information card has been provided to patient /or left at bedside.    PICC/Midline Placement Documentation     Consent obtained by wife   Darlyn Read 12/06/2017, 10:54 AM

## 2017-12-06 NOTE — Procedures (Signed)
Percutaneous Tracheostomy Placement  Consent from family.  Patient sedated, paralyzed and position.  Placed on 100% FiO2 and RR matched.  Area cleaned and draped.  Lidocaine/epi injected.  Skin incision done followed by blunt dissection.  Trachea palpated then punctured, catheter passed and visualized bronchoscopically.  Wire placed and visualized.  Catheter removed.  Airway then crushed and dilated.  Size 6 cuffed shiley trach placed and visualized bronchoscopically well above carina.  Good volume returns.  Patient tolerated the procedure well without complications.  Minimal blood loss.  CXR ordered and pending.  Sharlon Pfohl G. Rossie Scarfone, M.D.  Pulmonary/Critical Care Medicine. Pager: [phone]. After hours pager: [phone]. 

## 2017-12-06 NOTE — Progress Notes (Signed)
Bedside PEG scheduled for 12/07/17 at 1100 with Dr. Grandville Silos.  Jackson Latino, [facility] Pager [phone]

## 2017-12-06 NOTE — Progress Notes (Signed)
STROKE TEAM PROGRESS NOTE   SUBJECTIVE (INTERVAL HISTORY) His wife is  at bedside. Pt is awake and interactive and follows commands in all 4 extremities. Critical care feels he cannot protect his airway and he is scheduled for elective tracheostomy and I have spoken to his wife and requested peg tube as well to facilitate his nutrition as he is unlikely to be able to swallow safely for a while.  Past Medical History:  Diagnosis Date  . Arthritis   . Chest pain    with a negative adenosine cardiolite in December 2006. Dr. Liana Crocker   . Cocaine abuse ([place])    last used in 2000; currently in remission; no IVDU to my knowledge  . Diastolic dysfunction    impaired L ventricle relatation with an EF of 65% and echo in December 2005  . Erectile dysfunction   . Hyperlipidemia    elevated transaminases on statin?-40's (2/06) - Hep B/C negative 4/06; resolution on 4/06 labs; off statin in 2006  . Hypertension    concentric LVH 2d echo 12/05; negative proteinuria 11/00  . 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 Onychomycosis April 2003   right big toe  . Prostate disorder    prostate irregularity with obstructive voiding symptoms (PSA 0.67); -likely related to overstimulation of alpha receptors at bladder neck; seen by Dr. Reece Agar in 9/05. no BPH at that visit  . Tinea versicolor    dermatology referral; secondary to no treatement response to selenium sulfide; currently resolive    HOME MEDICATIONS:  Current Meds  Medication Sig  . amLODipine (NORVASC) 10 MG tablet Take 1 tablet (10 mg total) by mouth every morning.  Marland Kitchen aspirin 81 MG tablet Take 1 tablet (81 mg total) by mouth daily.  .  hydrochlorothiazide (HYDRODIURIL) 25 MG tablet Take 1 tablet (25 mg total) by mouth every morning.  Marland Kitchen lisinopril (PRINIVIL,ZESTRIL) 20 MG tablet Take 1 tablet (20 mg total) by mouth daily.  . Multiple Vitamins-Minerals (HM MULTIVITAMIN ADULT GUMMY PO) Take 1 tablet by mouth every morning.     HOSPITAL MEDICATIONS:  . amLODipine  10 mg Per Tube q morning - 10a  . aspirin  325 mg Per Tube Daily  . atorvastatin  80 mg Per Tube q1800  . chlorhexidine gluconate (MEDLINE KIT)  15 mL Mouth Rinse BID  . docusate  100 mg Per Tube BID  . etomidate  40 mg Intravenous Once  . fentaNYL (SUBLIMAZE) injection  200 mcg Intravenous Once  . heparin injection (subcutaneous)  5,000 Units Subcutaneous Q8H  . insulin aspart  0-9 Units Subcutaneous TID WC  . lisinopril  20 mg Per Tube BID  . mouth rinse  15 mL Mouth Rinse 10 times per day  . midazolam  4 mg Intravenous Once  . sennosides  5 mL Per Tube BID  . sodium chloride flush  10-40 mL Intracatheter Q12H  . vecuronium  10 mg Intravenous Once    OBJECTIVE Temp:  [98.5 F (36.9 C)-100 F (37.8 C)] 99 F (37.2 C) (01/23 1200) Pulse Rate:  [73-128] 74 (01/23 1200) Cardiac Rhythm: Normal sinus rhythm (01/23 0800) Resp:  [15-25] 18 (01/23 1200) BP: (137-189)/(94-124) 161/101 (01/23 1200) SpO2:  [94 %-100 %] 97 % (01/23 1200)  FiO2 (%):  [30 %] 30 % (01/23 1121) Weight:  [163 lb 5.8 oz (74.1 kg)] 163 lb 5.8 oz (74.1 kg) (01/23 0500)  CBC:  Recent Labs  Lab 12/03/17 0629 12/05/17 0505  WBC 6.6 7.4  NEUTROABS 4.6 6.5  HGB 10.5* 11.6*  HCT 33.2* 35.0*  MCV 72.0* 72.2*  PLT 208 473    Basic Metabolic Panel:  Recent Labs  Lab 12/03/17 0629 12/04/17 0336 12/05/17 0505 12/06/17 0310  NA 151* 153* 150* 149*  K 3.8 3.6 3.9 4.0  CL 117* 117* 114* 112*  CO2 _0 GLUCOSE 131* 125* 149* 141*  BUN 45* 39* 37* 39*  CREATININE 1.84* 1.72* 1.59* 1.63*  CALCIUM 8.4* 8.8* 9.3 9.3  PHOS 2.7 2.7  --   --     Lipid Panel:     Component  Value Date/Time   CHOL 322 (H) 11/26/2017 0735   CHOL 295 (H) 05/29/2017 1537   TRIG 55 12/02/2017 1950   HDL 57 11/26/2017 0735   HDL 60 05/29/2017 1537   CHOLHDL 5.6 11/26/2017 0735   VLDL 14 11/26/2017 0735   LDLCALC 251 (H) 11/26/2017 0735   LDLCALC 224 (H) 05/29/2017 1537   HgbA1c:  Lab Results  Component Value Date   HGBA1C 5.7 (H) 11/26/2017   Urine Drug Screen:     Component Value Date/Time   LABOPIA NONE DETECTED 11/26/2017 0047   COCAINSCRNUR NONE DETECTED 11/26/2017 0047   LABBENZ NONE DETECTED 11/26/2017 0047   AMPHETMU NONE DETECTED 11/26/2017 0047   THCU NONE DETECTED 11/26/2017 0047   LABBARB NONE DETECTED 11/26/2017 0047    Alcohol Level     Component Value Date/Time   ETH <11 06/07/2013 1731    IMAGING I have personally reviewed the radiological images below and agree with the radiology interpretations.  CTA NECK:  1. No hemodynamically significant stenosis or acute vascular process in the neck.  2. 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 No pseudoaneurysm.  3. An incidental finding of potential clinical significance has been found.  Ectatic 3.6 cm ascending aorta.  Recommend annual imaging followup by CTA or MRA. This recommendation follows 2010 ACCF/AHA/AATS/ACR/ASA/SCA/SCAI/SIR/STS/SVM Guidelines for the Diagnosis and Management of Patients with Thoracic Aortic Disease. Circulation.2010; 121: U037-Q964  CTA HEAD:  1. No emergent large vessel occlusion. Focally occluded RIGHT M2/M3 segment.  2. Advanced intracranial atherosclerosis with multifocal high-grade stenosis including severe stenosis LEFT M2 segment, moderate to severe stenoses RIGHT middle cerebral artery and LEFT posterior cerebral artery.  3. 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   CT PERFUSION:  1. No acute perfusion defect.  2. Prolonged mean transit time RIGHT MCA consistent with stenoses.   CT Head Wo  Contrast 11/25/2017 IMPRESSION: 1. Potentially acute LEFT corona radiata/basal ganglia infarct. 2. ASPECTS is 9. 3. Old bilateral basal ganglia infarcts. 4. Moderate to severe white matter changes compatible chronic small vessel ischemic disease. 5. Mild global parenchymal brain volume loss for age.  TTE - Left ventricle: The cavity size was normal. Systolic function was   normal. The estimated ejection fraction was in the range of 60%   to 65%. Wall motion was normal; there were no regional wall   motion abnormalities. - Aortic valve: There was mild regurgitation. - Atrial septum: No defect or patent foramen ovale was identified. - Pulmonary arteries: PA peak pressure: 32 mm Hg (S).  Mr Brain Wo Contrast 11/27/2017 IMPRESSION: 1. Small acute infarcts scattered in the left  corona radiata, some tracking toward the posterior left lentiform. No associated acute hemorrhage or mass effect. 2. Underlying severe chronic ischemic disease in the bilateral deep gray matter nuclei with advanced chronic signal changes in the cerebral white matter, and brainstem Wallerian degeneration. 3. Intubated. Right nasoenteric tube in place. Fluid in the pharynx.    PHYSICAL EXAM  Temp:  [98.5 F (36.9 C)-100 F (37.8 C)] 99 F (37.2 C) (01/23 1200) Pulse Rate:  [73-128] 74 (01/23 1200) Resp:  [15-25] 18 (01/23 1200) BP: (137-189)/(94-124) 161/101 (01/23 1200) SpO2:  [94 %-100 %] 97 % (01/23 1200) FiO2 (%):  [30 %] 30 % (01/23 1121) Weight:  [163 lb 5.8 oz (74.1 kg)] 163 lb 5.8 oz (74.1 kg) (01/23 0500)  General - Well nourished, well developed, intubated off sedation.  Ophthalmologic - Fundi not visualized due to small pupils.  Cardiovascular - Regular rate and rhythm.  Neuro - intubated off sedation, awake  with eyes open Will  follow   commands in all 4 extremities. Eyes are closed. Bilateral small pupils, positive corneals bilaterally, positive gag and cough.  Tone is increased in all  extremities left more than right DTR normal on the right, but significantly increased on the left, babinski positive on the left. Still has significant spasticity at LUE and LLE. Sensation, coordination and gait not tested.   ASSESSMENT/PLAN [name] is a 58 y.o. 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 asarthria. He did not receive IV t-PA due to initial improvement in deficits and late presentation.   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  MRI head - left BG and CR 2 small infarcts  CTA head and neck - right M1, M2, and left M2 stenosis  2D Echo - EF 60-65%  LDL - 251  HgbA1c - 5.7  UDS - negative   VTE prophylaxis - Lovenox Fall precautions Diet NPO time specified Diet NPO time specified  aspirin 81 mg daily prior to admission, now on ASA 368m. Hold off plavix for possible trach and PEG procedure  Patient will be counseled to be compliant with his antithrombotic medications  Ongoing aggressive stroke risk factor management  Therapy recommendations:  CLRDisposition:  Pending  Respiratory distress  Due to pseudobulbar etiology  Intubated now  CCM on board  Off zosyn and vanco  Discussed with wife and Dr. GPearline Cablesplan elective tracheostomy and PEG in next few days  dysarthria and dysphagia   Pseudobulbar etiology with b/l involvement  On TF   AKI, improving  Cre 1.30->1.97->2.37->2.38->1.78->1.79  Likely due to dehydration due to perfuse sweating and lack of po fluid  On IVF @ 60  On TF _0   Close monitoring  History of stroke  First stroke in 1999  as per wife  06/2004 CT head showed old right BG encephalomalacia  12/2006 left BG ICH, MRA h/n unremarkable  70/0174- left PLIC and CR infarct   Hypertension BP high today, may related to agitation on vent On norvasc and lisinopril Increase lisinopril to 269mbid Labetalol PRN Long-term BP goal normotensive  Hyperlipidemia  Home meds: lipitor 20.  LDL 251, goal < 70  Now on Lipitor 80 mg daily  Continue  statin at discharge  Other Stroke Risk Factors  Former cigarette smoker - quit  ETOH use, advised to drink no more than 1 drink per day.  Hx of substance abuse  Other Active Problems  Hypokalemia - resolved after supplement  History of cocaine abuse - UDS negative   Hospital day # 10   I had a long discussion with the patient's wife and Dr. Pearline Cables about the patient's condition and answered questions. Recommend elective tracheostomy and PEG tube likely tomorrow. Discussed with Dr. Nelda Marseille. He will likely need transfer to rehabilitation. Greater than 50% time during this 35 minute visit was spent on counseling and coordination of care about his stroke, dysphagia and respiratory failure and discussion with Dr. Nelda Marseille, Dr. Pearline Cables and patient's wife and answered questions.  Follow-up as an outpatient in stroke clinic in 6 weeks. Stroke team will sign off. Kindly call for questions.  Antony Contras, MD Stroke Neurology 12/06/2017 1:12 PM   To contact Stroke Continuity provider, please refer to [email or web address] After hours, contact General Neurology

## 2017-12-06 NOTE — Progress Notes (Signed)
PULMONARY / CRITICAL CARE MEDICINE   Name: [name] MRN: [id number] DOB: 02/09/1961    ADMISSION DATE:  11/25/2017  CHIEF COMPLAINT: Altered mental status requiring intubation and mechanical ventilation  HISTORY OF PRESENT ILLNESS:   This is a 57 year old with a distant prior history of CVA related to cocaine use who presented on 1/13 with difficulty with speech.  He subsequently had difficulty managing his secretions and required intubation on the day of admission.  CT scan showed stenosis of M1 and M2 segments on the left and there was infarction in the coronal radiata.  He was extubated on 1/19 but developed stridor and required reintubation.  He is currently intubated with a 6.5 mm endotracheal tube.   He is again not sedated at the time of my examination and remains intubated and mechanically ventilated. He is tolerating PS 5, but is not at all alert or interactive.  PAST MEDICAL HISTORY :  He  has a past medical history of Arthritis, Chest pain, Cocaine abuse ([place]), Diastolic dysfunction, Erectile dysfunction, Hyperlipidemia, Hypertension, Intracranial hemorrhage ([place]), Onychomycosis (April 2003), Prostate disorder, and Tinea versicolor.  PAST SURGICAL HISTORY: He  has a past surgical history that includes Colonoscopy with propofol (N/A, 05/22/2013).  No Known Allergies  No current facility-administered medications on file prior to encounter.    Current Outpatient Medications on File Prior to Encounter  Medication Sig  . amLODipine (NORVASC) 10 MG tablet Take 1 tablet (10 mg total) by mouth every morning.  Marland Kitchen aspirin 81 MG tablet Take 1 tablet (81 mg total) by mouth daily.  . hydrochlorothiazide (HYDRODIURIL) 25 MG tablet Take 1 tablet (25 mg total) by mouth every morning.  Marland Kitchen lisinopril (PRINIVIL,ZESTRIL) 20 MG tablet Take 1 tablet (20 mg total) by mouth daily.  . Multiple Vitamins-Minerals (HM MULTIVITAMIN ADULT GUMMY PO) Take 1 tablet by mouth every morning.  Marland Kitchen atorvastatin  (LIPITOR) 20 MG tablet Take 1 tablet (20 mg total) by mouth daily. (Patient not taking: Reported on 11/25/2017)    FAMILY HISTORY:  His indicated that his mother is alive. He indicated that his father is deceased.   SOCIAL HISTORY: He  reports that  has never smoked. he has never used smokeless tobacco. He reports that he drinks alcohol. He reports that he does not use drugs.  REVIEW OF SYSTEMS:   Not obtainable  SUBJECTIVE:  As above  VITAL SIGNS: BP (!) 137/94   Pulse (!) 112   Temp 98.8 F (37.1 C) (Axillary)   Resp 19   Ht 5\' 7"  (1.702 m)   Wt 163 lb 5.8 oz (74.1 kg)   SpO2 100%   BMI 25.59 kg/m   HEMODYNAMICS:    VENTILATOR SETTINGS: Vent Mode: CPAP;PSV FiO2 (%):  [30 %] 30 % Set Rate:  [18 bmp] 18 bmp Vt Set:  [530 mL] 530 mL PEEP:  [5 cmH20] 5 cmH20 Pressure Support:  [5 cmH20-10 cmH20] 5 cmH20 Plateau Pressure:  [13 cmH20-17 cmH20] 15 cmH20  INTAKE / OUTPUT: I/O last 3 completed shifts: In: 3997.8 [I.V.:2040; NG/GT:1757.8; IV Piggyback:200] Out: 9563 [Urine:4695]  PHYSICAL EXAMINATION: General: He is orally intubated and mechanically ventilated he is in no distress.    Neuro: He very slowly and intermittently attempts to respond to instructions.  He does close eyes on request and he does move both upper extremities.  Pupils are be equal, he seems to have a right gaze preference.  He has impressive rigidity more pronounced in the left limbs. Cardiovascular: S1 and S2 are  regular without murmur rub or gallop Lungs: He is intubated with a 6.5 endotracheal tube, he is unlabored on PS5.  There is symmetric air movement, no wheezes, no rales. Abdomen: The abdomen is soft without any organomegaly masses tenderness guarding or rebound.  He is anicteric   LABS:  BMET Recent Labs  Lab 12/04/17 0336 12/05/17 0505 12/06/17 0310  NA 153* 150* 149*  K 3.6 3.9 4.0  CL 117* 114* 112*  CO2 24 24 24   BUN 39* 37* 39*  CREATININE 1.72* 1.59* 1.63*  GLUCOSE 125*  149* 141*    Electrolytes Recent Labs  Lab 12/02/17 0500 12/03/17 0629 12/04/17 0336 12/05/17 0505 12/06/17 0310  CALCIUM 8.9 8.4* 8.8* 9.3 9.3  PHOS 3.8 2.7 2.7  --   --     CBC Recent Labs  Lab 12/02/17 0500 12/03/17 0629 12/05/17 0505  WBC 5.6 6.6 7.4  HGB 11.6* 10.5* 11.6*  HCT 36.1* 33.2* 35.0*  PLT 205 208 232    Coag's Recent Labs  Lab 12/06/17 0310  INR 1.05    Sepsis Markers Recent Labs  Lab 12/05/17 0505 12/06/17 0310  PROCALCITON <0.10 <0.10    ABG Recent Labs  Lab 12/02/17 0500 12/05/17 0358 12/06/17 0524  PHART 7.445 7.423 7.465*  PCO2ART 39.3 41.9 38.5  PO2ART 103 114.0* 127*    Liver Enzymes Recent Labs  Lab 12/03/17 0629 12/04/17 0336 12/05/17 0505  AST  --   --  36  ALT  --   --  46  ALKPHOS  --   --  82  BILITOT  --   --  0.6  ALBUMIN 2.5* 2.6* 2.9*    Cardiac Enzymes No results for input(s): TROPONINI, PROBNP in the last 168 hours.  Glucose Recent Labs  Lab 12/05/17 1112 12/05/17 1523 12/05/17 1931 12/05/17 2325 12/06/17 0306 12/06/17 0712  GLUCAP 157* 166* 161* 130* 140* 146*    Imaging Dg Chest Port 1 View  Result Date: 12/06/2017 CLINICAL DATA:  Fevers EXAM: PORTABLE CHEST 1 VIEW COMPARISON:  12/03/2017 FINDINGS: Cardiac shadow is enlarged but stable. Endotracheal tube and nasogastric catheter are again noted and stable. The lungs are well aerated bilaterally. No focal infiltrate or sizable effusion is seen. No acute bony abnormality is noted. IMPRESSION: No acute abnormality noted. Electronically Signed   By: Inez Catalina M.D.   On: 12/06/2017 07:58       DISCUSSION:     Left corona radiata infarction superimposed on prior infarctions which had resulted in an inability to effectively use the left upper extremity at baseline.   An EEG has not shown evidence of seizure activity.  In addition he has airway compromise, and his current mental status quite frankly precludes extubation despite mechanics and gas  exchange that are very adequate.  And cannot move air around the endotracheal tube today.  ASSESSMENT / PLAN:  PULMONARY A: His most recent x-ray was on 120 and did not show significant parenchymal disease.  He does have a history of diastolic dysfunction but I was not impressed by the presence of CHF either.  He is currently intubated for airway protection.  He had stridor after extubation on 1/19 and he required reintubation.  He is was still not able to move air around his endotracheal tube even after the addition of Decadron.  He continued to drool and I have discussed with his wife that I think it would be safest to manage him by proceeding to tracheostomy and she is agreeable to that.  Tracheostomy is scheduled for 1/25   CARDIOVASCULAR A: Continues Lipitor and aspirin for his stroke.  He is on Norvasc and lisinopril for blood pressure control.  A as needed dose of labetalol is in place  GASTROINTESTINAL A: GI prophylaxis is with [facility] and [place] Pager: [phone]  12/06/2017, 9:06 AM

## 2017-12-06 NOTE — Procedures (Signed)
Bronchoscopy Procedure Note [name] [id number] 08-21-1961  Procedure: Bronchoscopy Indications: trach placement   Procedure Details Consent: Risks of procedure as well as the alternatives and risks of each were explained to the (patient/caregiver).  Consent for procedure obtained. Time Out: Verified patient identification, verified procedure, site/side was marked, verified correct patient position, special equipment/implants available, medications/allergies/relevent history reviewed, required imaging and test results available.  Performed  In preparation for procedure, patient was given 100% FiO2. Sedation: Benzodiazepines, Muscle relaxants and Etomidate Procedure done by P Babcock ACNP-BC, under direct supervision of Dr Nelda Marseille. At first bronch was introduce through ET tube and structures of tracheal rings, carina identified for operator of tracheostomy who was Dr Nelda Marseille. See Dr Pura Spice note re: intubation. Light of bronch passed through trachea and skin for indentification of tracheal rings for tracheostomy puncture. After this, under bronchoscopy guidance,  ET tube was pulled back sufficiently and very carefully. The ET tube was  pulled back enough to give room for tracheostomy operator and yet at same time to to ensure a secured airway. After this was accomplished, bronchoscope was withdrawn into the ET tube. After this,  Dr Nelda Marseille  then performed tracheostomy under video visual provided by flexible video bronchoscopy. Followng introduction of tracheostomy,  the bronchoscope was removed from ET tube and introduced through tracheostomy. Correct position of tracheostomy was ensured, with enough room between carina and distal tracheostomy and no evidence of bleeding. The bronchoscope was then withdrawn. Respiratory therapist was then instructed to remove the ET tube.  Dr Nelda Marseille  then proceeded to complete the tracheostomy with stay sutures   No complications   Evaluation Hemodynamic Status:  BP stable throughout; O2 sats: transiently fell during during procedure Patient's Current Condition: stable Specimens:  None Complications: No apparent complications Patient did tolerate procedure well. Erick Colace ACNP-BC [place] Pager # [phone] OR # [phone] if no answer   Clementeen Graham 12/06/2017  Rush Farmer, M.D. [facility] Pulmonary/Critical Care Medicine. Pager: [phone]. After hours pager: [phone].

## 2017-12-06 NOTE — Procedures (Signed)
Intubation Procedure Note [name] [id number] 1961/05/26  Procedure: Intubation Indications: Respiratory insufficiency  Procedure Details Consent: Risks of procedure as well as the alternatives and risks of each were explained to the (patient/caregiver).  Consent for procedure obtained. Time Out: Verified patient identification, verified procedure, site/side was marked, verified correct patient position, special equipment/implants available, medications/allergies/relevent history reviewed, required imaging and test results available.  Performed  Maximum sterile technique was used including gloves, hand hygiene and mask.  MAC    Evaluation Hemodynamic Status: BP stable throughout; O2 sats: stable throughout Patient's Current Condition: stable Complications: No apparent complications Patient did tolerate procedure well. Chest X-ray ordered to verify placement.  CXR: pending.   Jennet Maduro 12/06/2017

## 2017-12-07 ENCOUNTER — Encounter (HOSPITAL_COMMUNITY)
Admission: EM | Disposition: A | Discharge status: Skilled Nursing Facility | Payer: Self-pay | Source: Home / Self Care | Attending: Critical Care Medicine

## 2017-12-07 HISTORY — PX: ESOPHAGOGASTRODUODENOSCOPY: SHX5428

## 2017-12-07 HISTORY — PX: PEG PLACEMENT: SHX5437

## 2017-12-07 LAB — GLUCOSE, CAPILLARY
GLUCOSE-CAPILLARY: 111 mg/dL — AB (ref 65–99)
GLUCOSE-CAPILLARY: 110 mg/dL — AB (ref 65–99)
GLUCOSE-CAPILLARY: 107 mg/dL — AB (ref 65–99)
GLUCOSE-CAPILLARY: 97 mg/dL (ref 65–99)
Glucose-Capillary: 122 mg/dL — ABNORMAL HIGH (ref 65–99)
Glucose-Capillary: 115 mg/dL — ABNORMAL HIGH (ref 65–99)
Glucose-Capillary: 129 mg/dL — ABNORMAL HIGH (ref 65–99)

## 2017-12-07 LAB — PROCALCITONIN

## 2017-12-07 SURGERY — EGD (ESOPHAGOGASTRODUODENOSCOPY)
Anesthesia: Moderate Sedation

## 2017-12-07 MED ORDER — VECURONIUM BROMIDE 10 MG IV SOLR
10.0000 mg | Freq: Once | INTRAVENOUS | Status: AC
  Administered 2017-12-07: 10 mg via INTRAVENOUS
  Filled 2017-12-07: qty 10

## 2017-12-07 MED ORDER — CEFAZOLIN SODIUM-DEXTROSE 2-4 GM/100ML-% IV SOLN
2.0000 g | Freq: Once | INTRAVENOUS | Status: AC
  Administered 2017-12-07: 2 g via INTRAVENOUS
  Filled 2017-12-07: qty 100

## 2017-12-07 MED ORDER — INSULIN ASPART 100 UNIT/ML ~~LOC~~ SOLN
0.0000 [IU] | SUBCUTANEOUS | Status: DC
  Administered 2017-12-07 – 2017-12-19 (×37): 1 [IU] via SUBCUTANEOUS
  Administered 2017-12-20: 2 [IU] via SUBCUTANEOUS
  Administered 2017-12-20 – 2017-12-22 (×4): 1 [IU] via SUBCUTANEOUS

## 2017-12-07 MED ORDER — FENTANYL CITRATE (PF) 100 MCG/2ML IJ SOLN
100.0000 ug | Freq: Once | INTRAMUSCULAR | Status: AC
  Administered 2017-12-07: 100 ug via INTRAVENOUS
  Filled 2017-12-07: qty 2

## 2017-12-07 MED ORDER — MIDAZOLAM HCL 2 MG/2ML IJ SOLN
2.0000 mg | Freq: Once | INTRAMUSCULAR | Status: AC
  Administered 2017-12-07: 2 mg via INTRAVENOUS
  Filled 2017-12-07: qty 2

## 2017-12-07 NOTE — Evaluation (Signed)
Occupational Therapy Re-Evaluation Patient Details Name: [name] MRN: [id number] DOB: 07/13/1961 Today's Date: 12/07/2017    History of Present Illness 57 y.o.malewith hx of previous strokes, polysubstance abuse, HLD, HTN, right basal ganglia hemorrhage in 1999, and left basal ganglia hemorrhage 2005, presenting with episodicaphasia vs. dysarthria.Hedid not receive IV t-PA due to initial improvement in deficits and late presentation. 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 Patient developed severe dysphagia and was intubated on 1/13, trach 1/23, PEG 1/24   Clinical Impression   Pt seen today for re-evaluation as he was intubated and now has trach.   Pt  Presents alert >lethargic.  He was able to sit EOB with bed placed in egress position with min - max A.  He was able to follow one step motor commands ~25% of the time.  He had periods of Lt gaze fixation and decreased responsiveness lasting 5-30 seconds throughout session.  He requires total A for all aspects of ADLs.  Goals and discharge recommendations have been updated do to decline in function.  At this time he will need either LTACH or SNF level rehab depending upon respiratory status.  Will follow acutely.     Follow Up Recommendations  SNF;LTACH    Equipment Recommendations  None recommended by OT    Recommendations for Other Services       Precautions / Restrictions Precautions Precautions: Fall Precaution Comments: trach, peg (placed after session), vent      Mobility Bed Mobility Overal bed mobility: Needs Assistance Bed Mobility: Supine to Sit;Sit to Supine           General bed mobility comments: utilized bed in foot egress to get pt to sitting with min assist to maintain sitting balance throughout with mod cues and tactile assist for positioning, total assist to scoot to [facility] end of session. Pt tolerated 12 min sitting at edge of  chair position with feet supported. Pt noted to have periodic left gaze for 3-10 sec and would then be able to snap back and track  Transfers                 General transfer comment: did not perform     Balance Overall balance assessment: Needs assistance Sitting-balance support: Feet supported;Single extremity supported Sitting balance-Leahy Scale: Poor Sitting balance - Comments: pt requires physical assist to maintain trunk in midline                                   ADL either performed or assessed with clinical judgement   ADL Overall ADL's : Needs assistance/impaired Eating/Feeding: NPO   Grooming: Wash/dry hands;Wash/dry face;Total assistance;Sitting;Bed level   Upper Body Bathing: Total assistance;Bed level   Lower Body Bathing: Total assistance;Bed level   Upper Body Dressing : Total assistance;Bed level   Lower Body Dressing: Total assistance;Bed level   Toilet Transfer: Total assistance Toilet Transfer Details (indicate cue type and reason): unable to attempt  Toileting- Clothing Manipulation and Hygiene: Total assistance;Bed level       Functional mobility during ADLs: Total assistance General ADL Comments: Pt unable to engage in ADL activities at this time      Vision   Additional Comments: Pt will look to therapist on both Lt and Rt.  He demonstrated periods of Lt gaze fixation with decreased responsiveness that lasted up to 5-30 seconds  Perception Perception Perception Tested?: No   Praxis Praxis Praxis-Other Comments: difficult to accurately assess     Pertinent Vitals/Pain Pain Assessment: Faces Pain Score: 0-No pain(shakes head no when asked if he is in pain ) Faces Pain Scale: No hurt     Hand Dominance Right   Extremity/Trunk Assessment Upper Extremity Assessment Upper Extremity Assessment: RUE deficits/detail;LUE deficits/detail RUE Deficits / Details: Pt wiith minimal active finger movement on command and  will bend elbow minimally.  H/o ataxia Rt UE per chart review  RUE Coordination: decreased fine motor;decreased gross motor LUE Deficits / Details: pt with minimal active finger movement on command, he will flex elbow minimally on command.  Pt with h/o Lt UE weakness from initial CVA LUE Coordination: decreased fine motor;decreased gross motor   Lower Extremity Assessment Lower Extremity Assessment: Defer to PT evaluation   Cervical / Trunk Assessment Cervical / Trunk Assessment: Other exceptions Cervical / Trunk Exceptions: Poor trunk control.  maintains lumber and thoracic flexion as well as head/neck flexed    Communication Communication Communication: Expressive difficulties;Tracheostomy;Receptive difficulties   Cognition Arousal/Alertness: Lethargic;Awake/alert Behavior During Therapy: Flat affect Overall Cognitive Status: Difficult to assess Area of Impairment: Following commands;Attention                   Current Attention Level: Focused   Following Commands: Follows one step commands inconsistently;Follows one step commands with increased time       General Comments: pt with partial nods during session but very inconsistent, pt with slight movement of RUE to command for placing hand on rail and lap   General Comments  BP initially 145/101; then 156/103 after sitting EOB for 12 mins      Exercises     Shoulder Instructions      Home Living Family/patient expects to be discharged to:: Private residence Living Arrangements: Spouse/significant other Available Help at Discharge: Available PRN/intermittently Type of Home: House Home Access: Stairs to enter Technical brewer of Steps: 3 Entrance Stairs-Rails: Right Home Layout: One level     Bathroom Shower/Tub: Teacher, early years/pre: Standard     Home Equipment: Environmental consultant - 2 wheels   Additional Comments: information gleaned from chart as pt unable to provide info and family not present        Prior Functioning/Environment Level of Independence: Needs assistance  Gait / Transfers Assistance Needed: was using a Rw to mobilize since previous stroke ADL's / Homemaking Assistance Needed: Pt unable to provide info    Comments: Pt unable to provide PLOF and family not present at time of eval        OT Problem List: Decreased strength;Decreased range of motion;Decreased activity tolerance;Impaired balance (sitting and/or standing);Impaired vision/perception;Decreased coordination;Decreased cognition;Decreased safety awareness;Decreased knowledge of use of DME or AE;Cardiopulmonary status limiting activity;Impaired tone;Impaired UE functional use      OT Treatment/Interventions: Self-care/ADL training;Therapeutic exercise;Energy conservation;DME and/or AE instruction;Therapeutic activities;Patient/family education    OT Goals(Current goals can be found in the care plan section) Acute Rehab OT Goals OT Goal Formulation: Patient unable to participate in goal setting Time For Goal Achievement: 12/21/17 Potential to Achieve Goals: Good ADL Goals Pt Will Perform Grooming: with mod assist;sitting Additional ADL Goal #1: Pt will sit EOB with min A x 15 mins in prep for ADLs Additional ADL Goal #2: Pt will follow one step motor commands 50% of the time in prep for ADLs  OT Frequency: Min 2X/week   Barriers to D/C: Decreased caregiver support  unsure  that family will be able to provide necessary level of care at discharge        Co-evaluation PT/OT/SLP Co-Evaluation/Treatment: Yes Reason for Co-Treatment: Complexity of the patient's impairments (multi-system involvement);For patient/therapist safety   OT goals addressed during session: Strengthening/ROM      AM-PAC PT "6 Clicks" Daily Activity     Outcome Measure Help from another person eating meals?: Total Help from another person taking care of personal grooming?: Total Help from another person toileting, which includes  using toliet, bedpan, or urinal?: Total Help from another person bathing (including washing, rinsing, drying)?: Total Help from another person to put on and taking off regular upper body clothing?: Total Help from another person to put on and taking off regular lower body clothing?: Total 6 Click Score: 6   End of Session Equipment Utilized During Treatment: Oxygen(Pt on vent )  Activity Tolerance: Patient limited by fatigue Patient left: in bed;with call Ivins/phone within reach;Other (comment)(MDs in room for PEG )  OT Visit Diagnosis: Muscle weakness (generalized) (M62.81);Other symptoms and signs involving cognitive function;Hemiplegia and hemiparesis Hemiplegia - Right/Left: Right Hemiplegia - dominant/non-dominant: Dominant Hemiplegia - caused by: Cerebral infarction                Time: 0071-2197 OT Time Calculation (min): 22 min Charges:  OT General Charges $OT Visit: 1 Visit OT Evaluation $OT Re-eval: 1 Re-eval G-Codes:     [facility], OTR/L [phone]   Ladonna Snide, Anissia Wessells M 12/07/2017, 5:22 PM

## 2017-12-07 NOTE — Progress Notes (Signed)
PULMONARY / CRITICAL CARE MEDICINE   Name: [name] MRN: [id number] DOB: [birth year]    ADMISSION DATE:  11/25/2017  CHIEF COMPLAINT: Altered mental status requiring intubation and mechanical ventilation  HISTORY OF PRESENT ILLNESS:   This is a 56 year old with a distant prior history of CVA related to cocaine use who presented on 1/13 with difficulty with speech.  He subsequently had difficulty managing his secretions and required intubation on the day of admission.  CT scan showed stenosis of M1 and M2 segments on the left and there was infarction in the coronal radiata.  He was extubated on 1/19 but developed stridor and required reintubation.       Percutaneous tracheostomy was performed yesterday.  He remains mechanically ventilated.  We are anticipating the placement of PEG tube later today.  He is showing much more spontaneous eye opening than before and is intermittently nodding to questions.    PAST MEDICAL HISTORY :  He  has a past medical history of Arthritis, Chest pain, Cocaine abuse ([place]), Diastolic dysfunction, Erectile dysfunction, Hyperlipidemia, Hypertension, Intracranial hemorrhage ([place]), Onychomycosis (April 2003), Prostate disorder, and Tinea versicolor.  PAST SURGICAL HISTORY: He  has a past surgical history that includes Colonoscopy with propofol (N/A, 05/22/2013).  No Known Allergies  No current facility-administered medications on file prior to encounter.    Current Outpatient Medications on File Prior to Encounter  Medication Sig  . amLODipine (NORVASC) 10 MG tablet Take 1 tablet (10 mg total) by mouth every morning.  Marland Kitchen aspirin 81 MG tablet Take 1 tablet (81 mg total) by mouth daily.  . hydrochlorothiazide (HYDRODIURIL) 25 MG tablet Take 1 tablet (25 mg total) by mouth every morning.  Marland Kitchen lisinopril (PRINIVIL,ZESTRIL) 20 MG tablet Take 1 tablet (20 mg total) by mouth daily.  . Multiple Vitamins-Minerals (HM MULTIVITAMIN ADULT GUMMY PO) Take 1 tablet by mouth  every morning.  Marland Kitchen atorvastatin (LIPITOR) 20 MG tablet Take 1 tablet (20 mg total) by mouth daily. (Patient not taking: Reported on 11/25/2017)    FAMILY HISTORY:  His indicated that his mother is alive. He indicated that his father is deceased.   SOCIAL HISTORY: He  reports that  has never smoked. he has never used smokeless tobacco. He reports that he drinks alcohol. He reports that he does not use drugs.  REVIEW OF SYSTEMS:   Not obtainable  SUBJECTIVE:  As above  VITAL SIGNS: BP 131/89   Pulse 73   Temp 98.9 F (37.2 C) (Axillary)   Resp 18   Ht 5\' 7"  (1.702 m)   Wt 164 lb 14.5 oz (74.8 kg)   SpO2 100%   BMI 25.83 kg/m   HEMODYNAMICS:    VENTILATOR SETTINGS: Vent Mode: PRVC FiO2 (%):  [30 %-60 %] 40 % Set Rate:  [18 bmp-28 bmp] 18 bmp Vt Set:  [530 mL] 530 mL PEEP:  [5 cmH20-8 cmH20] 5 cmH20 Plateau Pressure:  [15 cmH20-20 cmH20] 16 cmH20  INTAKE / OUTPUT: I/O last 3 completed shifts: In: 2850.8 [I.V.:1810; NG/GT:840.8; IV Piggyback:200] Out: 4100 [Urine:4100]  PHYSICAL EXAMINATION: General: He is resting comfortably in bed and mechanically ventilated via tracheostomy.    Neuro: He seems a little more alert today with spontaneous eye opening and a little quicker response with nods.  Pupils are equal, continues to have contraction/rigidity in the left upper extremity, and increased tone in the right upper extremity. Cardiovascular: S1 and S2 are regular without murmur rub or gallop Lungs: There is some blood in his tracheostomy  tube.  The tracheostomy site itself is dry.  He is not breathing above the set ventilator rate.  There is symmetric air movement, few scattered rhonchi, no wheezes  Abdomen: The abdomen is soft without any organomegaly masses tenderness guarding or rebound.  He is anicteric   LABS:  BMET Recent Labs  Lab 12/04/17 0336 12/05/17 0505 12/06/17 0310  NA 153* 150* 149*  K 3.6 3.9 4.0  CL 117* 114* 112*  CO2 24 24 24   BUN 39* 37* 39*   CREATININE 1.72* 1.59* 1.63*  GLUCOSE 125* 149* 141*    Electrolytes Recent Labs  Lab 12/02/17 0500 12/03/17 0629 12/04/17 0336 12/05/17 0505 12/06/17 0310  CALCIUM 8.9 8.4* 8.8* 9.3 9.3  PHOS 3.8 2.7 2.7  --   --     CBC Recent Labs  Lab 12/02/17 0500 12/03/17 0629 12/05/17 0505  WBC 5.6 6.6 7.4  HGB 11.6* 10.5* 11.6*  HCT 36.1* 33.2* 35.0*  PLT 205 208 232    Coag's Recent Labs  Lab 12/06/17 0310  INR 1.05    Sepsis Markers Recent Labs  Lab 12/05/17 0505 12/06/17 0310  PROCALCITON <0.10 <0.10    ABG Recent Labs  Lab 12/02/17 0500 12/05/17 0358 12/06/17 0524  PHART 7.445 7.423 7.465*  PCO2ART 39.3 41.9 38.5  PO2ART 103 114.0* 127*    Liver Enzymes Recent Labs  Lab 12/03/17 0629 12/04/17 0336 12/05/17 0505  AST  --   --  36  ALT  --   --  46  ALKPHOS  --   --  82  BILITOT  --   --  0.6  ALBUMIN 2.5* 2.6* 2.9*    Cardiac Enzymes No results for input(s): TROPONINI, PROBNP in the last 168 hours.  Glucose Recent Labs  Lab 12/06/17 0712 12/06/17 1125 12/06/17 1535 12/06/17 2256 12/07/17 0316 12/07/17 0736  GLUCAP 146* 155* 117* 117* 111* 115*    Imaging Dg Chest Port 1 View  Result Date: 12/06/2017 CLINICAL DATA:  Assess tracheostomy tube position. EXAM: PORTABLE CHEST 1 VIEW COMPARISON:  Chest x-ray from same day at 0600. FINDINGS: Interval placement of a tracheostomy tube with the tip in good position, approximately 5.2 cm above the level of the carina. Interval removal of the enteric tube. Interval placement of a right sided PICC line with the tip at the cavoatrial junction. Stable mild cardiomegaly. Normal pulmonary vascularity. No focal consolidation, pleural effusion, or pneumothorax. No acute osseous abnormality. IMPRESSION: 1. Appropriately positioned tracheostomy tube. 2. Interval placement of a right sided PICC line with the tip at the cavoatrial junction. No pneumothorax. Electronically Signed   By: Titus Dubin M.D.    On: 12/06/2017 16:53       DISCUSSION:     This is a very unfortunate 57 year old who is had a prior right brain CVA involving the motor strip who presented with a new stroke involving the left corona radiata rendering him without meaningful use of either upper extremity.  Difficulty maintaining his airway and failed extubation, percutaneous tracheostomy was performed on 1/23.  We are anticipating PEG tube placement on 1/24  ASSESSMENT / PLAN:  PULMONARY A: His most recent x-ray was on 120 and did not show significant parenchymal disease.  He does have a history of diastolic dysfunction but I was not impressed by the presence of CHF either.  I am anticipating separation from mechanical ventilation after sedation for PEG tube placement today has been metabolized.  CARDIOVASCULAR A: Continues Lipitor and aspirin for his stroke.  He is on Norvasc and lisinopril for blood pressure control.  A as needed dose of labetalol is in place  GASTROINTESTINAL A: GI prophylaxis is with [facility] [place] Pager: [phone]  12/07/2017, 9:07 AM

## 2017-12-07 NOTE — Progress Notes (Signed)
Subjective: On vent  Objective: Vital signs in last 24 hours: Temp:  [98.4 F (36.9 C)-99.5 F (37.5 C)] 98.9 F (37.2 C) (01/24 0800) Pulse Rate:  [46-125] 82 (01/24 0800) Resp:  [10-31] 17 (01/24 0800) BP: (123-180)/(81-131) 146/96 (01/24 0800) SpO2:  [80 %-100 %] 96 % (01/24 0800) FiO2 (%):  [30 %-60 %] 40 % (01/24 0800) Weight:  [74.8 kg (164 lb 14.5 oz)] 74.8 kg (164 lb 14.5 oz) (01/24 0500) Last BM Date: 12/04/17  Intake/Output from previous day: 01/23 0701 - 01/24 0700 In: 1630 [I.V.:1210; NG/GT:420] Out: 1750 [Urine:1750] Intake/Output this shift: Total I/O In: 50 [I.V.:50] Out: 280 [Urine:280]  General appearance: no distress Neck: trach Resp: clear to auscultation bilaterally Cardio: regular rate and rhythm GI: soft, NT  Lab Results: CBC  Recent Labs    12/05/17 0505  WBC 7.4  HGB 11.6*  HCT 35.0*  PLT 232   BMET Recent Labs    12/05/17 0505 12/06/17 0310  NA 150* 149*  K 3.9 4.0  CL 114* 112*  CO2 24 24  GLUCOSE 149* 141*  BUN 37* 39*  CREATININE 1.59* 1.63*  CALCIUM 9.3 9.3   PT/INR Recent Labs    12/06/17 0310  LABPROT 13.6  INR 1.05   ABG Recent Labs    12/05/17 0358 12/06/17 0524  PHART 7.423 7.465*  HCO3 27.3 27.2    Studies/Results: Dg Chest Port 1 View  Result Date: 12/06/2017 CLINICAL DATA:  Assess tracheostomy tube position. EXAM: PORTABLE CHEST 1 VIEW COMPARISON:  Chest x-ray from same day at 0600. FINDINGS: Interval placement of a tracheostomy tube with the tip in good position, approximately 5.2 cm above the level of the carina. Interval removal of the enteric tube. Interval placement of a right sided PICC line with the tip at the cavoatrial junction. Stable mild cardiomegaly. Normal pulmonary vascularity. No focal consolidation, pleural effusion, or pneumothorax. No acute osseous abnormality. IMPRESSION: 1. Appropriately positioned tracheostomy tube. 2. Interval placement of a right sided PICC line with the tip at  the cavoatrial junction. No pneumothorax. Electronically Signed   By: Titus Dubin M.D.   On: 12/06/2017 16:53   Dg Chest Port 1 View  Result Date: 12/06/2017 CLINICAL DATA:  Fevers EXAM: PORTABLE CHEST 1 VIEW COMPARISON:  12/03/2017 FINDINGS: Cardiac shadow is enlarged but stable. Endotracheal tube and nasogastric catheter are again noted and stable. The lungs are well aerated bilaterally. No focal infiltrate or sizable effusion is seen. No acute bony abnormality is noted. IMPRESSION: No acute abnormality noted. Electronically Signed   By: Inez Catalina M.D.   On: 12/06/2017 07:58    Anti-infectives: Anti-infectives (From admission, onward)   Start     Dose/Rate Route Frequency Ordered Stop   11/27/17 1000  vancomycin (VANCOCIN) IVPB 1000 mg/200 mL premix     1,000 mg 200 mL/hr over 60 Minutes Intravenous Every 24 hours 11/27/17 0945 12/01/17 1036   11/27/17 0945  piperacillin-tazobactam (ZOSYN) IVPB 3.375 g  Status:  Discontinued     3.375 g 100 mL/hr over 30 Minutes Intravenous Every 8 hours 11/27/17 0930 11/27/17 0942   11/27/17 0945  piperacillin-tazobactam (ZOSYN) IVPB 3.375 g     3.375 g 12.5 mL/hr over 240 Minutes Intravenous Every 8 hours 11/27/17 0942 12/01/17 2359      Assessment/Plan: For PEG a the bedside today. Consent obtained. Ancef X 1.  LOS: 11 days    Georganna Skeans, MD, MPH, FACS Trauma: [phone] General Surgery: [phone]  1/24/2019Patient ID: [name],  male   DOB: 1961-02-03, 57 y.o.   MRN: [id number]

## 2017-12-07 NOTE — Op Note (Signed)
[facility] Patient Name: [name] Procedure Date : 12/07/2017 MRN: [id number] Attending MD: Georganna Skeans , MD Date of Birth: 10/29/1961 CSN: [id number] Age: 57 Admit Type: Inpatient Procedure:                Upper GI endoscopy Indications:              Place PEG because patient is unable to eat due to                            stroke (CVA) Providers:                Georganna Skeans, MD; Jackson Latino, PAC; Tinnie Gens, Technician Referring MD:             Leonie Man Medicines:                Fentanyl 100 micrograms IV, Midazolam 2 mg IV Complications:            No immediate complications. Estimated Blood Loss:     Estimated blood loss was minimal. Procedure:                Pre-Anesthesia Assessment:                           - Prior to the procedure, a History and Physical                            was performed, and patient medications and                            allergies were reviewed. The patient is unable to                            give consent secondary to the patient's altered                            mental status. The risks and benefits of the                            procedure and the sedation options and risks were                            discussed with the patient's spouse. All questions                            were answered and informed consent was obtained.                            Patient identification and proposed procedure were                            verified by the physician, the nurse and the  technician in the procedure room. Mental Status                            Examination: lethargic. Respiratory Examination:                            clear to auscultation. CV Examination: regular rate                            and rhythm. ASA Grade Assessment: III - A patient                            with severe systemic disease. After reviewing the   risks and benefits, the patient was deemed in                            satisfactory condition to undergo the procedure.                            The anesthesia plan was to use moderate sedation /                            analgesia (conscious sedation). Immediately prior                            to administration of medications, the patient was                            re-assessed for adequacy to receive sedatives. The                            heart rate, respiratory rate, oxygen saturations,                            blood pressure, adequacy of pulmonary ventilation,                            and response to care were monitored throughout the                            procedure. The physical status of the patient was                            re-assessed after the procedure.                           After obtaining informed consent, the endoscope was                            passed under direct vision. Throughout the                            procedure, the patient's blood pressure, pulse, and  oxygen saturations were monitored continuously. The                            [id number] ([id number]) scope was introduced through the                            mouth, and advanced to the second part of duodenum.                            The upper GI endoscopy was somewhat difficult due                            to unusual anatomy. Successful completion of the                            procedure was aided by applying abdominal pressure. Scope In: Scope Out: Findings:      No gross lesions were noted [Site].      No gross lesions were noted [Site]. Placement of an externally removable       PEG with no T-fasteners was successfully completed. The external bumper       was at the 2.5 cm marking on the tube.      No gross lesions were noted in the second portion of the duodenum. Impression:               - No gross lesions in esophagus.                            - No gross lesions in the stomach.                           - No gross lesions in the second portion of the                            duodenum.                           - An externally removable PEG placement was                            successfully completed.                           - No specimens collected. Recommendation:           - Please follow the post-PEG recommendations. Procedure Code(s):        --- Professional ---                           [phone], Esophagogastroduodenoscopy, flexible,                            transoral; with directed placement of percutaneous                            gastrostomy tube Diagnosis Code(s):        ---  Professional ---                           J88.416, Other sequelae of cerebral infarction                           R63.3, Feeding difficulties                           Z43.1, Encounter for attention to gastrostomy CPT copyright 2016 [employer]. All rights reserved. The codes documented in this report are preliminary and upon coder review may  be revised to meet current compliance requirements. Georganna Skeans, MD 12/07/2017 12:04:14 PM This report has been signed electronically. Number of Addenda: 0

## 2017-12-07 NOTE — Progress Notes (Addendum)
Physical Therapy Treatment Patient Details Name: [name] MRN: [id number] DOB: 04-07-1961 Today's Date: 12/07/2017    History of Present Illness 57 y.o.malewith hx of previous strokes, polysubstance abuse, HLD, HTN, right basal ganglia hemorrhage in 1999, and left basal ganglia hemorrhage 2005, presenting with episodicaphasia vs. dysarthria.Hedid not receive IV t-PA due to initial improvement in deficits and late presentation. 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 Patient developed severe dysphagia and was intubated on 1/13, trach 1/23, PEG 1/24    PT Comments    Pt with flat affect with focused to sustained attention. Pt with slight improvement in functional status with ability to follow commands partially with RUE and tolerate sitting EOB. Pt with periods of left gaze fixation throughout session. Pt with significant decline since initial eval and goals and D/C plan updated.  BP sitting initially 145/101 After 12 min sitting 156/103    Follow Up Recommendations  Supervision/Assistance - 24 hour;SNF     Equipment Recommendations  Wheelchair (measurements PT);Wheelchair cushion (measurements PT)    Recommendations for Other Services       Precautions / Restrictions Precautions Precautions: Fall Precaution Comments: trach, peg (placed after session), vent Restrictions Weight Bearing Restrictions: No    Mobility  Bed Mobility Overal bed mobility: Needs Assistance Bed Mobility: Supine to Sit;Sit to Supine     Supine to sit: Max assist;+2 for safety/equipment Sit to supine: Max assist;+2 for safety/equipment   General bed mobility comments: utilized bed in foot egress to get pt to sitting with min assist to maintain sitting balance throughout with mod cues and tactile assist for positioning, total assist to scoot to [facility] end of session. Pt tolerated 12 min sitting at edge of chair position  with feet supported. Pt noted to have periodic left gaze for 3-10 sec and would then be able to snap back and track  Transfers                 General transfer comment: did not perform due to increased trach secretions and pending peg placement  Ambulation/Gait             General Gait Details: unable to perform at this time   Stairs            Wheelchair Mobility    Modified Rankin (Stroke Patients Only) Modified Rankin (Stroke Patients Only) Pre-Morbid Rankin Score: Moderate disability Modified Rankin: Severe disability     Balance Overall balance assessment: Needs assistance Sitting-balance support: Feet supported;Single extremity supported Sitting balance-Leahy Scale: Poor Sitting balance - Comments: pt requires physical assist to maintain trunk in midline                                    Cognition Arousal/Alertness: Lethargic Behavior During Therapy: Flat affect Overall Cognitive Status: Difficult to assess Area of Impairment: Following commands;Attention                   Current Attention Level: Focused   Following Commands: Follows one step commands inconsistently;Follows one step commands with increased time       General Comments: pt with partial nods during session but very inconsistent, pt with slight movement of RUE to command for placing hand on rail and lap      Exercises      General Comments        Pertinent Vitals/Pain Pain  Assessment: (CPOT= 0)    Home Living                      Prior Function            PT Goals (current goals can now be found in the care plan section) Acute Rehab PT Goals Time For Goal Achievement: 12/21/17 Potential to Achieve Goals: Fair Progress towards PT goals: Goals downgraded-see care plan(goals downgraded to reflect change in medical status from initial eval)    Frequency    Min 3X/week      PT Plan Discharge plan needs to be updated;Frequency  needs to be updated    Co-evaluation PT/OT/SLP Co-Evaluation/Treatment: Yes Reason for Co-Treatment: Complexity of the patient's impairments (multi-system involvement);For patient/therapist safety PT goals addressed during session: Mobility/safety with mobility;Balance        AM-PAC PT "6 Clicks" Daily Activity  Outcome Measure  Difficulty turning over in bed (including adjusting bedclothes, sheets and blankets)?: Unable Difficulty moving from lying on back to sitting on the side of the bed? : Unable Difficulty sitting down on and standing up from a chair with arms (e.g., wheelchair, bedside commode, etc,.)?: Unable Help needed moving to and from a bed to chair (including a wheelchair)?: Total Help needed walking in hospital room?: Total Help needed climbing 3-5 steps with a railing? : Total 6 Click Score: 6    End of Session   Activity Tolerance: Patient tolerated treatment well Patient left: in bed;Other (comment)(with MD present for PEG placement) Nurse Communication: Mobility status;Need for lift equipment PT Visit Diagnosis: Other symptoms and signs involving the nervous system (R29.898);Other abnormalities of gait and mobility (R26.89);Muscle weakness (generalized) (M62.81)     Time: 9518-8416 PT Time Calculation (min) (ACUTE ONLY): 23 min  Charges:  $Therapeutic Activity: 8-22 mins                    G Codes:       [name], PT [phone]    Meili Kleckley B Valentino Saavedra 12/07/2017, 11:48 AM

## 2017-12-08 ENCOUNTER — Encounter (HOSPITAL_COMMUNITY): Payer: Medicare HMO

## 2017-12-08 LAB — GLUCOSE, CAPILLARY
GLUCOSE-CAPILLARY: 132 mg/dL — AB (ref 65–99)
GLUCOSE-CAPILLARY: 119 mg/dL — AB (ref 65–99)
Glucose-Capillary: 119 mg/dL — ABNORMAL HIGH (ref 65–99)
Glucose-Capillary: 144 mg/dL — ABNORMAL HIGH (ref 65–99)
Glucose-Capillary: 122 mg/dL — ABNORMAL HIGH (ref 65–99)
Glucose-Capillary: 140 mg/dL — ABNORMAL HIGH (ref 65–99)

## 2017-12-08 LAB — BASIC METABOLIC PANEL
Anion gap: 9 (ref 5–15)
BUN: 36 mg/dL — AB (ref 6–20)
CO2: 25 mmol/L (ref 22–32)
CREATININE: 1.67 mg/dL — AB (ref 0.61–1.24)
Calcium: 8.4 mg/dL — ABNORMAL LOW (ref 8.9–10.3)
Chloride: 115 mmol/L — ABNORMAL HIGH (ref 101–111)
GFR calc Af Amer: 52 mL/min — ABNORMAL LOW (ref >60)
GFR, EST NON AFRICAN AMERICAN: 45 mL/min — AB (ref >60)
Glucose, Bld: 273 mg/dL — ABNORMAL HIGH (ref 65–99)
Potassium: 3.4 mmol/L — ABNORMAL LOW (ref 3.5–5.1)
SODIUM: 149 mmol/L — AB (ref 135–145)

## 2017-12-08 MED ORDER — JEVITY 1.2 CAL PO LIQD
1000.0000 mL | ORAL | Status: DC
  Administered 2017-12-08 – 2017-12-15 (×9): 1000 mL
  Administered 2017-12-15: 65 mL/h
  Administered 2017-12-16 – 2017-12-20 (×5): 1000 mL
  Filled 2017-12-08 (×27): qty 1000

## 2017-12-08 MED ORDER — PRO-STAT SUGAR FREE PO LIQD
30.0000 mL | Freq: Every day | ORAL | Status: DC
  Administered 2017-12-08 – 2017-12-22 (×15): 30 mL
  Filled 2017-12-08 (×16): qty 30

## 2017-12-08 MED ORDER — CHLORHEXIDINE GLUCONATE 0.12 % MT SOLN
15.0000 mL | Freq: Two times a day (BID) | OROMUCOSAL | Status: DC
  Administered 2017-12-09 – 2017-12-22 (×28): 15 mL via OROMUCOSAL
  Filled 2017-12-08 (×24): qty 15

## 2017-12-08 MED ORDER — ORAL CARE MOUTH RINSE
15.0000 mL | Freq: Two times a day (BID) | OROMUCOSAL | Status: DC
  Administered 2017-12-11 – 2017-12-22 (×19): 15 mL via OROMUCOSAL

## 2017-12-08 NOTE — Progress Notes (Signed)
PULMONARY / CRITICAL CARE MEDICINE   Name: [name] MRN: [id number] DOB: 06/24/1961    ADMISSION DATE:  11/25/2017  CHIEF COMPLAINT: Altered mental status requiring intubation and mechanical ventilation  HISTORY OF PRESENT ILLNESS:   This is a 57 year old with a distant prior history of CVA related to cocaine use who presented on 1/13 with difficulty with speech.  He subsequently had difficulty managing his secretions and required intubation on the day of admission.  CT scan showed stenosis of M1 and M2 segments on the left and there was infarction in the coronal radiata.  He was extubated on 1/19 but developed stridor and required reintubation.       Percutaneous tracheostomy was performed 1/23 and PEG tube was placed on 1/24.  He was placed on a trach collar yesterday afternoon and this has been well-tolerated overnight.  He has continuous eye opening but is not at all interactive.      PAST MEDICAL HISTORY :  He  has a past medical history of Arthritis, Chest pain, Cocaine abuse ([place]), Diastolic dysfunction, Erectile dysfunction, Hyperlipidemia, Hypertension, Intracranial hemorrhage ([place]), Onychomycosis (April 2003), Prostate disorder, and Tinea versicolor.  PAST SURGICAL HISTORY: He  has a past surgical history that includes Colonoscopy with propofol (N/A, 05/22/2013).  No Known Allergies  No current facility-administered medications on file prior to encounter.    Current Outpatient Medications on File Prior to Encounter  Medication Sig  . amLODipine (NORVASC) 10 MG tablet Take 1 tablet (10 mg total) by mouth every morning.  Marland Kitchen aspirin 81 MG tablet Take 1 tablet (81 mg total) by mouth daily.  . hydrochlorothiazide (HYDRODIURIL) 25 MG tablet Take 1 tablet (25 mg total) by mouth every morning.  Marland Kitchen lisinopril (PRINIVIL,ZESTRIL) 20 MG tablet Take 1 tablet (20 mg total) by mouth daily.  . Multiple Vitamins-Minerals (HM MULTIVITAMIN ADULT GUMMY PO) Take 1 tablet by mouth every morning.   Marland Kitchen atorvastatin (LIPITOR) 20 MG tablet Take 1 tablet (20 mg total) by mouth daily. (Patient not taking: Reported on 11/25/2017)    FAMILY HISTORY:  His indicated that his mother is alive. He indicated that his father is deceased.   SOCIAL HISTORY: He  reports that  has never smoked. he has never used smokeless tobacco. He reports that he drinks alcohol. He reports that he does not use drugs.  REVIEW OF SYSTEMS:   Not obtainable  SUBJECTIVE:  As above  VITAL SIGNS: BP 119/80   Pulse 94   Temp 99 F (37.2 C) (Axillary)   Resp 19   Ht 5\' 7"  (1.702 m)   Wt 165 lb 9.1 oz (75.1 kg)   SpO2 100%   BMI 25.93 kg/m   HEMODYNAMICS:    VENTILATOR SETTINGS: Vent Mode: PRVC FiO2 (%):  [40 %] 40 % Set Rate:  [18 bmp] 18 bmp Vt Set:  [530 mL] 530 mL PEEP:  [5 cmH20] 5 cmH20 Plateau Pressure:  [16 cmH20-19 cmH20] 19 cmH20  INTAKE / OUTPUT: I/O last 3 completed shifts: In: 2644 [I.V.:1840; NG/GT:804] Out: 3445 [Urine:3445]  PHYSICAL EXAMINATION: General: He is resting comfortably in bed     Neuro: He has spontaneous eye opening but does not track and does not follow instructions for me today.   Cardiovascular: S1 and S2 are regular without murmur rub or gallop Lungs: Respirations are unlabored on a tracheostomy collar.  There is symmetric air movement, there are no wheezes, there are a great deal transmitted upper airway noises.  The tracheostomy site itself is  dry.   Abdomen: The abdomen is in a binder following PEG tube placement, and is soft without any organomegaly masses tenderness guarding or rebound.  The PEG tube dressing is dry.  He is anicteric   LABS:  BMET Recent Labs  Lab 12/05/17 0505 12/06/17 0310 12/08/17 0448  NA 150* 149* 149*  K 3.9 4.0 3.4*  CL 114* 112* 115*  CO2 24 24 25   BUN 37* 39* 36*  CREATININE 1.59* 1.63* 1.67*  GLUCOSE 149* 141* 273*    Electrolytes Recent Labs  Lab 12/02/17 0500 12/03/17 0629 12/04/17 0336 12/05/17 0505  12/06/17 0310 12/08/17 0448  CALCIUM 8.9 8.4* 8.8* 9.3 9.3 8.4*  PHOS 3.8 2.7 2.7  --   --   --     CBC Recent Labs  Lab 12/02/17 0500 12/03/17 0629 12/05/17 0505  WBC 5.6 6.6 7.4  HGB 11.6* 10.5* 11.6*  HCT 36.1* 33.2* 35.0*  PLT 205 208 232    Coag's Recent Labs  Lab 12/06/17 0310  INR 1.05    Sepsis Markers Recent Labs  Lab 12/05/17 0505 12/06/17 0310 12/07/17 0635  PROCALCITON <0.10 <0.10 <0.10    ABG Recent Labs  Lab 12/02/17 0500 12/05/17 0358 12/06/17 0524  PHART 7.445 7.423 7.465*  PCO2ART 39.3 41.9 38.5  PO2ART 103 114.0* 127*    Liver Enzymes Recent Labs  Lab 12/03/17 0629 12/04/17 0336 12/05/17 0505  AST  --   --  36  ALT  --   --  46  ALKPHOS  --   --  82  BILITOT  --   --  0.6  ALBUMIN 2.5* 2.6* 2.9*    Cardiac Enzymes No results for input(s): TROPONINI, PROBNP in the last 168 hours.  Glucose Recent Labs  Lab 12/07/17 1312 12/07/17 1540 12/07/17 1922 12/07/17 2309 12/08/17 0312 12/08/17 0752  GLUCAP 110* 107* 129* 97 132* 119*    Imaging No results found.     DISCUSSION:     This is a very unfortunate 57 year old who is had a prior right brain CVA involving the motor strip who presented with a new stroke involving the left corona radiata rendering him without meaningful use of either upper extremity.  Difficulty maintaining his airway and failed extubation, percutaneous tracheostomy was performed on 1/23.  PEG tube was placed  on 1/24  ASSESSMENT / PLAN:  PULMONARY A: His most recent x-ray was on 1/20 and did not show significant parenchymal disease.  He does have a history of diastolic dysfunction but I was not impressed by the presence of CHF either.  He is tolerated separation from mechanical ventilation following tracheostomy tube placement and I am anticipating transfer to stepdown later today.    CARDIOVASCULAR A: Continues Lipitor and aspirin for his stroke.  He is on Norvasc and lisinopril for blood pressure  control.  A as needed dose of labetalol is in place  GASTROINTESTINAL A: GI prophylaxis is with Pepcid  Endocrine He is somewhat more glucose intolerant today.  He continues on sliding scale insulin, whether the higher sugars are due to the stress of his recent procedures a we have another process in place is not clear if he will require further observation.  Mr. Beaulieu is safe for transfer to the stepdown unit today, the critical care service will not routinely follow please reconsult [place] if needed.     Oneita Hurt Pulmonary and [place] Pager: [phone]  12/08/2017, 8:23 AM

## 2017-12-08 NOTE — Progress Notes (Signed)
Nutrition Follow-up  INTERVENTION:   Jevity 1.2 @ 65 ml/hr (1560 ml/day) 30 ml Prostat daily  Provides: 1972 kcal, 101 grams protein, and 1265 ml free water.    NUTRITION DIAGNOSIS:   Inadequate oral intake related to inability to eat as evidenced by NPO status. Ongoing.   GOAL:   Patient will meet greater than or equal to 90% of their needs Progressing.   MONITOR:   TF tolerance, I & O's  ASSESSMENT:   Pt with PMH of HTN, CHF, hyperlipidemia, polysubstance abuse, CKD stage II, and multiple CVA. Pt admitted with stroke. Asarthria and dysphagia likely due to pseudobulbar etiology. Head CT showed possible acute left basal ganglia infarct. Emergently intubated 11/26/17 to protect airway due to difficulty managing secretions.  Pt discussed during ICU rounds and with RN.  1/23 trach, now on trach collar 24hrs 1/24 PEG  No pressors, no propofol  Medications reviewed and include: colace, senokot Labs reviewed: Na 149 (H), K+ 3.4 (L) CBG's: [phone]  TF: Vital 1.2 at goal rate of 69ml/hr- Regimen provides 1728kcal/day, 108g/day protein, and 1123ml free water    Diet Order:  Fall precautions Diet NPO time specified  EDUCATION NEEDS:   Not appropriate for education at this time  Skin:  Skin Assessment: Reviewed RN Assessment  Last BM:  1/23  Height:   Ht Readings from Last 1 Encounters:  11/25/17 5\' 7"  (1.702 m)    Weight:   Wt Readings from Last 1 Encounters:  12/08/17 165 lb 9.1 oz (75.1 kg)    Ideal Body Weight:  67.3 kg  BMI:  Body mass index is 25.93 kg/m.  Estimated Nutritional Needs:   Kcal:  1900-2100  Protein:  90-115 grams  Fluid:  > 1.9 L/day  Maylon Peppers RD, LDN, CNSC [phone] Pager [phone] After Hours Pager

## 2017-12-08 NOTE — Progress Notes (Signed)
[facility]/Trauma Progress Note  1 Day Post-Op   Assessment/Plan Principal Problem:   Stroke [facility]) Active Problems:   Essential hypertension, benign   History of completed stroke   Microcytic anemia   Aphasia   Dysarthria   Oropharyngeal dysphagia   Bulbar palsy (HCC)   Aspiration pneumonia (HCC)   Endotracheally intubated   Acute kidney injury ([place])   Atelectasis, bilateral   Encounter for nasogastric (NG) tube placement   Postextubation stridor   Acute respiratory failure with hypoxia (HCC)  PEG - S/P PEG placement, Dr. Grandville Silos, 01/24 - okay for tube feeds to goal    DISPO: PEG site looks good and pt is tolerating TF's. Trauma will sign off at this time. Please page [place] with any further needs.     LOS: 12 days    Subjective:  CC: S/P PEG placement  Pt nonverbal. Alert. Shook head no when asked if he had any abdominal pain.  Objective: Vital signs in last 24 hours: Temp:  [98.6 F (37 C)-99.6 F (37.6 C)] 99.6 F (37.6 C) (01/25 0800) Pulse Rate:  [9-106] 9 (01/25 0839) Resp:  [16-28] 22 (01/25 0839) BP: (117-156)/(76-103) 137/93 (01/25 0839) SpO2:  [94 %-100 %] 100 % (01/25 0839) FiO2 (%):  [40 %] 40 % (01/25 0839) Weight:  [165 lb 9.1 oz (75.1 kg)] 165 lb 9.1 oz (75.1 kg) (01/25 0500) Last BM Date: 12/06/17  Intake/Output from previous day: 01/24 0701 - 01/25 0700 In: 2094 [I.V.:1230; NG/GT:864] Out: 2295 [Urine:2295] Intake/Output this shift: Total I/O In: 110 [I.V.:50; NG/GT:60] Out: 125 [Urine:125]  PE: Gen:  Alert, NAD Pulm:  Trach in place Abd: Soft, NT/ND, +BS, binder in place, PEG site looks good and is without bleeding or drainage Skin: no rashes noted, warm and dry   Anti-infectives: Anti-infectives (From admission, onward)   Start     Dose/Rate Route Frequency Ordered Stop   12/07/17 1100  ceFAZolin (ANCEF) IVPB 2g/100 mL premix     2 g 200 mL/hr over 30 Minutes Intravenous  Once 12/07/17 0847 12/07/17 1145   11/27/17 1000  vancomycin (VANCOCIN) IVPB 1000 mg/200 mL premix     1,000 mg 200 mL/hr over 60 Minutes Intravenous Every 24 hours 11/27/17 0945 12/01/17 1036   11/27/17 0945  piperacillin-tazobactam (ZOSYN) IVPB 3.375 g  Status:  Discontinued     3.375 g 100 mL/hr over 30 Minutes Intravenous Every 8 hours 11/27/17 0930 11/27/17 0942   11/27/17 0945  piperacillin-tazobactam (ZOSYN) IVPB 3.375 g     3.375 g 12.5 mL/hr over 240 Minutes Intravenous Every 8 hours 11/27/17 0942 12/01/17 2359      Lab Results:  No results for input(s): WBC, HGB, HCT, PLT in the last 72 hours. BMET Recent Labs    12/06/17 0310 12/08/17 0448  NA 149* 149*  K 4.0 3.4*  CL 112* 115*  CO2 24 25  GLUCOSE 141* 273*  BUN 39* 36*  CREATININE 1.63* 1.67*  CALCIUM 9.3 8.4*   PT/INR Recent Labs    12/06/17 0310  LABPROT 13.6  INR 1.05   CMP     Component Value Date/Time   NA 149 (H) 12/08/2017 0448   NA 138 07/04/2017 1648   K 3.4 (L) 12/08/2017 0448   CL 115 (H) 12/08/2017 0448   CO2 25 12/08/2017 0448   GLUCOSE 273 (H) 12/08/2017 0448   BUN 36 (H) 12/08/2017 0448   BUN 16 07/04/2017 1648   CREATININE 1.67 (H) 12/08/2017 0448   CREATININE 1.08  02/13/2015 1117   CALCIUM 8.4 (L) 12/08/2017 0448   PROT 6.8 12/05/2017 0505   PROT 7.8 05/29/2017 1537   ALBUMIN 2.9 (L) 12/05/2017 0505   ALBUMIN 4.4 05/29/2017 1537   AST 36 12/05/2017 0505   ALT 46 12/05/2017 0505   ALKPHOS 82 12/05/2017 0505   BILITOT 0.6 12/05/2017 0505   BILITOT 0.4 05/29/2017 1537   GFRNONAA 45 (L) 12/08/2017 0448   GFRNONAA 78 02/13/2015 1117   GFRAA 52 (L) 12/08/2017 0448   GFRAA >89 02/13/2015 1117   Lipase  No results found for: LIPASE  Studies/Results: Dg Chest Port 1 View  Result Date: 12/06/2017 CLINICAL DATA:  Assess tracheostomy tube position. EXAM: PORTABLE CHEST 1 VIEW COMPARISON:  Chest x-ray from same day at 0600. FINDINGS: Interval placement of a tracheostomy tube with the tip in good position,  approximately 5.2 cm above the level of the carina. Interval removal of the enteric tube. Interval placement of a right sided PICC line with the tip at the cavoatrial junction. Stable mild cardiomegaly. Normal pulmonary vascularity. No focal consolidation, pleural effusion, or pneumothorax. No acute osseous abnormality. IMPRESSION: 1. Appropriately positioned tracheostomy tube. 2. Interval placement of a right sided PICC line with the tip at the cavoatrial junction. No pneumothorax. Electronically Signed   By: Titus Dubin M.D.   On: 12/06/2017 16:53      Kalman Drape , [facility] 12/08/2017, 8:56 AM Pager: [phone] Consults: [phone] Mon-Fri 7:00 am-4:30 pm Sat-Sun 7:00 am-11:30 am

## 2017-12-09 LAB — BASIC METABOLIC PANEL
ANION GAP: 12 (ref 5–15)
BUN: 32 mg/dL — ABNORMAL HIGH (ref 6–20)
CALCIUM: 8.9 mg/dL (ref 8.9–10.3)
CO2: 25 mmol/L (ref 22–32)
Chloride: 114 mmol/L — ABNORMAL HIGH (ref 101–111)
Creatinine, Ser: 1.48 mg/dL — ABNORMAL HIGH (ref 0.61–1.24)
GFR calc non Af Amer: 52 mL/min — ABNORMAL LOW (ref >60)
GFR, EST AFRICAN AMERICAN: 60 mL/min — AB (ref >60)
Glucose, Bld: 147 mg/dL — ABNORMAL HIGH (ref 65–99)
Potassium: 3.6 mmol/L (ref 3.5–5.1)
Sodium: 151 mmol/L — ABNORMAL HIGH (ref 135–145)

## 2017-12-09 LAB — CBC WITH DIFFERENTIAL/PLATELET
BASOS ABS: 0 10*3/uL (ref 0.0–0.1)
Basophils Relative: 0 %
Eosinophils Absolute: 0.3 10*3/uL (ref 0.0–0.7)
Eosinophils Relative: 3 %
HCT: 34.5 % — ABNORMAL LOW (ref 39.0–52.0)
Hemoglobin: 10.9 g/dL — ABNORMAL LOW (ref 13.0–17.0)
LYMPHS ABS: 1 10*3/uL (ref 0.7–4.0)
Lymphocytes Relative: 10 %
MCH: 22.8 pg — ABNORMAL LOW (ref 26.0–34.0)
MCHC: 31.6 g/dL (ref 30.0–36.0)
MCV: 72 fL — ABNORMAL LOW (ref 78.0–100.0)
MONO ABS: 0.9 10*3/uL (ref 0.1–1.0)
Monocytes Relative: 9 %
Neutro Abs: 8 10*3/uL — ABNORMAL HIGH (ref 1.7–7.7)
Neutrophils Relative %: 78 %
PLATELETS: 288 10*3/uL (ref 150–400)
RBC: 4.79 MIL/uL (ref 4.22–5.81)
RDW: 14.6 % (ref 11.5–15.5)
WBC: 10.2 10*3/uL (ref 4.0–10.5)

## 2017-12-09 LAB — GLUCOSE, CAPILLARY
Glucose-Capillary: 140 mg/dL — ABNORMAL HIGH (ref 65–99)
Glucose-Capillary: 133 mg/dL — ABNORMAL HIGH (ref 65–99)
Glucose-Capillary: 107 mg/dL — ABNORMAL HIGH (ref 65–99)
Glucose-Capillary: 130 mg/dL — ABNORMAL HIGH (ref 65–99)
Glucose-Capillary: 128 mg/dL — ABNORMAL HIGH (ref 65–99)

## 2017-12-09 MED ORDER — FREE WATER
200.0000 mL | Freq: Four times a day (QID) | Status: DC
  Administered 2017-12-09 – 2017-12-16 (×28): 200 mL

## 2017-12-09 NOTE — Progress Notes (Addendum)
PROGRESS NOTE    [name]  DQQ:[id number] DOB: Jul 31, 1961 DOA: 11/25/2017 PCP: Tawny Asal, MD   Brief Narrative: [name] is a 57 y.o. male with medical history significant for hypertension, hyperlipidemia, polysubstance abuse, and history of CVAs with residual weakness. Patient presented with speech difficulties and found to have multiple CVAs. Patient required intubation for airway protection. He was unable to successfully be extubated and trach was placed. During his hospitalization, he was treated for aspiration pneumonia. PEG tube placed on 1/25. Plan for discharge to SNF/LTAC. Currently off vent.   Assessment & Plan:   Principal Problem:   Stroke [facility]) Active Problems:   Essential hypertension, benign   History of completed stroke   Microcytic anemia   Aphasia   Dysarthria   Oropharyngeal dysphagia   Bulbar palsy (HCC)   Aspiration pneumonia (HCC)   Endotracheally intubated   Acute kidney injury ([place])   Atelectasis, bilateral   Encounter for nasogastric (NG) tube placement   Postextubation stridor   Acute respiratory failure with hypoxia ([place])   CVA History of stroke New this admission are left basal ganglia and small corona radiata infarcts. Evaluated by stroke team earlier in admission. LDL of 251, hemoglobin A1C of 5.7%, Transthoracic Echocardiogram significant for an EF of 60-65%.  -Continue Aspirin -Continue Lipitor  Tracheostomy Patient unable to tolerate extubation secondary to laryngeal edema and persistent stridor. Tracheostomy performed on 1/23. Patient on trach collar.  Aspiration pneumonia Treated with Zosyn and vancomycin. Course completed.  Acute respiratory failure with hypoxia Secondary to pseudobulbar etiology. Patient intubated from 1/13 to 1/18 then reintubated from 1/19 to 1/23. Trach performed on 1/23. Currently on trach collar. -PCCM recommendations  Chronic diastolic heart failure Grade 1 diastolic dysfunction mentioned in  2014 echocardiogram. No mention in 2019 echocardiogram. Normal EF.  Essential hypertension -Continue amlodipine and lisinopril -hydrochlorothiazide held on admission  Acute kidney injury Improved. Not at baseline.  Fever Transient. -Blood cultures  Hypernatremia In setting of tube feeds -add 200 ml free water flushes QID -Increase IV fluids to 75 ml/hr -repeat BMP   DVT prophylaxis: Heparin subq Code Status: Full code Family Communication: None at bedside Disposition Plan: SNF vs LTAC when medically stable   Consultants:   Neurology  Critical care  General surgery  Procedures:   Echocardiogram (11/27/17) Study Conclusions  - Left ventricle: The cavity size was normal. Systolic function was   normal. The estimated ejection fraction was in the range of 60%   to 65%. Wall motion was normal; there were no regional wall   motion abnormalities. - Aortic valve: There was mild regurgitation. - Atrial septum: No defect or patent foramen ovale was identified. - Pulmonary arteries: PA peak pressure: 32 mm Hg (S).   Tracheostomy (1/23)  ETT (1/13>>1/18; 1/19>>1/23)  Antimicrobials:  Zosyn (1/14>>1/18)  Vancomycin (1/14>>1/18)    Subjective: Mild fever overnight.   Objective: Vitals:   12/09/17 0900 12/09/17 1100 12/09/17 1300 12/09/17 1400  BP: (!) 177/92 (!) 156/101 121/89 (!) 142/95  Pulse: (!) 115 92 84 98  Resp: 19 19 18 17   Temp:  99 F (37.2 C)    TempSrc:  Oral    SpO2: 98% 97% 100% 99%  Weight:      Height:        Intake/Output Summary (Last 24 hours) at 12/09/2017 1500 Last data filed at 12/09/2017 1353 Gross per 24 hour  Intake 2066.83 ml  Output 1925 ml  Net 141.83 ml   [employer]  12/08/17 0500 12/08/17 2330 12/09/17 0500  Weight: 75.1 kg (165 lb 9.1 oz) 77.6 kg (171 lb 1.2 oz) 77.6 kg (171 lb 1.2 oz)    Examination:  General exam: Appears calm and comfortable Respiratory system: Rhonchi. Respiratory effort  normal. Cardiovascular system: S1 & S2 heard, RRR. No murmurs, rubs, gallops or clicks. Gastrointestinal system: Abdomen is nondistended, soft and nontender.  Normal bowel sounds heard. Central nervous system: Alert. Extremities: No calf tenderness Skin: No cyanosis. No rashes    Data Reviewed: I have personally reviewed following labs and imaging studies  CBC: Recent Labs  Lab 12/03/17 0629 12/05/17 0505 12/09/17 0450  WBC 6.6 7.4 10.2  NEUTROABS 4.6 6.5 8.0*  HGB 10.5* 11.6* 10.9*  HCT 33.2* 35.0* 34.5*  MCV 72.0* 72.2* 72.0*  PLT 208 232 458   Basic Metabolic Panel: Recent Labs  Lab 12/03/17 0629 12/04/17 0336 12/05/17 0505 12/06/17 0310 12/08/17 0448 12/09/17 0450  NA 151* 153* 150* 149* 149* 151*  K 3.8 3.6 3.9 4.0 3.4* 3.6  CL 117* 117* 114* 112* 115* 114*  CO2 24 24 24 24 25 25   GLUCOSE 131* 125* 149* 141* 273* 147*  BUN 45* 39* 37* 39* 36* 32*  CREATININE 1.84* 1.72* 1.59* 1.63* 1.67* 1.48*  CALCIUM 8.4* 8.8* 9.3 9.3 8.4* 8.9  PHOS 2.7 2.7  --   --   --   --    GFR: Estimated Creatinine Clearance: 52.7 mL/min (A) (by C-G formula based on SCr of 1.48 mg/dL (H)). Liver Function Tests: Recent Labs  Lab 12/03/17 0629 12/04/17 0336 12/05/17 0505  AST  --   --  36  ALT  --   --  46  ALKPHOS  --   --  82  BILITOT  --   --  0.6  PROT  --   --  6.8  ALBUMIN 2.5* 2.6* 2.9*   No results for input(s): LIPASE, AMYLASE in the last 168 hours. No results for input(s): AMMONIA in the last 168 hours. Coagulation Profile: Recent Labs  Lab 12/06/17 0310  INR 1.05   Cardiac Enzymes: No results for input(s): CKTOTAL, CKMB, CKMBINDEX, TROPONINI in the last 168 hours. BNP (last 3 results) No results for input(s): PROBNP in the last 8760 hours. HbA1C: No results for input(s): HGBA1C in the last 72 hours. CBG: Recent Labs  Lab 12/08/17 2032 12/08/17 2327 12/09/17 0427 12/09/17 0729 12/09/17 1128  GLUCAP 140* 119* 140* 133* 107*   Lipid Profile: No  results for input(s): CHOL, HDL, LDLCALC, TRIG, CHOLHDL, LDLDIRECT in the last 72 hours. Thyroid Function Tests: No results for input(s): TSH, T4TOTAL, FREET4, T3FREE, THYROIDAB in the last 72 hours. Anemia Panel: No results for input(s): VITAMINB12, FOLATE, FERRITIN, TIBC, IRON, RETICCTPCT in the last 72 hours. Sepsis Labs: Recent Labs  Lab 12/05/17 0505 12/06/17 0310 12/07/17 0635  PROCALCITON <0.10 <0.10 <0.10    No results found for this or any previous visit (from the past 240 hour(s)).       Radiology Studies: No results found.      Scheduled Meds: . amLODipine  10 mg Per Tube q morning - 10a  . aspirin  325 mg Per Tube Daily  . atorvastatin  80 mg Per Tube q1800  . baclofen  5 mg Oral BID  . chlorhexidine  15 mL Mouth Rinse BID  . docusate  100 mg Per Tube BID  . feeding supplement (PRO-STAT SUGAR FREE 64)  30 mL Per Tube Daily  . heparin injection (subcutaneous)  5,000 Units Subcutaneous Q8H  . insulin aspart  0-9 Units Subcutaneous Q4H  . lisinopril  20 mg Per Tube BID  . mouth rinse  15 mL Mouth Rinse q12n4p  . sennosides  5 mL Per Tube BID  . sodium chloride flush  10-40 mL Intracatheter Q12H   Continuous Infusions: . sodium chloride Stopped (12/05/17 1901)  . dextrose 5 % and 0.45% NaCl 50 mL/hr at 12/09/17 0939  . famotidine (PEPCID) IV Stopped (12/08/17 2210)  . feeding supplement (JEVITY 1.2 CAL) 1,000 mL (12/09/17 1130)     LOS: 13 days     Cordelia Poche, MD [facility] 12/09/2017, 3:00 PM Pager: [phone]  If 7PM-7AM, please contact night-coverage [email or web address] Password [email or web address] 12/09/2017, 3:00 PM

## 2017-12-10 ENCOUNTER — Inpatient Hospital Stay (HOSPITAL_COMMUNITY): Payer: Medicare HMO

## 2017-12-10 ENCOUNTER — Encounter (HOSPITAL_COMMUNITY): Payer: Self-pay | Admitting: General Surgery

## 2017-12-10 LAB — CBC
HEMATOCRIT: 32.5 % — AB (ref 39.0–52.0)
HEMOGLOBIN: 10.4 g/dL — AB (ref 13.0–17.0)
MCH: 23.1 pg — ABNORMAL LOW (ref 26.0–34.0)
MCHC: 32 g/dL (ref 30.0–36.0)
MCV: 72.2 fL — ABNORMAL LOW (ref 78.0–100.0)
Platelets: 299 10*3/uL (ref 150–400)
RBC: 4.5 MIL/uL (ref 4.22–5.81)
RDW: 14.6 % (ref 11.5–15.5)
WBC: 9.6 10*3/uL (ref 4.0–10.5)

## 2017-12-10 LAB — GLUCOSE, CAPILLARY
GLUCOSE-CAPILLARY: 143 mg/dL — AB (ref 65–99)
GLUCOSE-CAPILLARY: 124 mg/dL — AB (ref 65–99)
GLUCOSE-CAPILLARY: 111 mg/dL — AB (ref 65–99)
Glucose-Capillary: 102 mg/dL — ABNORMAL HIGH (ref 65–99)
Glucose-Capillary: 119 mg/dL — ABNORMAL HIGH (ref 65–99)
Glucose-Capillary: 137 mg/dL — ABNORMAL HIGH (ref 65–99)
Glucose-Capillary: 141 mg/dL — ABNORMAL HIGH (ref 65–99)

## 2017-12-10 LAB — URINALYSIS, ROUTINE W REFLEX MICROSCOPIC
BILIRUBIN URINE: NEGATIVE
Glucose, UA: NEGATIVE mg/dL
Hgb urine dipstick: NEGATIVE
KETONES UR: NEGATIVE mg/dL
LEUKOCYTES UA: NEGATIVE
NITRITE: NEGATIVE
PROTEIN: NEGATIVE mg/dL
Specific Gravity, Urine: 1.016 (ref 1.005–1.030)
pH: 6 (ref 5.0–8.0)

## 2017-12-10 LAB — BASIC METABOLIC PANEL
ANION GAP: 11 (ref 5–15)
BUN: 26 mg/dL — AB (ref 6–20)
CHLORIDE: 110 mmol/L (ref 101–111)
CO2: 25 mmol/L (ref 22–32)
Calcium: 8.5 mg/dL — ABNORMAL LOW (ref 8.9–10.3)
Creatinine, Ser: 1.41 mg/dL — ABNORMAL HIGH (ref 0.61–1.24)
GFR calc Af Amer: >60 mL/min (ref >60)
GFR, EST NON AFRICAN AMERICAN: 55 mL/min — AB (ref >60)
Glucose, Bld: 164 mg/dL — ABNORMAL HIGH (ref 65–99)
POTASSIUM: 3.3 mmol/L — AB (ref 3.5–5.1)
SODIUM: 146 mmol/L — AB (ref 135–145)

## 2017-12-10 LAB — MRSA PCR SCREENING: MRSA BY PCR: NEGATIVE

## 2017-12-10 MED ORDER — ACETAMINOPHEN 160 MG/5ML PO SOLN
650.0000 mg | ORAL | Status: DC | PRN
  Administered 2017-12-10 – 2017-12-17 (×3): 650 mg
  Filled 2017-12-10 (×5): qty 20.3

## 2017-12-10 MED ORDER — ZOLPIDEM TARTRATE 5 MG PO TABS
5.0000 mg | ORAL_TABLET | Freq: Every day | ORAL | Status: DC
  Administered 2017-12-10 – 2017-12-21 (×12): 5 mg via ORAL
  Filled 2017-12-10 (×12): qty 1

## 2017-12-10 MED ORDER — POTASSIUM CHLORIDE 20 MEQ/15ML (10%) PO SOLN
20.0000 meq | Freq: Two times a day (BID) | ORAL | Status: AC
  Administered 2017-12-10: 20 meq
  Filled 2017-12-10: qty 15

## 2017-12-10 MED ORDER — ACETAMINOPHEN 650 MG RE SUPP: 650.0000 mg | RECTAL | Status: DC | PRN

## 2017-12-10 MED ORDER — ACETAMINOPHEN 325 MG PO TABS
650.0000 mg | ORAL_TABLET | ORAL | Status: DC | PRN
  Administered 2017-12-11 – 2017-12-12 (×3): 650 mg via ORAL
  Filled 2017-12-10 (×3): qty 2

## 2017-12-10 MED ORDER — POTASSIUM CHLORIDE CRYS ER 20 MEQ PO TBCR
20.0000 meq | EXTENDED_RELEASE_TABLET | Freq: Two times a day (BID) | ORAL | Status: DC
  Administered 2017-12-10: 20 meq via ORAL
  Filled 2017-12-10: qty 1

## 2017-12-10 NOTE — Progress Notes (Signed)
PROGRESS NOTE    [name]  VOJ:[id number] DOB: [birth year] DOA: 11/25/2017 PCP: Tawny Asal, MD   Brief Narrative: [name] is a 57 y.o. male with medical history significant for hypertension, hyperlipidemia, polysubstance abuse, and history of CVAs with residual weakness. Patient presented with speech difficulties and found to have multiple CVAs. Patient required intubation for airway protection. He was unable to successfully be extubated and trach was placed. During his hospitalization, he was treated for aspiration pneumonia. PEG tube placed on 1/25. Plan for discharge to SNF/LTAC. Currently off vent.   Assessment & Plan:   Principal Problem:   Stroke [facility]) Active Problems:   Essential hypertension, benign   History of completed stroke   Microcytic anemia   Aphasia   Dysarthria   Oropharyngeal dysphagia   Bulbar palsy (HCC)   Aspiration pneumonia (HCC)   Endotracheally intubated   Acute kidney injury ([place])   Atelectasis, bilateral   Encounter for nasogastric (NG) tube placement   Postextubation stridor   Acute respiratory failure with hypoxia ([place])   CVA History of stroke New this admission are left basal ganglia and small corona radiata infarcts. Evaluated by stroke team earlier in admission. LDL of 251, hemoglobin A1C of 5.7%, Transthoracic Echocardiogram significant for an EF of 60-65%.  -Continue Aspirin -Continue Lipitor  Tracheostomy Patient unable to tolerate extubation secondary to laryngeal edema and persistent stridor. Tracheostomy performed on 1/23. Patient on trach collar. On oxygen. -Respiratory recommendations  Aspiration pneumonia Treated with Zosyn and vancomycin. Course completed.  Acute respiratory failure with hypoxia Secondary to pseudobulbar etiology. Patient intubated from 1/13 to 1/18 then reintubated from 1/19 to 1/23. Trach performed on 1/23. Currently on trach collar. -PCCM recommendations  Chronic diastolic heart  failure Grade 1 diastolic dysfunction mentioned in 2014 echocardiogram. No mention in 2019 echocardiogram. Normal EF.  Essential hypertension -Continue amlodipine and lisinopril -hydrochlorothiazide held on admission  Acute kidney injury Improved. Not at baseline.  Fever Recurred. WBC wnl. Procalcitonin from 1/24 undetectable. -Blood cultures pending -Urinalysis/culture  Hypernatremia In setting of tube feeds. Improved with free water and IV fluids -Continue 200 ml free water flushes QID -Discontinue IV fluids -repeat BMP  Hypokalemia -Supplementation   DVT prophylaxis: Heparin subq Code Status: Full code Family Communication: Wife at bedside Disposition Plan: SNF vs LTAC when medically stable   Consultants:   Neurology  Critical care  General surgery  Procedures:   Echocardiogram (11/27/17) Study Conclusions  - Left ventricle: The cavity size was normal. Systolic function was   normal. The estimated ejection fraction was in the range of 60%   to 65%. Wall motion was normal; there were no regional wall   motion abnormalities. - Aortic valve: There was mild regurgitation. - Atrial septum: No defect or patent foramen ovale was identified. - Pulmonary arteries: PA peak pressure: 32 mm Hg (S).   Tracheostomy (1/23)  ETT (1/13>>1/18; 1/19>>1/23)  Antimicrobials:  Zosyn (1/14>>1/18)  Vancomycin (1/14>>1/18)    Subjective: Fever overnight. Tired this morning.  Objective: Vitals:   12/10/17 0451 12/10/17 0715 12/10/17 0800 12/10/17 1100  BP:   (!) 140/102 (!) 157/105  Pulse:  92 99   Resp:  (!) 22 (!) 24   Temp: 98.3 F (36.8 C) 99.5 F (37.5 C)    TempSrc: Oral Oral    SpO2:  100% 98%   Weight:      Height:        Intake/Output Summary (Last 24 hours) at 12/10/2017 1154 Last data filed at 12/10/2017  1100 Gross per 24 hour  Intake 3459.58 ml  Output 2050 ml  Net 1409.58 ml   [employer]   12/08/17 2330 12/09/17 0500 12/10/17 0318   Weight: 77.6 kg (171 lb 1.2 oz) 77.6 kg (171 lb 1.2 oz) 77.5 kg (170 lb 13.7 oz)    Examination:  General exam: Appears calm and comfortable Respiratory system: Rhonchi. Respiratory effort normal. Cardiovascular system: S1 & S2 heard, RRR. No murmurs, rubs, gallops or clicks. Gastrointestinal system: Abdomen is nondistended, soft and nontender.  Normal bowel sounds heard. Central nervous system: Alert. Extremities: No calf tenderness Skin: No cyanosis. No rashes    Data Reviewed: I have personally reviewed following labs and imaging studies  CBC: Recent Labs  Lab 12/05/17 0505 12/09/17 0450 12/10/17 0259  WBC 7.4 10.2 9.6  NEUTROABS 6.5 8.0*  --   HGB 11.6* 10.9* 10.4*  HCT 35.0* 34.5* 32.5*  MCV 72.2* 72.0* 72.2*  PLT 232 288 660   Basic Metabolic Panel: Recent Labs  Lab 12/04/17 0336 12/05/17 0505 12/06/17 0310 12/08/17 0448 12/09/17 0450 12/10/17 0259  NA 153* 150* 149* 149* 151* 146*  K 3.6 3.9 4.0 3.4* 3.6 3.3*  CL 117* 114* 112* 115* 114* 110  CO2 24 24 24 25 25 25   GLUCOSE 125* 149* 141* 273* 147* 164*  BUN 39* 37* 39* 36* 32* 26*  CREATININE 1.72* 1.59* 1.63* 1.67* 1.48* 1.41*  CALCIUM 8.8* 9.3 9.3 8.4* 8.9 8.5*  PHOS 2.7  --   --   --   --   --    GFR: Estimated Creatinine Clearance: 55.3 mL/min (A) (by C-G formula based on SCr of 1.41 mg/dL (H)). Liver Function Tests: Recent Labs  Lab 12/04/17 0336 12/05/17 0505  AST  --  36  ALT  --  46  ALKPHOS  --  82  BILITOT  --  0.6  PROT  --  6.8  ALBUMIN 2.6* 2.9*   No results for input(s): LIPASE, AMYLASE in the last 168 hours. No results for input(s): AMMONIA in the last 168 hours. Coagulation Profile: Recent Labs  Lab 12/06/17 0310  INR 1.05   Cardiac Enzymes: No results for input(s): CKTOTAL, CKMB, CKMBINDEX, TROPONINI in the last 168 hours. BNP (last 3 results) No results for input(s): PROBNP in the last 8760 hours. HbA1C: No results for input(s): HGBA1C in the last 72  hours. CBG: Recent Labs  Lab 12/09/17 1608 12/09/17 2050 12/10/17 0014 12/10/17 0302 12/10/17 0732  GLUCAP 130* 128* 141* 143* 124*   Lipid Profile: No results for input(s): CHOL, HDL, LDLCALC, TRIG, CHOLHDL, LDLDIRECT in the last 72 hours. Thyroid Function Tests: No results for input(s): TSH, T4TOTAL, FREET4, T3FREE, THYROIDAB in the last 72 hours. Anemia Panel: No results for input(s): VITAMINB12, FOLATE, FERRITIN, TIBC, IRON, RETICCTPCT in the last 72 hours. Sepsis Labs: Recent Labs  Lab 12/05/17 0505 12/06/17 0310 12/07/17 0635  PROCALCITON <0.10 <0.10 <0.10    No results found for this or any previous visit (from the past 240 hour(s)).       Radiology Studies: Dg Chest Port 1 View  Result Date: 12/10/2017 CLINICAL DATA:  Acute respiratory failure with hypoxia. EXAM: PORTABLE CHEST 1 VIEW COMPARISON:  12/06/2017 FINDINGS: Tracheostomy remains in place. Right arm PICC tip in the proximal right atrium. Mild cardiomegaly and aortic atherosclerosis as seen previously. Worsened patchy density at the right lung base consistent with atelectasis or mild pneumonia. Left lung remains clear. IMPRESSION: Development of right base patchy density which could be  atelectasis or mild pneumonia. Electronically Signed   By: Nelson Chimes M.D.   On: 12/10/2017 09:17        Scheduled Meds: . amLODipine  10 mg Per Tube q morning - 10a  . aspirin  325 mg Per Tube Daily  . atorvastatin  80 mg Per Tube q1800  . baclofen  5 mg Oral BID  . chlorhexidine  15 mL Mouth Rinse BID  . docusate  100 mg Per Tube BID  . feeding supplement (PRO-STAT SUGAR FREE 64)  30 mL Per Tube Daily  . free water  200 mL Per Tube QID  . heparin injection (subcutaneous)  5,000 Units Subcutaneous Q8H  . insulin aspart  0-9 Units Subcutaneous Q4H  . lisinopril  20 mg Per Tube BID  . mouth rinse  15 mL Mouth Rinse q12n4p  . potassium chloride  20 mEq Oral BID  . sennosides  5 mL Per Tube BID  . sodium chloride  flush  10-40 mL Intracatheter Q12H  . zolpidem  5 mg Oral QHS   Continuous Infusions: . sodium chloride Stopped (12/05/17 1901)  . dextrose 5 % and 0.45% NaCl 75 mL/hr at 12/10/17 0114  . famotidine (PEPCID) IV Stopped (12/09/17 2144)  . feeding supplement (JEVITY 1.2 CAL) 1,000 mL (12/10/17 0115)     LOS: 14 days     Cordelia Poche, MD [facility] 12/10/2017, 11:54 AM Pager: [phone]  If 7PM-7AM, please contact night-coverage [email or web address] Password [facility] 12/10/2017, 11:54 AM

## 2017-12-10 NOTE — Progress Notes (Signed)
Called report to 5W RN, Also called and spoke patients wife to update her on her husbands transfer to [id number].

## 2017-12-11 DIAGNOSIS — T17908A Unspecified foreign body in respiratory tract, part unspecified causing other injury, initial encounter: Secondary | ICD-10-CM

## 2017-12-11 DIAGNOSIS — Z930 Tracheostomy status: Secondary | ICD-10-CM

## 2017-12-11 DIAGNOSIS — R0902 Hypoxemia: Secondary | ICD-10-CM

## 2017-12-11 DIAGNOSIS — T17908S Unspecified foreign body in respiratory tract, part unspecified causing other injury, sequela: Secondary | ICD-10-CM

## 2017-12-11 LAB — RESPIRATORY PANEL BY PCR
Adenovirus: NOT DETECTED
BORDETELLA PERTUSSIS-RVPCR: NOT DETECTED
CORONAVIRUS 229E-RVPPCR: NOT DETECTED
Chlamydophila pneumoniae: NOT DETECTED
Coronavirus HKU1: NOT DETECTED
Coronavirus NL63: NOT DETECTED
Coronavirus OC43: NOT DETECTED
INFLUENZA B-RVPPCR: NOT DETECTED
Influenza A: NOT DETECTED
MYCOPLASMA PNEUMONIAE-RVPPCR: NOT DETECTED
Metapneumovirus: NOT DETECTED
Parainfluenza Virus 1: NOT DETECTED
Parainfluenza Virus 2: NOT DETECTED
Parainfluenza Virus 3: NOT DETECTED
Parainfluenza Virus 4: NOT DETECTED
RESPIRATORY SYNCYTIAL VIRUS-RVPPCR: NOT DETECTED
Rhinovirus / Enterovirus: NOT DETECTED

## 2017-12-11 LAB — BASIC METABOLIC PANEL
Anion gap: 9 (ref 5–15)
BUN: 22 mg/dL — ABNORMAL HIGH (ref 6–20)
CALCIUM: 8.7 mg/dL — AB (ref 8.9–10.3)
CHLORIDE: 109 mmol/L (ref 101–111)
CO2: 27 mmol/L (ref 22–32)
CREATININE: 1.31 mg/dL — AB (ref 0.61–1.24)
GFR calc non Af Amer: 60 mL/min — ABNORMAL LOW (ref >60)
Glucose, Bld: 139 mg/dL — ABNORMAL HIGH (ref 65–99)
Potassium: 3.4 mmol/L — ABNORMAL LOW (ref 3.5–5.1)
SODIUM: 145 mmol/L (ref 135–145)

## 2017-12-11 LAB — GLUCOSE, CAPILLARY
GLUCOSE-CAPILLARY: 118 mg/dL — AB (ref 65–99)
GLUCOSE-CAPILLARY: 102 mg/dL — AB (ref 65–99)
GLUCOSE-CAPILLARY: 108 mg/dL — AB (ref 65–99)
Glucose-Capillary: 137 mg/dL — ABNORMAL HIGH (ref 65–99)
Glucose-Capillary: 115 mg/dL — ABNORMAL HIGH (ref 65–99)
Glucose-Capillary: 121 mg/dL — ABNORMAL HIGH (ref 65–99)

## 2017-12-11 LAB — URINE CULTURE

## 2017-12-11 LAB — PROCALCITONIN: PROCALCITONIN: 0.17 ng/mL

## 2017-12-11 LAB — CBC
HCT: 31.9 % — ABNORMAL LOW (ref 39.0–52.0)
Hemoglobin: 10.2 g/dL — ABNORMAL LOW (ref 13.0–17.0)
MCH: 22.8 pg — ABNORMAL LOW (ref 26.0–34.0)
MCHC: 32 g/dL (ref 30.0–36.0)
MCV: 71.4 fL — ABNORMAL LOW (ref 78.0–100.0)
PLATELETS: 329 10*3/uL (ref 150–400)
RBC: 4.47 MIL/uL (ref 4.22–5.81)
RDW: 14.5 % (ref 11.5–15.5)
WBC: 9.6 10*3/uL (ref 4.0–10.5)

## 2017-12-11 LAB — STREP PNEUMONIAE URINARY ANTIGEN: Strep Pneumo Urinary Antigen: NEGATIVE

## 2017-12-11 MED ORDER — VANCOMYCIN HCL 10 G IV SOLR
1500.0000 mg | Freq: Once | INTRAVENOUS | Status: AC
  Administered 2017-12-11: 1500 mg via INTRAVENOUS
  Filled 2017-12-11: qty 1500

## 2017-12-11 MED ORDER — POTASSIUM CHLORIDE 20 MEQ/15ML (10%) PO SOLN
40.0000 meq | Freq: Once | ORAL | Status: AC
  Administered 2017-12-11: 40 meq
  Filled 2017-12-11: qty 30

## 2017-12-11 MED ORDER — VANCOMYCIN HCL IN DEXTROSE 750-5 MG/150ML-% IV SOLN
750.0000 mg | Freq: Two times a day (BID) | INTRAVENOUS | Status: DC
  Administered 2017-12-11 – 2017-12-13 (×4): 750 mg via INTRAVENOUS
  Filled 2017-12-11 (×6): qty 150

## 2017-12-11 MED ORDER — DEXTROSE 5 % IV SOLN
1.0000 g | Freq: Three times a day (TID) | INTRAVENOUS | Status: DC
  Administered 2017-12-12 – 2017-12-13 (×4): 1 g via INTRAVENOUS
  Filled 2017-12-11 (×6): qty 1

## 2017-12-11 MED ORDER — VANCOMYCIN HCL 10 G IV SOLR: 1500.0000 mg | Freq: Once | INTRAVENOUS | Status: DC

## 2017-12-11 MED ORDER — DEXTROSE 5 % IV SOLN
1.0000 g | Freq: Three times a day (TID) | INTRAVENOUS | Status: DC
  Administered 2017-12-11: 1 g via INTRAVENOUS
  Filled 2017-12-11 (×3): qty 1

## 2017-12-11 MED ORDER — BACLOFEN 10 MG PO TABS
5.0000 mg | ORAL_TABLET | Freq: Two times a day (BID) | ORAL | Status: DC
  Administered 2017-12-11 – 2017-12-22 (×23): 5 mg
  Filled 2017-12-11 (×22): qty 1

## 2017-12-11 NOTE — Progress Notes (Signed)
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  He subsequently had difficulty managing his secretions and required intubation on the day of admission.  CT scan showed stenosis of M1 and M2 segments on the left and there was infarction in the coronal radiata.  He was extubated on 1/19 but developed stridor and required reintubation.       Percutaneous tracheostomy was performed 1/23 and PEG tube was placed on 1/24.  He was placed on a trach collar yesterday afternoon and this has been well-tolerated overnight.  He has continuous eye opening but is not at all interactive.     SUBJECTIVE:  No events overnight, no new complaints, tolerating TC 24/7 at this point.  VITAL SIGNS: BP (!) 170/107   Pulse (!) 111   Temp 100.1 F (37.8 C) (Oral)   Resp 19   Ht 5\' 7"  (1.702 m)   Wt 77.5 kg (170 lb 13.7 oz)   SpO2 97%   BMI 26.76 kg/m   HEMODYNAMICS:    VENTILATOR SETTINGS: FiO2 (%):  [40 %] 40 %  INTAKE / OUTPUT: I/O last 3 completed shifts: In: 4500 [I.V.:2045; NG/GT:2355; IV Piggyback:100] Out: 3150 [Urine:3150]  PHYSICAL EXAMINATION: General: Chronically ill appearing, NAD  Neuro: Spontaneous eye movement, not tracking and not following any commands Cardiovascular: RRR, Nl S1/S2 and -M/R/G. Lungs: Trach midline, coarse BS diffusely Abdomen: Soft, NT, ND and +BS Ext: -edema and -tenderness  LABS:  BMET Recent Labs  Lab 12/09/17 0450 12/10/17 0259 12/11/17 0903  NA 151* 146* 145  K 3.6 3.3* 3.4*  CL 114* 110 109  CO2 25 25 27   BUN 32* 26* 22*  CREATININE 1.48* 1.41* 1.31*  GLUCOSE 147* 164* 139*    Electrolytes Recent Labs  Lab 12/09/17 0450  12/10/17 0259 12/11/17 0903  CALCIUM 8.9 8.5* 8.7*    CBC Recent Labs  Lab 12/09/17 0450 12/10/17 0259 12/11/17 0903  WBC 10.2 9.6 9.6  HGB 10.9* 10.4* 10.2*  HCT 34.5* 32.5* 31.9*  PLT 288 299 329    Coag's Recent Labs  Lab 12/06/17 0310  INR 1.05    Sepsis Markers Recent Labs  Lab 12/06/17 0310 12/07/17 0635 12/11/17 0405  PROCALCITON <0.10 <0.10 0.17    ABG Recent Labs  Lab 12/05/17 0358 12/06/17 0524  PHART 7.423 7.465*  PCO2ART 41.9 38.5  PO2ART 114.0* 127*    Liver Enzymes Recent Labs  Lab 12/05/17 0505  AST 36  ALT 46  ALKPHOS 82  BILITOT 0.6  ALBUMIN 2.9*    Cardiac Enzymes No results for input(s): TROPONINI, PROBNP in the last 168 hours.  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  Difficulty maintaining his airway and failed extubation, percutaneous tracheostomy was performed on 1/23.  PEG  tube was placed  on 1/24.  Discussed with PCCM-NP  ASSESSMENT / PLAN:  Chronic respiratory failure:  - SLP  - No decannulation given neuro status  Hypoxemia:  - Titrate O2 for sat of 88-92%  - Currently on 40%, hope is to get down to 28%  Trach status:  - Maintain current trach size and type  - On Friday, if still not requiring vent then will change to cuffless 6  Rush Farmer, M.D. [facility] Pulmonary/Critical Care Medicine. Pager: [phone]. After hours pager: [phone].  12/11/2017, 11:35 AM

## 2017-12-11 NOTE — Progress Notes (Signed)
RT note: Sputum sample obtained and sent down to main lab without complications.   

## 2017-12-11 NOTE — Progress Notes (Signed)
Physical Therapy Treatment Patient Details Name: [name] MRN: [id number] DOB: 19-Sep-1961 Today's Date: 12/11/2017    History of Present Illness 57 y.o.malewith hx of previous strokes, polysubstance abuse, HLD, HTN, right basal ganglia hemorrhage in 1999, and left basal ganglia hemorrhage 2005, presenting with episodicaphasia vs. dysarthria.Hedid not receive IV t-PA due to initial improvement in deficits and late presentation. 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 Patient developed severe dysphagia and was intubated on 1/13, trach 1/23, PEG 1/24    PT Comments    Patient received in bed with ongoing gaze to the left today, but able to move head and eyes to the right with cues, also able to slightly move legs in bed to assist with bed mobility. He continues to require MaxAx2 for functional bed mobility today, also continues to require assistance to sit midline at edge of bed and otherwise demonstrates posterior lean. Worked on actively moving head and UEs while sitting at EOB today, attempted to move LEs however unable to elicit LE motion while seated at edge of bed. He requires MaxAx2 to return to bed and for repositioning in the bed, left with bed alarm activated and RN present and attending.     Follow Up Recommendations  Supervision/Assistance - 24 hour;SNF     Equipment Recommendations  Wheelchair (measurements PT);Wheelchair cushion (measurements PT)    Recommendations for Other Services Rehab consult     Precautions / Restrictions Precautions Precautions: Fall Precaution Comments: trach, peg (placed after session), now off vent (12/11/17) Restrictions Weight Bearing Restrictions: No    Mobility  Bed Mobility Overal bed mobility: Needs Assistance Bed Mobility: Supine to Sit;Sit to Supine     Supine to sit: Max assist;+2 for safety/equipment Sit to supine: Max assist;+2 for  safety/equipment   General bed mobility comments: patient mostly with L gaze today but able to move head/eyes to R with cues, able to maintain upright sitting today with Mod assistance, participated in actively moving arms at edge of bed   Transfers                    Ambulation/Gait                 Stairs            Wheelchair Mobility    Modified Rankin (Stroke Patients Only)       Balance Overall balance assessment: Needs assistance Sitting-balance support: Feet supported;Single extremity supported Sitting balance-Leahy Scale: Poor Sitting balance - Comments: pt requires physical assist to maintain trunk in midline Postural control: Posterior lean   Standing balance-Leahy Scale: Zero                              Cognition Arousal/Alertness: Lethargic;Awake/alert Behavior During Therapy: Flat affect Overall Cognitive Status: Difficult to assess Area of Impairment: Following commands;Attention                   Current Attention Level: Focused   Following Commands: Follows one step commands inconsistently;Follows one step commands with increased time       General Comments: patient able to slightly move LEs/UEs in bed, able to move head slightly to R during sitting with cues       Exercises      General Comments        Pertinent Vitals/Pain Pain Assessment: Faces Pain Score: 0-No pain Faces Pain  Scale: No hurt Pain Intervention(s): Limited activity within patient's tolerance;Monitored during session;Repositioned    Home Living                      Prior Function            PT Goals (current goals can now be found in the care plan section) Acute Rehab PT Goals Patient Stated Goal: none stated PT Goal Formulation: With patient Time For Goal Achievement: 12/21/17 Potential to Achieve Goals: Fair Progress towards PT goals: Progressing toward goals    Frequency    Min 3X/week      PT Plan  Current plan remains appropriate    Co-evaluation              AM-PAC PT "6 Clicks" Daily Activity  Outcome Measure  Difficulty turning over in bed (including adjusting bedclothes, sheets and blankets)?: Unable Difficulty moving from lying on back to sitting on the side of the bed? : Unable Difficulty sitting down on and standing up from a chair with arms (e.g., wheelchair, bedside commode, etc,.)?: Unable Help needed moving to and from a bed to chair (including a wheelchair)?: Total Help needed walking in hospital room?: Total Help needed climbing 3-5 steps with a railing? : Total 6 Click Score: 6    End of Session Equipment Utilized During Treatment: Oxygen(trach tube ) Activity Tolerance: Patient tolerated treatment well Patient left: in bed;with nursing/sitter in room;with bed alarm set   PT Visit Diagnosis: Other symptoms and signs involving the nervous system (R29.898);Other abnormalities of gait and mobility (R26.89);Muscle weakness (generalized) (M62.81)     Time: 4540-9811 PT Time Calculation (min) (ACUTE ONLY): 17 min  Charges:  $Therapeutic Activity: 8-22 mins                    G Codes:       Deniece Ree PT, DPT, CBIS  Supplemental Physical Therapist [place]   Pager [phone]

## 2017-12-11 NOTE — Progress Notes (Signed)
Pharmacy Antibiotic Note  [name] is a 57 y.o. male admitted on 11/25/2017 with CVA, now w/ concern for pneumonia.  Pharmacy has been consulted for vancomycin dosing.  SCr has improved some since admission.  Plan: Vancomycin 1500mg  x1 then 750mg  IV every 12 hours.  Goal trough 15-20 mcg/mL.  Height: 5\' 7"  (170.2 cm) Weight: 170 lb 13.7 oz (77.5 kg) IBW/kg (Calculated) : 66.1  Temp (24hrs), Avg:100.3 F (37.9 C), Min:99 F (37.2 C), Max:102.1 F (38.9 C)  Recent Labs  Lab 12/05/17 0505 12/06/17 0310 12/08/17 0448 12/09/17 0450 12/10/17 0259  WBC 7.4  --   --  10.2 9.6  CREATININE 1.59* 1.63* 1.67* 1.48* 1.41*    Estimated Creatinine Clearance: 55.3 mL/min (A) (by C-G formula based on SCr of 1.41 mg/dL (H)).    No Known Allergies   Thank you for allowing pharmacy to be a part of this patient's care.  Wynona Neat, PharmD, BCPS  12/11/2017 7:47 AM

## 2017-12-11 NOTE — Progress Notes (Addendum)
PROGRESS NOTE    [name]  ZOX:[id number] DOB: [birth year] DOA: 11/25/2017 PCP: Tawny Asal, MD   Brief Narrative: [name] is a 57 y.o. male with medical history significant for hypertension, hyperlipidemia, polysubstance abuse, and history of CVAs with residual weakness. Patient presented with speech difficulties and found to have multiple CVAs. Patient required intubation for airway protection. He was unable to successfully be extubated and trach was placed. During his hospitalization, he was treated for aspiration pneumonia. PEG tube placed on 1/25. Plan for discharge to SNF/LTAC. Currently off vent.   Assessment & Plan:   Principal Problem:   Stroke [facility]) Active Problems:   Essential hypertension, benign   History of completed stroke   Microcytic anemia   Aphasia   Dysarthria   Oropharyngeal dysphagia   Bulbar palsy (HCC)   Aspiration pneumonia (HCC)   Endotracheally intubated   Acute kidney injury ([place])   Atelectasis, bilateral   Encounter for nasogastric (NG) tube placement   Postextubation stridor   Acute respiratory failure with hypoxia ([place])   CVA History of stroke New this admission are left basal ganglia and small corona radiata infarcts. Evaluated by stroke team earlier in admission. LDL of 251, hemoglobin A1C of 5.7%, Transthoracic Echocardiogram significant for an EF of 60-65%.  -Continue Aspirin -Continue Lipitor  Tracheostomy Patient unable to tolerate extubation secondary to laryngeal edema and persistent stridor. Tracheostomy performed on 1/23. Patient on trach collar. On oxygen. -Respiratory recommendations  Aspiration pneumonia Treated with Zosyn and vancomycin. Course completed.  Acute respiratory failure with hypoxia Secondary to pseudobulbar etiology. Patient intubated from 1/13 to 1/18 then reintubated from 1/19 to 1/23. Trach performed on 1/23. Currently on trach collar. -PCCM recommendations  Chronic diastolic heart  failure Grade 1 diastolic dysfunction mentioned in 2014 echocardiogram. No mention in 2019 echocardiogram. Normal EF.  Essential hypertension -Continue amlodipine and lisinopril -hydrochlorothiazide held on admission  Acute kidney injury Improved. Not at baseline. -repeat BMP pending  Fever WBC wnl. Procalcitonin from 1/24 undetectable. Procalcitonin of 0.17 on 1/28, however, fevers continue to recur. Chest x-ray consistent with RLL vs atelectasis. -Blood cultures pending -Urinalysis/culture -Vancomycin/cefepime -sputum culture/gram stain, urine strep, legionella ag,   Hypernatremia In setting of tube feeds. Improved with free water and IV fluids -Continue 200 ml free water flushes QID -repeat BMP pending  Hypokalemia -Supplementation -repeat BMP pending  Tachycardia Secondary to fever. Blood pressure normal. -EKG   DVT prophylaxis: Heparin subq Code Status: Full code Family Communication: Wife at bedside Disposition Plan: SNF vs LTAC when medically stable   Consultants:   Neurology  Critical care  General surgery  Procedures:   Echocardiogram (11/27/17) Study Conclusions  - Left ventricle: The cavity size was normal. Systolic function was   normal. The estimated ejection fraction was in the range of 60%   to 65%. Wall motion was normal; there were no regional wall   motion abnormalities. - Aortic valve: There was mild regurgitation. - Atrial septum: No defect or patent foramen ovale was identified. - Pulmonary arteries: PA peak pressure: 32 mm Hg (S).   Tracheostomy (1/23)  ETT (1/13>>1/18; 1/19>>1/23)  Antimicrobials:  Zosyn (1/14>>1/18)  Vancomycin (1/14>>1/18; 1/28>>  Cefepime (1/28>>   Subjective: High fever overnight.  Objective: Vitals:   12/10/17 2308 12/10/17 2348 12/11/17 0343 12/11/17 0542  BP: (!) 162/105   (!) 148/100  Pulse: (!) 126 (!) 130 (!) 130 (!) 109  Resp: 20 20 (!) 22 20  Temp: (!) 102.1 F (38.9 C)   100.1  F  (37.8 C)  TempSrc: Axillary   Oral  SpO2: 100% 100% 100% 100%  Weight:      Height:        Intake/Output Summary (Last 24 hours) at 12/11/2017 0824 Last data filed at 12/11/2017 0500 Gross per 24 hour  Intake 2230 ml  Output 2025 ml  Net 205 ml   Filed Weights   12/08/17 2330 12/09/17 0500 12/10/17 0318  Weight: 77.6 kg (171 lb 1.2 oz) 77.6 kg (171 lb 1.2 oz) 77.5 kg (170 lb 13.7 oz)    Examination:  General exam: Appears calm and comfortable Respiratory system: Rhonchi. Respiratory effort normal. Cardiovascular system: S1 & S2 heard, tachycardia, regular rhythm. No murmurs, rubs, gallops or clicks. Gastrointestinal system: Abdomen is nondistended, soft and nontender.  Normal bowel sounds heard. Central nervous system: Alert. Unable to assess orientation. Not verbal. Extremities: No calf tenderness Skin: No cyanosis. No rashes    Data Reviewed: I have personally reviewed following labs and imaging studies  CBC: Recent Labs  Lab 12/05/17 0505 12/09/17 0450 12/10/17 0259  WBC 7.4 10.2 9.6  NEUTROABS 6.5 8.0*  --   HGB 11.6* 10.9* 10.4*  HCT 35.0* 34.5* 32.5*  MCV 72.2* 72.0* 72.2*  PLT 232 288 976   Basic Metabolic Panel: Recent Labs  Lab 12/05/17 0505 12/06/17 0310 12/08/17 0448 12/09/17 0450 12/10/17 0259  NA 150* 149* 149* 151* 146*  K 3.9 4.0 3.4* 3.6 3.3*  CL 114* 112* 115* 114* 110  CO2 24 24 25 25 25   GLUCOSE 149* 141* 273* 147* 164*  BUN 37* 39* 36* 32* 26*  CREATININE 1.59* 1.63* 1.67* 1.48* 1.41*  CALCIUM 9.3 9.3 8.4* 8.9 8.5*   GFR: Estimated Creatinine Clearance: 55.3 mL/min (A) (by C-G formula based on SCr of 1.41 mg/dL (H)). Liver Function Tests: Recent Labs  Lab 12/05/17 0505  AST 36  ALT 46  ALKPHOS 82  BILITOT 0.6  PROT 6.8  ALBUMIN 2.9*   No results for input(s): LIPASE, AMYLASE in the last 168 hours. No results for input(s): AMMONIA in the last 168 hours. Coagulation Profile: Recent Labs  Lab 12/06/17 0310  INR 1.05    Cardiac Enzymes: No results for input(s): CKTOTAL, CKMB, CKMBINDEX, TROPONINI in the last 168 hours. BNP (last 3 results) No results for input(s): PROBNP in the last 8760 hours. HbA1C: No results for input(s): HGBA1C in the last 72 hours. CBG: Recent Labs  Lab 12/10/17 1606 12/10/17 1920 12/11/17 0203 12/11/17 0604 12/11/17 0753  GLUCAP 102* 111* 118* 137* 115*   Lipid Profile: No results for input(s): CHOL, HDL, LDLCALC, TRIG, CHOLHDL, LDLDIRECT in the last 72 hours. Thyroid Function Tests: No results for input(s): TSH, T4TOTAL, FREET4, T3FREE, THYROIDAB in the last 72 hours. Anemia Panel: No results for input(s): VITAMINB12, FOLATE, FERRITIN, TIBC, IRON, RETICCTPCT in the last 72 hours. Sepsis Labs: Recent Labs  Lab 12/05/17 0505 12/06/17 0310 12/07/17 0635 12/11/17 0405  PROCALCITON <0.10 <0.10 <0.10 0.17    Recent Results (from the past 240 hour(s))  Culture, blood (routine x 2)     Status: None (Preliminary result)   Collection Time: 12/09/17  3:58 PM  Result Value Ref Range Status   Specimen Description BLOOD LEFT ANTECUBITAL  Final   Special Requests   Final    BOTTLES DRAWN AEROBIC AND ANAEROBIC Blood Culture adequate volume   Culture NO GROWTH < 24 HOURS  Final   Report Status PENDING  Incomplete  Culture, blood (routine x 2)  Status: None (Preliminary result)   Collection Time: 12/09/17  4:05 PM  Result Value Ref Range Status   Specimen Description BLOOD BLOOD RIGHT HAND  Final   Special Requests   Final    BOTTLES DRAWN AEROBIC AND ANAEROBIC Blood Culture adequate volume   Culture NO GROWTH < 24 HOURS  Final   Report Status PENDING  Incomplete  MRSA PCR Screening     Status: None   Collection Time: 12/10/17  6:27 PM  Result Value Ref Range Status   MRSA by PCR NEGATIVE NEGATIVE Final    Comment:        The GeneXpert MRSA Assay (FDA approved for NASAL specimens only), is one component of a comprehensive MRSA colonization surveillance program.  It is not intended to diagnose MRSA infection nor to guide or monitor treatment for MRSA infections.          Radiology Studies: Dg Chest Port 1 View  Result Date: 12/10/2017 CLINICAL DATA:  Acute respiratory failure with hypoxia. EXAM: PORTABLE CHEST 1 VIEW COMPARISON:  12/06/2017 FINDINGS: Tracheostomy remains in place. Right arm PICC tip in the proximal right atrium. Mild cardiomegaly and aortic atherosclerosis as seen previously. Worsened patchy density at the right lung base consistent with atelectasis or mild pneumonia. Left lung remains clear. IMPRESSION: Development of right base patchy density which could be atelectasis or mild pneumonia. Electronically Signed   By: Nelson Chimes M.D.   On: 12/10/2017 09:17        Scheduled Meds: . amLODipine  10 mg Per Tube q morning - 10a  . aspirin  325 mg Per Tube Daily  . atorvastatin  80 mg Per Tube q1800  . baclofen  5 mg Oral BID  . chlorhexidine  15 mL Mouth Rinse BID  . docusate  100 mg Per Tube BID  . feeding supplement (PRO-STAT SUGAR FREE 64)  30 mL Per Tube Daily  . free water  200 mL Per Tube QID  . heparin injection (subcutaneous)  5,000 Units Subcutaneous Q8H  . insulin aspart  0-9 Units Subcutaneous Q4H  . lisinopril  20 mg Per Tube BID  . mouth rinse  15 mL Mouth Rinse q12n4p  . sennosides  5 mL Per Tube BID  . sodium chloride flush  10-40 mL Intracatheter Q12H  . zolpidem  5 mg Oral QHS   Continuous Infusions: . sodium chloride Stopped (12/05/17 1901)  . ceFEPime (MAXIPIME) IV    . dextrose 5 % and 0.45% NaCl 75 mL/hr at 12/11/17 0734  . feeding supplement (JEVITY 1.2 CAL) 1,000 mL (12/10/17 1800)  . vancomycin    . vancomycin       LOS: 15 days     Cordelia Poche, MD [facility] 12/11/2017, 8:24 AM Pager: [phone]  If 7PM-7AM, please contact night-coverage [email or web address] Password [facility] 12/11/2017, 8:24 AM

## 2017-12-12 ENCOUNTER — Inpatient Hospital Stay (HOSPITAL_COMMUNITY): Payer: Medicare HMO

## 2017-12-12 LAB — GLUCOSE, CAPILLARY
GLUCOSE-CAPILLARY: 112 mg/dL — AB (ref 65–99)
GLUCOSE-CAPILLARY: 118 mg/dL — AB (ref 65–99)
GLUCOSE-CAPILLARY: 139 mg/dL — AB (ref 65–99)
Glucose-Capillary: 109 mg/dL — ABNORMAL HIGH (ref 65–99)
Glucose-Capillary: 110 mg/dL — ABNORMAL HIGH (ref 65–99)
Glucose-Capillary: 132 mg/dL — ABNORMAL HIGH (ref 65–99)
Glucose-Capillary: 140 mg/dL — ABNORMAL HIGH (ref 65–99)

## 2017-12-12 LAB — BASIC METABOLIC PANEL
ANION GAP: 11 (ref 5–15)
BUN: 22 mg/dL — ABNORMAL HIGH (ref 6–20)
CHLORIDE: 107 mmol/L (ref 101–111)
CO2: 26 mmol/L (ref 22–32)
Calcium: 8.6 mg/dL — ABNORMAL LOW (ref 8.9–10.3)
Creatinine, Ser: 1.28 mg/dL — ABNORMAL HIGH (ref 0.61–1.24)
GFR calc Af Amer: >60 mL/min (ref >60)
GFR calc non Af Amer: >60 mL/min (ref >60)
GLUCOSE: 116 mg/dL — AB (ref 65–99)
POTASSIUM: 3.6 mmol/L (ref 3.5–5.1)
SODIUM: 144 mmol/L (ref 135–145)

## 2017-12-12 LAB — HIV ANTIBODY (ROUTINE TESTING W REFLEX): HIV SCREEN 4TH GENERATION: NONREACTIVE

## 2017-12-12 MED ORDER — CARVEDILOL 3.125 MG PO TABS
3.1250 mg | ORAL_TABLET | Freq: Two times a day (BID) | ORAL | Status: DC
  Administered 2017-12-12 – 2017-12-16 (×8): 3.125 mg via ORAL
  Filled 2017-12-12 (×8): qty 1

## 2017-12-12 MED ORDER — FREE WATER: 200.0000 mL | Freq: Three times a day (TID) | Status: DC

## 2017-12-12 MED ORDER — ISOSORBIDE MONONITRATE ER 30 MG PO TB24: 15.0000 mg | ORAL_TABLET | Freq: Every day | ORAL | Status: DC

## 2017-12-12 NOTE — Progress Notes (Signed)
Physical Therapy Treatment Patient Details Name: [name] MRN: [id number] DOB: 29-Dec-1960 Today's Date: 12/12/2017    History of Present Illness 57 y.o.malewith hx of previous strokes, polysubstance abuse, HLD, HTN, right basal ganglia hemorrhage in 1999, and left basal ganglia hemorrhage 2005, presenting with episodicaphasia vs. dysarthria.Hedid not receive IV t-PA due to initial improvement in deficits and late presentation. 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 Patient developed severe dysphagia and was intubated on 1/13, trach 1/23, PEG 1/24    PT Comments    Pt with very flat affect but seemed agreeable to treatment and seemed to enjoy sitting up at EOB. Worked on trunk activation and maintaining sitting balance as well as crossing midline to L with gaze. Pt maintained sitting for >15 mins with mod A. PT will continue to follow.   Follow Up Recommendations  Supervision/Assistance - 24 hour;SNF     Equipment Recommendations  Wheelchair (measurements PT);Wheelchair cushion (measurements PT)    Recommendations for Other Services Rehab consult     Precautions / Restrictions Precautions Precautions: Fall Precaution Comments: trach, peg, off vent (12/11/17) Restrictions Weight Bearing Restrictions: No    Mobility  Bed Mobility Overal bed mobility: Needs Assistance Bed Mobility: Supine to Sit;Sit to Supine Rolling: Max assist   Supine to sit: Max assist;+2 for safety/equipment Sit to supine: Max assist;+2 for safety/equipment   General bed mobility comments: pt turns head to left on cue, takes longer to move eyes to L to actually transfer gaze. Some oblique activiation with sitting up and attempted right reactions noted  Transfers Overall transfer level: Needs assistance               General transfer comment: did not perform   Ambulation/Gait             General Gait  Details: unable presently   Stairs            Wheelchair Mobility    Modified Rankin (Stroke Patients Only) Modified Rankin (Stroke Patients Only) Pre-Morbid Rankin Score: Moderate disability Modified Rankin: Severe disability     Balance Overall balance assessment: Needs assistance Sitting-balance support: Feet supported;Single extremity supported Sitting balance-Leahy Scale: Poor Sitting balance - Comments: mod A to maintain sitting, posterior lean Postural control: Posterior lean                     High Level Balance Comments: worked on [employer] on L elbow and then on R and participating with returning to midline. Pt more comfortable to R elbow [place]            Cognition Arousal/Alertness: Awake/alert Behavior During Therapy: Flat affect Overall Cognitive Status: Difficult to assess Area of Impairment: Following commands;Attention                   Current Attention Level: Focused   Following Commands: Follows one step commands inconsistently;Follows one step commands with increased time       General Comments: patient able to slightly move LEs/UEs in bed, able to move head slightly to R during sitting with cues       Exercises Other Exercises Other Exercises: PROM to BUE in supine Other Exercises: PROM BLE's in supine Other Exercises: AAROM B knee extension in sitting    General Comments General comments (skin integrity, edema, etc.): worked on postural perturbation and correction, pt needing mod A to return to midline.       Pertinent Vitals/Pain  Pain Assessment: Faces Pain Score: 0-No pain Faces Pain Scale: No hurt Pain Location: allowed passive movement of all 4 extremities without sign of pain    Home Living                      Prior Function            PT Goals (current goals can now be found in the care plan section) Acute Rehab PT Goals Patient Stated Goal: none stated PT Goal Formulation: With patient Time  For Goal Achievement: 12/21/17 Potential to Achieve Goals: Fair Progress towards PT goals: Progressing toward goals    Frequency    Min 3X/week      PT Plan Current plan remains appropriate    Co-evaluation              AM-PAC PT "6 Clicks" Daily Activity  Outcome Measure  Difficulty turning over in bed (including adjusting bedclothes, sheets and blankets)?: Unable Difficulty moving from lying on back to sitting on the side of the bed? : Unable Difficulty sitting down on and standing up from a chair with arms (e.g., wheelchair, bedside commode, etc,.)?: Unable Help needed moving to and from a bed to chair (including a wheelchair)?: Total Help needed walking in hospital room?: Total Help needed climbing 3-5 steps with a railing? : Total 6 Click Score: 6    End of Session Equipment Utilized During Treatment: Oxygen(trach tube ) Activity Tolerance: Patient tolerated treatment well Patient left: in bed;with bed alarm set Nurse Communication: Mobility status;Need for lift equipment PT Visit Diagnosis: Other symptoms and signs involving the nervous system (R29.898);Other abnormalities of gait and mobility (R26.89);Muscle weakness (generalized) (M62.81)     Time: 9373-4287 PT Time Calculation (min) (ACUTE ONLY): 23 min  Charges:  $Therapeutic Activity: 23-37 mins                    G Codes:       [place]  [place] 12/12/2017, 10:41 AM

## 2017-12-12 NOTE — Progress Notes (Signed)
Occupational Therapy Treatment Patient Details Name: [name] MRN: [id number] DOB: [birth year] Today's Date: 12/12/2017    History of present illness 57 y.o.malewith hx of previous strokes, polysubstance abuse, HLD, HTN, right basal ganglia hemorrhage in 1999, and left basal ganglia hemorrhage 2005, presenting with episodicaphasia vs. dysarthria.Hedid not receive IV t-PA due to initial improvement in deficits and late presentation. 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 Patient developed severe dysphagia and was intubated on 1/13, trach 1/23, PEG 1/24   OT comments  Pt with  flat affect this session, however agreeable to OT. Worked with pt at bed level on cognition, 25% following commands and required hand over hand assist with washing face, able to perform L gaze and to midline on command x 2/4 times and tolerated PROM of B UEs. OT will continue to follow acutely  Follow Up Recommendations  [facility]    Equipment Recommendations  None recommended by OT    Recommendations for Other Services      Precautions / Restrictions Precautions Precautions: Fall Precaution Comments: trach, peg, off vent (12/11/17) Restrictions Weight Bearing Restrictions: No       Mobility Bed Mobility Overal bed mobility: Needs Assistance Bed Mobility: Supine to Sit;Sit to Supine Rolling: Max assist   Supine to sit: Max assist;+2 for safety/equipment Sit to supine: Max assist;+2 for safety/equipment   General bed mobility comments: N/A  Transfers Overall transfer level: Needs assistance               General transfer comment: N/A    Balance Overall balance assessment: Needs assistance Sitting-balance support: Feet supported;Single extremity supported Sitting balance-Leahy Scale: Poor Sitting balance - Comments: mod A to maintain sitting, posterior lean Postural control: Posterior lean                      High Level Balance Comments: worked on [employer] on L elbow and then on R and participating with returning to midline. Pt more comfortable to R elbow [place]           ADL either performed or assessed with clinical judgement   ADL Overall ADL's : Needs assistance/impaired     Grooming: Wash/dry hands;Wash/dry face;Bed level;Maximal assistance(hand over hand assist)                                 General ADL Comments: pt following one step commands 25% of time, required hand over hand and physical cues/assist for grooming and UE ROM     Vision   Additional Comments: able to cross midline with gaze x 2 on command   Perception     Praxis      Cognition Arousal/Alertness: Awake/alert Behavior During Therapy: Flat affect Overall Cognitive Status: Difficult to assess Area of Impairment: Following commands;Attention                   Current Attention Level: Focused   Following Commands: Follows one step commands inconsistently;Follows one step commands with increased time       General Comments: pt following one step commands 25% of time, required hand over hand and physical cues/assist for grooming and UE ROM .patient able to slightly move UEs in bed, able to move head slightly to R and to midline        Exercises Exercises: Other exercises Other Exercises Other Exercises: PROM to BUE in multiple  planes Other Exercises: PROM BLE's in supine Other Exercises: AAROM B knee extension in sitting   Shoulder Instructions       General Comments worked on postural perturbation and correction, pt needing mod A to return to midline.     Pertinent Vitals/ Pain       Pain Assessment: Faces Pain Score: 0-No pain Faces Pain Scale: No hurt Pain Location: allowed passive movement of all 4 extremities without sign of pain Pain Intervention(s): Monitored during session  Home Living                                          Prior  Functioning/Environment              Frequency  Min 2X/week        Progress Toward Goals  OT Goals(current goals can now be found in the care plan section)  Progress towards OT goals: Progressing toward goals  Acute Rehab OT Goals Patient Stated Goal: none stated  Plan Discharge plan remains appropriate    Co-evaluation                 AM-PAC PT "6 Clicks" Daily Activity     Outcome Measure   Help from another person eating meals?: Total Help from another person taking care of personal grooming?: Total Help from another person toileting, which includes using toliet, bedpan, or urinal?: Total Help from another person bathing (including washing, rinsing, drying)?: Total Help from another person to put on and taking off regular upper body clothing?: Total Help from another person to put on and taking off regular lower body clothing?: Total 6 Click Score: 6    End of Session    OT Visit Diagnosis: Muscle weakness (generalized) (M62.81);Other symptoms and signs involving cognitive function;Hemiplegia and hemiparesis Hemiplegia - Right/Left: Right Hemiplegia - dominant/non-dominant: Dominant Hemiplegia - caused by: Cerebral infarction   Activity Tolerance Patient limited by fatigue   Patient Left in bed;with call [place]/phone within reach   Nurse Communication      Functional Assessment Tool Used: AM-PAC 6 Clicks Daily Activity   Time: 1341-1401 OT Time Calculation (min): 20 min  Charges: OT G-codes **NOT FOR INPATIENT CLASS** Functional Assessment Tool Used: AM-PAC 6 Clicks Daily Activity OT General Charges $OT Visit: 1 Visit OT Treatments $Therapeutic Activity: 8-22 mins     Britt Bottom 12/12/2017, 2:27 PM

## 2017-12-12 NOTE — Progress Notes (Signed)
[facility] PROGRESS NOTE    Progress Note  [name]  EXB:[id number] DOB: 1961/08/17 DOA: 11/25/2017 PCP: Tawny Asal, MD     Brief Narrative:   [name] is an 57 y.o. male past medical history significant for hypertension polysubstance abuse came into the hospital for multiple CVAs speech difficulties which required intubation for airway protection patient was extubated successfully during hospitalization he developed aspiration pneumonia PEG tube was placed on 12/08/2017 and plan for LTAC/dyspnea currently off the vent.  Assessment/Plan:   Principal Problem:   Stroke [facility]) Active Problems:   Essential hypertension, benign   History of completed stroke   Microcytic anemia   Aphasia   Dysarthria   Oropharyngeal dysphagia   Bulbar palsy (HCC)   Aspiration pneumonia (HCC)   Endotracheally intubated   Acute kidney injury (HCC)   Atelectasis, bilateral   Encounter for nasogastric (NG) tube placement   Postextubation stridor   Acute respiratory failure with hypoxia (HCC)   Aspiration into airway   Hypoxemia   Tracheostomy status (HCC)  CVA: With new left basal ganglia and small corona radiata infarct. Valuated by stroke team on LDL on aspirin.  Aspiration pneumonia: He is completed his treatment in house.  Acute respiratory failure with hypoxia probably due to pseudobulbar etiology: Status post extubation now with trach collar placed on 12/06/2017 saturations have been stable.  Chronic diastolic heart failure seems to be euvolemic.  Essential hypertension: Blood pressure continues to be high, will add Imdur  Acute kidney injury: Resolved with now at baseline.  Fever of unknown source: Blood Culture data is pending.  Urine culture grew multiple species. He was started on IV vancomycin and cefepime Pro calcitonin was 0.17. Respiratory panel is negative Concern for as he is high risk of aspiration.  Continue to spike fever we will check a CBC  tomorrow morning.  Continue empiric antibiotics. Check a chest x-ray.  Hyponatremia: Setting of tube feeds DC IV fluids start on free water flushes.  Check a basic metabolic panel in the morning.  Hypokalemia: Replete orally now resolved.  Tachycardia: Likely due to fevers  DVT prophylaxis: loveonex Family Communication:none Disposition Plan/Barrier to D/C: SNF in medically stable Code Status:     Code Status Orders  (From admission, onward)        Start     Ordered   11/26/17 2008  Full code  Continuous     11/26/17 2011    Code Status History    Date Active Date Inactive Code Status Order ID Comments User Context   11/26/2017 00:36 11/26/2017 20:11 Full Code [id number]  OpydIlene Qua, MD ED        IV Access:    Peripheral IV   Procedures and diagnostic studies:   No results found.   Medical Consultants:    None.  Anti-Infectives:   IV vancomycin and cefepime.  Subjective:    [name] nonverbal  Objective:    Vitals:   12/12/17 0605 12/12/17 0850 12/12/17 1100 12/12/17 1145  BP: (!) 148/96  (!) 161/108   Pulse: (!) 103 (!) 118 (!) 120 (!) 119  Resp: (!) 21 (!) 22  20  Temp: 98.5 F (36.9 C)     TempSrc: Oral     SpO2: 99% 98% 99% 99%  Weight:      Height:        Intake/Output Summary (Last 24 hours) at 12/12/2017 1238 Last data filed at 12/12/2017 0723 Gross per 24 hour  Intake  1686.75 ml  Output 1525 ml  Net 161.75 ml   Filed Weights   12/09/17 0500 12/10/17 0318 12/12/17 0600  Weight: 77.6 kg (171 lb 1.2 oz) 77.5 kg (170 lb 13.7 oz) 79.7 kg (175 lb 11.3 oz)    Exam: General exam: In no acute distress. Respiratory system: Good air movement and clear to auscultation. Cardiovascular system: S1 & S2 heard, RRR.  Gastrointestinal system: Abdomen is nondistended, soft and nontender.  Extremities: No pedal edema. Skin: No rashes, lesions or ulcers    Data Reviewed:    Labs: Basic Metabolic Panel: Recent Labs  Lab  12/08/17 0448 12/09/17 0450 12/10/17 0259 12/11/17 0903 12/12/17 0648  NA 149* 151* 146* 145 144  K 3.4* 3.6 3.3* 3.4* 3.6  CL 115* 114* 110 109 107  CO2 25 25 25 27 26   GLUCOSE 273* 147* 164* 139* 116*  BUN 36* 32* 26* 22* 22*  CREATININE 1.67* 1.48* 1.41* 1.31* 1.28*  CALCIUM 8.4* 8.9 8.5* 8.7* 8.6*   GFR Estimated Creatinine Clearance: 65.9 mL/min (A) (by C-G formula based on SCr of 1.28 mg/dL (H)). Liver Function Tests: No results for input(s): AST, ALT, ALKPHOS, BILITOT, PROT, ALBUMIN in the last 168 hours. No results for input(s): LIPASE, AMYLASE in the last 168 hours. No results for input(s): AMMONIA in the last 168 hours. Coagulation profile Recent Labs  Lab 12/06/17 0310  INR 1.05    CBC: Recent Labs  Lab 12/09/17 0450 12/10/17 0259 12/11/17 0903  WBC 10.2 9.6 9.6  NEUTROABS 8.0*  --   --   HGB 10.9* 10.4* 10.2*  HCT 34.5* 32.5* 31.9*  MCV 72.0* 72.2* 71.4*  PLT 288 299 329   Cardiac Enzymes: No results for input(s): CKTOTAL, CKMB, CKMBINDEX, TROPONINI in the last 168 hours. BNP (last 3 results) No results for input(s): PROBNP in the last 8760 hours. CBG: Recent Labs  Lab 12/11/17 1614 12/11/17 2026 12/12/17 0043 12/12/17 0414 12/12/17 0747  GLUCAP 121* 108* 140* 132* 110*   D-Dimer: No results for input(s): DDIMER in the last 72 hours. Hgb A1c: No results for input(s): HGBA1C in the last 72 hours. Lipid Profile: No results for input(s): CHOL, HDL, LDLCALC, TRIG, CHOLHDL, LDLDIRECT in the last 72 hours. Thyroid function studies: No results for input(s): TSH, T4TOTAL, T3FREE, THYROIDAB in the last 72 hours.  Invalid input(s): FREET3 Anemia work up: No results for input(s): VITAMINB12, FOLATE, FERRITIN, TIBC, IRON, RETICCTPCT in the last 72 hours. Sepsis Labs: Recent Labs  Lab 12/06/17 0310 12/07/17 3810 12/09/17 0450 12/10/17 0259 12/11/17 0405 12/11/17 0903  PROCALCITON <0.10 <0.10  --   --  0.17  --   WBC  --   --  10.2 9.6  --   9.6   Microbiology Recent Results (from the past 240 hour(s))  Culture, blood (routine x 2)     Status: None (Preliminary result)   Collection Time: 12/09/17  3:58 PM  Result Value Ref Range Status   Specimen Description BLOOD LEFT ANTECUBITAL  Final   Special Requests   Final    BOTTLES DRAWN AEROBIC AND ANAEROBIC Blood Culture adequate volume   Culture NO GROWTH 3 DAYS  Final   Report Status PENDING  Incomplete  Culture, blood (routine x 2)     Status: None (Preliminary result)   Collection Time: 12/09/17  4:05 PM  Result Value Ref Range Status   Specimen Description BLOOD BLOOD RIGHT HAND  Final   Special Requests   Final    BOTTLES DRAWN  AEROBIC AND ANAEROBIC Blood Culture adequate volume   Culture NO GROWTH 3 DAYS  Final   Report Status PENDING  Incomplete  Culture, Urine     Status: Abnormal   Collection Time: 12/10/17 12:07 PM  Result Value Ref Range Status   Specimen Description URINE, CLEAN CATCH  Final   Special Requests NONE  Final   Culture MULTIPLE SPECIES PRESENT, SUGGEST RECOLLECTION (A)  Final   Report Status 12/11/2017 FINAL  Final  MRSA PCR Screening     Status: None   Collection Time: 12/10/17  6:27 PM  Result Value Ref Range Status   MRSA by PCR NEGATIVE NEGATIVE Final    Comment:        The GeneXpert MRSA Assay (FDA approved for NASAL specimens only), is one component of a comprehensive MRSA colonization surveillance program. It is not intended to diagnose MRSA infection nor to guide or monitor treatment for MRSA infections.   Culture, respiratory (NON-Expectorated)     Status: None (Preliminary result)   Collection Time: 12/11/17  9:28 AM  Result Value Ref Range Status   Specimen Description TRACHEAL ASPIRATE  Final   Special Requests NONE  Final   Gram Stain   Final    MODERATE WBC PRESENT, PREDOMINANTLY PMN RARE SQUAMOUS EPITHELIAL CELLS PRESENT FEW GRAM POSITIVE RODS FEW GRAM NEGATIVE RODS    Culture CULTURE REINCUBATED FOR BETTER GROWTH   Final   Report Status PENDING  Incomplete  Respiratory Panel by PCR     Status: None   Collection Time: 12/11/17 11:53 AM  Result Value Ref Range Status   Adenovirus NOT DETECTED NOT DETECTED Final   Coronavirus 229E NOT DETECTED NOT DETECTED Final   Coronavirus HKU1 NOT DETECTED NOT DETECTED Final   Coronavirus NL63 NOT DETECTED NOT DETECTED Final   Coronavirus OC43 NOT DETECTED NOT DETECTED Final   Metapneumovirus NOT DETECTED NOT DETECTED Final   Rhinovirus / Enterovirus NOT DETECTED NOT DETECTED Final   Influenza A NOT DETECTED NOT DETECTED Final   Influenza B NOT DETECTED NOT DETECTED Final   Parainfluenza Virus 1 NOT DETECTED NOT DETECTED Final   Parainfluenza Virus 2 NOT DETECTED NOT DETECTED Final   Parainfluenza Virus 3 NOT DETECTED NOT DETECTED Final   Parainfluenza Virus 4 NOT DETECTED NOT DETECTED Final   Respiratory Syncytial Virus NOT DETECTED NOT DETECTED Final   Bordetella pertussis NOT DETECTED NOT DETECTED Final   Chlamydophila pneumoniae NOT DETECTED NOT DETECTED Final   Mycoplasma pneumoniae NOT DETECTED NOT DETECTED Final     Medications:   . amLODipine  10 mg Per Tube q morning - 10a  . aspirin  325 mg Per Tube Daily  . atorvastatin  80 mg Per Tube q1800  . baclofen  5 mg Per Tube BID  . chlorhexidine  15 mL Mouth Rinse BID  . docusate  100 mg Per Tube BID  . feeding supplement (PRO-STAT SUGAR FREE 64)  30 mL Per Tube Daily  . free water  200 mL Per Tube QID  . heparin injection (subcutaneous)  5,000 Units Subcutaneous Q8H  . insulin aspart  0-9 Units Subcutaneous Q4H  . lisinopril  20 mg Per Tube BID  . mouth rinse  15 mL Mouth Rinse q12n4p  . sennosides  5 mL Per Tube BID  . sodium chloride flush  10-40 mL Intracatheter Q12H  . zolpidem  5 mg Oral QHS   Continuous Infusions: . sodium chloride Stopped (12/05/17 1901)  . ceFEPime (MAXIPIME) IV 1 g (12/12/17  1146)  . dextrose 5 % and 0.45% NaCl 75 mL/hr at 12/12/17 0701  . feeding supplement (JEVITY  1.2 CAL) 1,000 mL (12/11/17 1316)  . vancomycin Stopped (12/12/17 0957)      LOS: 16 days   [facility] Pager [phone]  *Please refer to [email or web address], password [email or web address] to get updated schedule on who will round on this patient, as hospitalists switch teams weekly. If 7PM-7AM, please contact night-coverage at [email or web address], password [email or web address] for any overnight needs.  12/12/2017, 12:38 PM

## 2017-12-12 NOTE — Progress Notes (Signed)
Speech-Language Pathology Note:  Chart reviewed as part of trach team. Note that pt has been tolerating TC. Recommend consideration for SLP evaluation for PMV as well as cognition/language. Please order if in agreement.    [name], M.A. CCC-SLP [phone]

## 2017-12-13 LAB — GLUCOSE, CAPILLARY
GLUCOSE-CAPILLARY: 106 mg/dL — AB (ref 65–99)
GLUCOSE-CAPILLARY: 108 mg/dL — AB (ref 65–99)
GLUCOSE-CAPILLARY: 117 mg/dL — AB (ref 65–99)
GLUCOSE-CAPILLARY: 127 mg/dL — AB (ref 65–99)
GLUCOSE-CAPILLARY: 133 mg/dL — AB (ref 65–99)
Glucose-Capillary: 116 mg/dL — ABNORMAL HIGH (ref 65–99)

## 2017-12-13 LAB — CBC
HEMATOCRIT: 32 % — AB (ref 39.0–52.0)
HEMOGLOBIN: 10.6 g/dL — AB (ref 13.0–17.0)
MCH: 23.2 pg — ABNORMAL LOW (ref 26.0–34.0)
MCHC: 33.1 g/dL (ref 30.0–36.0)
MCV: 70 fL — ABNORMAL LOW (ref 78.0–100.0)
Platelets: 379 10*3/uL (ref 150–400)
RBC: 4.57 MIL/uL (ref 4.22–5.81)
RDW: 14.2 % (ref 11.5–15.5)
WBC: 7.9 10*3/uL (ref 4.0–10.5)

## 2017-12-13 LAB — CULTURE, RESPIRATORY W GRAM STAIN: Culture: NORMAL

## 2017-12-13 LAB — CULTURE, RESPIRATORY

## 2017-12-13 MED ORDER — IBUPROFEN 100 MG/5ML PO SUSP: 400.0000 mg | Freq: Four times a day (QID) | ORAL | Status: DC | PRN

## 2017-12-13 NOTE — Progress Notes (Signed)
Arrived at pt's room & could hear loud respirations. Pt needed to be suctioned. The pt was diaphoretic, tachycardic,& had increased WOB.  Suctioned pt a couple of times. He has a copious amount of thick tan secretions & had a large plug. Pt's WOB has now decreased & vitals has improved. Elisa RN notified.

## 2017-12-13 NOTE — Clinical Social Work Note (Signed)
Clinical Social Work Assessment  Patient Details  Name: [name] MRN: [id number] Date of Birth: [birth year]  Date of referral:  12/13/17               Reason for consult:  Facility Placement                Permission sought to share information with:  Facility Art therapist granted to share information::  Yes, Verbal Permission Granted  Name::     Amy Licensed conveyancer::  SNF  Relationship::  wife  Contact Information:     Housing/Transportation Living arrangements for the past 2 months:  [place] of Information:  Spouse Patient Interpreter Needed:  None Criminal Activity/Legal Involvement Pertinent to Current Situation/Hospitalization:  No - Comment as needed Significant Relationships:  Spouse, Adult Children Lives with:  Spouse Do you feel safe going back to the place where you live?  No Need for family participation in patient care:  Yes (Comment)(decision making at this time)  Care giving concerns:  Pt normally lives at home with wife and pretty independent- wife reports pt on partial disability and works part time.  Patient wife works full time and would not be available at home during the day.  Patient also has two adult sons who are willing to assist and one of which is a certified CNA at a group home.  Wife unsure if needs could be managed at home given current level of impairment and medical needs.   Social Worker assessment / plan:  CSW spoke with pt wife concerning PT and medical team recommendation for SNF at this time.  CSW explained SNF and SNF referral process.  Pt wife has negative view of SNFs and is very hesitant at idea of pt going to one.  CSW discussed pt current medical state and high level of needs. Pt wife inquired about home health services and CSW discussed limitations of what insurance would cover and the need for them to get home aid if that is what they are interested in- informed of approximate costs.  Pt wife  discussed applying for patient to get full disability and CSW suggested she go to DSS to apply for Medicaid for patient so that he might be eligible for long term care services or maybe CAPs at home if that is ultimate goal.  Employment status:  Disabled (Comment on whether or not currently receiving Disability), Part-Time(wife states he gets partial disability) Insurance information:  Managed Medicare PT Recommendations:  [place] / Referral to community resources:  [place]  Patient/Family's Response to care:  Pt wife unsure of what she wants to do at this time.  Wife very against SNF but also acknowledges that patient needs a lot of care at this time.  Wife agreeable to CSW looking into SNF options but will discuss overall plan with her sons before making any final decisions.  Patient/Family's Understanding of and Emotional Response to Diagnosis, Current Treatment, and Prognosis:  Unclear level of understanding at this time- wife is up to date on patient current medical concerns and is clearly very involved in patient care but unsure if she grasps how much assistance he is needing and possible limitations of his recovery.  Emotional Assessment Appearance:  Appears stated age Attitude/Demeanor/Rapport:  Unable to Assess Affect (typically observed):  Unable to Assess Orientation:  (unable to fully assess at this time due to trach) Alcohol / Substance use:  Not Applicable Psych involvement (Current  and /or in the community):  No (Comment)  Discharge Needs  Concerns to be addressed:  Care Coordination Readmission within the last 30 days:  No Current discharge risk:  Physical Impairment Barriers to Discharge:  Continued Medical Work up   Jorge Ny, LCSW 12/13/2017, 9:45 AM

## 2017-12-13 NOTE — Progress Notes (Signed)
RT Note: Patient was suctioned when RT came to assess the patient. A small plug was suctioned from patients trach. His RN was at bedside and RT did some tracheal suctioning teaching with her and she has no questions presently. She will pass on to the night shift RN about the patient's plugs and his increased secretions and RT will pass it on too. Rt will continue to monitor and assist as needed.

## 2017-12-13 NOTE — Progress Notes (Signed)
Pharmacy Antibiotic Note  [name] is a 57 y.o. male admitted on 11/25/2017 with CVA, now w/ concern for pneumonia  Day # 4 of Vancomycin / Cefepime Blood cultures negative MRSA PCR - Fever and WBC improved Scr stable   Plan: Continue Vancomycin 750 mg iv Q 12 -- Consider stopping as MRSA PCR - Continue Cefepime 1 gram iv q 8 hours Continue to follow - trough soon if not stopped  Height: 5\' 7"  (170.2 cm) Weight: 170 lb 3.1 oz (77.2 kg) IBW/kg (Calculated) : 66.1  Temp (24hrs), Avg:99.1 F (37.3 C), Min:98.7 F (37.1 C), Max:99.8 F (37.7 C)  Recent Labs  Lab 12/08/17 0448 12/09/17 0450 12/10/17 0259 12/11/17 0903 12/12/17 0648 12/13/17 0402  WBC  --  10.2 9.6 9.6  --  7.9  CREATININE 1.67* 1.48* 1.41* 1.31* 1.28*  --     Estimated Creatinine Clearance: 61 mL/min (A) (by C-G formula based on SCr of 1.28 mg/dL (H)).    No Known Allergies  Anette Guarneri, PharmD [phone] 12/13/2017 8:53 AM

## 2017-12-13 NOTE — Progress Notes (Signed)
RT Note: Patient still has his sutures in place. Dr. Cathlean Sauer was paged to ask about removing them and to request an order to remove the sutures. RT awaiting a response from the MD. Rt will continue to monitor.

## 2017-12-13 NOTE — NC FL2 (Signed)
[place] LEVEL OF CARE SCREENING TOOL     IDENTIFICATION  Patient Name: [name] Birthdate: 22-Aug-1961 Sex: male Admission Date (Current Location): 11/25/2017  [facility] and [place] Number:  Herbalist and Address:  [facility], [place] [address], [place], [place] [address]      Provider Number: [id number]  Attending Physician Name and Address:  Tawni Millers  Relative Name and Phone Number:  Amy, spouse, [phone]    Current Level of Care: Hospital Recommended Level of Care: [place] Prior Approval Number:    Date Approved/Denied:   PASRR Number: [id number]  Discharge Plan: SNF    Current Diagnoses: Patient Active Problem List   Diagnosis Date Noted  . Aspiration into airway   . Hypoxemia   . Tracheostomy status ([place])   . Acute respiratory failure with hypoxia ([place])   . Postextubation stridor   . Encounter for nasogastric (NG) tube placement   . Acute kidney injury ([place]) 11/28/2017  . Atelectasis, bilateral 11/28/2017  . Aspiration pneumonia ([place]) 11/27/2017  . Endotracheally intubated 11/27/2017  . Aphasia 11/26/2017  . Dysarthria   . Oropharyngeal dysphagia   . Bulbar palsy ([place])   . Stroke ([place]) 11/25/2017  . Erectile dysfunction 10/04/2016  . Exertional chest pain 07/01/2015  . Low back pain radiating to both legs 02/13/2015  . Microcytic anemia 01/08/2013  . Osteoarthritis 03/27/2012  . BPH (benign prostatic hyperplasia) 06/06/2011  . Preventative health care 06/06/2011  . History of completed stroke 05/06/2011  . DRUG ABUSE, HX OF 09/19/2006  . Hyperlipidemia 09/18/2006  . Essential hypertension, benign 09/18/2006    Orientation RESPIRATION BLADDER Height & Weight     (Trach; follows commands)  Tracheostomy(FiO2 28%) Incontinent, External catheter Weight: 77.2 kg (170 lb 3.1 oz) Height:  5\' 7"  (170.2 cm)  BEHAVIORAL SYMPTOMS/MOOD NEUROLOGICAL BOWEL NUTRITION STATUS       Incontinent Feeding tube  AMBULATORY STATUS COMMUNICATION OF NEEDS Skin   Extensive Assist Verbally Normal                       Personal Care Assistance Level of Assistance  Bathing, Feeding, Dressing Bathing Assistance: Maximum assistance Feeding assistance: Maximum assistance Dressing Assistance: Maximum assistance     Functional Limitations Info  Speech     Speech Info: Impaired    SPECIAL CARE FACTORS FREQUENCY  PT (By licensed PT)     PT Frequency: 5x/week              Contractures      Additional Factors Info  Code Status, Allergies, Psychotropic, Insulin Sliding Scale Code Status Info: Full Allergies Info: NKA Psychotropic Info: Ambien Insulin Sliding Scale Info: Every 4 hours       Current Medications (12/13/2017):  This is the current hospital active medication list Current Facility-Administered Medications  Medication Dose Route Frequency Provider Last Rate Last Dose  . 0.9 %  sodium chloride infusion  250 mL Intravenous PRN Hammonds, Sharyn Blitz, MD   Stopped at 12/05/17 1901  . acetaminophen (TYLENOL) tablet 650 mg  650 mg Oral Q4H PRN Mariel Aloe, MD   650 mg at 12/12/17 7824   Or  . acetaminophen (TYLENOL) solution 650 mg  650 mg Per Tube Q4H PRN Mariel Aloe, MD   650 mg at 12/10/17 2226   Or  . acetaminophen (TYLENOL) suppository 650 mg  650 mg Rectal Q4H PRN Mariel Aloe, MD      .  albuterol (PROVENTIL) (2.5 MG/3ML) 0.083% nebulizer solution 2.5 mg  2.5 mg Nebulization Q2H PRN Hammonds, Sharyn Blitz, MD   2.5 mg at 12/10/17 2021  . amLODipine (NORVASC) tablet 10 mg  10 mg Per Tube q morning - 10a Hammonds, Sharyn Blitz, MD   10 mg at 12/12/17 1111  . aspirin tablet 325 mg  325 mg Per Tube Daily Hammonds, Sharyn Blitz, MD   325 mg at 12/12/17 1111  . atorvastatin (LIPITOR) tablet 80 mg  80 mg Per Tube q1800 Hammonds, Sharyn Blitz, MD   80 mg at 12/12/17 1729  . baclofen (LIORESAL) tablet 5 mg  5 mg Per Tube BID Mariel Aloe, MD   5  mg at 12/12/17 2131  . carvedilol (COREG) tablet 3.125 mg  3.125 mg Oral BID WC Charlynne Cousins, MD   3.125 mg at 12/12/17 1514  . ceFEPIme (MAXIPIME) 1 g in dextrose 5 % 50 mL IVPB  1 g Intravenous Q8H Karren Cobble, [place]   Stopped at 12/12/17 1807  . chlorhexidine (PERIDEX) 0.12 % solution 15 mL  15 mL Mouth Rinse BID Hammonds, Sharyn Blitz, MD   15 mL at 12/12/17 2130  . dextrose 5 %-0.45 % sodium chloride infusion   Intravenous Continuous Charlynne Cousins, MD 10 mL/hr at 12/12/17 1245    . docusate (COLACE) 50 MG/5ML liquid 100 mg  100 mg Per Tube BID Hammonds, Sharyn Blitz, MD   100 mg at 12/12/17 2130  . feeding supplement (JEVITY 1.2 CAL) liquid 1,000 mL  1,000 mL Per Tube Continuous Sampson Goon, MD 65 mL/hr at 12/12/17 2221 1,000 mL at 12/12/17 2221  . feeding supplement (PRO-STAT SUGAR FREE 64) liquid 30 mL  30 mL Per Tube Daily Sampson Goon, MD   30 mL at 12/12/17 1110  . free water 200 mL  200 mL Per Tube QID Mariel Aloe, MD   200 mL at 12/12/17 2132  . heparin injection 5,000 Units  5,000 Units Subcutaneous Q8H Sampson Goon, MD   5,000 Units at 12/13/17 [phone]  . insulin aspart (novoLOG) injection 0-9 Units  0-9 Units Subcutaneous Q4H Sampson Goon, MD   1 Units at 12/13/17 0439  . labetalol (NORMODYNE,TRANDATE) injection 5 mg  5 mg Intravenous Q2H PRN Lovey Newcomer T, NP   5 mg at 12/11/17 2119  . lisinopril (PRINIVIL,ZESTRIL) tablet 20 mg  20 mg Per Tube BID Hammonds, Sharyn Blitz, MD   20 mg at 12/12/17 2228  . MEDLINE mouth rinse  15 mL Mouth Rinse q12n4p Hammonds, Sharyn Blitz, MD   15 mL at 12/12/17 1530  . ondansetron (ZOFRAN) injection 4 mg  4 mg Intravenous Q6H PRN Hammonds, Sharyn Blitz, MD      . sennosides (SENOKOT) 8.8 MG/5ML syrup 5 mL  5 mL Per Tube BID Hammonds, Sharyn Blitz, MD   5 mL at 12/12/17 2131  . sodium chloride flush (NS) 0.9 % injection 10-40 mL  10-40 mL Intracatheter Q12H Hammonds, Sharyn Blitz, MD   10 mL at 12/12/17 1148  . sodium chloride flush  (NS) 0.9 % injection 10-40 mL  10-40 mL Intracatheter PRN Hammonds, Sharyn Blitz, MD   10 mL at 12/12/17 1505  . vancomycin (VANCOCIN) IVPB 750 mg/150 ml premix  750 mg Intravenous Q12H Laren Everts, RPH   Stopped at 12/12/17 2230  . zolpidem (AMBIEN) tablet 5 mg  5 mg Oral QHS Mariel Aloe, MD   5 mg at 12/12/17 2131  Discharge Medications: Please see discharge summary for a list of discharge medications.  Relevant Imaging Results:  Relevant Lab Results:   Additional Information SSN: [id number] [place], [place]

## 2017-12-13 NOTE — Progress Notes (Signed)
PROGRESS NOTE    [name]  UXL:[id number] DOB: 12-Nov-1961 DOA: 11/25/2017 PCP: Tawny Asal, MD    Brief Narrative:  57 year old male who presented with difficulty speaking. He does have significant past medical history for hypertension, dyslipidemia, also substance abuse and history of CVAs with residual weakness. He had a sudden episode of unable to speak, it was persistent and recurrent. On his initial physical examination blood pressure 163/125, heart rate 93, respiratory 16, temperature 98.5, oxygen saturation 98%. Moist mucous membranes, lungs clear to auscultation bilaterally, no wheezing rales or rhonchi, heart S1-S2 present rhythmic, rales, rubs or murmurs, the abdomen was soft nontender, no lower extremity edema. Patient had dysarthria and expressive aphasia. No other focal deficit. Sodium 142, potassium 3.3, gravida 2, bicarbonate 23, glucose 119, BUN 9, creatinine 1.3, white count 5.2, hemoglobin 13.9, hematocrit 41.8, platelets 249. Urinalysis negative for infection. Head CT with acute left corona radiata/basal ganglia infarct.   Patient was admitted with working diagnosis of acute CVA.  His hospital stay was complicated by respiratory failure which required invasive mechanical ventilation. He developed ventilator dependent respiratory failure,with failure to wean, tracheostomy was performed generating 23rd, and PEG tube was placed in a 24. He was successfully liberated to trach collar, and then transferred to the medical floor.    Assessment & Plan:   Principal Problem:   Stroke [facility]) Active Problems:   Essential hypertension, benign   History of completed stroke   Microcytic anemia   Aphasia   Dysarthria   Oropharyngeal dysphagia   Bulbar palsy ([facility])   Aspiration pneumonia ([facility])   Endotracheally intubated   Acute kidney injury ([place])   Atelectasis, bilateral   Encounter for nasogastric (NG) tube placement   Postextubation stridor   Acute respiratory failure with  hypoxia ([facility])   Aspiration into airway   Hypoxemia   Tracheostomy status ([place])  1. Fever. Extensive workup has been negative for infection, will hold on broad spectrum antibiotic therapy and will continue close monitoring of cultures, cell count and temperature curve. Will add ibuprofen for temperature control. Suspected central fever.   2. Acute CVA. Patient with persistent aphasia and right hemiparesis, will continue blood pressure monitoring, antiplatelet therapy and statin. Trach care and tube feedings.   3. Respiratory failure, acute hypoxic. Patient with persistent secretions per trach, will continue oxymetry monitoring, will add chest pt. Aspiration precautions. Continue as needed albuterol.   4. Aspiration pneumonia. Chest X ray personally reviewed, noted no infiltrate, patient has completed antibiotic therapy.   5. Hypertension. Will continue blood pressure control with amlodipine, carvedilol, lisinopril and as needed labetalol.   6. Acute kidney injury. Will continue supportive medical therapy, renal function continue to be stable, will follow on renal panel in am, avoid hypotension or nephrotoxic medications.   7. T2DM. Will continue glucose cover and monitoring with insulin sliding scale.  Capillary glucose 118, 127, 116, 133, 106.   DVT prophylaxis: enoxaparin  Code Status:  full Family Communication:  No family at the bedside Disposition Plan:  ltac   Consultants:     Procedures:     Antimicrobials:       Subjective: Patient is very uncomfortable, positive secretions per trach, not able to communicate. Tolerating well tube feedings.   Objective: Vitals:   12/13/17 0332 12/13/17 0504 12/13/17 0910 12/13/17 1159  BP:  (!) 155/83    Pulse: 96 (!) 101 (!) 125 100  Resp: 18 18 (!) 30 (!) 24  Temp:  98.8 F (37.1 C)  TempSrc:  Axillary    SpO2: 97% 98% 100% 100%  Weight:  77.2 kg (170 lb 3.1 oz)    Height:        Intake/Output Summary (Last 24 hours)  at 12/13/2017 1318 Last data filed at 12/13/2017 0900 Gross per 24 hour  Intake 4215.75 ml  Output 1000 ml  Net 3215.75 ml   Filed Weights   12/10/17 0318 12/12/17 0600 12/13/17 0504  Weight: 77.5 kg (170 lb 13.7 oz) 79.7 kg (175 lb 11.3 oz) 77.2 kg (170 lb 3.1 oz)    Examination:   General: ill looking appearing, deconditioned.  Neurology: Awake, not able to communicate.  E ENT: no pallor, no icterus, oral mucosa moist. Trach in place.  Cardiovascular: No JVD. S1-S2 present, rhythmic, no gallops, rubs, or murmurs. No lower extremity edema. Pulmonary: vesicular breath sounds bilaterally, adequate air movement, no wheezing,  or rales. Scattered rhonchi.  Gastrointestinal. Abdomen flat, no organomegaly, non tender, no rebound or guarding. PEG tube in place.  Skin. No rashes Musculoskeletal: no joint deformities     Data Reviewed: I have personally reviewed following labs and imaging studies  CBC: Recent Labs  Lab 12/09/17 0450 12/10/17 0259 12/11/17 0903 12/13/17 0402  WBC 10.2 9.6 9.6 7.9  NEUTROABS 8.0*  --   --   --   HGB 10.9* 10.4* 10.2* 10.6*  HCT 34.5* 32.5* 31.9* 32.0*  MCV 72.0* 72.2* 71.4* 70.0*  PLT 288 299 329 836   Basic Metabolic Panel: Recent Labs  Lab 12/08/17 0448 12/09/17 0450 12/10/17 0259 12/11/17 0903 12/12/17 0648  NA 149* 151* 146* 145 144  K 3.4* 3.6 3.3* 3.4* 3.6  CL 115* 114* 110 109 107  CO2 25 25 25 27 26   GLUCOSE 273* 147* 164* 139* 116*  BUN 36* 32* 26* 22* 22*  CREATININE 1.67* 1.48* 1.41* 1.31* 1.28*  CALCIUM 8.4* 8.9 8.5* 8.7* 8.6*   GFR: Estimated Creatinine Clearance: 61 mL/min (A) (by C-G formula based on SCr of 1.28 mg/dL (H)). Liver Function Tests: No results for input(s): AST, ALT, ALKPHOS, BILITOT, PROT, ALBUMIN in the last 168 hours. No results for input(s): LIPASE, AMYLASE in the last 168 hours. No results for input(s): AMMONIA in the last 168 hours. Coagulation Profile: No results for input(s): INR, PROTIME in  the last 168 hours. Cardiac Enzymes: No results for input(s): CKTOTAL, CKMB, CKMBINDEX, TROPONINI in the last 168 hours. BNP (last 3 results) No results for input(s): PROBNP in the last 8760 hours. HbA1C: No results for input(s): HGBA1C in the last 72 hours. CBG: Recent Labs  Lab 12/12/17 2025 12/13/17 0005 12/13/17 0434 12/13/17 0758 12/13/17 1145  GLUCAP 109* 118* 127* 116* 133*   Lipid Profile: No results for input(s): CHOL, HDL, LDLCALC, TRIG, CHOLHDL, LDLDIRECT in the last 72 hours. Thyroid Function Tests: No results for input(s): TSH, T4TOTAL, FREET4, T3FREE, THYROIDAB in the last 72 hours. Anemia Panel: No results for input(s): VITAMINB12, FOLATE, FERRITIN, TIBC, IRON, RETICCTPCT in the last 72 hours.    Radiology Studies: I have reviewed all of the imaging during this hospital visit personally     Scheduled Meds: . amLODipine  10 mg Per Tube q morning - 10a  . aspirin  325 mg Per Tube Daily  . atorvastatin  80 mg Per Tube q1800  . baclofen  5 mg Per Tube BID  . carvedilol  3.125 mg Oral BID WC  . chlorhexidine  15 mL Mouth Rinse BID  . docusate  100 mg Per Tube  BID  . feeding supplement (PRO-STAT SUGAR FREE 64)  30 mL Per Tube Daily  . free water  200 mL Per Tube QID  . heparin injection (subcutaneous)  5,000 Units Subcutaneous Q8H  . insulin aspart  0-9 Units Subcutaneous Q4H  . lisinopril  20 mg Per Tube BID  . mouth rinse  15 mL Mouth Rinse q12n4p  . sennosides  5 mL Per Tube BID  . sodium chloride flush  10-40 mL Intracatheter Q12H  . zolpidem  5 mg Oral QHS   Continuous Infusions: . sodium chloride Stopped (12/05/17 1901)  . ceFEPime (MAXIPIME) IV Stopped (12/13/17 1017)  . dextrose 5 % and 0.45% NaCl 10 mL/hr at 12/12/17 1245  . feeding supplement (JEVITY 1.2 CAL) 1,000 mL (12/12/17 2221)  . vancomycin Stopped (12/13/17 1047)     LOS: 17 days        Mauricio Gerome Apley, MD [facility] Pager [phone]

## 2017-12-13 NOTE — Care Management Important Message (Signed)
Important Message  Patient Details  Name: [name] MRN: [id number] Date of Birth: 1961-01-14   Medicare Important Message Given:  No    [name] 12/13/2017, 1:03 PM

## 2017-12-14 DIAGNOSIS — T17908A Unspecified foreign body in respiratory tract, part unspecified causing other injury, initial encounter: Secondary | ICD-10-CM

## 2017-12-14 LAB — CULTURE, BLOOD (ROUTINE X 2)
Culture: NO GROWTH
Culture: NO GROWTH
Special Requests: ADEQUATE
Special Requests: ADEQUATE

## 2017-12-14 LAB — CBC WITH DIFFERENTIAL/PLATELET
Basophils Absolute: 0 10*3/uL (ref 0.0–0.1)
Basophils Relative: 0 %
EOS ABS: 0.3 10*3/uL (ref 0.0–0.7)
EOS PCT: 3 %
HCT: 34.1 % — ABNORMAL LOW (ref 39.0–52.0)
HEMOGLOBIN: 10.7 g/dL — AB (ref 13.0–17.0)
Lymphocytes Relative: 14 %
Lymphs Abs: 1.2 10*3/uL (ref 0.7–4.0)
MCH: 22.2 pg — ABNORMAL LOW (ref 26.0–34.0)
MCHC: 31.4 g/dL (ref 30.0–36.0)
MCV: 70.9 fL — ABNORMAL LOW (ref 78.0–100.0)
MONOS PCT: 10 %
Monocytes Absolute: 0.9 10*3/uL (ref 0.1–1.0)
NEUTROS PCT: 73 %
Neutro Abs: 6.4 10*3/uL (ref 1.7–7.7)
PLATELETS: 407 10*3/uL — AB (ref 150–400)
RBC: 4.81 MIL/uL (ref 4.22–5.81)
RDW: 14.3 % (ref 11.5–15.5)
WBC: 8.9 10*3/uL (ref 4.0–10.5)

## 2017-12-14 LAB — BASIC METABOLIC PANEL
Anion gap: 12 (ref 5–15)
BUN: 30 mg/dL — ABNORMAL HIGH (ref 6–20)
CALCIUM: 9.1 mg/dL (ref 8.9–10.3)
CO2: 26 mmol/L (ref 22–32)
CREATININE: 1.43 mg/dL — AB (ref 0.61–1.24)
Chloride: 109 mmol/L (ref 101–111)
GFR calc non Af Amer: 54 mL/min — ABNORMAL LOW (ref >60)
Glucose, Bld: 140 mg/dL — ABNORMAL HIGH (ref 65–99)
Potassium: 3.7 mmol/L (ref 3.5–5.1)
SODIUM: 147 mmol/L — AB (ref 135–145)

## 2017-12-14 LAB — GLUCOSE, CAPILLARY
GLUCOSE-CAPILLARY: 95 mg/dL (ref 65–99)
GLUCOSE-CAPILLARY: 112 mg/dL — AB (ref 65–99)
GLUCOSE-CAPILLARY: 112 mg/dL — AB (ref 65–99)
Glucose-Capillary: 128 mg/dL — ABNORMAL HIGH (ref 65–99)
Glucose-Capillary: 123 mg/dL — ABNORMAL HIGH (ref 65–99)

## 2017-12-14 NOTE — Progress Notes (Signed)
Nutrition Follow-up  DOCUMENTATION CODES:   Not applicable  INTERVENTION:  Continue TF regimen  Jevity 1.2 @ 65 ml/hr (1560 ml/day) 30 ml Prostat daily  Provides 1972 calories, 101gm protein, 1228m free water 2023mFree water QID per MD --> 206591motal free water  NUTRITION DIAGNOSIS:   Inadequate oral intake related to inability to eat as evidenced by NPO status. -ongoing  GOAL:   Patient will meet greater than or equal to 90% of their needs -met with TF  MONITOR:   TF tolerance, I & O's  Pt with PMH of HTN, CHF, hyperlipidemia, polysubstance abuse, CKD stage II, and multiple CVA. Pt admitted with stroke. Asarthria and dysphagia likely due to pseudobulbar etiology. Head CT showed possible acute left basal ganglia infarct. Emergently intubated 11/26/17 to protect airway due to difficulty managing secretions.  1/23 Trach 1/24 PEG  Tolerating TFs Wt stable Possible d/c tomorrow but wife is uncomfortable with d/c per SW note. Monitor  Labs reviewed:  Na 147, BUN/Creatinine 30/1.43,  Medications reviewed and include:  Colace, Insulin D5 1/2 NS at 10m56m --> 41 calories  Diet Order:  NPO  EDUCATION NEEDS:   Not appropriate for education at this time  Skin:  Skin Assessment: Reviewed RN Assessment  Last BM:  1/28  Height:   Ht Readings from Last 1 Encounters:  11/25/17 5' 7"  (1.702 m)    Weight:   Wt Readings from Last 1 Encounters:  12/14/17 180 lb 12.4 oz (82 kg)    Ideal Body Weight:  67.3 kg  BMI:  Body mass index is 28.31 kg/m.  Estimated Nutritional Needs:   Kcal:  1900-2100  Protein:  90-115 grams  Fluid:  > 1.9 L/day   WillSatira Anisrd, MS, RD LDN Inpatient Clinical Dietitian Pager [phone]

## 2017-12-14 NOTE — Progress Notes (Addendum)
CSW received call back from pt wife around 4pm- CSW informed that per MD patient medically stable for DC tomorrow and that we needed to discuss return home vs SNF placement.  Pt wife very surprised by this information and would want to speak with MD concerning this plan- feels as if patient is not ready for DC from hospital- she prefers to take him home but is discouraged by patient current mobility limitations and will have to discuss with family  CSW paged MD to request they call pt wife to discuss further  There have been issues finding SNF bed due to trach only being 89 week old- most facilities require 55weeks-73 days old prior to admission- CSW continuing to search  Jorge Ny, [place] Worker [phone]

## 2017-12-14 NOTE — Progress Notes (Signed)
PROGRESS NOTE    [name]  BJS:[id number] DOB: Mar 11, 1961 DOA: 11/25/2017 PCP: Tawny Asal, MD    Brief Narrative:  57 year old male who presented with difficulty speaking. He does have significant past medical history for hypertension, dyslipidemia, also substance abuse and history of CVAs with residual weakness. He had a sudden episode of unable to speak, it was persistent and recurrent. On his initial physical examination blood pressure 163/125, heart rate 93, respiratory 16, temperature 98.5, oxygen saturation 98%. Moist mucous membranes, lungs clear to auscultation bilaterally, no wheezing rales or rhonchi, heart S1-S2 present rhythmic, rales, rubs or murmurs, the abdomen was soft nontender, no lower extremity edema. Patient had dysarthria and expressive aphasia. No other focal deficit. Sodium 142, potassium 3.3, gravida 2, bicarbonate 23, glucose 119, BUN 9, creatinine 1.3, white count 5.2, hemoglobin 13.9, hematocrit 41.8, platelets 249. Urinalysis negative for infection. Head CT with acute left corona radiata/basal ganglia infarct.   Patient was admitted with working diagnosis of acute CVA.  His hospital stay was complicated by respiratory failure which required invasive mechanical ventilation. He developed ventilator dependent respiratory failure,with failure to wean, tracheostomy was performed generating 23rd, and PEG tube was placed in a 24. He was successfully liberated to trach collar, and then transferred to the medical floor.   Assessment & Plan:   Principal Problem:   Stroke [facility]) Active Problems:   Essential hypertension, benign   History of completed stroke   Microcytic anemia   Aphasia   Dysarthria   Oropharyngeal dysphagia   Bulbar palsy (HCC)   Aspiration pneumonia (HCC)   Endotracheally intubated   Acute kidney injury ([place])   Atelectasis, bilateral   Encounter for nasogastric (NG) tube placement   Postextubation stridor   Acute respiratory failure with  hypoxia (HCC)   Aspiration into airway   Hypoxemia   Tracheostomy status ([place])   1. Fever. Reactive/ central. Patient has been afebrile, last temperature spike on 01/19.  Will continue to hold on antibiotic therapy, cultures continue to be no growth. Will continue monitoring temperature curve.   2. Acute CVA. positive aphasia and right hemiparesis. Medical therapy with aspiring and statin. Continue Trach care and tube feedings.   3. Respiratory failure, acute hypoxic. Continue secretion management, frequent suctioning and chest physical therapy. This am patient had mucous plug. Will continue oxymetry monitoring and remote telemetry monitoring.   4. Aspiration pneumonia. Has resolved, completed antibiotic therapy.   5. Hypertension. Tolerating well amlodipine, carvedilol, lisinopril and as needed labetalol.   6. Acute kidney injury on CKD stage 2. Serum cr at 1,43, with K at 3,7 and serum bicarbonate at 26. Old records personally reviewed, base serum cr at 1,3 to 1,4 cw a calculated GFR of 65. Will continue close follow up of renal function and electrolytes, avoid hypotension or nephrotoxic medications.   7. T2DM. Glucose cover and monitoring with insulin sliding scale.  Capillary glucose 106, 117, 108, 128, 123, 95. Tolerating well tube feedings.   DVT prophylaxis: enoxaparin  Code Status:  full Family Communication:  No family at the bedside Disposition Plan:  ltac   Consultants:     Procedures:     Antimicrobials:       Subjective: Patient has remained afebrile, had mucous plug this am at the trach site that was removed, no nausea or vomiting and tolerating well tube feedings. Continue to be not communicative, but today looks in no distress.   Objective: Vitals:   12/14/17 6073 12/14/17 7106 12/14/17 0941 12/14/17 1203  BP:  (!) 153/99    Pulse:  (!) 103  (!) 112  Resp:  (!) 23    Temp:  99.5 F (37.5 C)    TempSrc:  Oral    SpO2: 100% 95% 95% 95%    Weight:  82 kg (180 lb 12.4 oz)    Height:        Intake/Output Summary (Last 24 hours) at 12/14/2017 1216 Last data filed at 12/14/2017 1001 Gross per 24 hour  Intake 0 ml  Output 2050 ml  Net -2050 ml   Filed Weights   12/12/17 0600 12/13/17 0504 12/14/17 0614  Weight: 79.7 kg (175 lb 11.3 oz) 77.2 kg (170 lb 3.1 oz) 82 kg (180 lb 12.4 oz)    Examination:   General: Not in pain or dyspnea, deconditioned Neurology: Awake and alert, non focal  E ENT: no pallor, no icterus, oral mucosa moist. Trach in place.  Cardiovascular: No JVD. S1-S2 present, rhythmic, no gallops, rubs, or murmurs. No lower extremity edema. Pulmonary: vesicular breath sounds bilaterally, adequate air movement, no wheezing, rhonchi or rales. Decreases inspiratory effort.  Gastrointestinal. Abdomen flat, no organomegaly, non tender, no rebound or guarding. PEG in place.  Skin. No rashes Musculoskeletal: no joint deformities     Data Reviewed: I have personally reviewed following labs and imaging studies  CBC: Recent Labs  Lab 12/09/17 0450 12/10/17 0259 12/11/17 0903 12/13/17 0402 12/14/17 0258  WBC 10.2 9.6 9.6 7.9 8.9  NEUTROABS 8.0*  --   --   --  6.4  HGB 10.9* 10.4* 10.2* 10.6* 10.7*  HCT 34.5* 32.5* 31.9* 32.0* 34.1*  MCV 72.0* 72.2* 71.4* 70.0* 70.9*  PLT 288 299 329 379 702*   Basic Metabolic Panel: Recent Labs  Lab 12/09/17 0450 12/10/17 0259 12/11/17 0903 12/12/17 0648 12/14/17 0258  NA 151* 146* 145 144 147*  K 3.6 3.3* 3.4* 3.6 3.7  CL 114* 110 109 107 109  CO2 25 25 27 26 26   GLUCOSE 637* 858* 139* 116* 140*  BUN 32* 26* 22* 22* 30*  CREATININE 1.48* 1.41* 1.31* 1.28* 1.43*  CALCIUM 8.9 8.5* 8.7* 8.6* 9.1   GFR: Estimated Creatinine Clearance: 59.9 mL/min (A) (by C-G formula based on SCr of 1.43 mg/dL (H)). Liver Function Tests: No results for input(s): AST, ALT, ALKPHOS, BILITOT, PROT, ALBUMIN in the last 168 hours. No results for input(s): LIPASE, AMYLASE in the  last 168 hours. No results for input(s): AMMONIA in the last 168 hours. Coagulation Profile: No results for input(s): INR, PROTIME in the last 168 hours. Cardiac Enzymes: No results for input(s): CKTOTAL, CKMB, CKMBINDEX, TROPONINI in the last 168 hours. BNP (last 3 results) No results for input(s): PROBNP in the last 8760 hours. HbA1C: No results for input(s): HGBA1C in the last 72 hours. CBG: Recent Labs  Lab 12/13/17 1704 12/13/17 2043 12/13/17 2352 12/14/17 0425 12/14/17 0752  GLUCAP 106* 117* 108* 128* 123*   Lipid Profile: No results for input(s): CHOL, HDL, LDLCALC, TRIG, CHOLHDL, LDLDIRECT in the last 72 hours. Thyroid Function Tests: No results for input(s): TSH, T4TOTAL, FREET4, T3FREE, THYROIDAB in the last 72 hours. Anemia Panel: No results for input(s): VITAMINB12, FOLATE, FERRITIN, TIBC, IRON, RETICCTPCT in the last 72 hours.    Radiology Studies: I have reviewed all of the imaging during this hospital visit personally     Scheduled Meds: . amLODipine  10 mg Per Tube q morning - 10a  . aspirin  325 mg Per Tube Daily  . atorvastatin  80 mg Per Tube q1800  . baclofen  5 mg Per Tube BID  . carvedilol  3.125 mg Oral BID WC  . chlorhexidine  15 mL Mouth Rinse BID  . docusate  100 mg Per Tube BID  . feeding supplement (PRO-STAT SUGAR FREE 64)  30 mL Per Tube Daily  . free water  200 mL Per Tube QID  . heparin injection (subcutaneous)  5,000 Units Subcutaneous Q8H  . insulin aspart  0-9 Units Subcutaneous Q4H  . lisinopril  20 mg Per Tube BID  . mouth rinse  15 mL Mouth Rinse q12n4p  . sennosides  5 mL Per Tube BID  . sodium chloride flush  10-40 mL Intracatheter Q12H  . zolpidem  5 mg Oral QHS   Continuous Infusions: . sodium chloride Stopped (12/05/17 1901)  . dextrose 5 % and 0.45% NaCl 10 mL/hr at 12/12/17 1245  . feeding supplement (JEVITY 1.2 CAL) 1,000 mL (12/13/17 1837)     LOS: 18 days        Mauricio Gerome Apley, MD [facility] Pager [phone]

## 2017-12-14 NOTE — Evaluation (Signed)
Passy-Muir Speaking Valve - Evaluation Patient Details  Name: [name] MRN: [id number] Date of Birth: [birth year]  Today's Date: 12/14/2017 Time: 0829-0850 SLP Time Calculation (min) (ACUTE ONLY): 21 min  Past Medical History:  Past Medical History:  Diagnosis Date  . Arthritis   . Chest pain    with a negative adenosine cardiolite in December 2006. Dr. Liana Crocker   . Cocaine abuse ([place])    last used in 2000; currently in remission; no IVDU to my knowledge  . Diastolic dysfunction    impaired L ventricle relatation with an EF of 65% and echo in December 2005  . Erectile dysfunction   . Hyperlipidemia    elevated transaminases on statin?-40's (2/06) - Hep B/C negative 4/06; resolution on 4/06 labs; off statin in 2006  . Hypertension    concentric LVH 2d echo 12/05; negative proteinuria 11/00  . 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 Onychomycosis April 2003   right big toe  . Prostate disorder    prostate irregularity with obstructive voiding symptoms (PSA 0.67); -likely related to overstimulation of alpha receptors at bladder neck; seen by Dr. Reece Agar in 9/05. no BPH at that visit  . Tinea versicolor    dermatology referral; secondary to no treatement response to selenium sulfide; currently resolive   Past Surgical History:  Past Surgical History:  Procedure Laterality Date  . COLONOSCOPY WITH PROPOFOL N/A 05/22/2013   Procedure: COLONOSCOPY WITH PROPOFOL;  Surgeon: Arta Silence, MD;  Location: [facility];  Service: Endoscopy;  Laterality: N/A;  . ESOPHAGOGASTRODUODENOSCOPY N/A 12/07/2017   Procedure: ESOPHAGOGASTRODUODENOSCOPY (EGD);  Surgeon: Georganna Skeans, MD;  Location:  [place];  Service: Endoscopy;  Laterality: N/A;  bedside  . PEG PLACEMENT N/A 12/07/2017   Procedure: PERCUTANEOUS ENDOSCOPIC GASTROSTOMY (PEG) PLACEMENT;  Surgeon: Georganna Skeans, MD;  Location: [place];  Service: Endoscopy;  Laterality: N/A;   HPI:  57 y.o. male who presented with difficulty speaking. He does have significant past medical history of CVAs with residual weakness. Head CT with acute left corona radiata/basal ganglia infarct. His hospital stay was complicated by respiratory failure which required invasive mechanical ventilation. He developed ventilator dependent respiratory failure,with failure to wean, tracheostomy was performed January 23rd, and PEG tube was placed on the 24th. He was successfully liberated to trach collar, and then transferred to the medical floor.   Assessment / Plan / Recommendation Clinical Impression   Pt presents with #6 Shiley cuffed trach.  Cuff was deflated upon therapist's arrival and pt was resting comfortably in bed.  HR 96, O2 sats WFL prior to valve placement.  Pt with labial spillage of brown, blood tinged saliva which is concerning for decreased management of secretions and poor oral hygiene .  Lips were dry, swollen, and ulcerated.  SLP performed oral care of teeth and gums with suction toothette and CHG rinse but pt would not allow visualization or care of tongue.  Pt would answer yes/no questions via head nod/shakes with max assist verbal and visual cues, but otherwise pt did not initiate any other functional communication.  Pt does not seem to have patent upper airway at this time given that he could not redirect air through the upper airway with finger occlusion or trials of speaking  valve.  Pt became tense and anxious with valve placement, heart rate increased to 126 but O2 sats remained stable. Pt could not tolerate valve for an entire breath cycle.  For now, recommend that trials of speaking valve be completed with SLP only until pt can  demonstrate improved toleration as evidenced by ability to achieve voicing and decreased anxiety.    SLP Visit Diagnosis: Aphonia (R49.1)    SLP Assessment  Patient needs continued Speech Lanaguage Pathology Services    Follow Up Recommendations  Inpatient Rehab    Frequency and Duration min 3x week       PMSV Trial PMSV was placed for: 1-2 incomplete breath cycles Able to redirect subglottic air through upper airway: No Able to Attain Phonation: No attempt to phonate Able to Expectorate Secretions: Yes Level of Secretion Expectoration with PMSV: Tracheal SpO2 During Trial: (WFL) Pulse During Trial: 126 Behavior: Anxious;No attempt to communicate;Alert   Tracheostomy Tube   #6 Shiley, cuffed    Vent Dependency  FiO2 (%): 28 %    Cuff Deflation Trial  GO Length of Time for Cuff Deflation Trial: (cuff deflated upon therapist's arrival) Behavior: Alert;Tense;Controlled        Shyleigh Daughtry, Selinda Orion 12/14/2017, 9:07 AM

## 2017-12-14 NOTE — Progress Notes (Signed)
Physical Therapy Treatment Patient Details Name: [name] MRN: [id number] DOB: [birth year] Today's Date: 12/14/2017    History of Present Illness 57 y.o.malewith hx of previous strokes, polysubstance abuse, HLD, HTN, right basal ganglia hemorrhage in 1999, and left basal ganglia hemorrhage 2005, presenting with episodicaphasia vs. dysarthria.Hedid not receive IV t-PA due to initial improvement in deficits and late presentation. 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 Patient developed severe dysphagia and was intubated on 1/13, trach 1/23, PEG 1/24    PT Comments    Patient received in bed, sleeping but easily woken and appearing willing to participate in PT today. He continues to require MaxA x2 for supine to sit/sit to supine as well as ModA sitting EOB due to posterior lean. Attempted to continue working on postural perturbations, tactile cues provided to appropriate muscles, however patient with difficulty in initiation in responding to perturbations today and requiring MaxA to maintain upright during perturbations. He was returned to bed with MaxAx2 today, bed alarm set and all needs met.     Follow Up Recommendations  Supervision/Assistance - 24 hour;SNF     Equipment Recommendations  Wheelchair (measurements PT);Wheelchair cushion (measurements PT)    Recommendations for Other Services       Precautions / Restrictions Precautions Precautions: Fall Precaution Comments: trach, peg, off vent (12/11/17) Restrictions Weight Bearing Restrictions: No    Mobility  Bed Mobility Overal bed mobility: Needs Assistance Bed Mobility: Supine to Sit;Sit to Supine Rolling: Max assist   Supine to sit: Max assist;+2 for safety/equipment Sit to supine: Max assist;+2 for safety/equipment      Transfers                 General transfer comment: DNT   Ambulation/Gait             General  Gait Details: DNT/unable    Stairs            Wheelchair Mobility    Modified Rankin (Stroke Patients Only)       Balance Overall balance assessment: Needs assistance Sitting-balance support: Feet supported;Single extremity supported Sitting balance-Leahy Scale: Poor Sitting balance - Comments: mod A to maintain sitting, posterior lean Postural control: Posterior lean   Standing balance-Leahy Scale: Zero                              Cognition Arousal/Alertness: Awake/alert Behavior During Therapy: Flat affect Overall Cognitive Status: Difficult to assess Area of Impairment: Following commands;Attention                   Current Attention Level: Focused   Following Commands: Follows one step commands inconsistently;Follows one step commands with increased time       General Comments: patient continues to have difficulty in following one step commands, approximately 25% of the time      Exercises      General Comments General comments (skin integrity, edema, etc.): attempted to continue working on postural strength and corrections, tactile cues provided to appropriate muscle groups, patient requiring Max A to maintain upright with perturbations seated       Pertinent Vitals/Pain Pain Assessment: Faces Pain Score: 0-No pain Faces Pain Scale: No hurt Pain Location: allowed passive movement of all 4 extremities without sign of pain Pain Intervention(s): Monitored during session    Home Living  Prior Function            PT Goals (current goals can now be found in the care plan section) Acute Rehab PT Goals Patient Stated Goal: none stated PT Goal Formulation: With patient Time For Goal Achievement: 12/21/17 Potential to Achieve Goals: Fair Progress towards PT goals: Progressing toward goals    Frequency    Min 3X/week      PT Plan Current plan remains appropriate    Co-evaluation               AM-PAC PT "6 Clicks" Daily Activity  Outcome Measure  Difficulty turning over in bed (including adjusting bedclothes, sheets and blankets)?: Unable Difficulty moving from lying on back to sitting on the side of the bed? : Unable Difficulty sitting down on and standing up from a chair with arms (e.g., wheelchair, bedside commode, etc,.)?: Unable Help needed moving to and from a bed to chair (including a wheelchair)?: Total Help needed walking in hospital room?: Total Help needed climbing 3-5 steps with a railing? : Total 6 Click Score: 6    End of Session Equipment Utilized During Treatment: Oxygen(trach tube ) Activity Tolerance: Patient tolerated treatment well Patient left: in bed;with bed alarm set   PT Visit Diagnosis: Other symptoms and signs involving the nervous system (R29.898);Other abnormalities of gait and mobility (R26.89);Muscle weakness (generalized) (M62.81)     Time: 1040-4591 PT Time Calculation (min) (ACUTE ONLY): 16 min  Charges:  $Therapeutic Activity: 8-22 mins                    G Codes:       Deniece Ree PT, DPT, CBIS  Supplemental Physical Therapist [place]   Pager [phone]

## 2017-12-14 NOTE — Progress Notes (Signed)
Pt. Assessed and appears in distress, diaphoretic, struggling for air and tachycardic in the 130's and saturations trending down into the 60's. Oral suctioning, Endotracheal suctioning performed by this RN and Agricultural consultant. After suctioning and Trach care with inner cannula changed, pt. respiratory status improved saturations in the 90's. Secretions were yellow with some blood tinge. Respiratory paged. MD notified.  Also noted on this RN assessment pt. Appears to have a blood in right eye, noticed at shift change. Lip is edematous and dry. MD notified about this RN assessments. Will continue to monitor.

## 2017-12-15 LAB — GLUCOSE, CAPILLARY
GLUCOSE-CAPILLARY: 124 mg/dL — AB (ref 65–99)
GLUCOSE-CAPILLARY: 132 mg/dL — AB (ref 65–99)
Glucose-Capillary: 124 mg/dL — ABNORMAL HIGH (ref 65–99)
Glucose-Capillary: 136 mg/dL — ABNORMAL HIGH (ref 65–99)
Glucose-Capillary: 137 mg/dL — ABNORMAL HIGH (ref 65–99)
Glucose-Capillary: 150 mg/dL — ABNORMAL HIGH (ref 65–99)

## 2017-12-15 NOTE — Progress Notes (Signed)
Pt seen by trach team for consult. No education needed at this time. All necessary equipment available at bedside. Will continue to monitor for progress and check back to see if family has questions. Trach care booklet and trach clinic information left at bedside.

## 2017-12-15 NOTE — Plan of Care (Signed)
Progressing

## 2017-12-15 NOTE — Consult Note (Signed)
[place] Nurse wound consult note Reason for Consult: lower lip ulcerations Wound type: device related pressure injury? Staff report from ET tube, he has been extubated now the area is resolving Pressure Injury POA: No Measurement: unable to measure due to location Wound bed: partial thickness ulceration inside lower lip Drainage (amount, consistency, odor) NA Periwound: intact  Dressing procedure/placement/frequency: No topical care needed. Perform routine oral care   Discussed POC with patient and bedside nurse.  Re consult if needed, will not follow at this time. Thanks  Brantlee Penn [employer], RN,CWOCN, CNS, [place] [phone])

## 2017-12-15 NOTE — Procedures (Signed)
First trach change  Removed cuffed 6 and replaced with a cuffless 6 trach bedside without difficulty.  Good color change and bilateral BS audible.  Rush Farmer, M.D. [facility] Pulmonary/Critical Care Medicine. Pager: [phone]. After hours pager: [phone].

## 2017-12-15 NOTE — Progress Notes (Signed)
  Speech Language Pathology Treatment: [name] Speaking valve  Patient Details Name: [name] MRN: [id number] DOB: 1961-10-09 Today's Date: 12/15/2017 Time: 7121-9758 SLP Time Calculation (min) (ACUTE ONLY): 11 min  Assessment / Plan / Recommendation Clinical Impression  Pt was seen for skilled ST targeting ongoing diagnostic treatment regarding use of Passy Muir Speaking Valve.  Upon therapist's arrival, pt was asleep but easily awakened to voice and light touch.  Despite being easy to awaken, pt was drowsy and diaphoretic throughout therapy session, HR 116 at rest, O2 sats 96.  Pt nodded yes when asked if he was hot and cold compress was applied.  Pt remains unable to tolerate speaking valve at this time due decreased patency of upper airway with significant air trapping noted upon valve removal and inability to complete a full breath cycle once valve was donned.  Suspect secretions, size of trach, and presence of cuff are all contributing factors to decreased toleration.  Oral care was completed to the best of therapist's ability as pt continues to keep teeth clenched.  Pt also continues make no attempts to communicate although will nod his head intermittently to yes/no questions.  Pt was left in bed with bed alarm set and call [name] within reach.    HPI HPI: 57 y.o. male who presented with difficulty speaking. He does have significant past medical history of CVAs with residual weakness. Head CT with acute left corona radiata/basal ganglia infarct. His hospital stay was complicated by respiratory failure which required invasive mechanical ventilation. He developed ventilator dependent respiratory failure,with failure to wean, tracheostomy was performed January 23rd, and PEG tube was placed on the 24th. He was successfully liberated to trach collar, and then transferred to the medical floor.      SLP Plan  Continue with current plan of care       Recommendations         Patient may use  Passy-Muir Speech Valve: with SLP only PMSV Supervision: Full MD: Please consider changing trach tube to : Cuffless;Smaller size         Oral Care Recommendations: Oral care QID Follow up Recommendations: Skilled Nursing facility SLP Visit Diagnosis: Aphonia (R49.1) Plan: Continue with current plan of care       GO                [name], [name] 12/15/2017, 2:18 PM

## 2017-12-15 NOTE — Progress Notes (Signed)
PROGRESS NOTE    [name]  VZD:[id number] DOB: 1961/09/30 DOA: 11/25/2017 PCP: Tawny Asal, MD    Brief Narrative:  57 year old male who presented with difficulty speaking. He does have significant past medical history for hypertension, dyslipidemia, also substance abuse and history of CVAs with residual weakness. He had a sudden episode of unable to speak, it was persistent and recurrent. On his initial physical examination blood pressure 163/125, heart rate 93, respiratory 16, temperature 98.5, oxygen saturation 98%. Moist mucous membranes, lungs clear to auscultation bilaterally, no wheezing rales or rhonchi, heart S1-S2 present rhythmic, rales, rubs or murmurs, the abdomen was soft nontender, no lower extremity edema. Patient had dysarthria and expressive aphasia. No other focal deficit. Sodium 142, potassium 3.3, gravida 2, bicarbonate 23, glucose 119, BUN 9, creatinine 1.3, white count 5.2, hemoglobin 13.9, hematocrit 41.8, platelets 249. Urinalysis negative for infection. Head CT with acute left corona radiata/basal ganglia infarct.   Patient was admitted with working diagnosis of acute CVA.  His hospital stay was complicated by respiratory failure which required invasive mechanical ventilation. He developed ventilator dependent respiratory failure,with failure to wean, tracheostomy was performed generating 23rd, and PEG tube was placed in a 24. He was successfully liberated to trach collar, and then transferred to the medical floor.   Assessment & Plan:   Principal Problem:   Stroke [facility]) Active Problems:   Essential hypertension, benign   History of completed stroke   Microcytic anemia   Aphasia   Dysarthria   Oropharyngeal dysphagia   Bulbar palsy (HCC)   Aspiration pneumonia (HCC)   Endotracheally intubated   Acute kidney injury ([place])   Atelectasis, bilateral   Encounter for nasogastric (NG) tube placement   Postextubation stridor   Acute respiratory failure with  hypoxia (HCC)   Aspiration into airway   Hypoxemia   Tracheostomy status ([place])   1. Fever. Reactive/ central.Patient continue to be afebrile, since 01/29.  No antibiotic therapy recommended.  2. Acute CVA. Persisetnt aphasia and right hemiparesis. Continue antiplatelelt therapy with aspiring and statin therapy with atorvastatin. patient is non ambulatoryu sp trach and peg.   3. Respiratory failure, acute hypoxic. Contine trach care, suctioning thick secretions, moderate in amount, still frequently, so far twice this am. Yesterday than a mucous plug. Will continue oxymetry and supplemental 02 per trach collar.   4. Aspiration pneumonia. Completed antibiotic therapy. Aspiration precautions. Continue as needed albuterol.   5. Hypertension. On amlodipine, carvedilol, lisinopril and as needed labetalol. Blood pressure systolic 295.   6. Acute kidney injury on CKD stage 2. Renal function has been stable, patient tolerating well tube feedings, will follow on renal panel in am, Free water flushes 200 q 6 hours.   7.T2DM. Continue glucose cover and monitoring with insulin sliding scale. Stable capillary glucose 112, 137, 150, 136, 124. Tolerating well tube feedings. Basal dose of insulin 5 units.   8. Lower lip edema and ulceration. Possible traumatic from ET tube, will continue conservative management, will consult wound care.  DVT prophylaxis:enoxaparin Code Status:full Family Communication:I spoke with patient's wife yesterday over the phone and all questions were addressed Disposition Plan:SNF   Consultants:    Procedures:    Antimicrobials:     Subjective: Patient comfortable in bed, not able to communicate, not in distress. No apparent pain, or dyspnea.   Objective: Vitals:   12/15/17 0438 12/15/17 0909 12/15/17 1055 12/15/17 1125  BP: (!) 131/91  (!) 138/106   Pulse: (!) 102  (!) 105   Resp:  18  20   Temp: 98.8 F (37.1 C)  99.9 F (37.7 C)     TempSrc: Axillary  Oral   SpO2: 99% 96% 96% 96%  Weight: 76.3 kg (168 lb 3.4 oz)     Height:        Intake/Output Summary (Last 24 hours) at 12/15/2017 1207 Last data filed at 12/15/2017 0841 Gross per 24 hour  Intake 1120 ml  Output 700 ml  Net 420 ml   Filed Weights   12/13/17 0504 12/14/17 0614 12/15/17 0438  Weight: 77.2 kg (170 lb 3.1 oz) 82 kg (180 lb 12.4 oz) 76.3 kg (168 lb 3.4 oz)    Examination:   General: Not in pain or dyspnea, deconditioned Neurology: Awake and alert, non focal  E ENT: no pallor, no icterus, oral mucosa moist, trach in place. Lower lip edema, with ulcerated lesions on the right side.  Cardiovascular: No JVD. S1-S2 present, rhythmic, no gallops, rubs, or murmurs. No lower extremity edema. Pulmonary: vesicular breath sounds bilaterally, adequate air movement, no wheezing, rhonchi or rales. Gastrointestinal. Abdomen flat, no organomegaly, non tender, no rebound or guarding Skin. No rashes Musculoskeletal: no joint deformities     Data Reviewed: I have personally reviewed following labs and imaging studies  CBC: Recent Labs  Lab 12/09/17 0450 12/10/17 0259 12/11/17 0903 12/13/17 0402 12/14/17 0258  WBC 10.2 9.6 9.6 7.9 8.9  NEUTROABS 8.0*  --   --   --  6.4  HGB 10.9* 10.4* 10.2* 10.6* 10.7*  HCT 34.5* 32.5* 31.9* 32.0* 34.1*  MCV 72.0* 72.2* 71.4* 70.0* 70.9*  PLT 288 299 329 379 270*   Basic Metabolic Panel: Recent Labs  Lab 12/09/17 0450 12/10/17 0259 12/11/17 0903 12/12/17 0648 12/14/17 0258  NA 151* 146* 145 144 147*  K 3.6 3.3* 3.4* 3.6 3.7  CL 114* 110 109 107 109  CO2 25 25 27 26 26   GLUCOSE 147* 164* 139* 116* 140*  BUN 32* 26* 22* 22* 30*  CREATININE 1.48* 1.41* 1.31* 1.28* 1.43*  CALCIUM 8.9 8.5* 8.7* 8.6* 9.1   GFR: Estimated Creatinine Clearance: 54.6 mL/min (A) (by C-G formula based on SCr of 1.43 mg/dL (H)). Liver Function Tests: No results for input(s): AST, ALT, ALKPHOS, BILITOT, PROT, ALBUMIN in the last  168 hours. No results for input(s): LIPASE, AMYLASE in the last 168 hours. No results for input(s): AMMONIA in the last 168 hours. Coagulation Profile: No results for input(s): INR, PROTIME in the last 168 hours. Cardiac Enzymes: No results for input(s): CKTOTAL, CKMB, CKMBINDEX, TROPONINI in the last 168 hours. BNP (last 3 results) No results for input(s): PROBNP in the last 8760 hours. HbA1C: No results for input(s): HGBA1C in the last 72 hours. CBG: Recent Labs  Lab 12/14/17 2046 12/15/17 0028 12/15/17 0435 12/15/17 0737 12/15/17 1145  GLUCAP 112* 137* 150* 136* 124*   Lipid Profile: No results for input(s): CHOL, HDL, LDLCALC, TRIG, CHOLHDL, LDLDIRECT in the last 72 hours. Thyroid Function Tests: No results for input(s): TSH, T4TOTAL, FREET4, T3FREE, THYROIDAB in the last 72 hours. Anemia Panel: No results for input(s): VITAMINB12, FOLATE, FERRITIN, TIBC, IRON, RETICCTPCT in the last 72 hours.    Radiology Studies: I have reviewed all of the imaging during this hospital visit personally     Scheduled Meds: . amLODipine  10 mg Per Tube q morning - 10a  . aspirin  325 mg Per Tube Daily  . atorvastatin  80 mg Per Tube q1800  . baclofen  5 mg  Per Tube BID  . carvedilol  3.125 mg Oral BID WC  . chlorhexidine  15 mL Mouth Rinse BID  . docusate  100 mg Per Tube BID  . feeding supplement (PRO-STAT SUGAR FREE 64)  30 mL Per Tube Daily  . free water  200 mL Per Tube QID  . heparin injection (subcutaneous)  5,000 Units Subcutaneous Q8H  . insulin aspart  0-9 Units Subcutaneous Q4H  . lisinopril  20 mg Per Tube BID  . mouth rinse  15 mL Mouth Rinse q12n4p  . sennosides  5 mL Per Tube BID  . sodium chloride flush  10-40 mL Intracatheter Q12H  . zolpidem  5 mg Oral QHS   Continuous Infusions: . sodium chloride Stopped (12/05/17 1901)  . dextrose 5 % and 0.45% NaCl 10 mL/hr at 12/12/17 1245  . feeding supplement (JEVITY 1.2 CAL) 1,000 mL (12/15/17 0254)     LOS: 19  days        Marci Polito Gerome Apley, MD [facility] Pager [phone]

## 2017-12-15 NOTE — Progress Notes (Signed)
Assist MD with trach change to #6 cuffless trach.  Changed w/o difficulty. SpO2 remained at baseline, bilateral breath sounds noted.  Color change with ETCO2 detector.  Small amount of blood ooze initially. VSS

## 2017-12-15 NOTE — Progress Notes (Signed)
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  He subsequently had difficulty managing his secretions and required intubation on the day of admission.  CT scan showed stenosis of M1 and M2 segments on the left and there was infarction in the coronal radiata.  He was extubated on 1/19 but developed stridor and required reintubation.       Percutaneous tracheostomy was performed 1/23 and PEG tube was placed on 1/24.  He was placed on a trach collar yesterday afternoon and this has been well-tolerated overnight.  He has continuous eye opening but is not at all interactive.     SUBJECTIVE:  No events overnight, tolerating trach collar 24/7  VITAL SIGNS: BP (!) 145/99 (BP Location: Left Arm)   Pulse (!) 109   Temp 99.7 F (37.6 C) (Oral)   Resp 16   Ht 5\' 7"  (1.702 m)   Wt 76.3 kg (168 lb 3.4 oz)   SpO2 96%   BMI 26.35 kg/m   HEMODYNAMICS:    VENTILATOR SETTINGS: FiO2 (%):  [28 %] 28 %  INTAKE / OUTPUT: I/O last 3 completed shifts: In: 1110 [I.V.:130; NG/GT:980] Out: 1800 [Urine:1800]  PHYSICAL EXAMINATION: General: Chronically ill appearing male, NAD Neuro: Spontaneous eye movement, not tracking and not following any commands Cardiovascular: RRR, NL S1/S2 and -M/R/G Lungs: Trach midline, coarse BS bilaterally. Abdomen: Soft, NT, ND and +BS Ext: -edema and -tenderness  LABS:  BMET Recent Labs  Lab 12/11/17 0903 12/12/17 0648 12/14/17 0258  NA 145 144 147*  K 3.4* 3.6 3.7  CL 109 107 109  CO2 27 26 26   BUN 22* 22* 30*  CREATININE 1.31* 1.28* 1.43*  GLUCOSE 139* 116* 140*    Electrolytes Recent Labs  Lab 12/11/17 0903 12/12/17 0648  12/14/17 0258  CALCIUM 8.7* 8.6* 9.1    CBC Recent Labs  Lab 12/11/17 0903 12/13/17 0402 12/14/17 0258  WBC 9.6 7.9 8.9  HGB 10.2* 10.6* 10.7*  HCT 31.9* 32.0* 34.1*  PLT 329 379 407*    Coag's No results for input(s): APTT, INR in the last 168 hours.  Sepsis Markers Recent Labs  Lab 12/11/17 0405  PROCALCITON 0.17    ABG No results for input(s): PHART, PCO2ART, PO2ART in the last 168 hours.  Liver Enzymes No results for input(s): AST, ALT, ALKPHOS, BILITOT, ALBUMIN in the last 168 hours.  Cardiac Enzymes No results for input(s): TROPONINI, PROBNP in the last 168 hours.  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  Difficulty maintaining his airway and failed extubation, percutaneous tracheostomy was performed on 1/23.  PEG tube was placed  on 1/24.  Discussed with PCCM-NP  ASSESSMENT / PLAN:  Chronic respiratory failure:  - SLP as able  - ?PMV  -  No decannulation given neuro status  Hypoxemia:  - Titrate O2 for sat of 88-92%  - Maintain at 28%  Trach status:  - Change trach to a cuffless 6  Rush Farmer, M.D. [facility] Pulmonary/Critical Care Medicine. Pager: [phone]. After hours pager: [phone].  12/15/2017, 5:09 PM

## 2017-12-16 LAB — BASIC METABOLIC PANEL
Anion gap: 9 (ref 5–15)
BUN: 35 mg/dL — AB (ref 6–20)
CHLORIDE: 111 mmol/L (ref 101–111)
CO2: 28 mmol/L (ref 22–32)
CREATININE: 1.63 mg/dL — AB (ref 0.61–1.24)
Calcium: 9 mg/dL (ref 8.9–10.3)
GFR calc Af Amer: 53 mL/min — ABNORMAL LOW (ref >60)
GFR calc non Af Amer: 46 mL/min — ABNORMAL LOW (ref >60)
Glucose, Bld: 146 mg/dL — ABNORMAL HIGH (ref 65–99)
Potassium: 3.7 mmol/L (ref 3.5–5.1)
SODIUM: 148 mmol/L — AB (ref 135–145)

## 2017-12-16 LAB — GLUCOSE, CAPILLARY
GLUCOSE-CAPILLARY: 104 mg/dL — AB (ref 65–99)
GLUCOSE-CAPILLARY: 124 mg/dL — AB (ref 65–99)
GLUCOSE-CAPILLARY: 125 mg/dL — AB (ref 65–99)
Glucose-Capillary: 111 mg/dL — ABNORMAL HIGH (ref 65–99)
Glucose-Capillary: 110 mg/dL — ABNORMAL HIGH (ref 65–99)
Glucose-Capillary: 113 mg/dL — ABNORMAL HIGH (ref 65–99)
Glucose-Capillary: 127 mg/dL — ABNORMAL HIGH (ref 65–99)

## 2017-12-16 MED ORDER — FREE WATER
300.0000 mL | Freq: Four times a day (QID) | Status: DC
  Administered 2017-12-16 – 2017-12-18 (×7): 300 mL

## 2017-12-16 MED ORDER — CARVEDILOL 3.125 MG PO TABS
3.1250 mg | ORAL_TABLET | Freq: Two times a day (BID) | ORAL | Status: DC
  Administered 2017-12-16 – 2017-12-22 (×11): 3.125 mg
  Filled 2017-12-16 (×13): qty 1

## 2017-12-16 NOTE — Progress Notes (Signed)
PROGRESS NOTE    [name]  OIZ:[id number] DOB: [birth year] DOA: 11/25/2017 PCP: Tawny Asal, MD    Brief Narrative:  57 year old male who presented with difficulty speaking. He does have significant past medical history for hypertension, dyslipidemia, also substance abuse and history of CVAs with residual weakness. He had a sudden episode of unable to speak, it was persistent and recurrent. On his initial physical examination blood pressure 163/125, heart rate 93, respiratory 16, temperature 98.5, oxygen saturation 98%. Moist mucous membranes, lungs clear to auscultation bilaterally, no wheezing rales or rhonchi, heart S1-S2 present rhythmic, rales, rubs or murmurs, the abdomen was soft nontender, no lower extremity edema. Patient had dysarthria and expressive aphasia. No other focal deficit. Sodium 142, potassium 3.3, gravida 2, bicarbonate 23, glucose 119, BUN 9, creatinine 1.3, white count 5.2, hemoglobin 13.9, hematocrit 41.8, platelets 249. Urinalysis negative for infection. Head CT with acute left corona radiata/basal ganglia infarct.   Patient was admitted with working diagnosis of acute CVA.  His hospital stay was complicated by respiratory failure which required invasive mechanical ventilation. He developed ventilator dependent respiratory failure,with failure to wean, tracheostomy was performed generating 23rd, and PEG tube was placed in a 24. He was successfully liberated to trach collar, and then transferred to the medical floor.  Assessment & Plan:   Principal Problem:   Stroke [facility]) Active Problems:   Essential hypertension, benign   History of completed stroke   Microcytic anemia   Aphasia   Dysarthria   Oropharyngeal dysphagia   Bulbar palsy (HCC)   Aspiration pneumonia (HCC)   Endotracheally intubated   Acute kidney injury ([place])   Atelectasis, bilateral   Encounter for nasogastric (NG) tube placement   Postextubation stridor   Acute respiratory failure with  hypoxia (HCC)   Aspiration into airway   Hypoxemia   Tracheostomy status ([place])   1. Fever. Reactive/ central.Continue to be afebrile, since 01/29. Holding antibiotic therapy.  2. Acute CVA.Persisetntaphasia and right hemiparesis/ not able to communicate. On aspiringand atorvastatin.  3. Respiratory failure, acute hypoxic.Trach care, continue oxymetry and supplemental 02 per trach collar. Exchanged to cuff less #6. Patient continue to work with speech therapy PMV.   4. Aspiration pneumonia. Resolved.Continue aspiration precautions. As needed albuterol nebs.   5. Hypertension.Continue amlodipine, carvedilol, lisinopril for blood pressure control.  6. Acute kidney injuryon CKD stage 2.Will increase free water flushes to 300 q 6 hours, for a total of 1,200 ml supplemental fluid. Likely patient with increased insensible losses per airway. Serum cr up to 1,6 and sodium 148.  7.T2DM.Gucose cover and monitoring with insulin sliding scale plus basal dose of insulin. Stable capillary glucose124, 110, 110, 113. Continue tube feedings.  8. Lower lip edema and ulceration. Likely traumatic from ET tube,  It has been stable and not worsening, will continue conservative management, per wound care team recommendations.  DVT prophylaxis:enoxaparin Code Status:full Family Communication:no family at bedside.  Disposition Plan:SNF   Consultants:    Procedures:    Antimicrobials:     Subjective: Patient in no active distress, still not able to communicate, tolerating well tube feedings and trach care.   Objective: Vitals:   12/16/17 0811 12/16/17 0911 12/16/17 1033 12/16/17 1139  BP: 117/87  127/87   Pulse: (!) 107 (!) 108  (!) 124  Resp:  (!) 22  (!) 22  Temp:      TempSrc:      SpO2:  97%  94%  Weight:      Height:  Intake/Output Summary (Last 24 hours) at 12/16/2017 1234 Last data filed at 12/16/2017 0618 Gross per 24 hour  Intake  2038.75 ml  Output 1150 ml  Net 888.75 ml   Filed Weights   12/14/17 0614 12/15/17 0438 12/16/17 0454  Weight: 82 kg (180 lb 12.4 oz) 76.3 kg (168 lb 3.4 oz) 77.8 kg (171 lb 8.3 oz)    Examination:   General: Not in pain or dyspnea, deconditioned Neurology: Awake, non able to communicate. Left upper extremity paresis.  E ENT: no pallor, no icterus, oral mucosa moist, trach in place Cardiovascular: No JVD. S1-S2 present, rhythmic, no gallops, rubs, or murmurs. No lower extremity edema. Pulmonary: positive breath sounds bilaterally, adequate air movement, no wheezing, positive scattered proximal rhonchi but no rales. Gastrointestinal. Abdomen flat, no organomegaly, non tender, no rebound or guarding. PEG in place.  Skin. No rashes Musculoskeletal: no joint deformities     Data Reviewed: I have personally reviewed following labs and imaging studies  CBC: Recent Labs  Lab 12/10/17 0259 12/11/17 0903 12/13/17 0402 12/14/17 0258  WBC 9.6 9.6 7.9 8.9  NEUTROABS  --   --   --  6.4  HGB 10.4* 10.2* 10.6* 10.7*  HCT 32.5* 31.9* 32.0* 34.1*  MCV 72.2* 71.4* 70.0* 70.9*  PLT 299 329 379 638*   Basic Metabolic Panel: Recent Labs  Lab 12/10/17 0259 12/11/17 0903 12/12/17 0648 12/14/17 0258 12/16/17 0440  NA 146* 145 144 147* 148*  K 3.3* 3.4* 3.6 3.7 3.7  CL 110 109 107 109 111  CO2 25 27 26 26 28   GLUCOSE 164* 139* 116* 140* 146*  BUN 26* 22* 22* 30* 35*  CREATININE 1.41* 1.31* 1.28* 1.43* 1.63*  CALCIUM 8.5* 8.7* 8.6* 9.1 9.0   GFR: Estimated Creatinine Clearance: 47.9 mL/min (A) (by C-G formula based on SCr of 1.63 mg/dL (H)). Liver Function Tests: No results for input(s): AST, ALT, ALKPHOS, BILITOT, PROT, ALBUMIN in the last 168 hours. No results for input(s): LIPASE, AMYLASE in the last 168 hours. No results for input(s): AMMONIA in the last 168 hours. Coagulation Profile: No results for input(s): INR, PROTIME in the last 168 hours. Cardiac Enzymes: No results  for input(s): CKTOTAL, CKMB, CKMBINDEX, TROPONINI in the last 168 hours. BNP (last 3 results) No results for input(s): PROBNP in the last 8760 hours. HbA1C: No results for input(s): HGBA1C in the last 72 hours. CBG: Recent Labs  Lab 12/15/17 1958 12/16/17 0012 12/16/17 0404 12/16/17 0742 12/16/17 1215  GLUCAP 124* 124* 111* 110* 113*   Lipid Profile: No results for input(s): CHOL, HDL, LDLCALC, TRIG, CHOLHDL, LDLDIRECT in the last 72 hours. Thyroid Function Tests: No results for input(s): TSH, T4TOTAL, FREET4, T3FREE, THYROIDAB in the last 72 hours. Anemia Panel: No results for input(s): VITAMINB12, FOLATE, FERRITIN, TIBC, IRON, RETICCTPCT in the last 72 hours.    Radiology Studies: I have reviewed all of the imaging during this hospital visit personally     Scheduled Meds: . amLODipine  10 mg Per Tube q morning - 10a  . aspirin  325 mg Per Tube Daily  . atorvastatin  80 mg Per Tube q1800  . baclofen  5 mg Per Tube BID  . carvedilol  3.125 mg Per Tube BID WC  . chlorhexidine  15 mL Mouth Rinse BID  . docusate  100 mg Per Tube BID  . feeding supplement (PRO-STAT SUGAR FREE 64)  30 mL Per Tube Daily  . free water  200 mL Per Tube QID  .  heparin injection (subcutaneous)  5,000 Units Subcutaneous Q8H  . insulin aspart  0-9 Units Subcutaneous Q4H  . lisinopril  20 mg Per Tube BID  . mouth rinse  15 mL Mouth Rinse q12n4p  . sennosides  5 mL Per Tube BID  . sodium chloride flush  10-40 mL Intracatheter Q12H  . zolpidem  5 mg Oral QHS   Continuous Infusions: . sodium chloride Stopped (12/05/17 1901)  . dextrose 5 % and 0.45% NaCl 10 mL/hr at 12/12/17 1245  . feeding supplement (JEVITY 1.2 CAL) 65 mL/hr (12/15/17 2115)     LOS: 20 days        Mauricio Gerome Apley, MD [facility] Pager [phone]

## 2017-12-16 NOTE — Progress Notes (Signed)
  Speech Language Pathology Treatment: Nada Boozer Speaking valve  Patient Details Name: [name] MRN: [id number] DOB: Apr 07, 1961 Today's Date: 12/16/2017 Time: 1045-1100 SLP Time Calculation (min) (ACUTE ONLY): 15 min  Assessment / Plan / Recommendation Clinical Impression  Patient seen for skilled ST targeting ongoing diagnostic treatment for pt's ability to use PMSV. Pt resting upon SLP arrival but with min verbal cues, awakens and makes eye contact with SLP. Lips swollen and ulcerated; oral care initiated and pt allows care of teeth, gums however pt is either unable or unwilling to open his jaw to allow for thorough cleaning of tongue, palate. Trach changed yesterday to #6 cuffless. At baseline Sp02 readings in low 90s, HR 102;  thick yellow secretions draining from trach. With finger occlusion, pt makes no efforts to phonate despite max A. Placed valve initially for single breath cycle, which pt completed with no signs of respiratory distress. SLP then placed valve for several trials of 3-5 breath cycles, during which pt's vitals remained stable, no anxiety or respiratory distress noted, and no signs of air trapping with removal. He does nod/shake his head slightly in response to Y/N questions (mod A), however no attempts at phonation despite max cues. Ceased PMSV trials as pt became lethargic, closing his eyes and unable to sustain alertness. I do suspect upper airway patency has improved with cuffless trach, however further assessment required to determine pt's ability to tolerate PMSV for longer periods. Will continue to follow up. Recommend PMSV use with SLP only at this time.    HPI HPI: 57 y.o. male who presented with difficulty speaking. He does have significant past medical history of CVAs with residual weakness. Head CT with acute left corona radiata/basal ganglia infarct. His hospital stay was complicated by respiratory failure which required invasive mechanical ventilation. He  developed ventilator dependent respiratory failure,with failure to wean, tracheostomy was performed January 23rd, and PEG tube was placed on the 24th. He was successfully liberated to trach collar, and then transferred to the medical floor.      SLP Plan  Continue with current plan of care  Patient needs continued Speech Lanaguage Pathology Services    Recommendations         Patient may use Passy-Muir Speech Valve: with SLP only PMSV Supervision: Full MD: Please consider changing trach tube to : Smaller size         Oral Care Recommendations: Oral care QID Follow up Recommendations: Skilled Nursing facility SLP Visit Diagnosis: Aphonia (R49.1) Plan: Continue with current plan of care       [place], [place], [place] Speech-Language Pathologist [employer] 12/16/2017, 11:09 AM

## 2017-12-17 LAB — GLUCOSE, CAPILLARY
Glucose-Capillary: 108 mg/dL — ABNORMAL HIGH (ref 65–99)
Glucose-Capillary: 134 mg/dL — ABNORMAL HIGH (ref 65–99)
Glucose-Capillary: 109 mg/dL — ABNORMAL HIGH (ref 65–99)
Glucose-Capillary: 110 mg/dL — ABNORMAL HIGH (ref 65–99)
Glucose-Capillary: 126 mg/dL — ABNORMAL HIGH (ref 65–99)

## 2017-12-17 MED ORDER — HEPARIN SOD (PORK) LOCK FLUSH 100 UNIT/ML IV SOLN
250.0000 [IU] | INTRAVENOUS | Status: DC | PRN
  Administered 2017-12-17: 250 [IU]

## 2017-12-17 MED ORDER — ACETAMINOPHEN 500 MG PO TABS
500.0000 mg | ORAL_TABLET | Freq: Three times a day (TID) | ORAL | Status: DC
  Administered 2017-12-17 – 2017-12-22 (×15): 500 mg via ORAL
  Filled 2017-12-17 (×15): qty 1

## 2017-12-17 MED ORDER — HEPARIN SOD (PORK) LOCK FLUSH 100 UNIT/ML IV SOLN
250.0000 [IU] | Freq: Every day | INTRAVENOUS | Status: DC
  Administered 2017-12-18 – 2017-12-21 (×4): 250 [IU]
  Filled 2017-12-17 (×6): qty 2.5

## 2017-12-17 MED ORDER — WHITE PETROLATUM EX OINT
TOPICAL_OINTMENT | CUTANEOUS | Status: AC
  Administered 2017-12-17: 20:00:00
  Filled 2017-12-17: qty 28.35

## 2017-12-17 NOTE — Progress Notes (Signed)
PROGRESS NOTE    [name]  NGE:[id number] DOB: June 14, 1961 DOA: 11/25/2017 PCP: Tawny Asal, MD    Brief Narrative:  57 year old male who presented with difficulty speaking. He does have significant past medical history for hypertension, dyslipidemia, also substance abuse and history of CVAs with residual weakness. He had a sudden episode of unable to speak, it was persistent and recurrent. On his initial physical examination blood pressure 163/125, heart rate 93, respiratory 16, temperature 98.5, oxygen saturation 98%. Moist mucous membranes, lungs clear to auscultation bilaterally, no wheezing rales or rhonchi, heart S1-S2 present rhythmic, rales, rubs or murmurs, the abdomen was soft nontender, no lower extremity edema. Patient had dysarthria and expressive aphasia. No other focal deficit. Sodium 142, potassium 3.3, gravida 2, bicarbonate 23, glucose 119, BUN 9, creatinine 1.3, white count 5.2, hemoglobin 13.9, hematocrit 41.8, platelets 249. Urinalysis negative for infection. Head CT with acute left corona radiata/basal ganglia infarct.   Patient was admitted with working diagnosis of acute CVA.  His hospital stay was complicated by respiratory failure which required invasive mechanical ventilation. He developed ventilator dependent respiratory failure,with failure to wean, tracheostomy was performed generating 23rd, and PEG tube was placed in a 24. He was successfully liberated to trach collar, and then transferred to the medical floor.   Assessment & Plan:   Principal Problem:   Stroke [facility]) Active Problems:   Essential hypertension, benign   History of completed stroke   Microcytic anemia   Aphasia   Dysarthria   Oropharyngeal dysphagia   Bulbar palsy (HCC)   Aspiration pneumonia (HCC)   Endotracheally intubated   Acute kidney injury ([place])   Atelectasis, bilateral   Encounter for nasogastric (NG) tube placement   Postextubation stridor   Acute respiratory failure  with hypoxia (HCC)   Aspiration into airway   Hypoxemia   Tracheostomy status ([place])  1. Fever. Reactive/ central.T max at 102 at 1505 on 12/16/17, clinically no signs of infection, improved secretions per trach. Will continue antipyretics as needed and will add tid acetaminophen scheduled, trial for the next 24 to 48 hours.   2. Acute CVA/ left subcortical.neurologi deficit withaphasia and left hemiparesis, continue antiplatelet therapy with aspiringand statin therapy with atorvastatin.  3. Respiratory failure, acute hypoxic.Now with cuff less #6 Trach. Note string cough and able to manage secretion better. Speech therapy PMV and physical therapy, patient will need SNF.   4. Aspiration pneumonia. Aspiration precautions. No further antibiotic therapy needed. Continue chest pt.   5. Hypertension.On amlodipine, carvedilol, lisinopril for blood pressure control. Systolic blood pressure 401 to 140.   6. Acute kidney injuryon CKD stage 2.Increased free water flushes to 300 q 6 hours, per peg tube for a total of 1,200 ml of supplemental fluid. Will follow on renal panel in am.  7.T2DM.Capillary glucose127, 104, 108, 109. Tolerating well tube feedings, will continue insulin sliding scale for glucose cover and monitoring.  8. Lower lip edema and ulceration. Clinically improved edema,   DVT prophylaxis:enoxaparin Code Status:full Family Communication:no family at bedside.  Disposition Plan:SNF   Consultants:    Procedures:    Antimicrobials:    Subjective: Patient is more comfortable today, able to track with his eyes, not following commands. Noted to have strong cough and able to manage secretions better.   Objective: Vitals:   12/17/17 0418 12/17/17 0834 12/17/17 0907 12/17/17 1011  BP: (!) 150/95 (!) 128/95  (!) 141/92  Pulse: (!) 113 (!) 103 (!) 113   Resp: 18  17  Temp: 100 F (37.8 C)     TempSrc: Axillary     SpO2: 95%  98%   Weight:  79.6 kg (175 lb 7.8 oz)     Height:        Intake/Output Summary (Last 24 hours) at 12/17/2017 1306 Last data filed at 12/17/2017 0700 Gross per 24 hour  Intake 1050 ml  Output 900 ml  Net 150 ml   Filed Weights   12/15/17 0438 12/16/17 0454 12/17/17 0418  Weight: 76.3 kg (168 lb 3.4 oz) 77.8 kg (171 lb 8.3 oz) 79.6 kg (175 lb 7.8 oz)    Examination:   General: Not in pain or dyspnea, deconditioned Neurology: Awake and alert, left hemiparesis,  E ENT: no pallor, no icterus, oral mucosa moist Cardiovascular: No JVD. S1-S2 present, rhythmic, no gallops, rubs, or murmurs. No lower extremity edema. Pulmonary: vesicular breath sounds bilaterally, adequate air movement, no wheezing, rhonchi or rales. Gastrointestinal. Abdomen flat, no organomegaly, non tender, no rebound or guarding Skin. No rashes Musculoskeletal: no joint deformities     Data Reviewed: I have personally reviewed following labs and imaging studies  CBC: Recent Labs  Lab 12/11/17 0903 12/13/17 0402 12/14/17 0258  WBC 9.6 7.9 8.9  NEUTROABS  --   --  6.4  HGB 10.2* 10.6* 10.7*  HCT 31.9* 32.0* 34.1*  MCV 71.4* 70.0* 70.9*  PLT 329 379 144*   Basic Metabolic Panel: Recent Labs  Lab 12/11/17 0903 12/12/17 0648 12/14/17 0258 12/16/17 0440  NA 145 144 147* 148*  K 3.4* 3.6 3.7 3.7  CL 109 107 109 111  CO2 27 26 26 28   GLUCOSE 139* 116* 140* 146*  BUN 22* 22* 30* 35*  CREATININE 1.31* 1.28* 1.43* 1.63*  CALCIUM 8.7* 8.6* 9.1 9.0   GFR: Estimated Creatinine Clearance: 51.8 mL/min (A) (by C-G formula based on SCr of 1.63 mg/dL (H)). Liver Function Tests: No results for input(s): AST, ALT, ALKPHOS, BILITOT, PROT, ALBUMIN in the last 168 hours. No results for input(s): LIPASE, AMYLASE in the last 168 hours. No results for input(s): AMMONIA in the last 168 hours. Coagulation Profile: No results for input(s): INR, PROTIME in the last 168 hours. Cardiac Enzymes: No results for input(s): CKTOTAL, CKMB,  CKMBINDEX, TROPONINI in the last 168 hours. BNP (last 3 results) No results for input(s): PROBNP in the last 8760 hours. HbA1C: No results for input(s): HGBA1C in the last 72 hours. CBG: Recent Labs  Lab 12/16/17 2043 12/16/17 2348 12/17/17 0414 12/17/17 0828 12/17/17 1152  GLUCAP 127* 104* 108* 134* 109*   Lipid Profile: No results for input(s): CHOL, HDL, LDLCALC, TRIG, CHOLHDL, LDLDIRECT in the last 72 hours. Thyroid Function Tests: No results for input(s): TSH, T4TOTAL, FREET4, T3FREE, THYROIDAB in the last 72 hours. Anemia Panel: No results for input(s): VITAMINB12, FOLATE, FERRITIN, TIBC, IRON, RETICCTPCT in the last 72 hours.    Radiology Studies: I have reviewed all of the imaging during this hospital visit personally     Scheduled Meds: . amLODipine  10 mg Per Tube q morning - 10a  . aspirin  325 mg Per Tube Daily  . atorvastatin  80 mg Per Tube q1800  . baclofen  5 mg Per Tube BID  . carvedilol  3.125 mg Per Tube BID WC  . chlorhexidine  15 mL Mouth Rinse BID  . docusate  100 mg Per Tube BID  . feeding supplement (PRO-STAT SUGAR FREE 64)  30 mL Per Tube Daily  . free water  300  mL Per Tube QID  . heparin injection (subcutaneous)  5,000 Units Subcutaneous Q8H  . insulin aspart  0-9 Units Subcutaneous Q4H  . lisinopril  20 mg Per Tube BID  . mouth rinse  15 mL Mouth Rinse q12n4p  . sennosides  5 mL Per Tube BID  . sodium chloride flush  10-40 mL Intracatheter Q12H  . zolpidem  5 mg Oral QHS   Continuous Infusions: . sodium chloride Stopped (12/05/17 1901)  . dextrose 5 % and 0.45% NaCl 10 mL/hr at 12/12/17 1245  . feeding supplement (JEVITY 1.2 CAL) 1,000 mL (12/17/17 1011)     LOS: 21 days        Rebecah Dangerfield Gerome Apley, MD [facility] Pager [phone]

## 2017-12-17 NOTE — Progress Notes (Signed)
  Speech Language Pathology Treatment: Nada Boozer Speaking valve  Patient Details Name: [name] MRN: [id number] DOB: 1960-12-08 Today's Date: 12/17/2017 Time: 0814-4818 SLP Time Calculation (min) (ACUTE ONLY): 23 min  Assessment / Plan / Recommendation Clinical Impression  Patient tolerates PMV today with for periods of 30 seconds to 1 minute without signs of respiratory distress and vitals remained stable. His wife Amy is present and pt does appear more interactive today. Pt able to clear his throat several times, however does not mobilize secretions. SLP attempted phonation, automatic speech tasks with pt. He does appear to make an effort to repeat "one" with lips moving together slightly but without phonation. Passive phonation noted with yawning, followed by valve displacement. Slight back pressure noted with removal, suggestive of air trapping. Education provided to pt's wife re: recommendations to wear valve only with SLP at this time. Will continue to follow for PMV trials and continued education for pt/family re: use of valve and precautions.     HPI HPI: 57 y.o. male who presented with difficulty speaking. He does have significant past medical history of CVAs with residual weakness. Head CT with acute left corona radiata/basal ganglia infarct. His hospital stay was complicated by respiratory failure which required invasive mechanical ventilation. He developed ventilator dependent respiratory failure,with failure to wean, tracheostomy was performed January 23rd, and PEG tube was placed on the 24th. He was successfully liberated to trach collar, and then transferred to the medical floor.      SLP Plan  Continue with current plan of care       Recommendations         Patient may use Passy-Muir Speech Valve: with SLP only PMSV Supervision: Full MD: Please consider changing trach tube to : Smaller size         Oral Care Recommendations: Oral care QID Follow up  Recommendations: Skilled Nursing facility SLP Visit Diagnosis: Aphonia (R49.1) Plan: Continue with current plan of care       [place], [place], [place] Speech-Language Pathologist [place] 12/17/2017, 5:47 PM

## 2017-12-18 LAB — GLUCOSE, CAPILLARY
GLUCOSE-CAPILLARY: 119 mg/dL — AB (ref 65–99)
GLUCOSE-CAPILLARY: 150 mg/dL — AB (ref 65–99)
GLUCOSE-CAPILLARY: 110 mg/dL — AB (ref 65–99)
GLUCOSE-CAPILLARY: 123 mg/dL — AB (ref 65–99)
Glucose-Capillary: 115 mg/dL — ABNORMAL HIGH (ref 65–99)
Glucose-Capillary: 129 mg/dL — ABNORMAL HIGH (ref 65–99)

## 2017-12-18 LAB — BASIC METABOLIC PANEL
ANION GAP: 12 (ref 5–15)
Anion gap: 9 (ref 5–15)
BUN: 31 mg/dL — ABNORMAL HIGH (ref 6–20)
BUN: 30 mg/dL — ABNORMAL HIGH (ref 6–20)
CALCIUM: 8.9 mg/dL (ref 8.9–10.3)
CHLORIDE: 110 mmol/L (ref 101–111)
CHLORIDE: 114 mmol/L — AB (ref 101–111)
CO2: 27 mmol/L (ref 22–32)
CO2: 27 mmol/L (ref 22–32)
CREATININE: 1.48 mg/dL — AB (ref 0.61–1.24)
Calcium: 9.2 mg/dL (ref 8.9–10.3)
Creatinine, Ser: 1.49 mg/dL — ABNORMAL HIGH (ref 0.61–1.24)
GFR calc non Af Amer: 51 mL/min — ABNORMAL LOW (ref >60)
GFR, EST AFRICAN AMERICAN: 59 mL/min — AB (ref >60)
GFR, EST AFRICAN AMERICAN: 60 mL/min — AB (ref >60)
GFR, EST NON AFRICAN AMERICAN: 52 mL/min — AB (ref >60)
Glucose, Bld: 119 mg/dL — ABNORMAL HIGH (ref 65–99)
Glucose, Bld: 142 mg/dL — ABNORMAL HIGH (ref 65–99)
POTASSIUM: 4.1 mmol/L (ref 3.5–5.1)
Potassium: 4 mmol/L (ref 3.5–5.1)
SODIUM: 149 mmol/L — AB (ref 135–145)
SODIUM: 150 mmol/L — AB (ref 135–145)

## 2017-12-18 MED ORDER — DEXTROSE 5 % IV SOLN
INTRAVENOUS | Status: DC
  Administered 2017-12-18 – 2017-12-21 (×4): via INTRAVENOUS

## 2017-12-18 MED ORDER — FREE WATER
400.0000 mL | Freq: Four times a day (QID) | Status: DC
  Administered 2017-12-18 – 2017-12-22 (×17): 400 mL

## 2017-12-18 MED ORDER — FREE WATER: 350.0000 mL | Freq: Four times a day (QID) | Status: DC

## 2017-12-18 NOTE — Progress Notes (Signed)
PROGRESS NOTE    [name]  SWN:[id number] DOB: September 01, 1961 DOA: 11/25/2017 PCP: Tawny Asal, MD    Brief Narrative:  57 year old male who presented with difficulty speaking. He does have significant past medical history for hypertension, dyslipidemia, also substance abuse and history of CVAs with residual weakness. He had a sudden episode of unable to speak, it was persistent and recurrent. On his initial physical examination blood pressure 163/125, heart rate 93, respiratory 16, temperature 98.5, oxygen saturation 98%. Moist mucous membranes, lungs clear to auscultation bilaterally, no wheezing rales or rhonchi, heart S1-S2 present rhythmic, rales, rubs or murmurs, the abdomen was soft nontender, no lower extremity edema. Patient had dysarthria and expressive aphasia. No other focal deficit. Sodium 142, potassium 3.3, gravida 2, bicarbonate 23, glucose 119, BUN 9, creatinine 1.3, white count 5.2, hemoglobin 13.9, hematocrit 41.8, platelets 249. Urinalysis negative for infection. Head CT with acute left corona radiata/basal ganglia infarct.   Patient was admitted with working diagnosis of acute CVA.  His hospital stay was complicated by respiratory failure which required invasive mechanical ventilation. He developed ventilator dependent respiratory failure,with failure to wean, tracheostomy was performed generating 23rd, and PEG tube was placed in a 24. He was successfully liberated to trach collar, and then transferred to the medical floor.   Assessment & Plan:   Principal Problem:   Stroke [facility]) Active Problems:   Essential hypertension, benign   History of completed stroke   Microcytic anemia   Aphasia   Dysarthria   Oropharyngeal dysphagia   Bulbar palsy (HCC)   Aspiration pneumonia (HCC)   Endotracheally intubated   Acute kidney injury ([place])   Atelectasis, bilateral   Encounter for nasogastric (NG) tube placement   Postextubation stridor   Acute respiratory failure with  hypoxia (HCC)   Aspiration into airway   Hypoxemia   Tracheostomy status ([place])  1. Fever. Reactive/ central.Patient responding well to scheduled tylenol, will continue to monitor temperature curve, no signs of systemic infection. No current indication for antibiotic therapy.   2. Acute CVA/ left subcortical.Severe and persistent neurologic deficit, only able to move his left hand, the upper extremity on the right continue to be contracted. Medical management with asa and statin. Will continue physical therapy and speech therapy. Pending placement at SNF.  3. Respiratory failure, acute hypoxic.managing well secretions per trach, currently with a cuff less #6 Trach.  Continue speech therapy with PMV.  4. Aspiration pneumonia. Will keep head elevated at all times to prevent aspiration. Continue chest pt and as needed bronchodilator therapy.   5. Hypertension.Tolerating well amlodipine, carvedilol, and lisinoprilfor blood pressure control.   6. Acute kidney injuryon CKD stage 2 and worsening hyponatremia.Suspected increase insensible losses through respiratory tract, will continue free water flushes, increase to 400 ml q 6 hours, and will add D5W to 50 ml per hour IV, check chemistry this pm, in preparation for discharge in am.  7.T2DM.Continue close monitoring of capillary glucose with sliding scale, capillary glucose remain well controlled, 126, 115, 119, 150, 110.  8. Lower lip edema and ulceration.Apparently patient has been biting lower lip, continue close observation. Ulceration likely from ET tube.   DVT prophylaxis:enoxaparin Code Status:full Family Communication:I talked yesterday with patient's wife and all questions were addressed.  Disposition Plan:SNF   Consultants:    Procedures:    Antimicrobials:    Subjective: Patient tolerating tube feedings well and able to manage secretions through trach. Still not able to communicate,  working with physical and speech therapy.  Objective: Vitals:   12/17/17 2348 12/18/17 0351 12/18/17 0500 12/18/17 0537  BP:    (!) 160/101  Pulse:    (!) 117  Resp:    19  Temp:    98.8 F (37.1 C)  TempSrc:      SpO2: 98% 97%  96%  Weight:   79.4 kg (175 lb 0.7 oz)   Height:        Intake/Output Summary (Last 24 hours) at 12/18/2017 1106 Last data filed at 12/18/2017 0952 Gross per 24 hour  Intake 1140.67 ml  Output 1600 ml  Net -459.33 ml   Filed Weights   12/16/17 0454 12/17/17 0418 12/18/17 0500  Weight: 77.8 kg (171 lb 8.3 oz) 79.6 kg (175 lb 7.8 oz) 79.4 kg (175 lb 0.7 oz)    Examination:   General: Not in pain or dyspnea, deconditioned Neurology: Awake. Has left upper extremity paresis and spasticity, paresis of both legs, able to move right hand fingers to commands.   E ENT: mild pallor, no icterus, oral mucosa moist, trach in place Cardiovascular: No JVD. S1-S2 present, rhythmic, no gallops, rubs, or murmurs. No lower extremity edema. Pulmonary: vesicular breath sounds bilaterally, adequate air movement, no wheezing, rhonchi or rales.  Gastrointestinal. Abdomen flat, no organomegaly, non tender, no rebound or guarding. PEG tube in place.  Skin. No rashes Musculoskeletal: no joint deformities     Data Reviewed: I have personally reviewed following labs and imaging studies  CBC: Recent Labs  Lab 12/13/17 0402 12/14/17 0258  WBC 7.9 8.9  NEUTROABS  --  6.4  HGB 10.6* 10.7*  HCT 32.0* 34.1*  MCV 70.0* 70.9*  PLT 379 063*   Basic Metabolic Panel: Recent Labs  Lab 12/12/17 0648 12/14/17 0258 12/16/17 0440 12/18/17 0416  NA 144 147* 148* 149*  K 3.6 3.7 3.7 4.1  CL 107 109 111 110  CO2 26 26 28 27   GLUCOSE 116* 140* 146* 119*  BUN 22* 30* 35* 31*  CREATININE 1.28* 1.43* 1.63* 1.49*  CALCIUM 8.6* 9.1 9.0 9.2   GFR: Estimated Creatinine Clearance: 56.6 mL/min (A) (by C-G formula based on SCr of 1.49 mg/dL (H)). Liver Function Tests: No results  for input(s): AST, ALT, ALKPHOS, BILITOT, PROT, ALBUMIN in the last 168 hours. No results for input(s): LIPASE, AMYLASE in the last 168 hours. No results for input(s): AMMONIA in the last 168 hours. Coagulation Profile: No results for input(s): INR, PROTIME in the last 168 hours. Cardiac Enzymes: No results for input(s): CKTOTAL, CKMB, CKMBINDEX, TROPONINI in the last 168 hours. BNP (last 3 results) No results for input(s): PROBNP in the last 8760 hours. HbA1C: No results for input(s): HGBA1C in the last 72 hours. CBG: Recent Labs  Lab 12/17/17 1602 12/17/17 1953 12/18/17 0015 12/18/17 0413 12/18/17 0743  GLUCAP 110* 126* 115* 119* 150*   Lipid Profile: No results for input(s): CHOL, HDL, LDLCALC, TRIG, CHOLHDL, LDLDIRECT in the last 72 hours. Thyroid Function Tests: No results for input(s): TSH, T4TOTAL, FREET4, T3FREE, THYROIDAB in the last 72 hours. Anemia Panel: No results for input(s): VITAMINB12, FOLATE, FERRITIN, TIBC, IRON, RETICCTPCT in the last 72 hours.    Radiology Studies: I have reviewed all of the imaging during this hospital visit personally     Scheduled Meds: . acetaminophen  500 mg Oral TID  . amLODipine  10 mg Per Tube q morning - 10a  . aspirin  325 mg Per Tube Daily  . atorvastatin  80 mg Per Tube q1800  .  baclofen  5 mg Per Tube BID  . carvedilol  3.125 mg Per Tube BID WC  . chlorhexidine  15 mL Mouth Rinse BID  . docusate  100 mg Per Tube BID  . feeding supplement (PRO-STAT SUGAR FREE 64)  30 mL Per Tube Daily  . free water  300 mL Per Tube QID  . heparin injection (subcutaneous)  5,000 Units Subcutaneous Q8H  . heparin lock flush  250 Units Intracatheter Daily  . insulin aspart  0-9 Units Subcutaneous Q4H  . lisinopril  20 mg Per Tube BID  . mouth rinse  15 mL Mouth Rinse q12n4p  . sennosides  5 mL Per Tube BID  . sodium chloride flush  10-40 mL Intracatheter Q12H  . zolpidem  5 mg Oral QHS   Continuous Infusions: . feeding supplement  (JEVITY 1.2 CAL) 1,000 mL (12/18/17 0451)     LOS: 22 days        Carel Carrier Gerome Apley, MD [facility] Pager [phone]

## 2017-12-18 NOTE — Progress Notes (Signed)
Pt family chooses [facility]- facility initiating [facility] Pitch, [place] Social Worker [phone]

## 2017-12-18 NOTE — Progress Notes (Signed)
Physical Therapy Treatment Patient Details Name: [name] MRN: [id number] DOB: February 11, 1961 Today's Date: 12/18/2017    History of Present Illness 57 y.o.malewith hx of previous strokes, polysubstance abuse, HLD, HTN, right basal ganglia hemorrhage in 1999, and left basal ganglia hemorrhage 2005, presenting with episodicaphasia vs. dysarthria.Hedid not receive IV t-PA due to initial improvement in deficits and late presentation. 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 Patient developed severe dysphagia and was intubated on 1/13, trach 1/23, PEG 1/24    PT Comments    Pt remains to require maxA for all mobility. Pt with noted ulcer on outside and inside of bottom lips. Pt did initiate some R UE movement this date and followed 1 step commands 75% of time. Pt tolerated sitting EOB x 10 min this date but demo'd minimal active movement in R LE and no L LE movement. Con't to recommend SNF upon d/c.    Follow Up Recommendations  Supervision/Assistance - 24 hour;SNF     Equipment Recommendations  Wheelchair (measurements PT);Wheelchair cushion (measurements PT)    Recommendations for Other Services Rehab consult     Precautions / Restrictions Precautions Precautions: Fall Precaution Comments: trach, peg, off vent (12/11/17) Restrictions Weight Bearing Restrictions: No    Mobility  Bed Mobility Overal bed mobility: Needs Assistance Bed Mobility: Supine to Sit;Sit to Supine Rolling: Max assist   Supine to sit: Max assist;+2 for safety/equipment Sit to supine: Max assist;+2 for safety/equipment   General bed mobility comments: with v/c's pt did try to pull up with R UE however unable to continue effort s/p 2 sec  Transfers                    Ambulation/Gait                 Stairs            Wheelchair Mobility    Modified Rankin (Stroke Patients Only) Modified Rankin  (Stroke Patients Only) Pre-Morbid Rankin Score: Moderate disability Modified Rankin: Severe disability     Balance Overall balance assessment: Needs assistance Sitting-balance support: Feet supported;Single extremity supported Sitting balance-Leahy Scale: Poor Sitting balance - Comments: pt did pull self with R UE grabing PTs hand to midline x2 but unalbe to hold >5 sec Postural control: Right lateral lean;Posterior lean                                  Cognition Arousal/Alertness: Awake/alert Behavior During Therapy: Flat affect Overall Cognitive Status: Difficult to assess Area of Impairment: Attention;Following commands;Problem solving                   Current Attention Level: Focused   Following Commands: Follows one step commands inconsistently     Problem Solving: Slow processing;Decreased initiation;Difficulty sequencing;Requires verbal cues;Requires tactile cues        Exercises Other Exercises Other Exercises: PROM to BUE in multiple planes    General Comments        Pertinent Vitals/Pain Pain Assessment: Faces Faces Pain Scale: No hurt    Home Living                      Prior Function            PT Goals (current goals can now be found in the care plan section) Progress towards PT goals: Progressing  toward goals    Frequency    Min 3X/week      PT Plan Current plan remains appropriate    Co-evaluation              AM-PAC PT "6 Clicks" Daily Activity  Outcome Measure  Difficulty turning over in bed (including adjusting bedclothes, sheets and blankets)?: Unable Difficulty moving from lying on back to sitting on the side of the bed? : Unable Difficulty sitting down on and standing up from a chair with arms (e.g., wheelchair, bedside commode, etc,.)?: Unable Help needed moving to and from a bed to chair (including a wheelchair)?: Total Help needed walking in hospital room?: Total Help needed climbing 3-5  steps with a railing? : Total 6 Click Score: 6    End of Session Equipment Utilized During Treatment: Oxygen Activity Tolerance: Patient tolerated treatment well Patient left: in bed;with bed alarm set Nurse Communication: Mobility status;Need for lift equipment PT Visit Diagnosis: Other symptoms and signs involving the nervous system (R29.898);Other abnormalities of gait and mobility (R26.89);Muscle weakness (generalized) (M62.81)     Time: 1119-1140 PT Time Calculation (min) (ACUTE ONLY): 21 min  Charges:  $Neuromuscular Re-education: 8-22 mins                    G Codes:       Kittie Plater, PT, DPT Pager #: [phone] Office #: [phone]    [place] 12/18/2017, 1:55 PM

## 2017-12-18 NOTE — Progress Notes (Signed)
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  He subsequently had difficulty managing his secretions and required intubation on the day of admission.  CT scan showed stenosis of M1 and M2 segments on the left and there was infarction in the coronal radiata.  He was extubated on 1/19 but developed stridor and required reintubation.       Percutaneous tracheostomy was performed 1/23 and PEG tube was placed on 1/24.  He was placed on a trach collar yesterday afternoon and this has been well-tolerated overnight.  He has continuous eye opening but is not at all interactive.     SUBJECTIVE:  No acute events.  Tolerating PMV for brief periods.  VITAL SIGNS: BP (!) 160/101 (BP Location: Left Arm)   Pulse (!) 102   Temp 98.8 F (37.1 C)   Resp 20   Ht 5\' 7"  (1.702 m)   Wt 175 lb 0.7 oz (79.4 kg)   SpO2 98%   BMI 27.42 kg/m   HEMODYNAMICS:    VENTILATOR SETTINGS: FiO2 (%):  [28 %] 28 %  INTAKE / OUTPUT: I/O last 3 completed shifts: In: 2040.7 [I.V.:120; NG/GT:1920.7] Out: 1900 [Urine:1900]  PHYSICAL EXAMINATION: Gen:      No acute distress HEENT:  EOMI, sclera anicteric Neck:     No masses; no thyromegaly Lungs:    Clear to auscultation bilaterally; normal respiratory effort CV:         Regular rate and rhythm; no murmurs Abd:      + bowel sounds; soft, non-tender; no palpable masses, no distension Ext:    No edema; adequate peripheral perfusion Skin:      Warm and dry; no rash Neuro: Awake, tracks with eyes.  Does not follow commands.  LABS:  BMET Recent Labs  Lab 12/14/17 0258 12/16/17 0440 12/18/17 0416  NA 147* 148* 149*  K 3.7 3.7 4.1  CL 109  111 110  CO2 26 28 27   BUN 30* 35* 31*  CREATININE 1.43* 1.63* 1.49*  GLUCOSE 140* 146* 119*    Electrolytes Recent Labs  Lab 12/14/17 0258 12/16/17 0440 12/18/17 0416  CALCIUM 9.1 9.0 9.2    CBC Recent Labs  Lab 12/13/17 0402 12/14/17 0258  WBC 7.9 8.9  HGB 10.6* 10.7*  HCT 32.0* 34.1*  PLT 379 407*    Coag's No results for input(s): APTT, INR in the last 168 hours.  Sepsis Markers No results for input(s): LATICACIDVEN, PROCALCITON, O2SATVEN in the last 168 hours.  ABG No results for input(s): PHART, PCO2ART, PO2ART in the last 168 hours.  Liver Enzymes No results for input(s): AST, ALT, ALKPHOS, BILITOT, ALBUMIN in the last 168 hours.  Cardiac Enzymes No results for input(s): TROPONINI, PROBNP in the last 168 hours.  Glucose Recent Labs  Lab 12/17/17 1602 12/17/17 1953 12/18/17 0015 12/18/17 0413 12/18/17 0743 12/18/17 1150  GLUCAP 110* 126* 115* 119* 150* 110*   Chest x-ray 12/12/17-lungs are clear, tracheostomy tube in good position.  I have reviewed the images personally.  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  Difficulty maintaining his airway and failed extubation, percutaneous tracheostomy was performed on 1/23.  PEG tube was placed  on 1/24.    ASSESSMENT / PLAN: Chronic respiratory failure: Continue PMV trials as tolerated No decannulation as he has poor mental status.  Trach changed to cuff less #6 on 2/1. Supplemental oxygen as needed.  Marshell Garfinkel MD [facility] Pager [phone] If no answer or after 3pm call: [phone] 12/18/2017, 2:26 PM

## 2017-12-19 LAB — GLUCOSE, CAPILLARY
GLUCOSE-CAPILLARY: 121 mg/dL — AB (ref 65–99)
GLUCOSE-CAPILLARY: 122 mg/dL — AB (ref 65–99)
GLUCOSE-CAPILLARY: 127 mg/dL — AB (ref 65–99)
GLUCOSE-CAPILLARY: 96 mg/dL (ref 65–99)
Glucose-Capillary: 117 mg/dL — ABNORMAL HIGH (ref 65–99)
Glucose-Capillary: 128 mg/dL — ABNORMAL HIGH (ref 65–99)

## 2017-12-19 LAB — BASIC METABOLIC PANEL
Anion gap: 13 (ref 5–15)
BUN: 30 mg/dL — AB (ref 6–20)
CHLORIDE: 110 mmol/L (ref 101–111)
CO2: 25 mmol/L (ref 22–32)
Calcium: 8.9 mg/dL (ref 8.9–10.3)
Creatinine, Ser: 1.45 mg/dL — ABNORMAL HIGH (ref 0.61–1.24)
GFR calc Af Amer: >60 mL/min (ref >60)
GFR calc non Af Amer: 53 mL/min — ABNORMAL LOW (ref >60)
GLUCOSE: 141 mg/dL — AB (ref 65–99)
POTASSIUM: 4.2 mmol/L (ref 3.5–5.1)
Sodium: 148 mmol/L — ABNORMAL HIGH (ref 135–145)

## 2017-12-19 MED ORDER — PRO-STAT SUGAR FREE PO LIQD: 30.0000 mL | Freq: Every day | ORAL | 0 refills | Status: AC

## 2017-12-19 MED ORDER — IBUPROFEN 100 MG/5ML PO SUSP: 400.0000 mg | Freq: Four times a day (QID) | ORAL | 0 refills | Status: AC | PRN

## 2017-12-19 MED ORDER — INSULIN ASPART 100 UNIT/ML ~~LOC~~ SOLN: 0.0000 [IU] | SUBCUTANEOUS | 11 refills | Status: AC

## 2017-12-19 MED ORDER — DOCUSATE SODIUM 50 MG/5ML PO LIQD: 100.0000 mg | Freq: Two times a day (BID) | ORAL | 0 refills | Status: AC

## 2017-12-19 MED ORDER — ACETAMINOPHEN 325 MG PO TABS: 650.0000 mg | ORAL_TABLET | ORAL | 0 refills | Status: AC | PRN

## 2017-12-19 MED ORDER — BACLOFEN 5 MG PO TABS: 5.0000 mg | ORAL_TABLET | Freq: Two times a day (BID) | ORAL | 0 refills | Status: AC

## 2017-12-19 MED ORDER — ALBUTEROL SULFATE (2.5 MG/3ML) 0.083% IN NEBU: 2.5000 mg | INHALATION_SOLUTION | RESPIRATORY_TRACT | 12 refills | Status: AC | PRN

## 2017-12-19 MED ORDER — JEVITY 1.2 CAL PO LIQD: 1000.0000 mL | ORAL | 0 refills | Status: AC

## 2017-12-19 MED ORDER — ATORVASTATIN CALCIUM 80 MG PO TABS: 80.0000 mg | ORAL_TABLET | Freq: Every day | ORAL | 0 refills | Status: AC

## 2017-12-19 MED ORDER — CARVEDILOL 3.125 MG PO TABS: 3.1250 mg | ORAL_TABLET | Freq: Two times a day (BID) | ORAL | 0 refills | Status: AC

## 2017-12-19 MED ORDER — FREE WATER: 300.0000 mL | Freq: Four times a day (QID) | 0 refills | Status: AC

## 2017-12-19 MED ORDER — CLOPIDOGREL BISULFATE 75 MG PO TABS: 75.0000 mg | ORAL_TABLET | Freq: Every day | ORAL | 11 refills | Status: AC

## 2017-12-19 NOTE — Progress Notes (Signed)
RN working w/ pt, pt being given meds via PEG tub.  CPT held for now.

## 2017-12-19 NOTE — Progress Notes (Signed)
CSW received call from [facility] that they have not received auth yet and cannot accept an log.   Percell Locus Xavyer Steenson LCSW [phone]

## 2017-12-19 NOTE — Discharge Summary (Addendum)
Physician Discharge Summary  [name] HQI:[id number] DOB: 05-Jan-1961 DOA: 11/25/2017  PCP: Tawny Asal, MD  Admit date: 11/25/2017 Discharge date: 12/21/2017  Admitted From: Home Disposition:  SNF  Recommendations for Outpatient Follow-up and new medication changes:  1. Follow up with PCP in 1- week. 2. Patient has been placed on aspirin and clopidogrel 3. High potency, high dose statin 4. Continue physical and speech therapy   Home Health: no   Equipment/Devices: no   Discharge Condition: stable CODE STATUS: full  Diet recommendation: Tube feedings.   Brief/Interim Summary: 56 year old male who presented with difficulty speaking. He does have a significant past medical history for hypertension, dyslipidemia, also substance abuse and history of CVAs with residual weakness. He had a sudden episode of unable to speak, it was persistent and recurrent. On his initial physical examination blood pressure 163/125, heart rate 93, respiratory rate 16, temperature 98.5, oxygen saturation 98%. Moist mucous membranes, lungs clear to auscultation bilaterally, no wheezing rales or rhonchi, heart S1-S2 present rhythmic, no gallops, rubs or murmurs, the abdomen was soft nontender, no lower extremity edema. Patient had dysarthria and expressive aphasia. Sodium 142, potassium 3.3, bicarbonate 23, glucose 119, BUN 9, creatinine 1.3, white count 5.2, hemoglobin 13.9, hematocrit 41.8, platelets 249. Urinalysis negative for infection. Head CT with acute left corona radiata/basal ganglia infarct.   Patient was admitted with working diagnosis of acute CVA.  His hospital stay was complicated by respiratory failure which required invasive mechanical ventilation. He developed ventilator dependent respiratory failure,with failure to wean, tracheostomy was performed on January 23rd, and PEG tube was placed in January 24. He was successfully liberated to trach collar, and then transferred to the medical  floor.  ADDENDUM: Today, pt remained stable, no new changes to the plan of care/discharge planning. Pt stable to d/c to SNF for further management. Updated pt wife Mrs Glendell Fouse about the discharge plan, wife in agreement. Pt to be discharged today 12/21/17     1.  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  Patient with severe and persistent neurologic deficit, only able to move his distal right upper extremity.  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  Further workup with head MRI showed left basal ganglia and corona radiata infarcts, CT angiography showed right M1, M2, and left M2 stenosis.  His echocardiogram had normal LV systolic function 13-24%.  Patient has been placed on dual antiplatelet therapy with aspirin and clopidogrel.  He was seen by physical therapy and speech therapy, he will be transferred to skilled nurse facility to continue rehabilitation.   2.  Aspiration pneumonia with acute hypoxic respiratory failure status post tracheostomy.  Shiley cuff less #6.  Patient went into respiratory distress, had difficulty managing his secretions and managing his airway, then he underwent intubation plus invasive mechanical ventilation.  It was suspected that he developed an aspiration pneumonia. He was placed on broad-spectrum antibiotic therapy.  He was extubated January 19, but developed stridor and respiratory distress requiring reintubation.  He failed further spontaneous breathing trials, he required tracheostomy January 23, and he was successfully transitioned to a trach collar.  Over the last 72 hours he has been able to manage his secretions well, has a strong cough.  Continue speech therapy.  Patient developed a lower lip ulceration, consequence of prolonged invasive mechanical ventilation, continue local wound care.   3.  Central fevers.  Patient had fever spikes after completing antibiotic therapy, extensive workup  was done, no deep infection identified.  No further antibiotic therapy was prescribed.  Continue as needed antipyretics.  4.  Acute kidney injury chronic disease stage II, hypernatremia.  Patient received IV fluids with improvement of kidney function, his discharge creatinine 1.48 with a potassium of 4.0.  Patient is receiving free water flushes per PEG tube to control his hypernatremia. Recommend a close follow-up on electrolytes.   5.  Type 2 diabetes mellitus.  Patient was placed on insulin sliding scale glucose coverage monitor, capillary glucose remained well controlled.  Tolerating well tube feeds.   6.  Hypertension.  Continue blood pressure control with amlodipine, carvedilol and lisinopril.  Discharge Diagnoses:  Principal Problem:   Stroke [facility]) Active Problems:   Essential hypertension, benign   History of completed stroke   Microcytic anemia   Aphasia   Dysarthria   Oropharyngeal dysphagia   Bulbar palsy (HCC)   Aspiration pneumonia (HCC)   Endotracheally intubated   Acute kidney injury (HCC)   Atelectasis, bilateral   Encounter for nasogastric (NG) tube placement   Postextubation stridor   Acute respiratory failure with hypoxia (HCC)   Aspiration into airway   Hypoxemia   Tracheostomy status ([place])    Discharge Instructions   Allergies as of 12/19/2017   No Known Allergies     Medication List    STOP taking these medications   hydrochlorothiazide 25 MG tablet Commonly known as:  HYDRODIURIL     TAKE these medications   acetaminophen 325 MG tablet Commonly known as:  TYLENOL Take 2 tablets (650 mg total) by mouth every 4 (four) hours as needed for mild pain (100.4 F).   albuterol (2.5 MG/3ML) 0.083% nebulizer solution Commonly known as:  PROVENTIL Take 3 mLs (2.5 mg total) by nebulization every 2 (two) hours as needed for wheezing.   amLODipine 10 MG tablet Commonly known as:  NORVASC Take 1 tablet (10 mg total) by mouth every morning.   aspirin 81  MG tablet Take 1 tablet (81 mg total) by mouth daily.   atorvastatin 80 MG tablet Commonly known as:  LIPITOR Place 1 tablet (80 mg total) into feeding tube daily at 6 PM. What changed:    medication strength  how much to take  how to take this  when to take this   Baclofen 5 MG Tabs Place 5 mg into feeding tube 2 (two) times daily.   carvedilol 3.125 MG tablet Commonly known as:  COREG Place 1 tablet (3.125 mg total) into feeding tube 2 (two) times daily with a meal.   clopidogrel 75 MG tablet Commonly known as:  PLAVIX Place 1 tablet (75 mg total) into feeding tube daily.   docusate 50 MG/5ML liquid Commonly known as:  COLACE Place 10 mLs (100 mg total) into feeding tube 2 (two) times daily.   feeding supplement (JEVITY 1.2 CAL) Liqd Place 1,000 mLs into feeding tube continuous.   feeding supplement (PRO-STAT SUGAR FREE 64) Liqd Place 30 mLs into feeding tube daily. Start taking on:  12/20/2017   free water Soln Place 300 mLs into feeding tube 4 (four) times daily.   HM MULTIVITAMIN ADULT GUMMY PO Take 1 tablet by mouth every morning.   ibuprofen 100 MG/5ML suspension Commonly known as:  ADVIL,MOTRIN Place 20 mLs (400 mg total) into feeding tube every 6 (six) hours as needed for fever.   insulin aspart 100 UNIT/ML injection Commonly known as:  novoLOG Inject 0-9 Units into the skin every 4 (four) hours. For glucose  150-200 use 2 units, for 201-250 use 4 units, for 251 to 300 use 6 units, for 301 to 350 use 8 units, for 351 or greater use 10 units.   lisinopril 20 MG tablet Commonly known as:  PRINIVIL,ZESTRIL Take 1 tablet (20 mg total) by mouth daily.      Contact information for after-discharge care    [facility] .   Service:  Skilled Nursing Contact information: [address] [place] [phone]             No Known  Allergies  Consultations:  Neurology   Procedures/Studies: Ct Angio Head W Or Wo Contrast  Result Date: 11/26/2017 CLINICAL DATA:  Follow-up code stroke. History of stroke, substance abuse, hypertension, hyperlipidemia. EXAM: CT ANGIOGRAPHY HEAD AND NECK CT PERFUSION BRAIN TECHNIQUE: Multidetector CT imaging of the head and neck was performed using the standard protocol during bolus administration of intravenous contrast. Multiplanar CT image reconstructions and MIPs were obtained to evaluate the vascular anatomy. Carotid stenosis measurements (when applicable) are obtained utilizing NASCET criteria, using the distal internal carotid diameter as the denominator. Multiphase CT imaging of the brain was performed following IV bolus contrast injection. Subsequent parametric perfusion maps were calculated using RAPID software. CONTRAST:  142mL ISOVUE-370 IOPAMIDOL (ISOVUE-370) INJECTION 76% COMPARISON:  CT HEAD November 25, 2017 at 11:11 p.m. MRA head and neck January 07, 2007 FINDINGS: CTA NECK FINDINGS: AORTIC ARCH: 3.6 cm ascending aorta, normal branch pattern. Mild intimal thickening aortic arch. The origins of the innominate, left Common carotid artery and subclavian artery are widely patent. Mild stenosis mid LEFT subclavian artery. RIGHT CAROTID SYSTEM: Common carotid artery is widely patent, coursing in a straight line fashion. Normal appearance of the carotid bifurcation without hemodynamically significant stenosis by NASCET criteria. Tortuous course associated with hypertension. LEFT CAROTID SYSTEM: Common carotid artery is widely patent, coursing in a straight line fashion. Normal appearance of the carotid bifurcation without hemodynamically significant stenosis by NASCET criteria. Tortuous course associated with hypertension, widely patent; mild luminal irregularity most compatible with atherosclerosis. VERTEBRAL ARTERIES:New, age indeterminate LEFT V1 non flow limiting dissection flap without  pseudo aneurysm. Mild LEFT vertebral artery luminal irregularity. Mild calcific atherosclerosis RIGHT vertebral artery origin. Bilateral vertebral arteries are patent. SKELETON: No acute osseous process though bone windows have not been submitted. Poor dentition. OTHER NECK: Soft tissues of the neck are nonacute though, not tailored for evaluation. UPPER CHEST: Included lung apices are clear. No superior mediastinal lymphadenopathy. CTA HEAD FINDINGS: ANTERIOR CIRCULATION: Patent cervical internal carotid arteries, petrous, cavernous and supra clinoid internal carotid arteries. Calcific atherosclerosis resulting in moderate to severe stenosis RIGHT para clinoid internal carotid artery. Bilateral 2 mm paraophthalmic outpouchings (axial 103/352). Patent anterior communicating artery. Patent anterior and middle cerebral arteries. Moderate tandem stenoses bilateral anterior cerebral arteries. Moderate stenosis RIGHT M1 origin. Moderate to severe stenosis distal RIGHT M2. Focally occluded versus severe stenosis proximal RIGHT M3 segment. Severe stenosis LEFT M2 inferior division origin. No large vessel occlusion, significant stenosis, contrast extravasation or aneurysm. POSTERIOR CIRCULATION: Patent vertebral arteries, vertebrobasilar junction and basilar artery, as well as main branch vessels with mild luminal irregularity. Moderate stenosis RIGHT V4 segment with mild poststenotic dilatation. Patent posterior cerebral arteries. Moderate to severe tandem stenoses RIGHT posterior cerebral artery. Moderate tandem stenosis LEFT posterior cerebral artery. No large vessel occlusion, significant stenosis, contrast extravasation or aneurysm. VENOUS SINUSES: Major dural venous sinuses are patent though not tailored for evaluation on this angiographic examination.  ANATOMIC VARIANTS: None. DELAYED PHASE: Not performed. MIP images reviewed. CT Brain Perfusion Findings: CBF (<30%) Volume: 49mL Perfusion (Tmax>6.0s) volume: 103mL  Mismatch Volume: 5mL Infarction Location:Perfusion mismatch bifrontal lobes, likely artifact. Prolonged mean transit time RIGHT basal ganglia and to lesser extent RIGHT MCA territory. IMPRESSION: CTA NECK: 1. No hemodynamically significant stenosis or acute vascular process in the neck. 2. 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 No pseudoaneurysm. 3. **An incidental finding of potential clinical significance has been found. Ectatic 3.6 cm ascending aorta. Recommend annual imaging followup by CTA or MRA. This recommendation follows 2010 ACCF/AHA/AATS/ACR/ASA/SCA/SCAI/SIR/STS/SVM Guidelines for the Diagnosis and Management of Patients with Thoracic Aortic Disease. Circulation.2010; 121: C166-A630** CTA HEAD: 1. No emergent large vessel occlusion. Focally occluded RIGHT M2/M3 segment. 2. Advanced intracranial atherosclerosis with multifocal high-grade stenosis including severe stenosis LEFT M2 segment, moderate to severe stenoses RIGHT middle cerebral artery and LEFT posterior cerebral artery. 3. 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 CT PERFUSION: 1. No acute perfusion defect. 2. Prolonged mean transit time RIGHT MCA consistent with stenoses. Acute findings discussed with and reconfirmed by Dr.ASHISH ARORA on 11/25/2017 at 12:00 am. Electronically Signed   By: Elon Alas M.D.   On: 11/26/2017 00:03   Ct Head Wo Contrast  Result Date: 12/03/2017 CLINICAL DATA:  Follow-up of the lacunar infarcts EXAM: CT HEAD WITHOUT CONTRAST TECHNIQUE: Contiguous axial images were obtained from the base of the skull through the vertex without intravenous contrast. COMPARISON:  Brain MRI 11/27/2017 FINDINGS: Brain: Hypoattenuation in the left basal ganglia and corona radiata corresponds to known recent infarcts. No hemorrhage or significant mass effect. There is encephalomalacia of the right external capsule with hypoattenuation in the bilateral periventricular  white matter, right worse than left. There is ex vacuo dilatation of the right lateral ventricle. Vascular: No hyperdense vessel or unexpected calcification. Skull: Normal visualized skull base, calvarium and extracranial soft tissues. Sinuses/Orbits: No sinus fluid levels or advanced mucosal thickening. No mastoid effusion. Normal orbits. IMPRESSION: Expected evolution of left basal ganglia/corona radiata infarcts without acute hemorrhage or significant mass effect. Electronically Signed   By: Ulyses Jarred M.D.   On: 12/03/2017 05:03   Ct Angio Neck W Or Wo Contrast  Result Date: 11/26/2017 CLINICAL DATA:  Follow-up code stroke. History of stroke, substance abuse, hypertension, hyperlipidemia. EXAM: CT ANGIOGRAPHY HEAD AND NECK CT PERFUSION BRAIN TECHNIQUE: Multidetector CT imaging of the head and neck was performed using the standard protocol during bolus administration of intravenous contrast. Multiplanar CT image reconstructions and MIPs were obtained to evaluate the vascular anatomy. Carotid stenosis measurements (when applicable) are obtained utilizing NASCET criteria, using the distal internal carotid diameter as the denominator. Multiphase CT imaging of the brain was performed following IV bolus contrast injection. Subsequent parametric perfusion maps were calculated using RAPID software. CONTRAST:  144mL ISOVUE-370 IOPAMIDOL (ISOVUE-370) INJECTION 76% COMPARISON:  CT HEAD November 25, 2017 at 11:11 p.m. MRA head and neck January 07, 2007 FINDINGS: CTA NECK FINDINGS: AORTIC ARCH: 3.6 cm ascending aorta, normal branch pattern. Mild intimal thickening aortic arch. The origins of the innominate, left Common carotid artery and subclavian artery are widely patent. Mild stenosis mid LEFT subclavian artery. RIGHT CAROTID SYSTEM: Common carotid artery is widely patent, coursing in a straight line fashion. Normal appearance of the carotid bifurcation without hemodynamically significant stenosis by NASCET  criteria. Tortuous course associated with hypertension. LEFT CAROTID SYSTEM: Common carotid artery is widely patent, coursing in a straight line fashion. Normal appearance of the  carotid bifurcation without hemodynamically significant stenosis by NASCET criteria. Tortuous course associated with hypertension, widely patent; mild luminal irregularity most compatible with atherosclerosis. VERTEBRAL ARTERIES:New, age indeterminate LEFT V1 non flow limiting dissection flap without pseudo aneurysm. Mild LEFT vertebral artery luminal irregularity. Mild calcific atherosclerosis RIGHT vertebral artery origin. Bilateral vertebral arteries are patent. SKELETON: No acute osseous process though bone windows have not been submitted. Poor dentition. OTHER NECK: Soft tissues of the neck are nonacute though, not tailored for evaluation. UPPER CHEST: Included lung apices are clear. No superior mediastinal lymphadenopathy. CTA HEAD FINDINGS: ANTERIOR CIRCULATION: Patent cervical internal carotid arteries, petrous, cavernous and supra clinoid internal carotid arteries. Calcific atherosclerosis resulting in moderate to severe stenosis RIGHT para clinoid internal carotid artery. Bilateral 2 mm paraophthalmic outpouchings (axial 103/352). Patent anterior communicating artery. Patent anterior and middle cerebral arteries. Moderate tandem stenoses bilateral anterior cerebral arteries. Moderate stenosis RIGHT M1 origin. Moderate to severe stenosis distal RIGHT M2. Focally occluded versus severe stenosis proximal RIGHT M3 segment. Severe stenosis LEFT M2 inferior division origin. No large vessel occlusion, significant stenosis, contrast extravasation or aneurysm. POSTERIOR CIRCULATION: Patent vertebral arteries, vertebrobasilar junction and basilar artery, as well as main branch vessels with mild luminal irregularity. Moderate stenosis RIGHT V4 segment with mild poststenotic dilatation. Patent posterior cerebral arteries. Moderate to severe  tandem stenoses RIGHT posterior cerebral artery. Moderate tandem stenosis LEFT posterior cerebral artery. No large vessel occlusion, significant stenosis, contrast extravasation or aneurysm. VENOUS SINUSES: Major dural venous sinuses are patent though not tailored for evaluation on this angiographic examination. ANATOMIC VARIANTS: None. DELAYED PHASE: Not performed. MIP images reviewed. CT Brain Perfusion Findings: CBF (<30%) Volume: 63mL Perfusion (Tmax>6.0s) volume: 95mL Mismatch Volume: 51mL Infarction Location:Perfusion mismatch bifrontal lobes, likely artifact. Prolonged mean transit time RIGHT basal ganglia and to lesser extent RIGHT MCA territory. IMPRESSION: CTA NECK: 1. No hemodynamically significant stenosis or acute vascular process in the neck. 2. 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 No pseudoaneurysm. 3. **An incidental finding of potential clinical significance has been found. Ectatic 3.6 cm ascending aorta. Recommend annual imaging followup by CTA or MRA. This recommendation follows 2010 ACCF/AHA/AATS/ACR/ASA/SCA/SCAI/SIR/STS/SVM Guidelines for the Diagnosis and Management of Patients with Thoracic Aortic Disease. Circulation.2010; 121: [id number]** CTA HEAD: 1. No emergent large vessel occlusion. Focally occluded RIGHT M2/M3 segment. 2. Advanced intracranial atherosclerosis with multifocal high-grade stenosis including severe stenosis LEFT M2 segment, moderate to severe stenoses RIGHT middle cerebral artery and LEFT posterior cerebral artery. 3. 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 CT PERFUSION: 1. No acute perfusion defect. 2. Prolonged mean transit time RIGHT MCA consistent with stenoses. Acute findings discussed with and reconfirmed by Dr.ASHISH ARORA on 11/25/2017 at 12:00 am. Electronically Signed   By: Elon Alas M.D.   On: 11/26/2017 00:03   Mr Brain Wo Contrast  Result Date: 11/27/2017 CLINICAL DATA:  57 year old male  status post code stroke presentation. Prior infarcts. Left vertebral artery dissection, intracranial atherosclerosis, and occluded right M2/M3 MCA segment on CTA head and neck 2 days ago. EXAM: MRI HEAD WITHOUT CONTRAST TECHNIQUE: Multiplanar, multiecho pulse sequences of the brain and surrounding structures were obtained without intravenous contrast. COMPARISON:  CTA head and neck and CT brain perfusion 11/25/2017. Brain MRI 06/08/2013 FINDINGS: Brain: Small areas of restricted diffusion in the left corona radiata both anteriorly (series 4, image 32), and more pronounced posteriorly with some extension toward the left deep white matter capsules and lentiform. Associated T2 and FLAIR hyperintensity with no acute hemorrhage or mass  effect. No other restricted diffusion but extensive chronic ischemic changes about the bilateral deep gray matter nuclei with chronic hemosiderin. Occasional other scattered chronic micro hemorrhages in the brain. Chronically advanced T2 and FLAIR hyperintensity in the cerebral white matter (greater in the right hemisphere) and also the pons. Chronic right side Wallerian degeneration is evident in the brainstem (series 7, image 9). The cerebellum appears to remain normal. No discrete cerebral cortical encephalomalacia is identified. Stable mild ex vacuo lateral ventricle enlargement. Stable cerebral volume. No midline shift, mass effect, evidence of mass lesion, extra-axial collection or acute intracranial hemorrhage. Cervicomedullary junction and pituitary are within normal limits. Vascular: Major intracranial vascular flow voids are stable compared to 2,014. Skull and upper cervical spine: Negative visualized cervical spine. Normal bone marrow signal. Sinuses/Orbits: Normal orbits soft tissues. Paranasal sinus mucosal thickening. Mastoid air cells remain clear. Other: Intubated. Fluid in the pharynx. Right side nasoenteric tube in place. Visible internal auditory structures appear  normal. Scalp and face soft tissues appear negative. IMPRESSION: 1. Small acute infarcts scattered in the left corona radiata, some tracking toward the posterior left lentiform. No associated acute hemorrhage or mass effect. 2. Underlying severe chronic ischemic disease in the bilateral deep gray matter nuclei with advanced chronic signal changes in the cerebral white matter, and brainstem Wallerian degeneration. 3. Intubated. Right nasoenteric tube in place. Fluid in the pharynx. Electronically Signed   By: Genevie Ann M.D.   On: 11/27/2017 11:15   Ct Cerebral Perfusion W Contrast  Result Date: 11/26/2017 CLINICAL DATA:  Follow-up code stroke. History of stroke, substance abuse, hypertension, hyperlipidemia. EXAM: CT ANGIOGRAPHY HEAD AND NECK CT PERFUSION BRAIN TECHNIQUE: Multidetector CT imaging of the head and neck was performed using the standard protocol during bolus administration of intravenous contrast. Multiplanar CT image reconstructions and MIPs were obtained to evaluate the vascular anatomy. Carotid stenosis measurements (when applicable) are obtained utilizing NASCET criteria, using the distal internal carotid diameter as the denominator. Multiphase CT imaging of the brain was performed following IV bolus contrast injection. Subsequent parametric perfusion maps were calculated using RAPID software. CONTRAST:  178mL ISOVUE-370 IOPAMIDOL (ISOVUE-370) INJECTION 76% COMPARISON:  CT HEAD November 25, 2017 at 11:11 p.m. MRA head and neck January 07, 2007 FINDINGS: CTA NECK FINDINGS: AORTIC ARCH: 3.6 cm ascending aorta, normal branch pattern. Mild intimal thickening aortic arch. The origins of the innominate, left Common carotid artery and subclavian artery are widely patent. Mild stenosis mid LEFT subclavian artery. RIGHT CAROTID SYSTEM: Common carotid artery is widely patent, coursing in a straight line fashion. Normal appearance of the carotid bifurcation without hemodynamically significant stenosis by  NASCET criteria. Tortuous course associated with hypertension. LEFT CAROTID SYSTEM: Common carotid artery is widely patent, coursing in a straight line fashion. Normal appearance of the carotid bifurcation without hemodynamically significant stenosis by NASCET criteria. Tortuous course associated with hypertension, widely patent; mild luminal irregularity most compatible with atherosclerosis. VERTEBRAL ARTERIES:New, age indeterminate LEFT V1 non flow limiting dissection flap without pseudo aneurysm. Mild LEFT vertebral artery luminal irregularity. Mild calcific atherosclerosis RIGHT vertebral artery origin. Bilateral vertebral arteries are patent. SKELETON: No acute osseous process though bone windows have not been submitted. Poor dentition. OTHER NECK: Soft tissues of the neck are nonacute though, not tailored for evaluation. UPPER CHEST: Included lung apices are clear. No superior mediastinal lymphadenopathy. CTA HEAD FINDINGS: ANTERIOR CIRCULATION: Patent cervical internal carotid arteries, petrous, cavernous and supra clinoid internal carotid arteries. Calcific atherosclerosis resulting in moderate to severe stenosis RIGHT para clinoid internal carotid  artery. Bilateral 2 mm paraophthalmic outpouchings (axial 103/352). Patent anterior communicating artery. Patent anterior and middle cerebral arteries. Moderate tandem stenoses bilateral anterior cerebral arteries. Moderate stenosis RIGHT M1 origin. Moderate to severe stenosis distal RIGHT M2. Focally occluded versus severe stenosis proximal RIGHT M3 segment. Severe stenosis LEFT M2 inferior division origin. No large vessel occlusion, significant stenosis, contrast extravasation or aneurysm. POSTERIOR CIRCULATION: Patent vertebral arteries, vertebrobasilar junction and basilar artery, as well as main branch vessels with mild luminal irregularity. Moderate stenosis RIGHT V4 segment with mild poststenotic dilatation. Patent posterior cerebral arteries. Moderate to  severe tandem stenoses RIGHT posterior cerebral artery. Moderate tandem stenosis LEFT posterior cerebral artery. No large vessel occlusion, significant stenosis, contrast extravasation or aneurysm. VENOUS SINUSES: Major dural venous sinuses are patent though not tailored for evaluation on this angiographic examination. ANATOMIC VARIANTS: None. DELAYED PHASE: Not performed. MIP images reviewed. CT Brain Perfusion Findings: CBF (<30%) Volume: 44mL Perfusion (Tmax>6.0s) volume: 71mL Mismatch Volume: 73mL Infarction Location:Perfusion mismatch bifrontal lobes, likely artifact. Prolonged mean transit time RIGHT basal ganglia and to lesser extent RIGHT MCA territory. IMPRESSION: CTA NECK: 1. No hemodynamically significant stenosis or acute vascular process in the neck. 2. 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 No pseudoaneurysm. 3. **An incidental finding of potential clinical significance has been found. Ectatic 3.6 cm ascending aorta. Recommend annual imaging followup by CTA or MRA. This recommendation follows 2010 ACCF/AHA/AATS/ACR/ASA/SCA/SCAI/SIR/STS/SVM Guidelines for the Diagnosis and Management of Patients with Thoracic Aortic Disease. Circulation.2010; 121: B147-W295** CTA HEAD: 1. No emergent large vessel occlusion. Focally occluded RIGHT M2/M3 segment. 2. Advanced intracranial atherosclerosis with multifocal high-grade stenosis including severe stenosis LEFT M2 segment, moderate to severe stenoses RIGHT middle cerebral artery and LEFT posterior cerebral artery. 3. 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 CT PERFUSION: 1. No acute perfusion defect. 2. Prolonged mean transit time RIGHT MCA consistent with stenoses. Acute findings discussed with and reconfirmed by Dr.ASHISH ARORA on 11/25/2017 at 12:00 am. Electronically Signed   By: Elon Alas M.D.   On: 11/26/2017 00:03   Dg Chest Port 1 View  Result Date: 12/12/2017 CLINICAL DATA:  Fever.  EXAM: PORTABLE CHEST 1 VIEW COMPARISON:  Radiograph of December 10, 2017. FINDINGS: Stable cardiomegaly. Tracheostomy tube is in grossly good position. No pneumothorax or pleural effusion is noted. Right sided PICC line is noted with distal tip in expected position of cavoatrial junction. No acute pulmonary disease is noted. Bony thorax is unremarkable. IMPRESSION: Stable support apparatus. No acute cardiopulmonary abnormality seen. Electronically Signed   By: Marijo Conception, M.D.   On: 12/12/2017 13:25   Dg Chest Port 1 View  Result Date: 12/10/2017 CLINICAL DATA:  Acute respiratory failure with hypoxia. EXAM: PORTABLE CHEST 1 VIEW COMPARISON:  12/06/2017 FINDINGS: Tracheostomy remains in place. Right arm PICC tip in the proximal right atrium. Mild cardiomegaly and aortic atherosclerosis as seen previously. Worsened patchy density at the right lung base consistent with atelectasis or mild pneumonia. Left lung remains clear. IMPRESSION: Development of right base patchy density which could be atelectasis or mild pneumonia. Electronically Signed   By: Nelson Chimes M.D.   On: 12/10/2017 09:17   Dg Chest Port 1 View  Result Date: 12/06/2017 CLINICAL DATA:  Assess tracheostomy tube position. EXAM: PORTABLE CHEST 1 VIEW COMPARISON:  Chest x-ray from same day at 0600. FINDINGS: Interval placement of a tracheostomy tube with the tip in good position, approximately 5.2 cm above the level of the carina. Interval removal of the enteric  tube. Interval placement of a right sided PICC line with the tip at the cavoatrial junction. Stable mild cardiomegaly. Normal pulmonary vascularity. No focal consolidation, pleural effusion, or pneumothorax. No acute osseous abnormality. IMPRESSION: 1. Appropriately positioned tracheostomy tube. 2. Interval placement of a right sided PICC line with the tip at the cavoatrial junction. No pneumothorax. Electronically Signed   By: Titus Dubin M.D.   On: 12/06/2017 16:53   Dg Chest  Port 1 View  Result Date: 12/06/2017 CLINICAL DATA:  Fevers EXAM: PORTABLE CHEST 1 VIEW COMPARISON:  12/03/2017 FINDINGS: Cardiac shadow is enlarged but stable. Endotracheal tube and nasogastric catheter are again noted and stable. The lungs are well aerated bilaterally. No focal infiltrate or sizable effusion is seen. No acute bony abnormality is noted. IMPRESSION: No acute abnormality noted. Electronically Signed   By: Inez Catalina M.D.   On: 12/06/2017 07:58   Dg Chest Port 1 View  Result Date: 12/03/2017 CLINICAL DATA:  57 year old male with history of aspiration. EXAM: PORTABLE CHEST 1 VIEW COMPARISON:  Chest x-ray 12/02/2017. FINDINGS: An endotracheal tube is in place with tip 3.2 cm above the carina. A nasogastric tube is seen extending into the stomach, however, the tip of the nasogastric tube extends below the lower margin of the image. Lung volumes are slightly low. Improving aeration in the left lung base, suggesting resolving areas of atelectasis and/or consolidation. Right lung is clear. No pleural effusions. No evidence of pulmonary edema. Heart size is mildly enlarged. Upper mediastinal contours are unremarkable. IMPRESSION: 1. Support apparatus, as above. 2. Improving aeration in the left lung base, likely to reflect areas of resolving atelectasis and/or consolidation. 3. Mild cardiomegaly. Electronically Signed   By: Vinnie Langton M.D.   On: 12/03/2017 07:32   Dg Chest Port 1 View  Result Date: 12/02/2017 CLINICAL DATA:  57 year old male status post intubation. EXAM: PORTABLE CHEST 1 VIEW COMPARISON:  Chest radiograph dated 11/28/2017 FINDINGS: Endotracheal tube approximately 3.4 cm above the carina. An enteric tube extends into the upper abdomen with tip likely in the distal stomach. There is mild cardiomegaly. Mild perivascular hazy densities may represent atelectatic changes or mild vascular congestion. An area of increased density at the left lung base may represent atelectatic  changes. Pneumonia is not excluded. Clinical correlation is recommended. There is no large pleural effusion. No pneumothorax. No acute osseous pathology. IMPRESSION: 1. Endotracheal tube above the carina and enteric tube likely in the distal stomach. 2. Mild cardiomegaly. 3. Bilateral infrahilar probable atelectatic changes. Left lung base infiltrate is not entirely excluded. Clinical correlation is recommended. Electronically Signed   By: Anner Crete M.D.   On: 12/02/2017 01:14   Dg Chest Port 1 View  Result Date: 11/28/2017 CLINICAL DATA:  Intubation. EXAM: PORTABLE CHEST 1 VIEW COMPARISON:  11/27/2017. FINDINGS: Endotracheal tube and NG tube in stable position. Stable cardiomegaly. No pulmonary venous congestion. Bibasilar atelectasis unchanged. No pleural effusion or pneumothorax. IMPRESSION: 1.  Lines and tubes in stable position. 2.  Stable cardiomegaly. 3. Bibasilar atelectasis again noted. No significant interim change. Electronically Signed   By: Marcello Moores  Register   On: 11/28/2017 07:50   Dg Chest Port 1 View  Result Date: 11/27/2017 CLINICAL DATA:  CVA, intubated patient, EXAM: PORTABLE CHEST 1 VIEW COMPARISON:  Chest x-ray of November 26, 2017 FINDINGS: The lungs are better inflated today. There is linear increased density overlying the left heart border inferiorly. The cardiac silhouette is enlarged. The pulmonary vascularity is normal. There is no pleural effusion or  pneumothorax. The endotracheal tube tip projects 2.7 cm above the carina. The esophagogastric tube tip and proximal port project below the GE junction. IMPRESSION: Improved aeration of both lungs. Subsegmental atelectasis at the left lung base. The support tubes are in reasonable position. Electronically Signed   By: David  Martinique M.D.   On: 11/27/2017 07:31   Dg Chest Port 1 View  Result Date: 11/26/2017 CLINICAL DATA:  Intubation. EXAM: PORTABLE CHEST 1 VIEW COMPARISON:  None. FINDINGS: The endotracheal tube is 3 cm above  the carina. There is an NG tube coursing down the esophagus and into the stomach. The heart is mildly enlarged and the mediastinal and hilar contours are slightly prominent but probably within normal limits given the AP projection and portable technique. Low lung volumes with vascular crowding and streaky atelectasis. No definite infiltrates or effusions. The bony thorax is intact. IMPRESSION: Support apparatus in good position as discussed above. Low lung volumes with vascular crowding and atelectasis. Electronically Signed   By: Marijo Sanes M.D.   On: 11/26/2017 20:43   Dg Abd Portable 1v  Result Date: 11/26/2017 CLINICAL DATA:  Nasogastric tube placement. EXAM: PORTABLE ABDOMEN - 1 VIEW COMPARISON:  None. FINDINGS: Nasogastric tube coiled in the left upper abdomen with its tip in the proximal to mid stomach and side hole in the proximal stomach. The included portion of the abdomen demonstrates a normal bowel gas pattern. Enlarged cardiac silhouette with a tortuous aorta. Clear lungs with mildly prominent interstitial markings. Thoracic spine degenerative changes and mild scoliosis. IMPRESSION: 1. Nasogastric tube tip and side hole in the stomach. 2. Cardiomegaly and mild chronic interstitial lung disease. Electronically Signed   By: Claudie Revering M.D.   On: 11/26/2017 14:42   Ct Head Code Stroke Wo Contrast  Result Date: 11/25/2017 CLINICAL DATA:  Code stroke. TIA. Initial evaluation. History of substance abuse, hypertension, hyperlipidemia, intracranial hemorrhage. EXAM: CT HEAD WITHOUT CONTRAST TECHNIQUE: Contiguous axial images were obtained from the base of the skull through the vertex without intravenous contrast. COMPARISON:  MRI of the head June 08, 2013 FINDINGS: BRAIN: No intraparenchymal hemorrhage, mass effect nor midline shift. Age indeterminate LEFT corona radiata to the basal ganglia infarct. Old bilateral basal ganglia infarcts. Ex vacuo dilatation RIGHT greater than LEFT lateral  ventricles. 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 Mild global parenchymal brain volume loss for age without hydrocephalus. Confluent supratentorial white matter hypodensities. No acute large vascular territory infarct. No abnormal extra-axial fluid collections. Basal cisterns are patent. VASCULAR: Mildly dense cerebral artery's compatible with hemoconcentration. Mild calcific atherosclerosis carotid siphon and included vertebral artery's. SKULL/SOFT TISSUES: No skull fracture. No significant soft tissue swelling. ORBITS/SINUSES: The included ocular globes and orbital contents are normal.Mild paranasal sinus mucosal thickening without air-fluid levels. Mastoid air cells are well aerated. OTHER: None. ASPECTS [facility] Stroke Program Early CT Score) - Ganglionic level infarction (caudate, lentiform nuclei, internal capsule, insula, M1-M3 cortex): 6 - Supraganglionic infarction (M4-M6 cortex): 3 Total score (0-10 with 10 being normal): 10 IMPRESSION: 1. Potentially acute LEFT corona radiata/basal ganglia infarct. 2. ASPECTS is 9. 3. Old bilateral basal ganglia infarcts. 4. Moderate to severe white matter changes compatible chronic small vessel ischemic disease. 5. Mild global parenchymal brain volume loss for age. Critical Value/emergent results were called by telephone at the time of interpretation on 11/25/2017 at 11:20 pm to Dr. Rory Percy, Neurology, who verbally acknowledged these results. Electronically Signed   By: Elon Alas M.D.   On: 11/25/2017 23:21  Subjective: Patient continue to be aphasic, not in distress, tolerating tube feedings and managing secretions well.   Discharge Exam: Vitals:   12/19/17 0818 12/19/17 1012  BP:  (!) 142/95  Pulse: (!) 109 (!) 108  Resp: 20 20  Temp:  99.4 F (37.4 C)  SpO2: 100% 97%   Vitals:   12/19/17 0529 12/19/17 0754 12/19/17 0818 12/19/17 1012  BP:  (!) 139/102  (!) 142/95  Pulse: 81 (!) 106 (!)  109 (!) 108  Resp: 16 20 20 20   Temp:    99.4 F (37.4 C)  TempSrc:    Oral  SpO2: 97% 100% 100% 97%  Weight:      Height:        General: Not in pain or dyspnea, deconditioned Neurology: aphasia, left hemiparesis.  E ENT: no pallor, no icterus, oral mucosa moist/ trach in place.  Cardiovascular: No JVD. S1-S2 present, rhythmic, no gallops, rubs, or murmurs. No lower extremity edema. Pulmonary: vesicular breath sounds bilaterally, adequate air movement, no wheezing, rhonchi or rales. Gastrointestinal. Abdomen flat, no organomegaly, non tender, no rebound or guarding. PEG in place.  Skin. No rashes Musculoskeletal: no joint deformities   The results of significant diagnostics from this hospitalization (including imaging, microbiology, ancillary and laboratory) are listed below for reference.     Microbiology: Recent Results (from the past 240 hour(s))  Culture, blood (routine x 2)     Status: None   Collection Time: 12/09/17  3:58 PM  Result Value Ref Range Status   Specimen Description BLOOD LEFT ANTECUBITAL  Final   Special Requests   Final    BOTTLES DRAWN AEROBIC AND ANAEROBIC Blood Culture adequate volume   Culture NO GROWTH 5 DAYS  Final   Report Status 12/14/2017 FINAL  Final  Culture, blood (routine x 2)     Status: None   Collection Time: 12/09/17  4:05 PM  Result Value Ref Range Status   Specimen Description BLOOD BLOOD RIGHT HAND  Final   Special Requests   Final    BOTTLES DRAWN AEROBIC AND ANAEROBIC Blood Culture adequate volume   Culture NO GROWTH 5 DAYS  Final   Report Status 12/14/2017 FINAL  Final  Culture, Urine     Status: Abnormal   Collection Time: 12/10/17 12:07 PM  Result Value Ref Range Status   Specimen Description URINE, CLEAN CATCH  Final   Special Requests NONE  Final   Culture MULTIPLE SPECIES PRESENT, SUGGEST RECOLLECTION (A)  Final   Report Status 12/11/2017 FINAL  Final  MRSA PCR Screening     Status: None   Collection Time: 12/10/17   6:27 PM  Result Value Ref Range Status   MRSA by PCR NEGATIVE NEGATIVE Final    Comment:        The GeneXpert MRSA Assay (FDA approved for NASAL specimens only), is one component of a comprehensive MRSA colonization surveillance program. It is not intended to diagnose MRSA infection nor to guide or monitor treatment for MRSA infections.   Culture, respiratory (NON-Expectorated)     Status: None   Collection Time: 12/11/17  9:28 AM  Result Value Ref Range Status   Specimen Description TRACHEAL ASPIRATE  Final   Special Requests NONE  Final   Gram Stain   Final    MODERATE WBC PRESENT, PREDOMINANTLY PMN RARE SQUAMOUS EPITHELIAL CELLS PRESENT FEW GRAM POSITIVE RODS FEW GRAM NEGATIVE RODS    Culture Consistent with normal respiratory flora.  Final   Report Status 12/13/2017 FINAL  Final  Respiratory Panel by PCR     Status: None   Collection Time: 12/11/17 11:53 AM  Result Value Ref Range Status   Adenovirus NOT DETECTED NOT DETECTED Final   Coronavirus 229E NOT DETECTED NOT DETECTED Final   Coronavirus HKU1 NOT DETECTED NOT DETECTED Final   Coronavirus NL63 NOT DETECTED NOT DETECTED Final   Coronavirus OC43 NOT DETECTED NOT DETECTED Final   Metapneumovirus NOT DETECTED NOT DETECTED Final   Rhinovirus / Enterovirus NOT DETECTED NOT DETECTED Final   Influenza A NOT DETECTED NOT DETECTED Final   Influenza B NOT DETECTED NOT DETECTED Final   Parainfluenza Virus 1 NOT DETECTED NOT DETECTED Final   Parainfluenza Virus 2 NOT DETECTED NOT DETECTED Final   Parainfluenza Virus 3 NOT DETECTED NOT DETECTED Final   Parainfluenza Virus 4 NOT DETECTED NOT DETECTED Final   Respiratory Syncytial Virus NOT DETECTED NOT DETECTED Final   Bordetella pertussis NOT DETECTED NOT DETECTED Final   Chlamydophila pneumoniae NOT DETECTED NOT DETECTED Final   Mycoplasma pneumoniae NOT DETECTED NOT DETECTED Final     Labs: BNP (last 3 results) No results for input(s): BNP in the last 8760  hours. Basic Metabolic Panel: Recent Labs  Lab 12/14/17 0258 12/16/17 0440 12/18/17 0416 12/18/17 1709  NA 147* 148* 149* 150*  K 3.7 3.7 4.1 4.0  CL 109 111 110 114*  CO2 26 28 27 27   GLUCOSE 140* 146* 119* 142*  BUN 30* 35* 31* 30*  CREATININE 1.43* 1.63* 1.49* 1.48*  CALCIUM 9.1 9.0 9.2 8.9   Liver Function Tests: No results for input(s): AST, ALT, ALKPHOS, BILITOT, PROT, ALBUMIN in the last 168 hours. No results for input(s): LIPASE, AMYLASE in the last 168 hours. No results for input(s): AMMONIA in the last 168 hours. CBC: Recent Labs  Lab 12/13/17 0402 12/14/17 0258  WBC 7.9 8.9  NEUTROABS  --  6.4  HGB 10.6* 10.7*  HCT 32.0* 34.1*  MCV 70.0* 70.9*  PLT 379 407*   Cardiac Enzymes: No results for input(s): CKTOTAL, CKMB, CKMBINDEX, TROPONINI in the last 168 hours. BNP: Invalid input(s): POCBNP CBG: Recent Labs  Lab 12/18/17 1150 12/18/17 1650 12/18/17 2142 12/19/17 0328 12/19/17 0813  GLUCAP 110* 123* 129* 117* 128*   D-Dimer No results for input(s): DDIMER in the last 72 hours. Hgb A1c No results for input(s): HGBA1C in the last 72 hours. Lipid Profile No results for input(s): CHOL, HDL, LDLCALC, TRIG, CHOLHDL, LDLDIRECT in the last 72 hours. Thyroid function studies No results for input(s): TSH, T4TOTAL, T3FREE, THYROIDAB in the last 72 hours.  Invalid input(s): FREET3 Anemia work up No results for input(s): VITAMINB12, FOLATE, FERRITIN, TIBC, IRON, RETICCTPCT in the last 72 hours. Urinalysis    Component Value Date/Time   COLORURINE YELLOW 12/10/2017 [place] 12/10/2017 1207   LABSPEC 1.016 12/10/2017 1207   PHURINE 6.0 12/10/2017 [place] 12/10/2017 [place] 12/10/2017 [place] 12/10/2017 [place] 12/10/2017 1207   PROTEINUR NEGATIVE 12/10/2017 1207   UROBILINOGEN 0.2 06/07/2013 1844   NITRITE NEGATIVE 12/10/2017 1207   LEUKOCYTESUR NEGATIVE 12/10/2017  1207   Sepsis Labs Invalid input(s): PROCALCITONIN,  WBC,  LACTICIDVEN Microbiology Recent Results (from the past 240 hour(s))  Culture, blood (routine x 2)     Status: None   Collection Time: 12/09/17  3:58 PM  Result Value Ref Range Status   Specimen Description BLOOD LEFT ANTECUBITAL  Final  Special Requests   Final    BOTTLES DRAWN AEROBIC AND ANAEROBIC Blood Culture adequate volume   Culture NO GROWTH 5 DAYS  Final   Report Status 12/14/2017 FINAL  Final  Culture, blood (routine x 2)     Status: None   Collection Time: 12/09/17  4:05 PM  Result Value Ref Range Status   Specimen Description BLOOD BLOOD RIGHT HAND  Final   Special Requests   Final    BOTTLES DRAWN AEROBIC AND ANAEROBIC Blood Culture adequate volume   Culture NO GROWTH 5 DAYS  Final   Report Status 12/14/2017 FINAL  Final  Culture, Urine     Status: Abnormal   Collection Time: 12/10/17 12:07 PM  Result Value Ref Range Status   Specimen Description URINE, CLEAN CATCH  Final   Special Requests NONE  Final   Culture MULTIPLE SPECIES PRESENT, SUGGEST RECOLLECTION (A)  Final   Report Status 12/11/2017 FINAL  Final  MRSA PCR Screening     Status: None   Collection Time: 12/10/17  6:27 PM  Result Value Ref Range Status   MRSA by PCR NEGATIVE NEGATIVE Final    Comment:        The GeneXpert MRSA Assay (FDA approved for NASAL specimens only), is one component of a comprehensive MRSA colonization surveillance program. It is not intended to diagnose MRSA infection nor to guide or monitor treatment for MRSA infections.   Culture, respiratory (NON-Expectorated)     Status: None   Collection Time: 12/11/17  9:28 AM  Result Value Ref Range Status   Specimen Description TRACHEAL ASPIRATE  Final   Special Requests NONE  Final   Gram Stain   Final    MODERATE WBC PRESENT, PREDOMINANTLY PMN RARE SQUAMOUS EPITHELIAL CELLS PRESENT FEW GRAM POSITIVE RODS FEW GRAM NEGATIVE RODS    Culture Consistent with normal  respiratory flora.  Final   Report Status 12/13/2017 FINAL  Final  Respiratory Panel by PCR     Status: None   Collection Time: 12/11/17 11:53 AM  Result Value Ref Range Status   Adenovirus NOT DETECTED NOT DETECTED Final   Coronavirus 229E NOT DETECTED NOT DETECTED Final   Coronavirus HKU1 NOT DETECTED NOT DETECTED Final   Coronavirus NL63 NOT DETECTED NOT DETECTED Final   Coronavirus OC43 NOT DETECTED NOT DETECTED Final   Metapneumovirus NOT DETECTED NOT DETECTED Final   Rhinovirus / Enterovirus NOT DETECTED NOT DETECTED Final   Influenza A NOT DETECTED NOT DETECTED Final   Influenza B NOT DETECTED NOT DETECTED Final   Parainfluenza Virus 1 NOT DETECTED NOT DETECTED Final   Parainfluenza Virus 2 NOT DETECTED NOT DETECTED Final   Parainfluenza Virus 3 NOT DETECTED NOT DETECTED Final   Parainfluenza Virus 4 NOT DETECTED NOT DETECTED Final   Respiratory Syncytial Virus NOT DETECTED NOT DETECTED Final   Bordetella pertussis NOT DETECTED NOT DETECTED Final   Chlamydophila pneumoniae NOT DETECTED NOT DETECTED Final   Mycoplasma pneumoniae NOT DETECTED NOT DETECTED Final     Time coordinating discharge: 45 minutes  SIGNED:   Tawni Millers, MD  [facility] 12/19/2017, 11:31 AM Pager [phone]  If 7PM-7AM, please contact night-coverage [email or web address] Password [email or web address]

## 2017-12-19 NOTE — Progress Notes (Signed)
Notified MD of pt's BP of 143/102(109).

## 2017-12-20 DIAGNOSIS — D509 Iron deficiency anemia, unspecified: Secondary | ICD-10-CM

## 2017-12-20 DIAGNOSIS — R509 Fever, unspecified: Secondary | ICD-10-CM

## 2017-12-20 DIAGNOSIS — R4701 Aphasia: Secondary | ICD-10-CM

## 2017-12-20 DIAGNOSIS — G1222 Progressive bulbar palsy: Secondary | ICD-10-CM

## 2017-12-20 DIAGNOSIS — J690 Pneumonitis due to inhalation of food and vomit: Secondary | ICD-10-CM

## 2017-12-20 DIAGNOSIS — I6300 Cerebral infarction due to thrombosis of unspecified precerebral artery: Secondary | ICD-10-CM

## 2017-12-20 DIAGNOSIS — I10 Essential (primary) hypertension: Secondary | ICD-10-CM

## 2017-12-20 DIAGNOSIS — R1312 Dysphagia, oropharyngeal phase: Secondary | ICD-10-CM

## 2017-12-20 DIAGNOSIS — R471 Dysarthria and anarthria: Secondary | ICD-10-CM

## 2017-12-20 LAB — GLUCOSE, CAPILLARY
GLUCOSE-CAPILLARY: 111 mg/dL — AB (ref 65–99)
Glucose-Capillary: 132 mg/dL — ABNORMAL HIGH (ref 65–99)
Glucose-Capillary: 127 mg/dL — ABNORMAL HIGH (ref 65–99)
Glucose-Capillary: 116 mg/dL — ABNORMAL HIGH (ref 65–99)
Glucose-Capillary: 116 mg/dL — ABNORMAL HIGH (ref 65–99)

## 2017-12-20 NOTE — Progress Notes (Signed)
Per RN, patient having a lot of secretions. [facility] wants to make sure they can handle his needs. Will reassess in the morning to see if he can discharge.   [name] [name] LCSW [phone]

## 2017-12-20 NOTE — Progress Notes (Signed)
Trach care was done; patient was suctioned, his dressing was changed, and trach was cleaned.

## 2017-12-20 NOTE — Progress Notes (Signed)
PROGRESS NOTE  [name] XKG:[id number] DOB: [birth year] DOA: 11/25/2017 PCP: Tawny Asal, MD  HPI/Recap of past 15 hours: 57 year old male who presented with difficulty speaking. He does have significant past medical history for hypertension, dyslipidemia, also substance abuse and history of CVAs with residual weakness. He had a sudden episode of unable to speak, it was persistent and recurrent. Head CT with acute left corona radiata/basal ganglia infarct. Patient was admitted with working diagnosis of acute CVA. His hospital stay was complicated by respiratory failure which required invasive mechanical ventilation. He developed ventilator dependent respiratory failure,with failure to wean, tracheostomy was performed and PEG tube was placed. He was successfully liberated to trach collar, and then transferred to the medical floor.  Today, pt managing his secretions through trach, tolerating tube feedings, looks comfortable. Still not able to talk. Planned for d/c yesterday, still awaiting placement.  Assessment/Plan: Principal Problem:   Stroke [facility]) Active Problems:   Essential hypertension, benign   History of completed stroke   Microcytic anemia   Aphasia   Dysarthria   Oropharyngeal dysphagia   Bulbar palsy (HCC)   Aspiration pneumonia (HCC)   Endotracheally intubated   Acute kidney injury (HCC)   Atelectasis, bilateral   Encounter for nasogastric (NG) tube placement   Postextubation stridor   Acute respiratory failure with hypoxia (HCC)   Aspiration into airway   Hypoxemia   Tracheostomy status (HCC)  Fever of ??unknown origin Reactive vs central No fever in the past 24H Patient responding well to scheduled tylenol Monitor closely for signs of infection  Acute CVA/ left subcortical Severe and persistent neurologic deficit, only able to move his left hand, RUE continues to be contracted Medical management with asa and statin Continue physical therapy and speech  therapy. Pending placement  Respiratory failure, acute hypoxic Managing secretions well per trach collar, suctioning as needed Continue speech therapy with PMV  Aspiration pneumonia Head elevated at all times to prevent aspiration Continue chest pt and as needed bronchodilator therapy  Hypertension Continue amlodipine, carvedilol, and lisinoprilfor blood pressure control.  Acute kidney injuryon CKD stage 2 and worsening hypernatremia Continue free water flushes, increase to 400 ml q 6 hours Continue D5W to 50 ml per hour IV Daily BMET  T2DM SSI, controlled  Lower lip edema and ulceration Monitor closely   Code Status: Full  Family Communication: None at bedside  Disposition Plan: SNF     Consultants:    Procedures:    Antimicrobials:    DVT prophylaxis:  Heparin   Objective: Vitals:   12/20/17 1142 12/20/17 1409 12/20/17 1530 12/20/17 1717  BP:  (!) 135/100  138/90  Pulse: (!) 108 (!) 109 100 (!) 114  Resp: (!) 22 20 (!) 22   Temp:  99.3 F (37.4 C)    TempSrc:  Oral    SpO2: 98% 98% 97%   Weight:      Height:        Intake/Output Summary (Last 24 hours) at 12/20/2017 1959 Last data filed at 12/20/2017 1600 Gross per 24 hour  Intake 1830 ml  Output 1300 ml  Net 530 ml   Filed Weights   12/17/17 0418 12/18/17 0500 12/19/17 0500  Weight: 79.6 kg (175 lb 7.8 oz) 79.4 kg (175 lb 0.7 oz) 79.3 kg (174 lb 13.2 oz)    Exam:   General: Awake, alert, unable to speak, deconditioned  Cardiovascular: S1-S2 present, no added heart sounds  Respiratory: Chest clear bilaterally  Abdomen: Soft, nontender, nondistended, PEG tube  in place  Musculoskeletal: No pedal edema bilaterally  Skin: Normal  Psychiatry: Unable to assess   Data Reviewed: CBC: Recent Labs  Lab 12/14/17 0258  WBC 8.9  NEUTROABS 6.4  HGB 10.7*  HCT 34.1*  MCV 70.9*  PLT 161*   Basic Metabolic Panel: Recent Labs  Lab 12/14/17 0258 12/16/17 0440  12/18/17 0416 12/18/17 1709 12/19/17 1151  NA 147* 148* 149* 150* 148*  K 3.7 3.7 4.1 4.0 4.2  CL 109 111 110 114* 110  CO2 26 28 27 27 25   GLUCOSE 140* 146* 119* 142* 141*  BUN 30* 35* 31* 30* 30*  CREATININE 1.43* 1.63* 1.49* 1.48* 1.45*  CALCIUM 9.1 9.0 9.2 8.9 8.9   GFR: Estimated Creatinine Clearance: 53.8 mL/min (A) (by C-G formula based on SCr of 1.45 mg/dL (H)). Liver Function Tests: No results for input(s): AST, ALT, ALKPHOS, BILITOT, PROT, ALBUMIN in the last 168 hours. No results for input(s): LIPASE, AMYLASE in the last 168 hours. No results for input(s): AMMONIA in the last 168 hours. Coagulation Profile: No results for input(s): INR, PROTIME in the last 168 hours. Cardiac Enzymes: No results for input(s): CKTOTAL, CKMB, CKMBINDEX, TROPONINI in the last 168 hours. BNP (last 3 results) No results for input(s): PROBNP in the last 8760 hours. HbA1C: No results for input(s): HGBA1C in the last 72 hours. CBG: Recent Labs  Lab 12/19/17 2355 12/20/17 0421 12/20/17 0810 12/20/17 1201 12/20/17 1655  GLUCAP 121* 132* 127* 116* 111*   Lipid Profile: No results for input(s): CHOL, HDL, LDLCALC, TRIG, CHOLHDL, LDLDIRECT in the last 72 hours. Thyroid Function Tests: No results for input(s): TSH, T4TOTAL, FREET4, T3FREE, THYROIDAB in the last 72 hours. Anemia Panel: No results for input(s): VITAMINB12, FOLATE, FERRITIN, TIBC, IRON, RETICCTPCT in the last 72 hours. Urine analysis:    Component Value Date/Time   COLORURINE YELLOW 12/10/2017 [place] 12/10/2017 1207   LABSPEC 1.016 12/10/2017 1207   PHURINE 6.0 12/10/2017 [place] 12/10/2017 [place] 12/10/2017 [place] 12/10/2017 1207   KETONESUR NEGATIVE 12/10/2017 1207   PROTEINUR NEGATIVE 12/10/2017 1207   UROBILINOGEN 0.2 06/07/2013 1844   NITRITE NEGATIVE 12/10/2017 1207   LEUKOCYTESUR NEGATIVE 12/10/2017 1207   Sepsis  Labs: @LABRCNTIP (procalcitonin:4,lacticidven:4)  ) Recent Results (from the past 240 hour(s))  Culture, respiratory (NON-Expectorated)     Status: None   Collection Time: 12/11/17  9:28 AM  Result Value Ref Range Status   Specimen Description TRACHEAL ASPIRATE  Final   Special Requests NONE  Final   Gram Stain   Final    MODERATE WBC PRESENT, PREDOMINANTLY PMN RARE SQUAMOUS EPITHELIAL CELLS PRESENT FEW GRAM POSITIVE RODS FEW GRAM NEGATIVE RODS    Culture Consistent with normal respiratory flora.  Final   Report Status 12/13/2017 FINAL  Final  Respiratory Panel by PCR     Status: None   Collection Time: 12/11/17 11:53 AM  Result Value Ref Range Status   Adenovirus NOT DETECTED NOT DETECTED Final   Coronavirus 229E NOT DETECTED NOT DETECTED Final   Coronavirus HKU1 NOT DETECTED NOT DETECTED Final   Coronavirus NL63 NOT DETECTED NOT DETECTED Final   Coronavirus OC43 NOT DETECTED NOT DETECTED Final   Metapneumovirus NOT DETECTED NOT DETECTED Final   Rhinovirus / Enterovirus NOT DETECTED NOT DETECTED Final   Influenza A NOT DETECTED NOT DETECTED Final   Influenza B NOT DETECTED NOT DETECTED Final   Parainfluenza Virus 1 NOT DETECTED NOT  DETECTED Final   Parainfluenza Virus 2 NOT DETECTED NOT DETECTED Final   Parainfluenza Virus 3 NOT DETECTED NOT DETECTED Final   Parainfluenza Virus 4 NOT DETECTED NOT DETECTED Final   Respiratory Syncytial Virus NOT DETECTED NOT DETECTED Final   Bordetella pertussis NOT DETECTED NOT DETECTED Final   Chlamydophila pneumoniae NOT DETECTED NOT DETECTED Final   Mycoplasma pneumoniae NOT DETECTED NOT DETECTED Final      Studies: No results found.  Scheduled Meds: . acetaminophen  500 mg Oral TID  . amLODipine  10 mg Per Tube q morning - 10a  . aspirin  325 mg Per Tube Daily  . atorvastatin  80 mg Per Tube q1800  . baclofen  5 mg Per Tube BID  . carvedilol  3.125 mg Per Tube BID WC  . chlorhexidine  15 mL Mouth Rinse BID  . docusate  100 mg  Per Tube BID  . feeding supplement (PRO-STAT SUGAR FREE 64)  30 mL Per Tube Daily  . free water  400 mL Per Tube QID  . heparin injection (subcutaneous)  5,000 Units Subcutaneous Q8H  . heparin lock flush  250 Units Intracatheter Daily  . insulin aspart  0-9 Units Subcutaneous Q4H  . lisinopril  20 mg Per Tube BID  . mouth rinse  15 mL Mouth Rinse q12n4p  . sennosides  5 mL Per Tube BID  . sodium chloride flush  10-40 mL Intracatheter Q12H  . zolpidem  5 mg Oral QHS    Continuous Infusions: . dextrose 50 mL/hr at 12/20/17 1930  . feeding supplement (JEVITY 1.2 CAL) 1,000 mL (12/20/17 1024)     LOS: 24 days     Alma Friendly, MD [facility]  If 7PM-7AM, please contact night-coverage [email or web address] Password [email or web address] 12/20/2017, 7:59 PM

## 2017-12-20 NOTE — Progress Notes (Signed)
Physical Therapy Treatment Patient Details Name: [name] MRN: [id number] DOB: [birth year] Today's Date: 12/20/2017    History of Present Illness 57 y.o.malewith hx of previous strokes, polysubstance abuse, HLD, HTN, right basal ganglia hemorrhage in 1999, and left basal ganglia hemorrhage 2005, presenting with episodicaphasia vs. dysarthria.Hedid not receive IV t-PA due to initial improvement in deficits and late presentation. 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 Patient developed severe dysphagia and was intubated on 1/13, trach 1/23, PEG 1/24    PT Comments    Pt with improved participation this date and was able to sit EOB x 30 sec with minA. Pt demo'd more initation this date with R UE and LE, trace movement noted in L quad today as well. Acute PT to con't to follow.   Follow Up Recommendations  SNF;Supervision/Assistance - 24 hour     Equipment Recommendations  Wheelchair (measurements PT);Wheelchair cushion (measurements PT)    Recommendations for Other Services       Precautions / Restrictions Precautions Precautions: Fall Precaution Comments: trach, peg, off vent (12/11/17) Restrictions Weight Bearing Restrictions: No    Mobility  Bed Mobility Overal bed mobility: Needs Assistance Bed Mobility: Rolling;Sidelying to Sit;Sit to Supine Rolling: Max assist Sidelying to sit: Max assist   Sit to supine: Max assist   General bed mobility comments: pt able to assist with R UE when rolling L for hygiene, pt did try to bring LEs off EOB, however overall con't to require maxAx2 to complete transfer. pt dependent to return to supine  Transfers                    Ambulation/Gait                 Stairs            Wheelchair Mobility    Modified Rankin (Stroke Patients Only) Modified Rankin (Stroke Patients Only) Pre-Morbid Rankin Score: Moderate  disability Modified Rankin: Severe disability     Balance Overall balance assessment: Needs assistance Sitting-balance support: Feet supported;Single extremity supported Sitting balance-Leahy Scale: Poor Sitting balance - Comments: pt was able to maintain midline position with minA x1 today with max v/c's and assist with trunk flexion to minimize posterior lean. pt tolerated sitting EOB x 10 min                                    Cognition Arousal/Alertness: Awake/alert Behavior During Therapy: Flat affect                           Following Commands: (pt with increased command follow to 100% today)     Problem Solving: Difficulty sequencing;Requires verbal cues;Requires tactile cues(pt with improved initiation today with R UE/LE) General Comments: pt with improve participation this date      Exercises Other Exercises Other Exercises: quad sets and AA heel slide to bilat LEs x 10 reps    General Comments General comments (skin integrity, edema, etc.): worked on trunk rotation in sitting      Pertinent Vitals/Pain Pain Assessment: Faces Faces Pain Scale: No hurt    Home Living                      Prior Function            PT  Goals (current goals can now be found in the care plan section) Progress towards PT goals: Progressing toward goals    Frequency    Min 2X/week      PT Plan Current plan remains appropriate;Frequency needs to be updated    Co-evaluation              AM-PAC PT "6 Clicks" Daily Activity  Outcome Measure  Difficulty turning over in bed (including adjusting bedclothes, sheets and blankets)?: Unable Difficulty moving from lying on back to sitting on the side of the bed? : Unable Difficulty sitting down on and standing up from a chair with arms (e.g., wheelchair, bedside commode, etc,.)?: Unable Help needed moving to and from a bed to chair (including a wheelchair)?: Total Help needed walking in  hospital room?: Total Help needed climbing 3-5 steps with a railing? : Total 6 Click Score: 6    End of Session Equipment Utilized During Treatment: Oxygen Activity Tolerance: Patient tolerated treatment well Patient left: in bed;with bed alarm set;with nursing/sitter in room Nurse Communication: Mobility status;Need for lift equipment PT Visit Diagnosis: Other symptoms and signs involving the nervous system (R29.898);Other abnormalities of gait and mobility (R26.89);Muscle weakness (generalized) (M62.81)     Time: 1165-7903 PT Time Calculation (min) (ACUTE ONLY): 28 min  Charges:  $Therapeutic Exercise: 8-22 mins $Therapeutic Activity: 8-22 mins                    G Codes:       Kittie Plater, PT, DPT Pager #: [phone] Office #: [phone]    [place] 12/20/2017, 1:45 PM

## 2017-12-20 NOTE — Progress Notes (Signed)
  Speech Language Pathology Treatment: [name] Speaking valve  Patient Details Name: [name] MRN: [id number] DOB: 1960-12-18 Today's Date: 12/20/2017 Time: 9244-6286 SLP Time Calculation (min) (ACUTE ONLY): 10 min  Assessment / Plan / Recommendation Clinical Impression  PMV was placed for up to 5 minutes today with no obvious back pressure despite subtle, subjective increase in WOB. He was lethargic throughout session though and made no attempts at vocalizing or coughing despite Max multimodal cues. He did not open his eyes. Session was therefore kept brief. Recommend continued SLP f/u acutely and at SNF to facilitate functional communication.   HPI HPI: 57 y.o. male who presented with difficulty speaking. He does have significant past medical history of CVAs with residual weakness. Head CT with acute left corona radiata/basal ganglia infarct. His hospital stay was complicated by respiratory failure which required invasive mechanical ventilation. He developed ventilator dependent respiratory failure,with failure to wean, tracheostomy was performed January 23rd, and PEG tube was placed on the 24th. He was successfully liberated to trach collar, and then transferred to the medical floor.      SLP Plan  Continue with current plan of care       Recommendations         Patient may use Passy-Muir Speech Valve: with SLP only PMSV Supervision: Full MD: Please consider changing trach tube to : Smaller size         Follow up Recommendations: Skilled Nursing facility SLP Visit Diagnosis: Aphonia (R49.1) Plan: Continue with current plan of care       GO                [name] 12/20/2017, 12:02 PM  [name], M.A. CCC-SLP [phone]

## 2017-12-21 LAB — BASIC METABOLIC PANEL
Anion gap: 13 (ref 5–15)
BUN: 21 mg/dL — AB (ref 6–20)
CALCIUM: 8.6 mg/dL — AB (ref 8.9–10.3)
CO2: 24 mmol/L (ref 22–32)
Chloride: 106 mmol/L (ref 101–111)
Creatinine, Ser: 1.2 mg/dL (ref 0.61–1.24)
GFR calc Af Amer: >60 mL/min (ref >60)
GLUCOSE: 141 mg/dL — AB (ref 65–99)
Potassium: 3.6 mmol/L (ref 3.5–5.1)
Sodium: 143 mmol/L (ref 135–145)

## 2017-12-21 LAB — GLUCOSE, CAPILLARY
GLUCOSE-CAPILLARY: 116 mg/dL — AB (ref 65–99)
GLUCOSE-CAPILLARY: 109 mg/dL — AB (ref 65–99)
Glucose-Capillary: 106 mg/dL — ABNORMAL HIGH (ref 65–99)
Glucose-Capillary: 108 mg/dL — ABNORMAL HIGH (ref 65–99)
Glucose-Capillary: 118 mg/dL — ABNORMAL HIGH (ref 65–99)
Glucose-Capillary: 134 mg/dL — ABNORMAL HIGH (ref 65–99)

## 2017-12-21 MED ORDER — KETOROLAC TROMETHAMINE 30 MG/ML IJ SOLN
30.0000 mg | Freq: Once | INTRAMUSCULAR | Status: AC | PRN
  Administered 2017-12-21: 30 mg via INTRAVENOUS
  Filled 2017-12-21: qty 1

## 2017-12-21 MED ORDER — HYDRALAZINE HCL 20 MG/ML IJ SOLN: 10.0000 mg | Freq: Once | INTRAMUSCULAR | Status: DC

## 2017-12-21 MED ORDER — HYDRALAZINE HCL 20 MG/ML IJ SOLN
10.0000 mg | INTRAMUSCULAR | Status: DC | PRN
  Filled 2017-12-21: qty 1

## 2017-12-21 MED ORDER — CARVEDILOL 12.5 MG PO TABS
12.5000 mg | ORAL_TABLET | Freq: Once | ORAL | Status: AC
  Administered 2017-12-21: 12.5 mg via ORAL
  Filled 2017-12-21: qty 1

## 2017-12-21 MED ORDER — HYDRALAZINE HCL 50 MG PO TABS
50.0000 mg | ORAL_TABLET | Freq: Once | ORAL | Status: AC
Start: 2017-12-21 — End: 2017-12-21
  Administered 2017-12-21: 50 mg via ORAL
  Filled 2017-12-21: qty 1

## 2017-12-21 MED ORDER — KETOROLAC TROMETHAMINE 30 MG/ML IJ SOLN: 30.0000 mg | Freq: Once | INTRAMUSCULAR | Status: DC

## 2017-12-21 NOTE — Progress Notes (Signed)
Attempted to give report to an Therapist, sports at [facility] but all the nurses are in a meeting. I gave the receptionist my phone number and name, so they can call me back.

## 2017-12-21 NOTE — Clinical Social Work Placement (Signed)
   CLINICAL SOCIAL WORK PLACEMENT  NOTE  Date:  12/21/2017  Patient Details  Name: [name] MRN: [id number] Date of Birth: 1961/06/13  Clinical Social Work is seeking post-discharge placement for this patient at the [place] level of care (*CSW will initial, date and re-position this form in  chart as items are completed):  Yes   Patient/family provided with [place] Work Department's list of facilities offering this level of care within the geographic area requested by the patient (or if unable, by the patient's family).  Yes   Patient/family informed of their freedom to choose among providers that offer the needed level of care, that participate in Medicare, Medicaid or managed care program needed by the patient, have an available bed and are willing to accept the patient.  Yes   Patient/family informed of [facility]'s ownership interest in [facility] and [facility], as well as of the fact that they are under no obligation to receive care at these facilities.  PASRR submitted to EDS on 12/13/17     PASRR number received on 12/13/17     Existing PASRR number confirmed on       FL2 transmitted to all facilities in geographic area requested by pt/family on 12/13/17     FL2 transmitted to all facilities within larger geographic area on       Patient informed that his/her managed care company has contracts with or will negotiate with certain facilities, including the following:        Yes   Patient/family informed of bed offers received.  Patient [facility] bed at [facility]     Physician recommends and patient chooses bed at      Patient to be transferred to [facility] on 12/21/17.  Patient to be transferred to facility by [facility]     Patient family notified on 12/21/17 of transfer.  Name of family member notified:  Spouse     PHYSICIAN Please prepare priority discharge summary, including medications      Additional Comment:    _______________________________________________ Benard Halsted, [place] 12/21/2017, 12:27 PM

## 2017-12-21 NOTE — Progress Notes (Signed)
Patient will DC to: [facility] Anticipated DC date: 12/21/17 Family notified: Spouse at bedside Transport by: [facility] 4:30pm   Per MD patient ready for DC to [facility]. RN, patient, patient's family, and facility notified of DC. Discharge Summary sent to facility. RN given number for report [phone]). DC packet on chart. Ambulance transport requested for patient.   CSW signing off.  Cedric Fishman, [place] Social Worker [phone]

## 2017-12-21 NOTE — Progress Notes (Signed)
Trach care was done and dressing was changed.

## 2017-12-21 NOTE — Progress Notes (Signed)
MD notified of blood pressure of 145/112 one hour after he received carvedilol.

## 2017-12-21 NOTE — Progress Notes (Signed)
Blood pressure of 185/138, MD was notified. Discharge was canceled and doctor will talk to the wife tomorrow. Family has been talked to.

## 2017-12-21 NOTE — Progress Notes (Signed)
Nutrition Follow-up  DOCUMENTATION CODES:   Not applicable  INTERVENTION:  Continue TF regimen  Jevity 1.2 @ 65 ml/hr (1560 ml/day) 30 ml Prostat daily  Provides 1972 calories, 101gm protein, 1251m free water 4041mFree water QID per MD --> 286529motal free water  NUTRITION DIAGNOSIS:   Inadequate oral intake related to inability to eat as evidenced by NPO status. -ongoing  GOAL:   Patient will meet greater than or equal to 90% of their needs -met with TF  MONITOR:   TF tolerance, I & O's  Pt with PMH of HTN, CHF, hyperlipidemia, polysubstance abuse, CKD stage II, and multiple CVA. Pt admitted with stroke. Asarthria and dysphagia likely due to pseudobulbar etiology. Head CT showed possible acute left basal ganglia infarct. Emergently intubated 11/26/17 to protect airway due to difficulty managing secretions.  1/23 Trach 1/24 PEG  Tolerating TFs Wt stable No complaints, No needs. ~200cc suction output Awaiting placement  Labs reviewed:  CBGs 116, 118, 134 BUN 21  Medications reviewed and include:  Senokot, Colace,  D5 at 59m15m --> 204 calories  Diet Order:  NPO  EDUCATION NEEDS:   Not appropriate for education at this time  Skin:  Skin Assessment: Reviewed RN Assessment  Last BM:  12/20/2017  Height:   Ht Readings from Last 1 Encounters:  11/25/17 _0  (1.702 m)    Weight:   Wt Readings from Last 1 Encounters:  12/21/17 170 lb 6.7 oz (77.3 kg)    Ideal Body Weight:  67.3 kg  BMI:  Body mass index is 26.69 kg/m.  Estimated Nutritional Needs:   Kcal:  1900-2100  Protein:  90-115 grams  Fluid:  > 1.9 L/day   WillSatira Anisrd, MS, RD LDN Inpatient Clinical Dietitian Pager [phone]

## 2017-12-21 NOTE — Progress Notes (Signed)
Patient has new blood coming from his trach, along with thick green mucus secretions, and still runs fevers.

## 2017-12-21 NOTE — Progress Notes (Signed)
MD notified of patient's blood pressure of 163/111.

## 2017-12-21 NOTE — Progress Notes (Signed)
Occupational Therapy Treatment Patient Details Name: [name] MRN: [id number] DOB: 1961/10/11 Today's Date: 12/21/2017    History of present illness 57 y.o.malewith hx of previous strokes, polysubstance abuse, HLD, HTN, right basal ganglia hemorrhage in 1999, and left basal ganglia hemorrhage 2005, presenting with episodicaphasia vs. dysarthria.Hedid not receive IV t-PA due to initial improvement in deficits and late presentation. 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 Patient developed severe dysphagia and was intubated on 1/13, trach 1/23, PEG 1/24   OT comments  Wife available for therapy session. Educated in positioning pt in bed, importance of pressure relief/frequent change of position, L UE PROM. Pt with lethargy. Not consistently following commands. Wife reports pt will use thumbs up/down to communicate, but not observed this session. Pt without initiation to wipe his face when presented with washcloth or with change of gown. Wife with concerns about pt going to SNF, does not want pt to think he was dumped there. Assured her that pt will be getting therapy there to help him work toward goals.   Follow Up Recommendations  SNF;Supervision/Assistance - 24 hour    Equipment Recommendations  None recommended by OT    Recommendations for Other Services      Precautions / Restrictions Precautions Precautions: Fall Precaution Comments: trach, peg, L hemi       Mobility Bed Mobility Overal bed mobility: Needs Assistance Bed Mobility: Rolling Rolling: Total assist         General bed mobility comments: no initiation  Transfers                      Balance                                           ADL either performed or assessed with clinical judgement   ADL Overall ADL's : Needs assistance/impaired     Grooming: Wash/dry hands;Wash/dry face;Bed  level;Maximal assistance(hand over hand assist)           Upper Body Dressing : Total assistance;Bed level   Lower Body Dressing: Total assistance;Bed level Lower Body Dressing Details (indicate cue type and reason): PRAFOs               General ADL Comments: following commands inconsistently (10%) with multimodal cues.     Vision       Perception     Praxis      Cognition Arousal/Alertness: Lethargic Behavior During Therapy: Flat affect Overall Cognitive Status: Difficult to assess Area of Impairment: Attention;Following commands;Problem solving                   Current Attention Level: Focused   Following Commands: Follows one step commands inconsistently     Problem Solving: Difficulty sequencing;Requires verbal cues;Requires tactile cues General Comments: pt with less participation this visit        Exercises Exercises: Other exercises Other Exercises Other Exercises: PROM, passive stretching L UE, FF to 90 degrees only   Shoulder Instructions       General Comments      Pertinent Vitals/ Pain       Pain Assessment: Faces Faces Pain Scale: Hurts a little bit Pain Location: L shoulder with ROM, limited to 90 degrees FF Pain Descriptors / Indicators: Grimacing;Guarding Pain Intervention(s): Monitored during session;Repositioned  Home Living  Prior Functioning/Environment              Frequency  Min 2X/week        Progress Toward Goals  OT Goals(current goals can now be found in the care plan section)  Progress towards OT goals: Not progressing toward goals - comment(pt with lethargy)  Acute Rehab OT Goals Patient Stated Goal: wife wants pt to be able to talk OT Goal Formulation: Patient unable to participate in goal setting Time For Goal Achievement: 01/04/18 Potential to Achieve Goals: [place] Discharge plan remains appropriate    Co-evaluation                  AM-PAC PT "6 Clicks" Daily Activity     Outcome Measure   Help from another person eating meals?: Total Help from another person taking care of personal grooming?: Total Help from another person toileting, which includes using toliet, bedpan, or urinal?: Total Help from another person bathing (including washing, rinsing, drying)?: Total Help from another person to put on and taking off regular upper body clothing?: Total Help from another person to put on and taking off regular lower body clothing?: Total 6 Click Score: 6    End of Session Equipment Utilized During Treatment: Oxygen(via trach )  OT Visit Diagnosis: Muscle weakness (generalized) (M62.81);Other symptoms and signs involving cognitive function;Hemiplegia and hemiparesis Hemiplegia - Right/Left: Left Hemiplegia - caused by: Cerebral infarction   Activity Tolerance Patient limited by fatigue   Patient Left in bed;with call Fuller/phone within reach;with family/visitor present;with bed alarm set   Nurse Communication          Time: 1856-3149 OT Time Calculation (min): 29 min  Charges: OT General Charges $OT Visit: 1 Visit OT Treatments $Therapeutic Activity: 23-37 mins  12/21/2017 Nestor Lewandowsky, OTR/L Pager: [phone]  [name] 12/21/2017, 10:04 AM

## 2017-12-22 LAB — GLUCOSE, CAPILLARY
GLUCOSE-CAPILLARY: 109 mg/dL — AB (ref 65–99)
GLUCOSE-CAPILLARY: 126 mg/dL — AB (ref 65–99)
Glucose-Capillary: 128 mg/dL — ABNORMAL HIGH (ref 65–99)
Glucose-Capillary: 117 mg/dL — ABNORMAL HIGH (ref 65–99)

## 2017-12-22 NOTE — Progress Notes (Signed)
Physical Therapy Treatment Patient Details Name: [name] MRN: [id number] DOB: [birth year] Today's Date: 12/22/2017    History of Present Illness Pt is a 57 y.o.malewith hx of previous strokes, polysubstance abuse, HLD, HTN, right basal ganglia hemorrhage in 1999, and left basal ganglia hemorrhage 2005, presenting with episodicaphasia vs. dysarthria.Hedid not receive IV t-PA due to initial improvement in deficits and late presentation. 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 Patient developed severe dysphagia and was intubated on 1/13, trach 1/23, PEG 1/24    PT Comments    Pt continues to require heavy physical assistance of two for bed mobility and mod-max A to maintain upright sitting at EOB. Pt would continue to benefit from skilled physical therapy services at this time while admitted and after d/c to address the below listed limitations in order to improve overall safety and independence with functional mobility.    Follow Up Recommendations  SNF;Supervision/Assistance - 24 hour     Equipment Recommendations  Wheelchair (measurements PT);Wheelchair cushion (measurements PT)    Recommendations for Other Services       Precautions / Restrictions Precautions Precautions: Fall Precaution Comments: trach, peg, L hemi Restrictions Weight Bearing Restrictions: No    Mobility  Bed Mobility Overal bed mobility: Needs Assistance Bed Mobility: Supine to Sit;Sit to Supine     Supine to sit: Max assist;+2 for safety/equipment Sit to supine: Total assist;+2 for physical assistance   General bed mobility comments: heavy physical assistance with bilateral LE movement and for trunk elevation, total A to return to supine  Transfers                 General transfer comment: requires mechanical lift  Ambulation/Gait                 Stairs            Wheelchair Mobility     Modified Rankin (Stroke Patients Only)       Balance Overall balance assessment: Needs assistance Sitting-balance support: Feet supported;Single extremity supported Sitting balance-Leahy Scale: Poor Sitting balance - Comments: pt required mod-max A throughout and with heavy posterior lean/pushing with fatigue after ~5 mins sitting EOB Postural control: Posterior lean                                  Cognition Arousal/Alertness: Awake/alert Behavior During Therapy: Flat affect Overall Cognitive Status: Difficult to assess Area of Impairment: Attention;Following commands;Problem solving                   Current Attention Level: Focused   Following Commands: Follows one step commands inconsistently     Problem Solving: Difficulty sequencing;Requires verbal cues;Requires tactile cues        Exercises      General Comments        Pertinent Vitals/Pain Pain Assessment: Faces Faces Pain Scale: No hurt    Home Living                      Prior Function            PT Goals (current goals can now be found in the care plan section) Acute Rehab PT Goals PT Goal Formulation: With patient Time For Goal Achievement: 01/05/18 Potential to Achieve Goals: Fair Progress towards PT goals: Not progressing toward goals - comment(GOALS HAVE BEEN UPDATED)  Frequency    Min 2X/week      PT Plan Current plan remains appropriate    Co-evaluation              AM-PAC PT "6 Clicks" Daily Activity  Outcome Measure  Difficulty turning over in bed (including adjusting bedclothes, sheets and blankets)?: Unable Difficulty moving from lying on back to sitting on the side of the bed? : Unable Difficulty sitting down on and standing up from a chair with arms (e.g., wheelchair, bedside commode, etc,.)?: Unable Help needed moving to and from a bed to chair (including a wheelchair)?: Total Help needed walking in hospital room?: Total Help  needed climbing 3-5 steps with a railing? : Total 6 Click Score: 6    End of Session Equipment Utilized During Treatment: Oxygen(trach collar) Activity Tolerance: Patient limited by fatigue Patient left: in bed;with call Adkins/phone within reach;with bed alarm set;Other (comment)(PRAFO's donned bilaterally) Nurse Communication: Mobility status;Need for lift equipment PT Visit Diagnosis: Other symptoms and signs involving the nervous system (R29.898);Other abnormalities of gait and mobility (R26.89);Muscle weakness (generalized) (M62.81)     Time: 1350-1407 PT Time Calculation (min) (ACUTE ONLY): 17 min  Charges:  $Therapeutic Activity: 8-22 mins                    G Codes:       [place], [place], [place] 12/22/2017, 3:07 PM

## 2017-12-22 NOTE — Progress Notes (Signed)
Realized IV was left in patient at discharge. Attempted to call [facility] x 3 after initial report but phone kept ringing without a response. Charge nurse aware.

## 2017-12-22 NOTE — Discharge Summary (Signed)
Physician Discharge Summary  [name] ATF:[id number] DOB: 08-10-1961 DOA: 11/25/2017  PCP: Tawny Asal, MD  Admit date: 11/25/2017 Discharge date: 12/22/2017  Admitted From: Home Disposition:  SNF  Recommendations for Outpatient Follow-up and new medication changes:  1. Follow up with PCP in 1- week. 2. Patient has been placed on aspirin and clopidogrel 3. High potency, high dose statin 4. Continue physical and speech therapy   Home Health: no   Equipment/Devices: no   Discharge Condition: stable CODE STATUS: full  Diet recommendation: Tube feedings.   Brief/Interim Summary: 57 year old male who presented with difficulty speaking. He does have a significant past medical history for hypertension, dyslipidemia, also substance abuse and history of CVAs with residual weakness. He had a sudden episode of unable to speak, it was persistent and recurrent. On his initial physical examination blood pressure 163/125, heart rate 93, respiratory rate 16, temperature 98.5, oxygen saturation 98%. Moist mucous membranes, lungs clear to auscultation bilaterally, no wheezing rales or rhonchi, heart S1-S2 present rhythmic, no gallops, rubs or murmurs, the abdomen was soft nontender, no lower extremity edema. Patient had dysarthria and expressive aphasia. Sodium 142, potassium 3.3, bicarbonate 23, glucose 119, BUN 9, creatinine 1.3, white count 5.2, hemoglobin 13.9, hematocrit 41.8, platelets 249. Urinalysis negative for infection. Head CT with acute left corona radiata/basal ganglia infarct.   Patient was admitted with working diagnosis of acute CVA.  His hospital stay was complicated by respiratory failure which required invasive mechanical ventilation. He developed ventilator dependent respiratory failure,with failure to wean, tracheostomy was performed on January 23rd, and PEG tube was placed in January 24. He was successfully liberated to trach collar, and then transferred to the medical  floor.  Overnight, patient was noted to be persistently hypertensive, home blood pressure medications were restarted.  Patient looks more comfortable, blood pressure much better control.  Patient stable to be discharged to SNF   1.  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  Patient with severe and persistent neurologic deficit, only able to move his distal right upper extremity.  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  Further workup with head MRI showed left basal ganglia and corona radiata infarcts, CT angiography showed right M1, M2, and left M2 stenosis.  His echocardiogram had normal LV systolic function 27-06%.  Patient has been placed on dual antiplatelet therapy with aspirin and clopidogrel.  He was seen by physical therapy and speech therapy, he will be transferred to skilled nurse facility to continue rehabilitation.   2.  Aspiration pneumonia with acute hypoxic respiratory failure status post tracheostomy.  Shiley cuff less #6.  Patient went into respiratory distress, had difficulty managing his secretions and managing his airway, then he underwent intubation plus invasive mechanical ventilation.  It was suspected that he developed an aspiration pneumonia. He was placed on broad-spectrum antibiotic therapy.  He was extubated January 19, but developed stridor and respiratory distress requiring reintubation.  He failed further spontaneous breathing trials, he required tracheostomy January 23, and he was successfully transitioned to a trach collar.  Over the last 72 hours he has been able to manage his secretions well, has a strong cough.  Continue speech therapy.  Patient developed a lower lip ulceration, consequence of prolonged invasive mechanical ventilation, continue local wound care.   3.  Central fevers.  Patient had fever spikes after completing antibiotic therapy, extensive workup was done, no deep infection  identified.  No further antibiotic therapy was  prescribed.  Continue as needed antipyretics.  4.  Acute kidney injury chronic disease stage II, hypernatremia.  Patient received IV fluids with improvement of kidney function, his discharge creatinine 1.48 with a potassium of 4.0.  Patient is receiving free water flushes per PEG tube to control his hypernatremia. Recommend a close follow-up on electrolytes.   5.  Type 2 diabetes mellitus.  Patient was placed on insulin sliding scale glucose coverage monitor, capillary glucose remained well controlled.  Tolerating well tube feeds.   6.  Hypertension.  Continue blood pressure control with amlodipine, carvedilol and lisinopril.  Discharge Diagnoses:  Principal Problem:   Stroke [facility]) Active Problems:   Essential hypertension, benign   History of completed stroke   Microcytic anemia   Aphasia   Dysarthria   Oropharyngeal dysphagia   Bulbar palsy (HCC)   Aspiration pneumonia (HCC)   Endotracheally intubated   Acute kidney injury ([place])   Atelectasis, bilateral   Encounter for nasogastric (NG) tube placement   Postextubation stridor   Acute respiratory failure with hypoxia (HCC)   Aspiration into airway   Hypoxemia   Tracheostomy status ([place])    Discharge Instructions  Discharge Instructions    Diet - low sodium heart healthy   Complete by:  As directed    Discharge instructions   Complete by:  As directed    Please follow with primary care in 7 days.   Increase activity slowly   Complete by:  As directed      Allergies as of 12/22/2017   No Known Allergies     Medication List    STOP taking these medications   hydrochlorothiazide 25 MG tablet Commonly known as:  HYDRODIURIL     TAKE these medications   acetaminophen 325 MG tablet Commonly known as:  TYLENOL Take 2 tablets (650 mg total) by mouth every 4 (four) hours as needed for mild pain (100.4 F).   albuterol (2.5 MG/3ML) 0.083% nebulizer solution Commonly known  as:  PROVENTIL Take 3 mLs (2.5 mg total) by nebulization every 2 (two) hours as needed for wheezing.   amLODipine 10 MG tablet Commonly known as:  NORVASC Take 1 tablet (10 mg total) by mouth every morning.   aspirin 81 MG tablet Take 1 tablet (81 mg total) by mouth daily.   atorvastatin 80 MG tablet Commonly known as:  LIPITOR Place 1 tablet (80 mg total) into feeding tube daily at 6 PM. What changed:    medication strength  how much to take  how to take this  when to take this   Baclofen 5 MG Tabs Place 5 mg into feeding tube 2 (two) times daily.   carvedilol 3.125 MG tablet Commonly known as:  COREG Place 1 tablet (3.125 mg total) into feeding tube 2 (two) times daily with a meal.   clopidogrel 75 MG tablet Commonly known as:  PLAVIX Place 1 tablet (75 mg total) into feeding tube daily.   docusate 50 MG/5ML liquid Commonly known as:  COLACE Place 10 mLs (100 mg total) into feeding tube 2 (two) times daily.   feeding supplement (JEVITY 1.2 CAL) Liqd Place 1,000 mLs into feeding tube continuous.   feeding supplement (PRO-STAT SUGAR FREE 64) Liqd Place 30 mLs into feeding tube daily.   free water Soln Place 300 mLs into feeding tube 4 (four) times daily.   HM MULTIVITAMIN ADULT GUMMY PO Take 1 tablet by mouth every morning.   ibuprofen 100 MG/5ML suspension Commonly known as:  ADVIL,MOTRIN Place  20 mLs (400 mg total) into feeding tube every 6 (six) hours as needed for fever.   insulin aspart 100 UNIT/ML injection Commonly known as:  novoLOG Inject 0-9 Units into the skin every 4 (four) hours. For glucose 150-200 use 2 units, for 201-250 use 4 units, for 251 to 300 use 6 units, for 301 to 350 use 8 units, for 351 or greater use 10 units.   lisinopril 20 MG tablet Commonly known as:  PRINIVIL,ZESTRIL Take 1 tablet (20 mg total) by mouth daily.      Contact information for after-discharge care    [facility] .   Service:   Skilled Nursing Contact information: [address] [place] [phone]             No Known Allergies  Consultations:  Neurology   Procedures/Studies: Ct Angio Head W Or Wo Contrast  Result Date: 11/26/2017 CLINICAL DATA:  Follow-up code stroke. History of stroke, substance abuse, hypertension, hyperlipidemia. EXAM: CT ANGIOGRAPHY HEAD AND NECK CT PERFUSION BRAIN TECHNIQUE: Multidetector CT imaging of the head and neck was performed using the standard protocol during bolus administration of intravenous contrast. Multiplanar CT image reconstructions and MIPs were obtained to evaluate the vascular anatomy. Carotid stenosis measurements (when applicable) are obtained utilizing NASCET criteria, using the distal internal carotid diameter as the denominator. Multiphase CT imaging of the brain was performed following IV bolus contrast injection. Subsequent parametric perfusion maps were calculated using RAPID software. CONTRAST:  140mL ISOVUE-370 IOPAMIDOL (ISOVUE-370) INJECTION 76% COMPARISON:  CT HEAD November 25, 2017 at 11:11 p.m. MRA head and neck January 07, 2007 FINDINGS: CTA NECK FINDINGS: AORTIC ARCH: 3.6 cm ascending aorta, normal branch pattern. Mild intimal thickening aortic arch. The origins of the innominate, left Common carotid artery and subclavian artery are widely patent. Mild stenosis mid LEFT subclavian artery. RIGHT CAROTID SYSTEM: Common carotid artery is widely patent, coursing in a straight line fashion. Normal appearance of the carotid bifurcation without hemodynamically significant stenosis by NASCET criteria. Tortuous course associated with hypertension. LEFT CAROTID SYSTEM: Common carotid artery is widely patent, coursing in a straight line fashion. Normal appearance of the carotid bifurcation without hemodynamically significant stenosis by NASCET criteria. Tortuous course associated with hypertension, widely patent; mild luminal irregularity  most compatible with atherosclerosis. VERTEBRAL ARTERIES:New, age indeterminate LEFT V1 non flow limiting dissection flap without pseudo aneurysm. Mild LEFT vertebral artery luminal irregularity. Mild calcific atherosclerosis RIGHT vertebral artery origin. Bilateral vertebral arteries are patent. SKELETON: No acute osseous process though bone windows have not been submitted. Poor dentition. OTHER NECK: Soft tissues of the neck are nonacute though, not tailored for evaluation. UPPER CHEST: Included lung apices are clear. No superior mediastinal lymphadenopathy. CTA HEAD FINDINGS: ANTERIOR CIRCULATION: Patent cervical internal carotid arteries, petrous, cavernous and supra clinoid internal carotid arteries. Calcific atherosclerosis resulting in moderate to severe stenosis RIGHT para clinoid internal carotid artery. Bilateral 2 mm paraophthalmic outpouchings (axial 103/352). Patent anterior communicating artery. Patent anterior and middle cerebral arteries. Moderate tandem stenoses bilateral anterior cerebral arteries. Moderate stenosis RIGHT M1 origin. Moderate to severe stenosis distal RIGHT M2. Focally occluded versus severe stenosis proximal RIGHT M3 segment. Severe stenosis LEFT M2 inferior division origin. No large vessel occlusion, significant stenosis, contrast extravasation or aneurysm. POSTERIOR CIRCULATION: Patent vertebral arteries, vertebrobasilar junction and basilar artery, as well as main branch vessels with mild luminal irregularity. Moderate stenosis RIGHT V4 segment with mild poststenotic dilatation. Patent posterior cerebral arteries. Moderate to severe  tandem stenoses RIGHT posterior cerebral artery. Moderate tandem stenosis LEFT posterior cerebral artery. No large vessel occlusion, significant stenosis, contrast extravasation or aneurysm. VENOUS SINUSES: Major dural venous sinuses are patent though not tailored for evaluation on this angiographic examination. ANATOMIC VARIANTS: None. DELAYED  PHASE: Not performed. MIP images reviewed. CT Brain Perfusion Findings: CBF (<30%) Volume: 50mL Perfusion (Tmax>6.0s) volume: 83mL Mismatch Volume: 52mL Infarction Location:Perfusion mismatch bifrontal lobes, likely artifact. Prolonged mean transit time RIGHT basal ganglia and to lesser extent RIGHT MCA territory. IMPRESSION: CTA NECK: 1. No hemodynamically significant stenosis or acute vascular process in the neck. 2. 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 No pseudoaneurysm. 3. **An incidental finding of potential clinical significance has been found. Ectatic 3.6 cm ascending aorta. Recommend annual imaging followup by CTA or MRA. This recommendation follows 2010 ACCF/AHA/AATS/ACR/ASA/SCA/SCAI/SIR/STS/SVM Guidelines for the Diagnosis and Management of Patients with Thoracic Aortic Disease. Circulation.2010; 121: U202-R427** CTA HEAD: 1. No emergent large vessel occlusion. Focally occluded RIGHT M2/M3 segment. 2. Advanced intracranial atherosclerosis with multifocal high-grade stenosis including severe stenosis LEFT M2 segment, moderate to severe stenoses RIGHT middle cerebral artery and LEFT posterior cerebral artery. 3. 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 CT PERFUSION: 1. No acute perfusion defect. 2. Prolonged mean transit time RIGHT MCA consistent with stenoses. Acute findings discussed with and reconfirmed by Dr.ASHISH ARORA on 11/25/2017 at 12:00 am. Electronically Signed   By: Elon Alas M.D.   On: 11/26/2017 00:03   Ct Head Wo Contrast  Result Date: 12/03/2017 CLINICAL DATA:  Follow-up of the lacunar infarcts EXAM: CT HEAD WITHOUT CONTRAST TECHNIQUE: Contiguous axial images were obtained from the base of the skull through the vertex without intravenous contrast. COMPARISON:  Brain MRI 11/27/2017 FINDINGS: Brain: Hypoattenuation in the left basal ganglia and corona radiata corresponds to known recent infarcts. No hemorrhage or  significant mass effect. There is encephalomalacia of the right external capsule with hypoattenuation in the bilateral periventricular white matter, right worse than left. There is ex vacuo dilatation of the right lateral ventricle. Vascular: No hyperdense vessel or unexpected calcification. Skull: Normal visualized skull base, calvarium and extracranial soft tissues. Sinuses/Orbits: No sinus fluid levels or advanced mucosal thickening. No mastoid effusion. Normal orbits. IMPRESSION: Expected evolution of left basal ganglia/corona radiata infarcts without acute hemorrhage or significant mass effect. Electronically Signed   By: Ulyses Jarred M.D.   On: 12/03/2017 05:03   Ct Angio Neck W Or Wo Contrast  Result Date: 11/26/2017 CLINICAL DATA:  Follow-up code stroke. History of stroke, substance abuse, hypertension, hyperlipidemia. EXAM: CT ANGIOGRAPHY HEAD AND NECK CT PERFUSION BRAIN TECHNIQUE: Multidetector CT imaging of the head and neck was performed using the standard protocol during bolus administration of intravenous contrast. Multiplanar CT image reconstructions and MIPs were obtained to evaluate the vascular anatomy. Carotid stenosis measurements (when applicable) are obtained utilizing NASCET criteria, using the distal internal carotid diameter as the denominator. Multiphase CT imaging of the brain was performed following IV bolus contrast injection. Subsequent parametric perfusion maps were calculated using RAPID software. CONTRAST:  144mL ISOVUE-370 IOPAMIDOL (ISOVUE-370) INJECTION 76% COMPARISON:  CT HEAD November 25, 2017 at 11:11 p.m. MRA head and neck January 07, 2007 FINDINGS: CTA NECK FINDINGS: AORTIC ARCH: 3.6 cm ascending aorta, normal branch pattern. Mild intimal thickening aortic arch. The origins of the innominate, left Common carotid artery and subclavian artery are widely patent. Mild stenosis mid LEFT subclavian artery. RIGHT CAROTID SYSTEM: Common carotid artery is widely patent, coursing  in a  straight line fashion. Normal appearance of the carotid bifurcation without hemodynamically significant stenosis by NASCET criteria. Tortuous course associated with hypertension. LEFT CAROTID SYSTEM: Common carotid artery is widely patent, coursing in a straight line fashion. Normal appearance of the carotid bifurcation without hemodynamically significant stenosis by NASCET criteria. Tortuous course associated with hypertension, widely patent; mild luminal irregularity most compatible with atherosclerosis. VERTEBRAL ARTERIES:New, age indeterminate LEFT V1 non flow limiting dissection flap without pseudo aneurysm. Mild LEFT vertebral artery luminal irregularity. Mild calcific atherosclerosis RIGHT vertebral artery origin. Bilateral vertebral arteries are patent. SKELETON: No acute osseous process though bone windows have not been submitted. Poor dentition. OTHER NECK: Soft tissues of the neck are nonacute though, not tailored for evaluation. UPPER CHEST: Included lung apices are clear. No superior mediastinal lymphadenopathy. CTA HEAD FINDINGS: ANTERIOR CIRCULATION: Patent cervical internal carotid arteries, petrous, cavernous and supra clinoid internal carotid arteries. Calcific atherosclerosis resulting in moderate to severe stenosis RIGHT para clinoid internal carotid artery. Bilateral 2 mm paraophthalmic outpouchings (axial 103/352). Patent anterior communicating artery. Patent anterior and middle cerebral arteries. Moderate tandem stenoses bilateral anterior cerebral arteries. Moderate stenosis RIGHT M1 origin. Moderate to severe stenosis distal RIGHT M2. Focally occluded versus severe stenosis proximal RIGHT M3 segment. Severe stenosis LEFT M2 inferior division origin. No large vessel occlusion, significant stenosis, contrast extravasation or aneurysm. POSTERIOR CIRCULATION: Patent vertebral arteries, vertebrobasilar junction and basilar artery, as well as main branch vessels with mild luminal  irregularity. Moderate stenosis RIGHT V4 segment with mild poststenotic dilatation. Patent posterior cerebral arteries. Moderate to severe tandem stenoses RIGHT posterior cerebral artery. Moderate tandem stenosis LEFT posterior cerebral artery. No large vessel occlusion, significant stenosis, contrast extravasation or aneurysm. VENOUS SINUSES: Major dural venous sinuses are patent though not tailored for evaluation on this angiographic examination. ANATOMIC VARIANTS: None. DELAYED PHASE: Not performed. MIP images reviewed. CT Brain Perfusion Findings: CBF (<30%) Volume: 52mL Perfusion (Tmax>6.0s) volume: 83mL Mismatch Volume: 6mL Infarction Location:Perfusion mismatch bifrontal lobes, likely artifact. Prolonged mean transit time RIGHT basal ganglia and to lesser extent RIGHT MCA territory. IMPRESSION: CTA NECK: 1. No hemodynamically significant stenosis or acute vascular process in the neck. 2. 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 No pseudoaneurysm. 3. **An incidental finding of potential clinical significance has been found. Ectatic 3.6 cm ascending aorta. Recommend annual imaging followup by CTA or MRA. This recommendation follows 2010 ACCF/AHA/AATS/ACR/ASA/SCA/SCAI/SIR/STS/SVM Guidelines for the Diagnosis and Management of Patients with Thoracic Aortic Disease. Circulation.2010; 121: Z610-R604** CTA HEAD: 1. No emergent large vessel occlusion. Focally occluded RIGHT M2/M3 segment. 2. Advanced intracranial atherosclerosis with multifocal high-grade stenosis including severe stenosis LEFT M2 segment, moderate to severe stenoses RIGHT middle cerebral artery and LEFT posterior cerebral artery. 3. 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 CT PERFUSION: 1. No acute perfusion defect. 2. Prolonged mean transit time RIGHT MCA consistent with stenoses. Acute findings discussed with and reconfirmed by Dr.ASHISH ARORA on 11/25/2017 at 12:00 am. Electronically  Signed   By: Elon Alas M.D.   On: 11/26/2017 00:03   Mr Brain Wo Contrast  Result Date: 11/27/2017 CLINICAL DATA:  57 year old male status post code stroke presentation. Prior infarcts. Left vertebral artery dissection, intracranial atherosclerosis, and occluded right M2/M3 MCA segment on CTA head and neck 2 days ago. EXAM: MRI HEAD WITHOUT CONTRAST TECHNIQUE: Multiplanar, multiecho pulse sequences of the brain and surrounding structures were obtained without intravenous contrast. COMPARISON:  CTA head and neck and CT brain perfusion 11/25/2017. Brain MRI 06/08/2013 FINDINGS: Brain: Small areas of  restricted diffusion in the left corona radiata both anteriorly (series 4, image 32), and more pronounced posteriorly with some extension toward the left deep white matter capsules and lentiform. Associated T2 and FLAIR hyperintensity with no acute hemorrhage or mass effect. No other restricted diffusion but extensive chronic ischemic changes about the bilateral deep gray matter nuclei with chronic hemosiderin. Occasional other scattered chronic micro hemorrhages in the brain. Chronically advanced T2 and FLAIR hyperintensity in the cerebral white matter (greater in the right hemisphere) and also the pons. Chronic right side Wallerian degeneration is evident in the brainstem (series 7, image 9). The cerebellum appears to remain normal. No discrete cerebral cortical encephalomalacia is identified. Stable mild ex vacuo lateral ventricle enlargement. Stable cerebral volume. No midline shift, mass effect, evidence of mass lesion, extra-axial collection or acute intracranial hemorrhage. Cervicomedullary junction and pituitary are within normal limits. Vascular: Major intracranial vascular flow voids are stable compared to 2,014. Skull and upper cervical spine: Negative visualized cervical spine. Normal bone marrow signal. Sinuses/Orbits: Normal orbits soft tissues. Paranasal sinus mucosal thickening. Mastoid air  cells remain clear. Other: Intubated. Fluid in the pharynx. Right side nasoenteric tube in place. Visible internal auditory structures appear normal. Scalp and face soft tissues appear negative. IMPRESSION: 1. Small acute infarcts scattered in the left corona radiata, some tracking toward the posterior left lentiform. No associated acute hemorrhage or mass effect. 2. Underlying severe chronic ischemic disease in the bilateral deep gray matter nuclei with advanced chronic signal changes in the cerebral white matter, and brainstem Wallerian degeneration. 3. Intubated. Right nasoenteric tube in place. Fluid in the pharynx. Electronically Signed   By: Genevie Ann M.D.   On: 11/27/2017 11:15   Ct Cerebral Perfusion W Contrast  Result Date: 11/26/2017 CLINICAL DATA:  Follow-up code stroke. History of stroke, substance abuse, hypertension, hyperlipidemia. EXAM: CT ANGIOGRAPHY HEAD AND NECK CT PERFUSION BRAIN TECHNIQUE: Multidetector CT imaging of the head and neck was performed using the standard protocol during bolus administration of intravenous contrast. Multiplanar CT image reconstructions and MIPs were obtained to evaluate the vascular anatomy. Carotid stenosis measurements (when applicable) are obtained utilizing NASCET criteria, using the distal internal carotid diameter as the denominator. Multiphase CT imaging of the brain was performed following IV bolus contrast injection. Subsequent parametric perfusion maps were calculated using RAPID software. CONTRAST:  124mL ISOVUE-370 IOPAMIDOL (ISOVUE-370) INJECTION 76% COMPARISON:  CT HEAD November 25, 2017 at 11:11 p.m. MRA head and neck January 07, 2007 FINDINGS: CTA NECK FINDINGS: AORTIC ARCH: 3.6 cm ascending aorta, normal branch pattern. Mild intimal thickening aortic arch. The origins of the innominate, left Common carotid artery and subclavian artery are widely patent. Mild stenosis mid LEFT subclavian artery. RIGHT CAROTID SYSTEM: Common carotid artery is widely  patent, coursing in a straight line fashion. Normal appearance of the carotid bifurcation without hemodynamically significant stenosis by NASCET criteria. Tortuous course associated with hypertension. LEFT CAROTID SYSTEM: Common carotid artery is widely patent, coursing in a straight line fashion. Normal appearance of the carotid bifurcation without hemodynamically significant stenosis by NASCET criteria. Tortuous course associated with hypertension, widely patent; mild luminal irregularity most compatible with atherosclerosis. VERTEBRAL ARTERIES:New, age indeterminate LEFT V1 non flow limiting dissection flap without pseudo aneurysm. Mild LEFT vertebral artery luminal irregularity. Mild calcific atherosclerosis RIGHT vertebral artery origin. Bilateral vertebral arteries are patent. SKELETON: No acute osseous process though bone windows have not been submitted. Poor dentition. OTHER NECK: Soft tissues of the neck are nonacute though, not tailored for evaluation. UPPER CHEST:  Included lung apices are clear. No superior mediastinal lymphadenopathy. CTA HEAD FINDINGS: ANTERIOR CIRCULATION: Patent cervical internal carotid arteries, petrous, cavernous and supra clinoid internal carotid arteries. Calcific atherosclerosis resulting in moderate to severe stenosis RIGHT para clinoid internal carotid artery. Bilateral 2 mm paraophthalmic outpouchings (axial 103/352). Patent anterior communicating artery. Patent anterior and middle cerebral arteries. Moderate tandem stenoses bilateral anterior cerebral arteries. Moderate stenosis RIGHT M1 origin. Moderate to severe stenosis distal RIGHT M2. Focally occluded versus severe stenosis proximal RIGHT M3 segment. Severe stenosis LEFT M2 inferior division origin. No large vessel occlusion, significant stenosis, contrast extravasation or aneurysm. POSTERIOR CIRCULATION: Patent vertebral arteries, vertebrobasilar junction and basilar artery, as well as main branch vessels with mild  luminal irregularity. Moderate stenosis RIGHT V4 segment with mild poststenotic dilatation. Patent posterior cerebral arteries. Moderate to severe tandem stenoses RIGHT posterior cerebral artery. Moderate tandem stenosis LEFT posterior cerebral artery. No large vessel occlusion, significant stenosis, contrast extravasation or aneurysm. VENOUS SINUSES: Major dural venous sinuses are patent though not tailored for evaluation on this angiographic examination. ANATOMIC VARIANTS: None. DELAYED PHASE: Not performed. MIP images reviewed. CT Brain Perfusion Findings: CBF (<30%) Volume: 30mL Perfusion (Tmax>6.0s) volume: 37mL Mismatch Volume: 34mL Infarction Location:Perfusion mismatch bifrontal lobes, likely artifact. Prolonged mean transit time RIGHT basal ganglia and to lesser extent RIGHT MCA territory. IMPRESSION: CTA NECK: 1. No hemodynamically significant stenosis or acute vascular process in the neck. 2. 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 No pseudoaneurysm. 3. **An incidental finding of potential clinical significance has been found. Ectatic 3.6 cm ascending aorta. Recommend annual imaging followup by CTA or MRA. This recommendation follows 2010 ACCF/AHA/AATS/ACR/ASA/SCA/SCAI/SIR/STS/SVM Guidelines for the Diagnosis and Management of Patients with Thoracic Aortic Disease. Circulation.2010; 121: T557-D220** CTA HEAD: 1. No emergent large vessel occlusion. Focally occluded RIGHT M2/M3 segment. 2. Advanced intracranial atherosclerosis with multifocal high-grade stenosis including severe stenosis LEFT M2 segment, moderate to severe stenoses RIGHT middle cerebral artery and LEFT posterior cerebral artery. 3. 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 CT PERFUSION: 1. No acute perfusion defect. 2. Prolonged mean transit time RIGHT MCA consistent with stenoses. Acute findings discussed with and reconfirmed by Dr.ASHISH ARORA on 11/25/2017 at 12:00 am.  Electronically Signed   By: Elon Alas M.D.   On: 11/26/2017 00:03   Dg Chest Port 1 View  Result Date: 12/12/2017 CLINICAL DATA:  Fever. EXAM: PORTABLE CHEST 1 VIEW COMPARISON:  Radiograph of December 10, 2017. FINDINGS: Stable cardiomegaly. Tracheostomy tube is in grossly good position. No pneumothorax or pleural effusion is noted. Right sided PICC line is noted with distal tip in expected position of cavoatrial junction. No acute pulmonary disease is noted. Bony thorax is unremarkable. IMPRESSION: Stable support apparatus. No acute cardiopulmonary abnormality seen. Electronically Signed   By: Marijo Conception, M.D.   On: 12/12/2017 13:25   Dg Chest Port 1 View  Result Date: 12/10/2017 CLINICAL DATA:  Acute respiratory failure with hypoxia. EXAM: PORTABLE CHEST 1 VIEW COMPARISON:  12/06/2017 FINDINGS: Tracheostomy remains in place. Right arm PICC tip in the proximal right atrium. Mild cardiomegaly and aortic atherosclerosis as seen previously. Worsened patchy density at the right lung base consistent with atelectasis or mild pneumonia. Left lung remains clear. IMPRESSION: Development of right base patchy density which could be atelectasis or mild pneumonia. Electronically Signed   By: Nelson Chimes M.D.   On: 12/10/2017 09:17   Dg Chest Port 1 View  Result Date: 12/06/2017 CLINICAL DATA:  Assess tracheostomy tube position. EXAM:  PORTABLE CHEST 1 VIEW COMPARISON:  Chest x-ray from same day at 0600. FINDINGS: Interval placement of a tracheostomy tube with the tip in good position, approximately 5.2 cm above the level of the carina. Interval removal of the enteric tube. Interval placement of a right sided PICC line with the tip at the cavoatrial junction. Stable mild cardiomegaly. Normal pulmonary vascularity. No focal consolidation, pleural effusion, or pneumothorax. No acute osseous abnormality. IMPRESSION: 1. Appropriately positioned tracheostomy tube. 2. Interval placement of a right sided PICC  line with the tip at the cavoatrial junction. No pneumothorax. Electronically Signed   By: Titus Dubin M.D.   On: 12/06/2017 16:53   Dg Chest Port 1 View  Result Date: 12/06/2017 CLINICAL DATA:  Fevers EXAM: PORTABLE CHEST 1 VIEW COMPARISON:  12/03/2017 FINDINGS: Cardiac shadow is enlarged but stable. Endotracheal tube and nasogastric catheter are again noted and stable. The lungs are well aerated bilaterally. No focal infiltrate or sizable effusion is seen. No acute bony abnormality is noted. IMPRESSION: No acute abnormality noted. Electronically Signed   By: Inez Catalina M.D.   On: 12/06/2017 07:58   Dg Chest Port 1 View  Result Date: 12/03/2017 CLINICAL DATA:  57 year old male with history of aspiration. EXAM: PORTABLE CHEST 1 VIEW COMPARISON:  Chest x-ray 12/02/2017. FINDINGS: An endotracheal tube is in place with tip 3.2 cm above the carina. A nasogastric tube is seen extending into the stomach, however, the tip of the nasogastric tube extends below the lower margin of the image. Lung volumes are slightly low. Improving aeration in the left lung base, suggesting resolving areas of atelectasis and/or consolidation. Right lung is clear. No pleural effusions. No evidence of pulmonary edema. Heart size is mildly enlarged. Upper mediastinal contours are unremarkable. IMPRESSION: 1. Support apparatus, as above. 2. Improving aeration in the left lung base, likely to reflect areas of resolving atelectasis and/or consolidation. 3. Mild cardiomegaly. Electronically Signed   By: Vinnie Langton M.D.   On: 12/03/2017 07:32   Dg Chest Port 1 View  Result Date: 12/02/2017 CLINICAL DATA:  57 year old male status post intubation. EXAM: PORTABLE CHEST 1 VIEW COMPARISON:  Chest radiograph dated 11/28/2017 FINDINGS: Endotracheal tube approximately 3.4 cm above the carina. An enteric tube extends into the upper abdomen with tip likely in the distal stomach. There is mild cardiomegaly. Mild perivascular hazy  densities may represent atelectatic changes or mild vascular congestion. An area of increased density at the left lung base may represent atelectatic changes. Pneumonia is not excluded. Clinical correlation is recommended. There is no large pleural effusion. No pneumothorax. No acute osseous pathology. IMPRESSION: 1. Endotracheal tube above the carina and enteric tube likely in the distal stomach. 2. Mild cardiomegaly. 3. Bilateral infrahilar probable atelectatic changes. Left lung base infiltrate is not entirely excluded. Clinical correlation is recommended. Electronically Signed   By: Anner Crete M.D.   On: 12/02/2017 01:14   Dg Chest Port 1 View  Result Date: 11/28/2017 CLINICAL DATA:  Intubation. EXAM: PORTABLE CHEST 1 VIEW COMPARISON:  11/27/2017. FINDINGS: Endotracheal tube and NG tube in stable position. Stable cardiomegaly. No pulmonary venous congestion. Bibasilar atelectasis unchanged. No pleural effusion or pneumothorax. IMPRESSION: 1.  Lines and tubes in stable position. 2.  Stable cardiomegaly. 3. Bibasilar atelectasis again noted. No significant interim change. Electronically Signed   By: Marcello Moores  Register   On: 11/28/2017 07:50   Dg Chest Port 1 View  Result Date: 11/27/2017 CLINICAL DATA:  CVA, intubated patient, EXAM: PORTABLE CHEST 1 VIEW COMPARISON:  Chest x-ray of November 26, 2017 FINDINGS: The lungs are better inflated today. There is linear increased density overlying the left heart border inferiorly. The cardiac silhouette is enlarged. The pulmonary vascularity is normal. There is no pleural effusion or pneumothorax. The endotracheal tube tip projects 2.7 cm above the carina. The esophagogastric tube tip and proximal port project below the GE junction. IMPRESSION: Improved aeration of both lungs. Subsegmental atelectasis at the left lung base. The support tubes are in reasonable position. Electronically Signed   By: David  Martinique M.D.   On: 11/27/2017 07:31   Dg Chest Port 1  View  Result Date: 11/26/2017 CLINICAL DATA:  Intubation. EXAM: PORTABLE CHEST 1 VIEW COMPARISON:  None. FINDINGS: The endotracheal tube is 3 cm above the carina. There is an NG tube coursing down the esophagus and into the stomach. The heart is mildly enlarged and the mediastinal and hilar contours are slightly prominent but probably within normal limits given the AP projection and portable technique. Low lung volumes with vascular crowding and streaky atelectasis. No definite infiltrates or effusions. The bony thorax is intact. IMPRESSION: Support apparatus in good position as discussed above. Low lung volumes with vascular crowding and atelectasis. Electronically Signed   By: Marijo Sanes M.D.   On: 11/26/2017 20:43   Dg Abd Portable 1v  Result Date: 11/26/2017 CLINICAL DATA:  Nasogastric tube placement. EXAM: PORTABLE ABDOMEN - 1 VIEW COMPARISON:  None. FINDINGS: Nasogastric tube coiled in the left upper abdomen with its tip in the proximal to mid stomach and side hole in the proximal stomach. The included portion of the abdomen demonstrates a normal bowel gas pattern. Enlarged cardiac silhouette with a tortuous aorta. Clear lungs with mildly prominent interstitial markings. Thoracic spine degenerative changes and mild scoliosis. IMPRESSION: 1. Nasogastric tube tip and side hole in the stomach. 2. Cardiomegaly and mild chronic interstitial lung disease. Electronically Signed   By: Claudie Revering M.D.   On: 11/26/2017 14:42   Ct Head Code Stroke Wo Contrast  Result Date: 11/25/2017 CLINICAL DATA:  Code stroke. TIA. Initial evaluation. History of substance abuse, hypertension, hyperlipidemia, intracranial hemorrhage. EXAM: CT HEAD WITHOUT CONTRAST TECHNIQUE: Contiguous axial images were obtained from the base of the skull through the vertex without intravenous contrast. COMPARISON:  MRI of the head June 08, 2013 FINDINGS: BRAIN: No intraparenchymal hemorrhage, mass effect nor midline shift. Age  indeterminate LEFT corona radiata to the basal ganglia infarct. Old bilateral basal ganglia infarcts. Ex vacuo dilatation RIGHT greater than LEFT lateral ventricles. 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 Mild global parenchymal brain volume loss for age without hydrocephalus. Confluent supratentorial white matter hypodensities. No acute large vascular territory infarct. No abnormal extra-axial fluid collections. Basal cisterns are patent. VASCULAR: Mildly dense cerebral artery's compatible with hemoconcentration. Mild calcific atherosclerosis carotid siphon and included vertebral artery's. SKULL/SOFT TISSUES: No skull fracture. No significant soft tissue swelling. ORBITS/SINUSES: The included ocular globes and orbital contents are normal.Mild paranasal sinus mucosal thickening without air-fluid levels. Mastoid air cells are well aerated. OTHER: None. ASPECTS [facility] Stroke Program Early CT Score) - Ganglionic level infarction (caudate, lentiform nuclei, internal capsule, insula, M1-M3 cortex): 6 - Supraganglionic infarction (M4-M6 cortex): 3 Total score (0-10 with 10 being normal): 10 IMPRESSION: 1. Potentially acute LEFT corona radiata/basal ganglia infarct. 2. ASPECTS is 9. 3. Old bilateral basal ganglia infarcts. 4. Moderate to severe white matter changes compatible chronic small vessel ischemic disease. 5. Mild global parenchymal brain volume loss for age. Critical Value/emergent results  were called by telephone at the time of interpretation on 11/25/2017 at 11:20 pm to Dr. Rory Percy, Neurology, who verbally acknowledged these results. Electronically Signed   By: Elon Alas M.D.   On: 11/25/2017 23:21      Subjective: Patient continue to be aphasic, not in distress, tolerating tube feedings and managing secretions well.   Discharge Exam: Vitals:   12/22/17 1237 12/22/17 1336  BP:  (!) 126/93  Pulse: (!) 111 (!) 104  Resp: (!) 21 20  Temp:  99.1 F  (37.3 C)  SpO2: 100% 99%   Vitals:   12/22/17 0956 12/22/17 1235 12/22/17 1237 12/22/17 1336  BP: (!) 133/105 (!) 146/97  (!) 126/93  Pulse: (!) 109 (!) 113 (!) 111 (!) 104  Resp:  20 (!) 21 20  Temp:    99.1 F (37.3 C)  TempSrc:    Oral  SpO2:  98% 100% 99%  Weight:      Height:        General: Not in pain or dyspnea, deconditioned Neurology: aphasia, left hemiparesis.  E ENT: no pallor, no icterus, oral mucosa moist/ trach in place.  Cardiovascular: No JVD. S1-S2 present, rhythmic, no gallops, rubs, or murmurs. No lower extremity edema. Pulmonary: vesicular breath sounds bilaterally, adequate air movement, no wheezing, rhonchi or rales. Gastrointestinal. Abdomen flat, no organomegaly, non tender, no rebound or guarding. PEG in place.  Skin. No rashes Musculoskeletal: no joint deformities   The results of significant diagnostics from this hospitalization (including imaging, microbiology, ancillary and laboratory) are listed below for reference.     Microbiology: No results found for this or any previous visit (from the past 240 hour(s)).   Labs: BNP (last 3 results) No results for input(s): BNP in the last 8760 hours. Basic Metabolic Panel: Recent Labs  Lab 12/16/17 0440 12/18/17 0416 12/18/17 1709 12/19/17 1151 12/21/17 0351  NA 148* 149* 150* 148* 143  K 3.7 4.1 4.0 4.2 3.6  CL 111 110 114* 110 106  CO2 28 27 27 25 24   GLUCOSE 146* 119* 142* 141* 141*  BUN 35* 31* 30* 30* 21*  CREATININE 1.63* 1.49* 1.48* 1.45* 1.20  CALCIUM 9.0 9.2 8.9 8.9 8.6*   Liver Function Tests: No results for input(s): AST, ALT, ALKPHOS, BILITOT, PROT, ALBUMIN in the last 168 hours. No results for input(s): LIPASE, AMYLASE in the last 168 hours. No results for input(s): AMMONIA in the last 168 hours. CBC: No results for input(s): WBC, NEUTROABS, HGB, HCT, MCV, PLT in the last 168 hours. Cardiac Enzymes: No results for input(s): CKTOTAL, CKMB, CKMBINDEX, TROPONINI in the last 168  hours. BNP: Invalid input(s): POCBNP CBG: Recent Labs  Lab 12/21/17 1946 12/22/17 0013 12/22/17 0546 12/22/17 0755 12/22/17 1155  GLUCAP 108* 126* 128* 109* 117*   D-Dimer No results for input(s): DDIMER in the last 72 hours. Hgb A1c No results for input(s): HGBA1C in the last 72 hours. Lipid Profile No results for input(s): CHOL, HDL, LDLCALC, TRIG, CHOLHDL, LDLDIRECT in the last 72 hours. Thyroid function studies No results for input(s): TSH, T4TOTAL, T3FREE, THYROIDAB in the last 72 hours.  Invalid input(s): FREET3 Anemia work up No results for input(s): VITAMINB12, FOLATE, FERRITIN, TIBC, IRON, RETICCTPCT in the last 72 hours. Urinalysis    Component Value Date/Time   COLORURINE YELLOW 12/10/2017 [place] 12/10/2017 1207   LABSPEC 1.016 12/10/2017 1207   PHURINE 6.0 12/10/2017 [place] 12/10/2017 [place] 12/10/2017 1207  BILIRUBINUR NEGATIVE 12/10/2017 [place] 12/10/2017 1207   PROTEINUR NEGATIVE 12/10/2017 1207   UROBILINOGEN 0.2 06/07/2013 1844   NITRITE NEGATIVE 12/10/2017 1207   LEUKOCYTESUR NEGATIVE 12/10/2017 1207   Sepsis Labs Invalid input(s): PROCALCITONIN,  WBC,  LACTICIDVEN Microbiology No results found for this or any previous visit (from the past 240 hour(s)).   Time coordinating discharge: 45 minutes  SIGNED:   Alma Friendly, MD  [facility] 12/22/2017, 2:35 PM  If 7PM-7AM, please contact night-coverage [email or web address] Password [email or web address]

## 2017-12-22 NOTE — Progress Notes (Signed)
[name] to be D/C'd to [facility] per MD order. Report called to [place] at [facility].  Allergies as of 12/22/2017   No Known Allergies     Medication List    STOP taking these medications   hydrochlorothiazide 25 MG tablet Commonly known as:  HYDRODIURIL     TAKE these medications   acetaminophen 325 MG tablet Commonly known as:  TYLENOL Take 2 tablets (650 mg total) by mouth every 4 (four) hours as needed for mild pain (100.4 F).   albuterol (2.5 MG/3ML) 0.083% nebulizer solution Commonly known as:  PROVENTIL Take 3 mLs (2.5 mg total) by nebulization every 2 (two) hours as needed for wheezing.   amLODipine 10 MG tablet Commonly known as:  NORVASC Take 1 tablet (10 mg total) by mouth every morning.   aspirin 81 MG tablet Take 1 tablet (81 mg total) by mouth daily.   atorvastatin 80 MG tablet Commonly known as:  LIPITOR Place 1 tablet (80 mg total) into feeding tube daily at 6 PM. What changed:    medication strength  how much to take  how to take this  when to take this   Baclofen 5 MG Tabs Place 5 mg into feeding tube 2 (two) times daily.   carvedilol 3.125 MG tablet Commonly known as:  COREG Place 1 tablet (3.125 mg total) into feeding tube 2 (two) times daily with a meal.   clopidogrel 75 MG tablet Commonly known as:  PLAVIX Place 1 tablet (75 mg total) into feeding tube daily.   docusate 50 MG/5ML liquid Commonly known as:  COLACE Place 10 mLs (100 mg total) into feeding tube 2 (two) times daily.   feeding supplement (JEVITY 1.2 CAL) Liqd Place 1,000 mLs into feeding tube continuous.   feeding supplement (PRO-STAT SUGAR FREE 64) Liqd Place 30 mLs into feeding tube daily.   free water Soln Place 300 mLs into feeding tube 4 (four) times daily.   HM MULTIVITAMIN ADULT GUMMY PO Take 1 tablet by mouth every morning.   ibuprofen 100 MG/5ML suspension Commonly known as:  ADVIL,MOTRIN Place 20 mLs (400 mg total) into feeding  tube every 6 (six) hours as needed for fever.   insulin aspart 100 UNIT/ML injection Commonly known as:  novoLOG Inject 0-9 Units into the skin every 4 (four) hours. For glucose 150-200 use 2 units, for 201-250 use 4 units, for 251 to 300 use 6 units, for 301 to 350 use 8 units, for 351 or greater use 10 units.   lisinopril 20 MG tablet Commonly known as:  PRINIVIL,ZESTRIL Take 1 tablet (20 mg total) by mouth daily.       VVS, Skin clean, dry and intact without evidence of skin break down, no evidence of skin tears noted. Patient suctioned prior to transport. An After Visit Summary was printed and given to the patient's wife.  Patient escorted via stretcher, and transported to [facility] via [place].   [name]  12/22/2017 4:22 PM

## 2017-12-22 NOTE — Progress Notes (Signed)
Patient will DC to: [facility] Anticipated DC date: 12/22/17 Family notified: Spouse Transport by: PTAr 3:30pm   Per MD patient ready for DC to [facility]. RN, patient, patient's family, and facility notified of DC. Discharge Summary sent to facility. RN given number for report. DC packet on chart. Ambulance transport requested for patient.   CSW signing off.  Cedric Fishman, [place] Social Worker [phone]

## 2017-12-22 NOTE — Clinical Social Work Placement (Signed)
   CLINICAL SOCIAL WORK PLACEMENT  NOTE  Date:  12/22/2017  Patient Details  Name: [name] MRN: [id number] Date of Birth: 1960/12/13  Clinical Social Work is seeking post-discharge placement for this patient at the [place] level of care (*CSW will initial, date and re-position this form in  chart as items are completed):  Yes   Patient/family provided with [place] Work Department's list of facilities offering this level of care within the geographic area requested by the patient (or if unable, by the patient's family).  Yes   Patient/family informed of their freedom to choose among providers that offer the needed level of care, that participate in Medicare, Medicaid or managed care program needed by the patient, have an available bed and are willing to accept the patient.  Yes   Patient/family informed of [place]'s ownership interest in [facility] and [facility], as well as of the fact that they are under no obligation to receive care at these facilities.  PASRR submitted to EDS on 12/13/17     PASRR number received on 12/13/17     Existing PASRR number confirmed on       FL2 transmitted to all facilities in geographic area requested by pt/family on 12/13/17     FL2 transmitted to all facilities within larger geographic area on       Patient informed that his/her managed care company has contracts with or will negotiate with certain facilities, including the following:        Yes   Patient/family informed of bed offers received.  Patient chooses bed at [facility]     Physician recommends and patient chooses bed at      Patient to be transferred to [facility] on 12/22/17.  Patient to be transferred to facility by [facility]     Patient family notified on 12/22/17 of transfer.  Name of family member notified:  Spouse     PHYSICIAN       Additional Comment:     _______________________________________________ Benard Halsted, [place] 12/22/2017, 2:54 PM

## 2017-12-22 NOTE — Progress Notes (Signed)
RN called for pt sweating, rectal temp 100.3, BP 170/100, HR 140-150's, RR 30-40's. I was unable to come at that time. Advised to page on call MD and I would come to bedside as soon as possible. On my arrival [facility] NP at bedside, new orders placed and carried out by [facility]. No interventions from RRT. Advised to call as needed.

## 2017-12-22 NOTE — Progress Notes (Signed)
At shift change, pt's BP and HR were elevated. As the shift progressed, pt was sweating profusely. Pt was afebrile 98.3 orally and 100.3 rectally. BP and HR were not responding to PRN BP meds. BP was 170s/120s and above and HR was sustained in 140s to 150s. Respirations wee in the 30s to 40s. On call provider, RRT and Respiratory were called to assess the pt. Pt was given Labetalol, Toradol and all scheduled BP meds and night meds. Pt's BP came down to 141/81 and HR to the low 100s-110s. Resp went back to the low 20s. Pt was able to rest and is currently resting. Will continue to monitor.

## 2017-12-23 ENCOUNTER — Emergency Department (HOSPITAL_COMMUNITY)
Admission: EM | Admit: 2017-12-23 | Discharge: 2018-01-12 | Disposition: E | Payer: Medicare HMO | Attending: Emergency Medicine | Admitting: Emergency Medicine

## 2017-12-23 ENCOUNTER — Encounter (HOSPITAL_COMMUNITY): Payer: Self-pay

## 2017-12-23 DIAGNOSIS — I469 Cardiac arrest, cause unspecified: Secondary | ICD-10-CM | POA: Diagnosis not present

## 2017-12-23 DIAGNOSIS — Z930 Tracheostomy status: Secondary | ICD-10-CM | POA: Insufficient documentation

## 2017-12-23 DIAGNOSIS — Z8546 Personal history of malignant neoplasm of prostate: Secondary | ICD-10-CM | POA: Diagnosis not present

## 2017-12-23 DIAGNOSIS — Z931 Gastrostomy status: Secondary | ICD-10-CM | POA: Insufficient documentation

## 2017-12-23 DIAGNOSIS — I10 Essential (primary) hypertension: Secondary | ICD-10-CM | POA: Insufficient documentation

## 2017-12-23 MED ORDER — SODIUM BICARBONATE 8.4 % IV SOLN
INTRAVENOUS | Status: AC
  Filled 2017-12-23: qty 50

## 2017-12-23 MED ORDER — SODIUM BICARBONATE 8.4 % IV SOLN
INTRAVENOUS | Status: AC | PRN
  Administered 2017-12-23: 100 meq via INTRAVENOUS

## 2017-12-23 MED ORDER — EPINEPHRINE PF 1 MG/10ML IJ SOSY
PREFILLED_SYRINGE | INTRAMUSCULAR | Status: AC | PRN
  Administered 2017-12-23 (×2): 1 via INTRAVENOUS

## 2017-12-23 MED ORDER — SODIUM BICARBONATE 8.4 % IV SOLN: 50.0000 meq | Freq: Once | INTRAVENOUS | Status: DC

## 2017-12-26 ENCOUNTER — Encounter: Payer: Medicare Other | Admitting: Internal Medicine

## 2017-12-26 MED FILL — Medication: Qty: 1 | Status: AC

## 2018-01-12 NOTE — Progress Notes (Signed)
Pt to Trauma B as a CPR in progress. Pt trach had been removed and 7.0 ett was secured in stoma. Pt manually ventilated by this RT throughout duration of CPR/Code with no difficulty. Bilateral clear BS throughout. Pt expired. ETT left in place in stoma per ED MD.

## 2018-01-12 NOTE — ED Notes (Signed)
Trach removed by ems. Patient intubated in this area. Trach tube removed, IO removed and IV removed

## 2018-01-12 NOTE — Progress Notes (Signed)
Supporting this family who have just learned the news of their loved one dying.  Lots of shock with the spouse and sons.  We are waiting on one additional son to arrive and I will accompany them back to see the deceased.    Jan 07, 2018 1217  Clinical Encounter Type  Visited With Family;Health care provider  Visit Type Follow-up;Psychological support;Spiritual support;Death;ED

## 2018-01-12 NOTE — ED Provider Notes (Signed)
Emergency Department Provider Note   I have reviewed the triage vital signs and the nursing notes.   HISTORY  Chief Complaint Cardiac Arrest   HPI [name] is a 57 y.o. 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  According to EMS the patient had a witnessed arrest in front of staff immediately started CPR.  They describe significant amount of frothing secretions from the patient's tracheostomy.  The paramedics continued CPR and ACLS protocol giving approximately 10 epinephrine injections.  They were having difficulty ventilating through the patient's existing trach so this was removed and a 7.5 ET tube was placed in the stoma with improved ventilation.  They report ROSC on scene twice but had been doing CPR in PEA arrest for the 20 minutes prior to ED arrival.   Level 5 caveat: Cardiac Arrest   Past Medical History:  Diagnosis Date  . Arthritis   . Chest pain    with a negative adenosine cardiolite in December 2006. Dr. Liana Crocker   . 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 Diastolic dysfunction    impaired L ventricle relatation with an EF of 65% and echo in December 2005  . Erectile dysfunction   . Hyperlipidemia    elevated transaminases on statin?-40's (2/06) - Hep B/C negative 4/06; resolution on 4/06 labs; off statin in 2006  . Hypertension    concentric LVH 2d echo 12/05; negative proteinuria 11/00  . 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 Onychomycosis April 2003   right big toe  . Prostate disorder    prostate irregularity with  obstructive voiding symptoms (PSA 0.67); -likely related to overstimulation of alpha receptors at bladder neck; seen by Dr. Reece Agar in 9/05. no BPH at that visit  . Tinea versicolor    dermatology referral; secondary to no treatement response to selenium sulfide; currently resolive    Patient Active Problem List   Diagnosis Date Noted  . Aspiration into airway   . Hypoxemia   . Tracheostomy status ([place])   . Acute respiratory failure with hypoxia ([place])   . Postextubation stridor   . Encounter for nasogastric (NG) tube placement   . Acute kidney injury ([place]) 11/28/2017  . Atelectasis, bilateral 11/28/2017  . Aspiration pneumonia ([place]) 11/27/2017  . Endotracheally intubated 11/27/2017  . Aphasia 11/26/2017  . Dysarthria   . Oropharyngeal dysphagia   . Bulbar palsy ([place])   . Stroke ([place]) 11/25/2017  . Erectile dysfunction 10/04/2016  . Exertional chest pain 07/01/2015  . Low back pain radiating to both legs 02/13/2015  . Microcytic anemia 01/08/2013  . Osteoarthritis 03/27/2012  . BPH (benign prostatic hyperplasia) 06/06/2011  . Preventative health care 06/06/2011  . History of completed stroke 05/06/2011  . DRUG ABUSE, HX OF 09/19/2006  . Hyperlipidemia 09/18/2006  . Essential hypertension, benign 09/18/2006    Past Surgical History:  Procedure Laterality Date  . COLONOSCOPY WITH PROPOFOL N/A 05/22/2013   Procedure: COLONOSCOPY WITH PROPOFOL;  Surgeon: Arta Silence, MD;  Location: [facility];  Service: Endoscopy;  Laterality: N/A;  . ESOPHAGOGASTRODUODENOSCOPY N/A 12/07/2017  Procedure: ESOPHAGOGASTRODUODENOSCOPY (EGD);  Surgeon: Georganna Skeans, MD;  Location: [facility];  Service: Endoscopy;  Laterality: N/A;  bedside  . PEG PLACEMENT N/A 12/07/2017   Procedure: PERCUTANEOUS ENDOSCOPIC GASTROSTOMY (PEG) PLACEMENT;  Surgeon: Georganna Skeans, MD;  Location: [facility];  Service: Endoscopy;  Laterality: N/A;    Current Outpatient Rx  . Order #: [id number] Class: Normal    . Order #: [id number] Class: Normal  . Order #: [id number] Class: Normal  . Order #: [id number] Class: Normal  . Order #: [id number] Class: Normal  . Order #: [id number] Class: Normal  . Order #: [id number] Class: Normal  . Order #: [id number] Class: Normal  . Order #: [id number] Class: Normal  . Order #: [id number] Class: Normal  . Order #: [id number] Class: Normal  . Order #: [id number] Class: Print  . Order #: [id number] Class: Normal  . Order #: [id number] Class: Historical Med  . Order #: [id number] Class: Normal  . Order #: [id number] Class: Print    Allergies Patient has no known allergies.  Family History  Problem Relation Age of Onset  . Alcohol abuse Father   . Liver cancer Father   . Diabetes Father     Social History Social History   Tobacco Use  . Smoking status: Never Smoker  . Smokeless tobacco: Never Used  Substance Use Topics  . Alcohol use: Yes    Alcohol/week: 0.0 oz    Comment: Beer rarely.  . Drug use: No    Comment: former cocaine abuse, used marjuana 1999    Review of Systems  Level 5 caveat: Cardiac arrest  ____________________________________________   PHYSICAL EXAM:  VITAL SIGNS: ED Triage Vitals  Enc Vitals Group     BP --      Pulse Rate January 04, 2018 1105 (!) 0     Weight 2018/01/04 1105 170 lb (77.1 kg)     Height 2018/01/04 1105 5\' 7"  (1.702 m)    Constitutional: GCS 3. Active CPR in progress.  Eyes: Conjunctivae are normal. Pupils are fixed.  Head: Atraumatic. Nose: No congestion/rhinnorhea. Mouth/Throat: Mucous membranes are dry.  Neck: No stridor. Cardiovascular: PEA arrest with CPR in progress. Cool extremities to touch.  Respiratory: Ventilated through 7.5 ETT in trach stoma. Easy to bag vent.  Gastrointestinal: Mild distension.  Musculoskeletal: Cool extremities. No deformity.  Neurologic: Unresponsive.  Skin:  Skin is cool, dry and intact. No rash  noted.  ____________________________________________  RADIOLOGY  None ____________________________________________   PROCEDURES  Procedure(s) performed:   .Critical Care Performed by: Margette Fast, MD Authorized by: Margette Fast, MD   Critical care provider statement:    Critical care time (minutes):  10   Critical care time was exclusive of:  Separately billable procedures and treating other patients and teaching time   Critical care was necessary to treat or prevent imminent or life-threatening deterioration of the following conditions:  Circulatory failure and cardiac failure   Critical care was time spent personally by me on the following activities:  Development of treatment plan with patient or surrogate, evaluation of patient's response to treatment, examination of patient, obtaining history from patient or surrogate, ordering and performing treatments and interventions, pulse oximetry, re-evaluation of patient's condition, review of old charts and vascular access procedures   I assumed direction of critical care for this patient from another provider in my specialty: no      Cardiopulmonary Resuscitation (CPR) Procedure Note Directed/Performed by: Margette Fast I personally directed ancillary staff and/or performed CPR in an effort to regain return of spontaneous circulation and to maintain cardiac, neuro and systemic  perfusion.    ____________________________________________   INITIAL IMPRESSION / ASSESSMENT AND PLAN / ED COURSE  Pertinent labs & imaging results that were available during my care of the patient were reviewed by me and considered in my medical decision making (see chart for details).  The patient arrived in cardiac arrest.  He had cool extremities.  He was easy to bag ventilate from the ET tube and the patient's tracheostomy.  No significant secretions or bleeding on arrival to the emergency department.  We continued CPR with additional epinephrine  given x2 along with bicarb.  The patient remained in PEA arrest.  The patient received approximately 25 minutes of CPR in the emergency department.  In total he had had CPR for approximately 65 minutes.  Performed a bedside ultrasound which showed cardiac standstill with no large effusion.  At that time, I felt that it was medically futile to continue and pronounced the patient dead at 11:14 AM.   11:59 AM Spoke with the [employer]. After discussion this will not be an ME case. Updated wife and son regarding the treatment in the ED and the patient's death. Chaplin at bedside for support.   12:20 PM Spoke with the IM teaching service. They will complete the death certificate. Faxed to [phone].  ____________________________________________  FINAL CLINICAL IMPRESSION(S) / ED DIAGNOSES  Final diagnoses:  Cardiac arrest ([place])     MEDICATIONS GIVEN DURING THIS VISIT:  Medications  sodium bicarbonate injection 50 mEq (not administered)  sodium bicarbonate injection (100 mEq Intravenous Given December 28, 2017 1104)  EPINEPHrine (ADRENALIN) 1 MG/10ML injection (1 Syringe Intravenous Given 12-28-17 1109)     Note:  This document was prepared using Dragon voice recognition software and may include unintentional dictation errors.  Nanda Quinton, MD Emergency Medicine    Long, [facility], MD 2017/12/28 1239

## 2018-01-12 NOTE — ED Notes (Signed)
Patient belongings (boots) sent down to morgue with patient. Family notified and states they do not want them

## 2018-01-12 NOTE — ED Triage Notes (Signed)
Per [facility], Pt is coming from [facility] where he was being taken care of secondary to a stroke in January. Pt had Tracheostomy in place, but when EMS arrived, pt had blood and dark sputum suction from the trach. He was noted to be in PEA and they started CPR at 1010. Pt had return of ROSC at 1033 and then was noted to return to PEA. Pt was given a total of 10 Epi during transport. EMS placed a 7.0 Tube in his tracheostomy site. Hx of Diabetes.

## 2018-01-12 NOTE — Progress Notes (Signed)
Accompany family to see deceased.    01/08/18 1346  Clinical Encounter Type  Visited With Family;Health care provider  Visit Type Follow-up;Spiritual support  Spiritual Encounters  Spiritual Needs Ritual;Emotional;Grief support

## 2018-01-12 DEATH — deceased
